# Patient Record
Sex: Female | Born: 1940 | Race: White | Hispanic: No | Marital: Married | Smoking: Former smoker
Health system: Southern US, Community
[De-identification: ages and names within clinical notes are randomized; demographics above are authoritative.]

## PROBLEM LIST (undated history)

## (undated) DIAGNOSIS — E119 Type 2 diabetes mellitus without complications: Secondary | ICD-10-CM

## (undated) DIAGNOSIS — F329 Major depressive disorder, single episode, unspecified: Secondary | ICD-10-CM

## (undated) DIAGNOSIS — R0602 Shortness of breath: Secondary | ICD-10-CM

## (undated) DIAGNOSIS — E785 Hyperlipidemia, unspecified: Secondary | ICD-10-CM

## (undated) DIAGNOSIS — R5383 Other fatigue: Secondary | ICD-10-CM

## (undated) DIAGNOSIS — I2510 Atherosclerotic heart disease of native coronary artery without angina pectoris: Secondary | ICD-10-CM

## (undated) DIAGNOSIS — J449 Chronic obstructive pulmonary disease, unspecified: Secondary | ICD-10-CM

## (undated) DIAGNOSIS — F32A Depression, unspecified: Secondary | ICD-10-CM

## (undated) DIAGNOSIS — I219 Acute myocardial infarction, unspecified: Secondary | ICD-10-CM

## (undated) DIAGNOSIS — Z720 Tobacco use: Secondary | ICD-10-CM

## (undated) DIAGNOSIS — R011 Cardiac murmur, unspecified: Secondary | ICD-10-CM

## (undated) DIAGNOSIS — I6529 Occlusion and stenosis of unspecified carotid artery: Secondary | ICD-10-CM

## (undated) DIAGNOSIS — J069 Acute upper respiratory infection, unspecified: Secondary | ICD-10-CM

## (undated) DIAGNOSIS — I10 Essential (primary) hypertension: Secondary | ICD-10-CM

## (undated) HISTORY — DX: Hyperlipidemia, unspecified: E78.5

## (undated) HISTORY — DX: Other fatigue: R53.83

## (undated) HISTORY — PX: VAGINAL HYSTERECTOMY: SUR661

## (undated) HISTORY — DX: Essential (primary) hypertension: I10

## (undated) HISTORY — DX: Tobacco use: Z72.0

## (undated) HISTORY — DX: Chronic obstructive pulmonary disease, unspecified: J44.9

## (undated) HISTORY — PX: CORONARY STENT PLACEMENT: SHX1402

## (undated) HISTORY — PX: OTHER SURGICAL HISTORY: SHX169

## (undated) HISTORY — DX: Atherosclerotic heart disease of native coronary artery without angina pectoris: I25.10

---

## 1999-11-17 ENCOUNTER — Encounter: Admission: RE | Admit: 1999-11-17 | Discharge: 1999-11-17 | Payer: Self-pay | Admitting: Internal Medicine

## 1999-11-17 ENCOUNTER — Encounter: Payer: Self-pay | Admitting: Internal Medicine

## 1999-11-29 ENCOUNTER — Ambulatory Visit (HOSPITAL_COMMUNITY): Admission: RE | Admit: 1999-11-29 | Discharge: 1999-11-29 | Payer: Self-pay | Admitting: Internal Medicine

## 2003-10-14 ENCOUNTER — Ambulatory Visit (HOSPITAL_COMMUNITY): Admission: RE | Admit: 2003-10-14 | Discharge: 2003-10-14 | Payer: Self-pay | Admitting: Internal Medicine

## 2004-07-14 ENCOUNTER — Inpatient Hospital Stay (HOSPITAL_COMMUNITY): Admission: EM | Admit: 2004-07-14 | Discharge: 2004-07-18 | Payer: Self-pay | Admitting: Emergency Medicine

## 2004-08-02 ENCOUNTER — Encounter: Admission: RE | Admit: 2004-08-02 | Discharge: 2004-08-02 | Payer: Self-pay | Admitting: Internal Medicine

## 2004-08-03 ENCOUNTER — Ambulatory Visit: Payer: Self-pay | Admitting: Cardiology

## 2004-08-16 ENCOUNTER — Ambulatory Visit: Payer: Self-pay | Admitting: Cardiology

## 2004-09-14 ENCOUNTER — Ambulatory Visit: Payer: Self-pay

## 2004-10-18 ENCOUNTER — Ambulatory Visit: Payer: Self-pay

## 2004-10-21 ENCOUNTER — Ambulatory Visit: Payer: Self-pay | Admitting: Internal Medicine

## 2004-10-22 ENCOUNTER — Ambulatory Visit: Payer: Self-pay | Admitting: Cardiology

## 2004-11-22 ENCOUNTER — Ambulatory Visit: Payer: Self-pay

## 2004-11-24 ENCOUNTER — Ambulatory Visit: Payer: Self-pay

## 2004-11-24 ENCOUNTER — Inpatient Hospital Stay (HOSPITAL_COMMUNITY): Admission: AD | Admit: 2004-11-24 | Discharge: 2004-11-26 | Payer: Self-pay | Admitting: Cardiology

## 2004-11-24 ENCOUNTER — Ambulatory Visit: Payer: Self-pay | Admitting: Cardiology

## 2004-11-25 HISTORY — PX: OTHER SURGICAL HISTORY: SHX169

## 2004-12-09 ENCOUNTER — Ambulatory Visit: Payer: Self-pay | Admitting: Internal Medicine

## 2005-01-18 ENCOUNTER — Ambulatory Visit: Payer: Self-pay | Admitting: Cardiology

## 2005-05-12 ENCOUNTER — Ambulatory Visit: Payer: Self-pay | Admitting: Internal Medicine

## 2005-06-28 ENCOUNTER — Ambulatory Visit: Payer: Self-pay | Admitting: *Deleted

## 2005-09-13 ENCOUNTER — Ambulatory Visit: Payer: Self-pay | Admitting: Internal Medicine

## 2005-09-29 ENCOUNTER — Ambulatory Visit: Payer: Self-pay | Admitting: *Deleted

## 2005-10-06 ENCOUNTER — Ambulatory Visit (HOSPITAL_COMMUNITY): Admission: RE | Admit: 2005-10-06 | Discharge: 2005-10-06 | Payer: Self-pay | Admitting: Internal Medicine

## 2006-05-02 ENCOUNTER — Encounter: Admission: RE | Admit: 2006-05-02 | Discharge: 2006-05-02 | Payer: Self-pay | Admitting: Internal Medicine

## 2006-08-11 ENCOUNTER — Ambulatory Visit: Payer: Self-pay | Admitting: Internal Medicine

## 2006-08-17 ENCOUNTER — Ambulatory Visit: Payer: Self-pay | Admitting: Internal Medicine

## 2006-09-08 ENCOUNTER — Ambulatory Visit: Payer: Self-pay | Admitting: Internal Medicine

## 2006-10-09 ENCOUNTER — Ambulatory Visit (HOSPITAL_COMMUNITY): Admission: RE | Admit: 2006-10-09 | Discharge: 2006-10-09 | Payer: Self-pay | Admitting: Internal Medicine

## 2006-10-09 ENCOUNTER — Ambulatory Visit: Payer: Self-pay | Admitting: Internal Medicine

## 2006-10-18 ENCOUNTER — Encounter: Admission: RE | Admit: 2006-10-18 | Discharge: 2006-10-18 | Payer: Self-pay | Admitting: Internal Medicine

## 2007-02-27 ENCOUNTER — Ambulatory Visit: Payer: Self-pay | Admitting: Internal Medicine

## 2007-05-22 ENCOUNTER — Encounter: Admission: RE | Admit: 2007-05-22 | Discharge: 2007-05-22 | Payer: Self-pay | Admitting: Internal Medicine

## 2007-08-07 ENCOUNTER — Ambulatory Visit: Payer: Self-pay | Admitting: Internal Medicine

## 2007-09-14 ENCOUNTER — Ambulatory Visit: Payer: Self-pay

## 2007-09-14 ENCOUNTER — Encounter: Payer: Self-pay | Admitting: Internal Medicine

## 2007-10-31 ENCOUNTER — Ambulatory Visit (HOSPITAL_COMMUNITY): Admission: RE | Admit: 2007-10-31 | Discharge: 2007-10-31 | Payer: Self-pay | Admitting: Internal Medicine

## 2008-06-06 ENCOUNTER — Ambulatory Visit: Payer: Self-pay | Admitting: Internal Medicine

## 2008-11-13 ENCOUNTER — Ambulatory Visit (HOSPITAL_COMMUNITY): Admission: RE | Admit: 2008-11-13 | Discharge: 2008-11-13 | Payer: Self-pay | Admitting: Internal Medicine

## 2009-04-07 ENCOUNTER — Encounter: Payer: Self-pay | Admitting: Internal Medicine

## 2009-04-09 DIAGNOSIS — F17201 Nicotine dependence, unspecified, in remission: Secondary | ICD-10-CM

## 2009-04-09 DIAGNOSIS — J449 Chronic obstructive pulmonary disease, unspecified: Secondary | ICD-10-CM | POA: Insufficient documentation

## 2009-04-09 DIAGNOSIS — E785 Hyperlipidemia, unspecified: Secondary | ICD-10-CM

## 2009-04-09 DIAGNOSIS — I10 Essential (primary) hypertension: Secondary | ICD-10-CM

## 2009-04-09 DIAGNOSIS — I2510 Atherosclerotic heart disease of native coronary artery without angina pectoris: Secondary | ICD-10-CM

## 2009-04-10 ENCOUNTER — Ambulatory Visit: Payer: Self-pay | Admitting: Cardiology

## 2009-04-10 ENCOUNTER — Encounter: Payer: Self-pay | Admitting: Nurse Practitioner

## 2009-04-10 DIAGNOSIS — R5383 Other fatigue: Secondary | ICD-10-CM

## 2009-04-10 DIAGNOSIS — R5381 Other malaise: Secondary | ICD-10-CM

## 2009-04-10 DIAGNOSIS — R0602 Shortness of breath: Secondary | ICD-10-CM

## 2009-04-15 ENCOUNTER — Telehealth (INDEPENDENT_AMBULATORY_CARE_PROVIDER_SITE_OTHER): Payer: Self-pay

## 2009-04-16 ENCOUNTER — Encounter: Payer: Self-pay | Admitting: Internal Medicine

## 2009-04-16 ENCOUNTER — Ambulatory Visit: Payer: Self-pay

## 2009-04-16 LAB — CONVERTED CEMR LAB
Basophils Absolute: 0 10*3/uL (ref 0.0–0.1)
CO2: 33 meq/L — ABNORMAL HIGH (ref 19–32)
Chloride: 100 meq/L (ref 96–112)
Eosinophils Absolute: 0.1 10*3/uL (ref 0.0–0.7)
GFR calc non Af Amer: 76.38 mL/min (ref >60)
Glucose, Bld: 148 mg/dL — ABNORMAL HIGH (ref 70–99)
Hemoglobin: 16 g/dL — ABNORMAL HIGH (ref 12.0–15.0)
Lymphocytes Relative: 27.7 % (ref 12.0–46.0)
Lymphs Abs: 2.1 10*3/uL (ref 0.7–4.0)
Neutro Abs: 4.6 10*3/uL (ref 1.4–7.7)
Neutrophils Relative %: 60.8 % (ref 43.0–77.0)
Platelets: 185 10*3/uL (ref 150.0–400.0)
Potassium: 4.3 meq/L (ref 3.5–5.1)
RDW: 11.5 % (ref 11.5–14.6)
TSH: 1.1 microintl units/mL (ref 0.35–5.50)
WBC: 7.5 10*3/uL (ref 4.5–10.5)

## 2009-04-27 ENCOUNTER — Encounter: Admission: RE | Admit: 2009-04-27 | Discharge: 2009-04-27 | Payer: Self-pay | Admitting: Internal Medicine

## 2009-05-05 ENCOUNTER — Telehealth: Payer: Self-pay | Admitting: Internal Medicine

## 2009-06-15 ENCOUNTER — Ambulatory Visit: Payer: Self-pay | Admitting: Internal Medicine

## 2009-07-27 ENCOUNTER — Telehealth: Payer: Self-pay | Admitting: Internal Medicine

## 2009-09-22 ENCOUNTER — Encounter: Payer: Self-pay | Admitting: Internal Medicine

## 2009-09-22 DIAGNOSIS — I6529 Occlusion and stenosis of unspecified carotid artery: Secondary | ICD-10-CM

## 2009-09-23 ENCOUNTER — Encounter: Payer: Self-pay | Admitting: Cardiovascular Disease

## 2009-09-23 ENCOUNTER — Ambulatory Visit: Payer: Self-pay | Admitting: Internal Medicine

## 2009-09-24 ENCOUNTER — Encounter: Admission: RE | Admit: 2009-09-24 | Discharge: 2009-09-24 | Payer: Self-pay | Admitting: Internal Medicine

## 2009-10-03 HISTORY — PX: CARDIAC CATHETERIZATION: SHX172

## 2009-10-08 ENCOUNTER — Encounter: Admission: RE | Admit: 2009-10-08 | Discharge: 2009-10-08 | Payer: Self-pay | Admitting: Internal Medicine

## 2009-11-27 ENCOUNTER — Telehealth (INDEPENDENT_AMBULATORY_CARE_PROVIDER_SITE_OTHER): Payer: Self-pay | Admitting: *Deleted

## 2009-12-23 ENCOUNTER — Ambulatory Visit (HOSPITAL_COMMUNITY): Admission: RE | Admit: 2009-12-23 | Discharge: 2009-12-23 | Payer: Self-pay | Admitting: Internal Medicine

## 2010-03-12 ENCOUNTER — Telehealth (INDEPENDENT_AMBULATORY_CARE_PROVIDER_SITE_OTHER): Payer: Self-pay | Admitting: *Deleted

## 2010-04-22 ENCOUNTER — Encounter: Admission: RE | Admit: 2010-04-22 | Discharge: 2010-04-22 | Payer: Self-pay | Admitting: Neurosurgery

## 2010-07-06 ENCOUNTER — Ambulatory Visit: Payer: Self-pay | Admitting: Internal Medicine

## 2010-07-06 DIAGNOSIS — R072 Precordial pain: Secondary | ICD-10-CM

## 2010-07-07 LAB — CONVERTED CEMR LAB
Basophils Absolute: 0 10*3/uL (ref 0.0–0.1)
Basophils Relative: 0.4 % (ref 0.0–3.0)
CO2: 33 meq/L — ABNORMAL HIGH (ref 19–32)
Calcium: 9.9 mg/dL (ref 8.4–10.5)
Chloride: 104 meq/L (ref 96–112)
Eosinophils Relative: 2 % (ref 0.0–5.0)
HCT: 42.3 % (ref 36.0–46.0)
Hemoglobin: 14.6 g/dL (ref 12.0–15.0)
INR: 0.9 (ref 0.8–1.0)
Lymphocytes Relative: 27.2 % (ref 12.0–46.0)
MCV: 100.7 fL — ABNORMAL HIGH (ref 78.0–100.0)
Monocytes Relative: 7.5 % (ref 3.0–12.0)
Platelets: 216 10*3/uL (ref 150.0–400.0)
Prothrombin Time: 9.5 s — ABNORMAL LOW (ref 9.7–11.8)
Sodium: 144 meq/L (ref 135–145)

## 2010-07-09 ENCOUNTER — Ambulatory Visit: Payer: Self-pay | Admitting: Internal Medicine

## 2010-07-09 ENCOUNTER — Inpatient Hospital Stay (HOSPITAL_BASED_OUTPATIENT_CLINIC_OR_DEPARTMENT_OTHER): Admission: RE | Admit: 2010-07-09 | Discharge: 2010-07-09 | Payer: Self-pay | Admitting: Internal Medicine

## 2010-07-20 ENCOUNTER — Telehealth: Payer: Self-pay | Admitting: Internal Medicine

## 2010-07-27 ENCOUNTER — Encounter: Payer: Self-pay | Admitting: Physician Assistant

## 2010-07-28 ENCOUNTER — Ambulatory Visit: Payer: Self-pay | Admitting: Internal Medicine

## 2010-08-23 ENCOUNTER — Ambulatory Visit: Payer: Self-pay | Admitting: Internal Medicine

## 2010-09-06 ENCOUNTER — Ambulatory Visit: Payer: Self-pay | Admitting: Internal Medicine

## 2010-09-06 LAB — PULMONARY FUNCTION TEST

## 2010-09-13 ENCOUNTER — Telehealth (INDEPENDENT_AMBULATORY_CARE_PROVIDER_SITE_OTHER): Payer: Self-pay | Admitting: *Deleted

## 2010-09-23 ENCOUNTER — Ambulatory Visit: Payer: Self-pay | Admitting: Internal Medicine

## 2010-10-02 ENCOUNTER — Telehealth: Payer: Self-pay | Admitting: Internal Medicine

## 2010-10-02 ENCOUNTER — Encounter: Payer: Self-pay | Admitting: Internal Medicine

## 2010-10-02 ENCOUNTER — Encounter: Payer: Self-pay | Admitting: Critical Care Medicine

## 2010-10-06 ENCOUNTER — Telehealth (INDEPENDENT_AMBULATORY_CARE_PROVIDER_SITE_OTHER): Payer: Self-pay | Admitting: *Deleted

## 2010-10-07 ENCOUNTER — Ambulatory Visit
Admission: RE | Admit: 2010-10-07 | Discharge: 2010-10-07 | Payer: Self-pay | Source: Home / Self Care | Attending: Internal Medicine | Admitting: Internal Medicine

## 2010-10-14 ENCOUNTER — Ambulatory Visit
Admission: RE | Admit: 2010-10-14 | Discharge: 2010-10-14 | Payer: Self-pay | Source: Home / Self Care | Attending: Internal Medicine | Admitting: Internal Medicine

## 2010-10-20 ENCOUNTER — Ambulatory Visit: Admission: RE | Admit: 2010-10-20 | Discharge: 2010-10-20 | Payer: Self-pay | Source: Home / Self Care

## 2010-10-20 ENCOUNTER — Encounter: Payer: Self-pay | Admitting: Internal Medicine

## 2010-10-23 ENCOUNTER — Encounter: Payer: Self-pay | Admitting: Internal Medicine

## 2010-10-26 ENCOUNTER — Encounter: Payer: Self-pay | Admitting: Internal Medicine

## 2010-11-02 NOTE — Letter (Signed)
Summary: Cardiac Catheterization Instructions- [facility]  [employer], Main Office  [address]   [place], [place] [address]   Phone: [phone]  Fax: [phone]     07/06/2010 MRN: [id number]  [facility] [address] [place], [place]  [address]  Dear Ms. Tomasso,   You are scheduled for a Cardiac Catheterization on Friday 07/09/10 with Dr. Gala Romney  Please arrive to the 1st floor of the Heart and Vascular Center at [facility] at 11:30 am / pm on the day of your procedure. Please do not arrive before 6:30 a.m. Call the Heart and Vascular Center at [phone] if you are unable to make your appointmnet. The Code to get into the parking garage under the building is 0200. Take the elevators to the 1st floor. You must have someone to drive you home. Someone must be with you for the first 24 hours after you arrive home. Please wear clothes that are easy to get on and off and wear slip-on shoes. Do not eat or drink after midnight except water with your medications that morning. Bring all your medications and current insurance cards with you.  _X__ DO NOT take these medications before your procedure:  Do not take Metformin day of cath and for 2 days after  ___ Make sure you take your aspirin.  ___ You may take ALL of your medications with water that morning. ________________________________________________________________________________________________________________________________  ___ DO NOT take ANY medications before your procedure.  ___ Pre-med instructions:  ________________________________________________________________________________________________________________________________  The usual length of stay after your procedure is 2 to 3 hours. This can vary.  If you have any questions, please call the office at the number listed above.   Meredith Staggers, RN

## 2010-11-02 NOTE — Progress Notes (Signed)
   Phone Note Other Incoming   Summary of Call: Release form sent to [employer]. Form sent from [employer]. Initial call taken by: Dixie Dials

## 2010-11-02 NOTE — Assessment & Plan Note (Signed)
Summary: eph.post cath.gd    Visit Type:  Follow-up Primary Provider:  Dr Renae Gloss   History of Present Illness: This is a 70 year old white female patient who underwent cardiac catheterization for further evaluation of some chest pain and back pain that she's been having to rule out anginal equivalent. Cardiac catheterization on July 09, 2010 showed patent stents to the LAD and RCA with nonobstructive disease throughout. It was felt the majority of her symptoms were probably due to COPD and she was counseled to stop smoking. We'll also consider getting pulmonary function tests and a CT scan of her chest to further evaluate. She was sent home on Imdur. The patient said she became extremely dizzy after she took one dose and had to stop it. She said she still continues to have some back aching and becomes completely exhausted after taking a shower and doing her hair. She continues to smoke a half a pack of cigarettes a day. She gets frustrated for not being able to quit. She denies any anterior chest pain, palpitations, dyspnea, dizziness since she's been off them nor, or presyncope. She does have a chronic cough and dyspnea on exertion related to her smoking.  Current Medications (verified): 1)  Vytorin 10-40 Mg Tabs (Ezetimibe-Simvastatin) .... Take One Tablet By Mouth Dailyat Bedtime 2)  Metoprolol Succinate 25 Mg Xr24h-Tab (Metoprolol Succinate) .... Take One-Half Tablet By Mouth Daily As Needed 3)  Plavix 75 Mg Tabs (Clopidogrel Bisulfate) .... Take One Tablet By Mouth Daily 4)  Aspirin 81 Mg Tbec (Aspirin) .... Take One Tablet By Mouth Daily 5)  Spiriva Handihaler 18 Mcg Caps (Tiotropium Bromide Monohydrate) .... Uad 6)  Klonopin 0.5 Mg Tabs (Clonazepam) .... Take One Tablet By Mouth Once Daily. 7)  Nitrostat 0.4 Mg Subl (Nitroglycerin) .Marland Kitchen.. 1 Tab As Needed For Chest Pain 8)  Metformin Hcl 500 Mg Tabs (Metformin Hcl) .... Take 1 Tablet By Mouth Once A Day 9)  Advair Diskus 250-50 Mcg/dose  Aepb (Fluticasone-Salmeterol) .... As Needed  Allergies: 1)  ! Codeine  Past History:  Past Medical History: Last updated: 04/10/2009 CAD (ICD-414.00)      a. 07/2004 - NSTEMI - DES TO RCA      b. 11/2004 - DES TO LAD HYPERTENSION (ICD-401.9) HYPERLIPIDEMIA (ICD-272.4) COPD (ICD-496) ONGOING TOBACCO ABUSE (ICD-305.1) FATIGUE  Past Surgical History: Last updated: 04/09/2009  Drug-eluting stent placement in the mid right coronary artery. Hysterectomy  Coronary angiography/CYPHER stenting, left anterior descending, November 25, 2004.  Review of Systems       see the history of present illness  Vital Signs:  Patient profile:   70 year old female Height:      61 inches Weight:      117 pounds Pulse rate:   76 / minute Pulse rhythm:   regular BP sitting:   122 / 78  (left arm)  Physical Exam  General:   Well-nournished, in no acute distress. Neck: No JVD, HJR, Bruit, or thyroid enlargement Lungs: Decreased BS throughout,No tachypnea, clear without wheezing, rales, or rhonchi Cardiovascular: RRR, PMI not displaced, heart sounds normal, no murmurs, gallops, bruit, thrill, or heave. Abdomen: BS normal. Soft without organomegaly, masses, lesions or tenderness. Extremities: right carotid hematoma or hemorrhage, lower extremities without cyanosis, clubbing or edema. Good distal pulses bilateral SKin: Warm, no lesions or rashes  Musculoskeletal: No deformities Neuro: no focal signs    Impression & Recommendations:  Problem # 1:  CHEST PAIN, PRECORDIAL (ICD-786.51) Patient continues to have some pain in her back and  exhaustion when she over exerts herself. Difficult to discern whether this is an anginal colon or not. Cardiac catheter showed patent stents and nonobstructive disease ejection fraction 45%. We recommend she see her pulmonologist Dr. Jetty Duhamel for further workup with pulmonary function tests and CT scan. The following medications were removed from the  medication list:    Isosorbide Mononitrate Cr 30 Mg Xr24h-tab (Isosorbide mononitrate) .Marland Kitchen... Take one tablet by mouth daily Her updated medication list for this problem includes:    Metoprolol Succinate 25 Mg Xr24h-tab (Metoprolol succinate) .Marland Kitchen... Take one-half tablet by mouth daily as needed    Plavix 75 Mg Tabs (Clopidogrel bisulfate) .Marland Kitchen... Take one tablet by mouth daily    Aspirin 81 Mg Tbec (Aspirin) .Marland Kitchen... Take one tablet by mouth daily    Nitrostat 0.4 Mg Subl (Nitroglycerin) .Marland Kitchen... 1 tab as needed for chest pain  Problem # 2:  CAD (ICD-414.00) stable the above dictation and history of present illness The following medications were removed from the medication list:    Isosorbide Mononitrate Cr 30 Mg Xr24h-tab (Isosorbide mononitrate) .Marland Kitchen... Take one tablet by mouth daily Her updated medication list for this problem includes:    Metoprolol Succinate 25 Mg Xr24h-tab (Metoprolol succinate) .Marland Kitchen... Take one-half tablet by mouth daily as needed    Plavix 75 Mg Tabs (Clopidogrel bisulfate) .Marland Kitchen... Take one tablet by mouth daily    Aspirin 81 Mg Tbec (Aspirin) .Marland Kitchen... Take one tablet by mouth daily    Nitrostat 0.4 Mg Subl (Nitroglycerin) .Marland Kitchen... 1 tab as needed for chest pain  Problem # 3:  HYPERTENSION (ICD-401.9) stable Her updated medication list for this problem includes:    Metoprolol Succinate 25 Mg Xr24h-tab (Metoprolol succinate) .Marland Kitchen... Take one-half tablet by mouth daily as needed    Aspirin 81 Mg Tbec (Aspirin) .Marland Kitchen... Take one tablet by mouth daily  Problem # 4:  TOBACCO ABUSE (ICD-305.1) I counseled the patient on the importance of smoking cessation. Patient is down to half pack of cigarettes a day.  Problem # 5:  COPD (ICD-496) Follow up with Dr. Maple Hudson. Her updated medication list for this problem includes:    Spiriva Handihaler 18 Mcg Caps (Tiotropium bromide monohydrate) ..... Uad    Advair Diskus 250-50 Mcg/dose Aepb (Fluticasone-salmeterol) .Marland Kitchen... As needed  Other Orders: Misc.  Referral (Misc. Ref)  Patient Instructions: 1)  Your physician recommends that you schedule a follow-up appointment in: 2 months with Dr Gala Romney. 2)  Referral to Dr Fannie Knee.

## 2010-11-02 NOTE — Progress Notes (Signed)
Summary: pt needs refill   Phone Note Refill Request Message from:  [facility] [address]  Refills Requested: Medication #1:  PLAVIX 75 MG TABS Take one tablet by mouth daily pt has 2pills  Initial call taken by: Omer Jack,  November 27, 2009 11:20 AM  Follow-up for Phone Call        Rx faxed to pharmacy 90 x1 refill Follow-up by: Oswald Hillock,  November 27, 2009 1:00 PM  Additional Follow-up for Phone Call Additional follow up Details #1::        Rx faxed to pharmacy    Prescriptions: PLAVIX 75 MG TABS (CLOPIDOGREL BISULFATE) Take one tablet by mouth daily  #90 x 1   Entered by:   Oswald Hillock   Authorized by:   Dolores Patty, MD, [facility]   Signed by:   Oswald Hillock on 11/27/2009   Method used:   [facility]  [address] [id number]       [address]       [place], [place]  [address]       Ph: [phone] or [phone]       Fax: [phone]   RxID:   [id number]

## 2010-11-02 NOTE — Miscellaneous (Signed)
Summary: Orders Update pft charges  Clinical Lists Changes  Orders: Added new Service order of Carbon Monoxide diffusing w/capacity (94720) - Signed Added new Service order of Lung Volumes (94240) - Signed Added new Service order of Spirometry (Pre & Post) (94060) - Signed 

## 2010-11-02 NOTE — Miscellaneous (Signed)
  Clinical Lists Changes  Observations: Added new observation of CARDCATHFIND:  Right coronary artery was a dominant vessel, gave off PDA and a small   posterolateral.  There was a 40% lesion proximally followed by an area   of stenting.  There was just mild luminal plaquing in the stent. Through   the stent, there was a 40% lesion in the midsection.  There was diffuse   30% plaquing in the PDA.      Left ventriculogram done in the RAO position showed an EF of 40-45% with   akinesis of the basilar to mid inferior wall.  No significant mitral   regurgitation.      ASSESSMENT:   1. Coronary artery disease with patent stents as described above.   2. Left ventricular ejection fraction approximately 45%.      PLAN/DISCUSSION:  I suspect the majority of her symptoms are due to her   COPD.  I have counseled her strongly about the need to stop smoking.  We   will consider getting PFTs and a CT scan of the chest to further   evaluate.  We will treat her coronary disease medically.               [name], MD  (07/10/2010 9:49)      Cardiac Cath  Procedure date:  07/10/2010  Findings:       Right coronary artery was a dominant vessel, gave off PDA and a small   posterolateral.  There was a 40% lesion proximally followed by an area   of stenting.  There was just mild luminal plaquing in the stent. Through   the stent, there was a 40% lesion in the midsection.  There was diffuse   30% plaquing in the PDA.      Left ventriculogram done in the RAO position showed an EF of 40-45% with   akinesis of the basilar to mid inferior wall.  No significant mitral   regurgitation.      ASSESSMENT:   1. Coronary artery disease with patent stents as described above.   2. Left ventricular ejection fraction approximately 45%.      PLAN/DISCUSSION:  I suspect the majority of her symptoms are due to her   COPD.  I have counseled her strongly about the need to stop smoking.  We   will  consider getting PFTs and a CT scan of the chest to further   evaluate.  We will treat her coronary disease medically.               [name], MD

## 2010-11-02 NOTE — Assessment & Plan Note (Signed)
Summary: f1y  Medications Added METFORMIN HCL 500 MG TABS (METFORMIN HCL) Take 1 tablet by mouth once a day ADVAIR DISKUS 250-50 MCG/DOSE AEPB (FLUTICASONE-SALMETEROL) as needed ISOSORBIDE MONONITRATE CR 30 MG XR24H-TAB (ISOSORBIDE MONONITRATE) Take one tablet by mouth daily      Allergies Added:   Visit Type:  Follow-up Primary Provider:  Dr Renae Gloss  CC:  pain between shoulder blades - relief with NTG.  History of Present Illness: [name] is 70 year old Caucasian female with prior history of HTN, HL, COPD and CAD status post stenting of right coronary artery in 2005 and LAD in 2006.  Was seen in July 2010 by Ward Givens and was having epigastric discomfort radiating to scapula. Had Myoview EF 55% with normal perfusion.  Returns for f/u.  Continues to have pain between her shoulder blades. Can happen at any time. Feels like it is more frequent. Not worse with exertion, though. Has now had to take NTG 2x and it has helped. Does notice that she is getting more short ot breath with activity.   Still smoking 3/4 ppd.  Current Medications (verified): 1)  Vytorin 10-40 Mg Tabs (Ezetimibe-Simvastatin) .... Take One Tablet By Mouth Dailyat Bedtime 2)  Metoprolol Succinate 25 Mg Xr24h-Tab (Metoprolol Succinate) .... Take One-Half Tablet By Mouth Daily As Needed 3)  Plavix 75 Mg Tabs (Clopidogrel Bisulfate) .... Take One Tablet By Mouth Daily 4)  Aspirin 81 Mg Tbec (Aspirin) .... Take One Tablet By Mouth Daily 5)  Spiriva Handihaler 18 Mcg Caps (Tiotropium Bromide Monohydrate) .... Uad 6)  Klonopin 0.5 Mg Tabs (Clonazepam) .... Take One Tablet By Mouth Once Daily. 7)  Nitrostat 0.4 Mg Subl (Nitroglycerin) .Marland Kitchen.. 1 Tab As Needed For Chest Pain 8)  Metformin Hcl 500 Mg Tabs (Metformin Hcl) .... Take 1 Tablet By Mouth Once A Day 9)  Advair Diskus 250-50 Mcg/dose Aepb (Fluticasone-Salmeterol) .... As Needed  Allergies (verified): 1)  ! Codeine  Past History:  Past Medical History: Last  updated: 04/10/2009 CAD (ICD-414.00)      a. 07/2004 - NSTEMI - DES TO RCA      b. 11/2004 - DES TO LAD HYPERTENSION (ICD-401.9) HYPERLIPIDEMIA (ICD-272.4) COPD (ICD-496) ONGOING TOBACCO ABUSE (ICD-305.1) FATIGUE  Review of Systems       As per HPI and past medical history; otherwise all systems negative.   Vital Signs:  Patient profile:   70 year old female Height:      61 inches Weight:      118 pounds BMI:     22.38 Pulse rate:   71 / minute BP sitting:   120 / 78  (left arm) Cuff size:   regular  Vitals Entered By: Hardin Negus, RMA (July 06, 2010 11:43 AM)  Physical Exam  General:  Looks like a chronic smoker. + coughno acute distress. no resp difficulty HEENT: normal Neck: supple. no JVD. Carotids 2+ bilat; no  bruits. No lymphadenopathy or thryomegaly appreciated. Cor: PMI nonpalpable. Distant Regular rate & rhythm. No rubs, gallops, murmur. Lungs: clear  with decreased air movement Abdomen: soft, nontender, nondistended. Good bowel sounds. Extremities: no cyanosis, clubbing, rash, edema Neuro: alert & orientedx3, cranial nerves grossly intact. moves all 4 extremities w/o difficulty. affect pleasant    Impression & Recommendations:  Problem # 1:  CAD (ICD-414.00) Recurrent CP concerning for angina. Now 5 years out from previous cath and still smoking so at high risk. We discussed repeart stress test vs cath. I have suggested cath and she agrees to proceed. She  will d/w her husband. Start Imdur 30. Call 911 if symptoms getting worse.  Problem # 2:  TOBACCO ABUSE (ICD-305.1) Counseled on need to stop smoking.   Other Orders: EKG w/ Interpretation (93000) Cardiac Catheterization (Cardiac Cath) TLB-BMP (Basic Metabolic Panel-BMET) (80048-METABOL) TLB-CBC Platelet - w/Differential (85025-CBCD) TLB-PT (Protime) (85610-PTP)  Patient Instructions: 1)  Labs today 2)  Imdur 30mg  daily 3)  Your physician has requested that you have a cardiac catheterization.   Cardiac catheterization is used to diagnose and/or treat various heart conditions. Doctors may recommend this procedure for a number of different reasons. The most common reason is to evaluate chest pain. Chest pain can be a symptom of coronary artery disease (CAD), and cardiac catheterization can show whether plaque is narrowing or blocking your heart's arteries. This procedure is also used to evaluate the valves, as well as measure the blood flow and oxygen levels in different parts of your heart.  For further information please visit [email or web address]  Please follow instruction sheet, as given. 4)  Your physician wants you to follow-up in:  6 months.  You will receive a reminder letter in the mail two months in advance. If you don't receive a letter, please call our office to schedule the follow-up appointment. Prescriptions: ISOSORBIDE MONONITRATE CR 30 MG XR24H-TAB (ISOSORBIDE MONONITRATE) Take one tablet by mouth daily  #30 x 6   Entered by:   Meredith Staggers, RN   Authorized by:   Dolores Patty, MD, [facility]   Signed by:   Meredith Staggers, RN on 07/06/2010   Method used:   [facility]  [address] [id number]       [address]       [place], [place]  [address]       Ph: [phone] or [phone]       Fax: [phone]   RxID:   [id number]

## 2010-11-02 NOTE — Assessment & Plan Note (Signed)
Summary: SOB//jwr   Primary [name]/Referring Slyvia Lartigue:  Dr Andi Devon  CC:  Pulmonary Consult pt c/o sob with exertion, pain in Left lower lobe, symptons x 10 months , and cardiac cath 07/09/10.  History of Present Illness: August 23, 2010- Nurse CC: Pulmonary Consult pt c/o sob with exertion, pain in Left lower lobe, symptoms x 10 months , cardiac cath 07/09/10. I had seen her at the old office around 2004 for dx of COPD and qualified her for disability then.  70yoF seen on kind referral by Dr Gala Romney for COPD. Smoker. Says she has had notable shortness of breath and cough since 1998, bu no oxygen and never hospitalised for her lungs. Chest tightness and cough vary with weather. Occasional sputum. sometimes cough wakes her. Never pneumonia. Hadd pneumovax x 2, flu vax. Dyspnea maybe at one block level, less than 1 flight of stairs. Spring and Fall seasons associated with nasal congestion. Denies blood, palpitation. Gets nonexertional pains in left lower lateral chest wall. Still smoking 1/2-1 PPD. Failed nicotine patches and Welbutrin, afraid to try Chantix. Cried when she quit smoking for 4 months once in the past- made emotions labile.  Hx coronary disease with 2 stents.      Preventive Screening-Counseling & Management  Alcohol-Tobacco     Smoking Status: current     Smoking Cessation Counseling: yes     Smoke Cessation Stage: precontemplative     Packs/Day: 1.0     Year Started: 1953     Tobacco Counseling: to quit use of tobacco products  Current Medications (verified): 1)  Vytorin 10-40 Mg Tabs (Ezetimibe-Simvastatin) .... Take One Tablet By Mouth Dailyat Bedtime 2)  Metoprolol Succinate 25 Mg Xr24h-Tab (Metoprolol Succinate) .... Take One-Half Tablet By Mouth Daily As Needed 3)  Plavix 75 Mg Tabs (Clopidogrel Bisulfate) .... Take One Tablet By Mouth Daily 4)  Aspirin 81 Mg Tbec (Aspirin) .... Take One Tablet By Mouth Daily 5)  Spiriva Handihaler 18 Mcg Caps  (Tiotropium Bromide Monohydrate) .... Uad 6)  Klonopin 0.5 Mg Tabs (Clonazepam) .... Take One Tablet By Mouth Once Daily. 7)  Nitrostat 0.4 Mg Subl (Nitroglycerin) .Marland Kitchen.. 1 Tab As Needed For Chest Pain 8)  Metformin Hcl 500 Mg Tabs (Metformin Hcl) .... Take 1 Tablet By Mouth Once A Day 9)  Advair Diskus 250-50 Mcg/dose Aepb (Fluticasone-Salmeterol) .... As Needed  Allergies (verified): 1)  ! Codeine  Past History:  Past Medical History: Last updated: 04/10/2009 CAD (ICD-414.00)      a. 07/2004 - NSTEMI - DES TO RCA      b. 11/2004 - DES TO LAD HYPERTENSION (ICD-401.9) HYPERLIPIDEMIA (ICD-272.4) COPD (ICD-496) ONGOING TOBACCO ABUSE (ICD-305.1) FATIGUE  Family History: Last updated: 08/23/2010  Mother had diabetes, and may have died of a heart attack age 31. Father - died 40 yo old age, Alzheimers  Social History: Last updated: 08/23/2010  She is married.  She started smoking at age 70.  She cont. to smoke about 10 cigarettes/day.  She occasionally drinks wine.  She denies any recreational drugs. 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 Hysterectomy Coronary angiography/CYPHER stenting, left anterior descending, November 25, 2004.  Family History:  Mother had diabetes, and may have died of a heart attack age 84. Father - died 75 yo old age, Alzheimers  Social History:  She is married.  She started smoking  at age 32.  She cont. to smoke about 10 cigarettes/day.  She occasionally drinks wine.  She denies any recreational drugs. 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  The patient denies  shortness of breath at rest, non-productive cough, coughing up blood, chest pain, irregular heartbeats, acid heartburn, indigestion, loss of appetite, weight change, abdominal pain, difficulty swallowing, sore throat, tooth/dental problems, headaches, nasal congestion/difficulty breathing through nose, sneezing, itching, ear ache, anxiety, hand/feet swelling, joint stiffness or pain, rash, change in color of mucus, and fever.    Vital Signs:  Patient profile:   70 year old female Height:      61 inches Weight:      116 pounds BMI:     22.00 O2 Sat:      90 % on Room air Pulse rate:   80 / minute BP sitting:   140 / 80  (left arm)  Vitals Entered By: Renold Genta RCP, LPN (August 23, 2010 2:44 PM)  O2 Flow:  Room air CC: Pulmonary Consult pt c/o sob with exertion, pain in Left lower lobe, symptons x 10 months , cardiac cath 07/09/10 Comments Medications reviewed with patient Renold Genta RCP, LPN  August 23, 2010 2:44 PM    Physical Exam  Additional Exam:  General: A/Ox3; pleasant and cooperative, NAD, trim SKIN: no rash, lesions NODES: no lymphadenopathy HEENT: [place]/AT, EOM- WNL, Conjuctivae- clear, PERRLA, TM-WNL, Nose- clear, Throat- clear and wnl, dentures, Mallampati  II NECK: Supple w/ fair ROM, JVD- none, normal carotid impulses w/o bruits Thyroid- normal to palpation CHEST: diminished, slow. 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  Plan Pulmonary rehab Add rescue inhaler PFT Get report latest CXR [facility]- consider need for CT  Problem # 2:  TOBACCO ABUSE (ICD-305.1)  Smoking counseling, [facility] smoking cessation program referral  Medications Added to Medication List This Visit: 1)   Proair Hfa 108 (90 Base) Mcg/act Aers (Albuterol sulfate) .... 2 puffs  four times a day as needed rescue inhaler  Other Orders: Consultation Level IV (81191) Misc. Referral (Misc. Ref) Rehabilitation Referral (Rehab)  Patient Instructions: 1)  Please schedule a follow-up appointment in 1 month. 2)  Information on Chantix and the [facility] Smoking Cessation program 3)  See [facility] to schedule PFT and for referral information to the Pulmonary Rehab program 4)  Sample and script for Proair rescue inhaler- 5)    2 puffs four times a day as needed  6)  We will track down report of your latest CXR at [facility] Prescriptions: PROAIR HFA 108 (90 BASE) MCG/ACT AERS (ALBUTEROL SULFATE) 2 puffs  four times a day as needed rescue inhaler  #1 x prn   Entered and Authorized by:   Waymon Budge MD   Signed by:   Waymon Budge MD on 08/23/2010   Method used:   Print then Give to Patient   RxID:   [id number]

## 2010-11-02 NOTE — Progress Notes (Signed)
Summary: question on meds   Phone Note Call from Patient Call back at Home Phone [phone] Call back at cell-[phone]   Caller: Patient Reason for Call: Talk to Nurse Summary of Call: pt has question on meds on ISOSORBIDE MONONITRATE CR 30 MG. pt states she getting dizzy when she take the medication. pt is going out of  town tomorrow morning. Initial call taken by: Roe Coombs,  July 20, 2010 2:52 PM  Follow-up for Phone Call        pt can't tolerate Imdur, she says she just feels weird adn dizzy this has been going on since starting the med, she will stop and see how she does, will let Dr Gala Romney know Meredith Staggers, RN  July 20, 2010 5:34 PM

## 2010-11-04 NOTE — Assessment & Plan Note (Signed)
Summary: 2 MONTH/D.MILLER      Allergies Added:   Visit Type:  Follow-up Primary Provider:  Dr Andi Devon   History of Present Illness: [name] is 70 year old female with prior history of HTN, HL, COPD and CAD status post stenting of right coronary artery in 2005 and LAD in 2006.  Underwent cardiac catheterization on July 09, 2010 due to CP, showed patent stents to the LAD and RCA with nonobstructive disease throughout. It was felt the majority of her symptoms were probably due to COPD and she was counseled to stop smoking.  She was sent home on Imdur. The patient said she became extremely dizzy after she took one dose and had to stop it. Referred to pulmonary.  Saw Dr. Maple Hudson. PFTs with seveer COPD:  FEV1 0.59/ 24%. R 0.28. FVC improved some after BD. - 09/06/10- Room Air: 89%, 86%, 92% 288 meters. Dr. Maple Hudson working with her to try to get her to quit smoking. Started on home O2. But only using at night and not with exertion.   Here for f/u. Has been sick with flu recently and starting to get better. Down to smoking about 1 cigarette per day. Continues with a chronic cough and dyspnea on exertion related to her smoking. No significant CP. No HF.   Would like to stop Plavix due to bruising.   Carotid u/s 12/10: R 40-59% L 0-39%. No focal neuro symptoms.   Current Medications (verified): 1)  Vytorin 10-40 Mg Tabs (Ezetimibe-Simvastatin) .... Take One Tablet By Mouth Dailyat Bedtime 2)  Metoprolol Succinate 25 Mg Xr24h-Tab (Metoprolol Succinate) .... Take 1 Tablet By Mouth Once A Day 3)  Plavix 75 Mg Tabs (Clopidogrel Bisulfate) .... Take One Tablet By Mouth Daily 4)  Aspirin 81 Mg Tbec (Aspirin) .... Take One Tablet By Mouth Daily 5)  Spiriva Handihaler 18 Mcg Caps (Tiotropium Bromide Monohydrate) .... Uad 6)  Klonopin 0.5 Mg Tabs (Clonazepam) .... Take One Tablet By Mouth Once Daily. 7)  Nitrostat 0.4 Mg Subl (Nitroglycerin) .Marland Kitchen.. 1 Tab As Needed For Chest Pain 8)  Metformin  Hcl 500 Mg Tabs (Metformin Hcl) .... Take 1 Tablet By Mouth Once A Day 9)  Advair Diskus 250-50 Mcg/dose Aepb (Fluticasone-Salmeterol) .... As Needed 10)  Proair Hfa 108 (90 Base) Mcg/act Aers (Albuterol Sulfate) .... 2 Puffs  Four Times A Day As Needed Rescue Inhaler 11)  Doxycycline Monohydrate 100 Mg  Caps (Doxycycline Monohydrate) .... By Mouth Twice Daily 12)  Oxygen 2 L/ Min, Continuous and Portable.  Allergies (verified): 1)  ! Codeine  Past History:  Past Medical History: Last updated: 09/23/2010 CAD (ICD-414.00)      a. 07/2004 - NSTEMI - DES TO RCA      b. 11/2004 - DES TO LAD HYPERTENSION (ICD-401.9) HYPERLIPIDEMIA (ICD-272.4) COPD (ICD-496)- PFT 09/06/10- FEV1 0.59/ 24%, FVC/FEV1 0.28, DLCO 42%, some response to dilator ONGOING TOBACCO ABUSE (ICD-305.1) FATIGUE  Review of Systems       As per HPI and past medical history; otherwise all systems negative.   Vital Signs:  Patient profile:   70 year old female Height:      61 inches Weight:      113 pounds Pulse rate:   84 / minute Pulse rhythm:   regular BP sitting:   130 / 80  (right arm)  Vitals Entered By: Jacquelin Hawking, CMA (October 07, 2010 1:53 PM)  Physical Exam  General:  Thin.  in no acute distress. Neck: No JVD, HJR, Bruit, or  thyroid enlargement Lungs: Decreased BS throughout, No tachypnea, clear without wheezing, rales, or rhonchi Cardiovascular: Distant.  RRR. no murmurs, gallops, bruit, thrill, or heave. Abdomen: BS normal. Soft without organomegaly, masses, lesions or tenderness. Extremities: right carotid hematoma or hemorrhage, lower extremities without cyanosis, clubbing or edema. Good distal pulses bilateral SKin: Warm, no lesions or rashes  Musculoskeletal: No deformities Neuro: no focal signs    Impression & Recommendations:  Problem # 1:  CAD (ICD-414.00) Stable by recent cath. She is 5+ years out from her last stent. OK to stop Plavix.  Problem # 2:  COPD (ICD-496) Very  severe/end-stage. Following closely with Dr. Maple Hudson. Congratulated her on cutting back on cigarettes and encouraged her to quit completely.   Problem # 3:  CAROTID ARTERY DISEASE (ICD-433.10) Asymptomatic. Continue statin. Due for f/u ultrasound.   Other Orders: EKG w/ Interpretation (93000) Carotid Duplex (Carotid Duplex)  Patient Instructions: 1)  Your physician recommends that you schedule a follow-up appointment in: 9 months with Dr. Gala Romney 2)  Your physician has recommended you make the following change in your medication: Stop Plavix after you finish this bottle. 3)  Your physician has requested that you have a carotid duplex. This test is an ultrasound of the carotid arteries in your neck. It looks at blood flow through these arteries that supply the brain with blood. Allow one hour for this exam. There are no restrictions or special instructions.

## 2010-11-04 NOTE — Assessment & Plan Note (Signed)
Summary: per PW / cj   Primary Provider/Referring Provider:  Dr Andi Devon  CC:  Follow up visit-cold in December;had abx but never took Prednisone.Marland Kitchen  History of Present Illness:  September 23, 2010- COPD, tobacco Nurse-CC: 1 month f/u appt to discuss PFT and 6 min walk test results.  Decreased smoking to 1 cig a day.  breathing has improved. coughing up clear to yellow sputum.  She is going by the [place] to stop smoking. We discussed her cough- using Adviar as needed.  She is in a study trial using Spiriva.  PFT- Severe emphysema. FEV1 0.59/ 24%. R 0.28. FVC improved some after BD. - 09/06/10- Room Air: 89%, 86%, 92% 288 meters  October 14, 2010- COPD, tobacco Nurse-CC: Follow up visit-cold in December;had abx but never took Prednisone. Reviewed cardilogy note. Cath showed unobstructed stents. Our office called in prednisone and doxycycline in December for a bronchits with fever,  but she didn't take the prednisone because it causes mood changes. Started home oxygen for sleep and rarely in day. It has made a great difference - sleeping much better. She no longer aches on waking in AM as she used to. She makes one cigarette last her all day.     Preventive Screening-Counseling & Management  Alcohol-Tobacco     Smoking Status: current     Smoking Cessation Counseling: yes     Smoke Cessation Stage: precontemplative     Packs/Day: approx 1 cig a day     Year Started: 1953     Tobacco Counseling: to quit use of tobacco products  Current Medications (verified): 1)  Vytorin 10-40 Mg Tabs (Ezetimibe-Simvastatin) .... Take One Tablet By Mouth Dailyat Bedtime 2)  Metoprolol Succinate 25 Mg Xr24h-Tab (Metoprolol Succinate) .... Take 1 Tablet By Mouth Once A Day 3)  Aspirin 81 Mg Tbec (Aspirin) .... Take One Tablet By Mouth Daily 4)  Spiriva Handihaler 18 Mcg Caps (Tiotropium Bromide Monohydrate) .... Uad 5)  Klonopin 0.5 Mg Tabs (Clonazepam) .... Take One Tablet By Mouth Once  Daily. 6)  Nitrostat 0.4 Mg Subl (Nitroglycerin) .Marland Kitchen.. 1 Tab As Needed For Chest Pain 7)  Metformin Hcl 500 Mg Tabs (Metformin Hcl) .... Take 1 Tablet By Mouth Once A Day 8)  Advair Diskus 250-50 Mcg/dose Aepb (Fluticasone-Salmeterol) .Marland Kitchen.. 1 Puff Two Times A Day and Rinse Mouth Well 9)  Proair Hfa 108 (90 Base) Mcg/act Aers (Albuterol Sulfate) .... 2 Puffs  Four Times A Day As Needed Rescue Inhaler 10)  Oxygen 2 L/ Min, Continuous and Portable.  Allergies (verified): 1)  ! Codeine  Past History:  Past Medical History: Last updated: 09/23/2010 CAD (ICD-414.00)      a. 07/2004 - NSTEMI - DES TO RCA      b. 11/2004 - DES TO LAD HYPERTENSION (ICD-401.9) HYPERLIPIDEMIA (ICD-272.4) COPD (ICD-496)- PFT 09/06/10- FEV1 0.59/ 24%, FVC/FEV1 0.28, DLCO 42%, some response to dilator ONGOING TOBACCO ABUSE (ICD-305.1) FATIGUE  Past Surgical History: Last updated: 08/23/2010 Drug-eluting stent placement in the mid right coronary artery. Hysterectomy Coronary angiography/CYPHER stenting, left anterior descending, November 25, 2004.  Family History: Last updated: 08/23/2010  Mother had diabetes, and may have died of a heart attack age 71. Father - died 9 yo old age, Alzheimers  Social History: Last updated: 09/23/2010  She is married.  She started smoking at age 73.  She cont. to smoke about 10 cigarettes/day.  currently smoking 1 cig a day.  She occasionally drinks wine.  She denies any recreational drugs. Husband  smokes cigars Married, 3 children Retired Public librarian  Risk Factors: Smoking Status: current (10/14/2010) Packs/Day: approx 1 cig a day (10/14/2010)  Social History: Packs/Day:  approx 1 cig a day  Review of Systems      See HPI       The patient complains of shortness of breath with activity and non-productive cough.  The patient denies shortness of breath at rest, productive cough, coughing up blood, chest pain, irregular heartbeats, acid heartburn, indigestion,  loss of appetite, weight change, abdominal pain, difficulty swallowing, sore throat, tooth/dental problems, headaches, nasal congestion/difficulty breathing through nose, and sneezing.    Vital Signs:  Patient profile:   70 year old female Height:      61 inches Weight:      117.13 pounds BMI:     22.21 O2 Sat:      91 % on Room air Pulse rate:   68 / minute BP sitting:   110 / 58  (right arm) Cuff size:   regular  Vitals Entered By: Reynaldo Minium CMA (October 14, 2010 2:42 PM)  O2 Flow:  Room air CC: Follow up visit-cold in December;had abx but never took Prednisone.   Physical Exam  Additional Exam:  General: A/Ox3; pleasant and cooperative, NAD, trim, talkative SKIN: no rash, lesions NODES: no lymphadenopathy HEENT: [place]/AT, EOM- WNL, Conjuctivae- clear, PERRLA, TM-WNL, Nose- clear, Throat- clear and wnl, dentures, Mallampati  II NECK: Supple w/ fair ROM, JVD- none, normal carotid impulses w/o bruits Thyroid- normal to palpation CHEST: diminished, slow expiratory phase. Congested cough noted only once HEART: RRR, no m/g/r heard ABDOMEN:trim- not obese ZOX:WRUE, nl pulses, no edema  NEURO: Grossly intact to observation      Impression & Recommendations:  Problem # 1:  COPD (ICD-496) She is feeling well by her standards, with cough revealing a persistent bronchitis pattern. She recognizes oxygen dependence at night.  Problem # 2:  TOBACCO ABUSE (ICD-305.1)  It to hard to get too shrill about one cigarette daily, but I still try to make the point with her and her family is encouraging her to stop.   Medications Added to Medication List This Visit: 1)  Advair Diskus 250-50 Mcg/dose Aepb (Fluticasone-salmeterol) .Marland Kitchen.. 1 puff two times a day and rinse mouth well 2)  Oxygen 2 L/ Min, Continuous and Portable.  .... Advanced  Other Orders: Est. Patient Level III (45409)  Patient Instructions: 1)  Keep scheduled February appointment for oxygen documentation. 2)  Try plain  Mucinex otc for a week or so at a time to thin mucus.

## 2010-11-04 NOTE — Progress Notes (Signed)
Summary: Sick call - also request results of ONO  Phone Note Call from Patient   Reason for Call: Acute Illness Summary of Call: pt ill and coughing yellow I called in pred and doxy to her pharmacy she will need OV soon  Initial call taken by: Storm Frisk MD,  October 02, 2010 3:56 PM  Follow-up for Phone Call        called, spoke with pt.  States she is feeling "much better" but did not take prednsione.  Advised PW recs she come in for f/u OV -- pt ok with this.  Ov scheduled with CY for 10/14/10 at 2:45pm.    Pt requesting results of ONO.  States [facility] picked it up yesterday. Results placed on CY's cart along with copy of phone note for him to addres.     Follow-up by: Gweneth Dimitri RN,  October 05, 2010 9:01 AM  Additional Follow-up for Phone Call Additional follow up Details #1::        Her oxygen levels stayed low all night.  I recommend we start her on home O2 and will send the order. Please let her know.  I will review with her at her January appointment.  Additional Follow-up by: Waymon Budge MD,  October 05, 2010 1:20 PM    Additional Follow-up for Phone Call Additional follow up Details #2::    Called and spoke with pt and she is aware of her ono results. I advised her per Dr. Maple Hudson that her o2 levels stayed low all night and that he suggested that she start on home o2 at 2 lpm continuous. Also advised pt that she needed to keep her 10/14/10 appt as per his note. Pt understands that [facility] will contact her today to bring o2 out to her.  We have requested that [facility] evaluate her for a small portable o2 device such as the helios (pt requested liquid o2). Pt is aware of o2 referral and that she needs to keep her January appt with Dr. Maple Hudson. Order faxed to [facility]. Rhonda Cobb  October 05, 2010 1:44 PM   New/Updated Medications: * OXYGEN 2 L/ MIN, CONTINUOUS AND PORTABLE.

## 2010-11-04 NOTE — Progress Notes (Signed)
  Phone Note Other Incoming   Request: Send information Summary of Call: Request for records received from [facility]. Request forwarded to [facility].

## 2010-11-04 NOTE — Progress Notes (Signed)
Summary: oder for O2  Phone Note Call from Patient Call back at Home Phone [phone]   Caller: Patient Summary of Call: Stated they received infomation from [facility] and stated that her O2 was low during the night and that we were going to order O2 for the patient. Patient wants to know if this has been ordered and if so who was it ordered from. Patient can be reached at [phone] Initial call taken by: Vedia Coffer,  October 06, 2010 11:18 AM  Follow-up for Phone Call        according to order, order placed on 10-02-10 and faxed to [facility] on 10-05-10.  I will forward to PCC's so they may check on order. Pt says she called [facility] and they said they had not received anything regarding oxygen. Michel Bickers [facility]  October 06, 2010 2:37 PM  Additional Follow-up for Phone Call Additional follow up Details #1::        spoke to [facility] pt will be called in just a few mins and pt is aware of this  Additional Follow-up by: Oneita Jolly,  October 06, 2010 3:07 PM

## 2010-11-04 NOTE — Miscellaneous (Signed)
Summary: abx/pred rx  Medications Added DOXYCYCLINE MONOHYDRATE 100 MG  CAPS (DOXYCYCLINE MONOHYDRATE) By mouth twice daily PREDNISONE 10 MG  TABS (PREDNISONE) Take as directed Take 4 daily for two days, then 3 daily for two days, then two daily for two days then one daily for two days then stop       Clinical Lists Changes  Medications: Added new medication of DOXYCYCLINE MONOHYDRATE 100 MG  CAPS (DOXYCYCLINE MONOHYDRATE) By mouth twice daily - Signed Added new medication of PREDNISONE 10 MG  TABS (PREDNISONE) Take as directed Take 4 daily for two days, then 3 daily for two days, then two daily for two days then one daily for two days then stop - Signed Rx of DOXYCYCLINE MONOHYDRATE 100 MG  CAPS (DOXYCYCLINE MONOHYDRATE) By mouth twice daily;  #14 x 0;  Signed;  Entered by: Storm Frisk MD;  Authorized by: Storm Frisk MD;  Method used: Electronically to [facility]  [address] [id number] [address], [place], [place]  [address], Ph: [phone] or [phone], Fax: [phone] Rx of PREDNISONE 10 MG  TABS (PREDNISONE) Take as directed Take 4 daily for two days, then 3 daily for two days, then two daily for two days then one daily for two days then stop;  #20 x 0;  Signed;  Entered by: Storm Frisk MD;  Authorized by: Storm Frisk MD;  Method used: Electronically to [facility]  [address] [id number] [address] [place], [place]  [address], Ph: [phone] or [phone], Fax: [phone]    Prescriptions: PREDNISONE 10 MG  TABS (PREDNISONE) Take as directed Take 4 daily for two days, then 3 daily for two days, then two daily for two days then one daily for two days then stop  #20 x 0   Entered and Authorized by:   Storm Frisk MD   Signed by:   Storm Frisk MD on 10/02/2010   Method used:   [facility]ically to        [facility]  [address] [id number] (retail)       [address]       [place], [place]  [address]       Ph: [phone] or  [phone]       Fax: [phone]   RxID:   [id number] DOXYCYCLINE MONOHYDRATE 100 MG  CAPS (DOXYCYCLINE MONOHYDRATE) By mouth twice daily  #14 x 0   Entered and Authorized by:   Storm Frisk MD   Signed by:   Storm Frisk MD on 10/02/2010   Method used:   [facility]ically [facility]        [facility]  [address] [id number] ([facility]       [address]       [place], [place]  [address]       Ph: [phone] or [phone]       Fax: [phone]   RxID:   [id number]

## 2010-11-04 NOTE — Assessment & Plan Note (Signed)
Summary: SIX MIN WALK-PULM STRESS TEST   Nurse Visit   Vital Signs:  Patient profile:   70 year old female Pulse rate:   71 / minute BP sitting:   130 / 74  Medications Prior to Update: 1)  Vytorin 10-40 Mg Tabs (Ezetimibe-Simvastatin) .... Take One Tablet By Mouth Dailyat Bedtime 2)  Metoprolol Succinate 25 Mg Xr24h-Tab (Metoprolol Succinate) .... Take One-Half Tablet By Mouth Daily As Needed 3)  Plavix 75 Mg Tabs (Clopidogrel Bisulfate) .... Take One Tablet By Mouth Daily 4)  Aspirin 81 Mg Tbec (Aspirin) .... Take One Tablet By Mouth Daily 5)  Spiriva Handihaler 18 Mcg Caps (Tiotropium Bromide Monohydrate) .... Uad 6)  Klonopin 0.5 Mg Tabs (Clonazepam) .... Take One Tablet By Mouth Once Daily. 7)  Nitrostat 0.4 Mg Subl (Nitroglycerin) .Marland Kitchen.. 1 Tab As Needed For Chest Pain 8)  Metformin Hcl 500 Mg Tabs (Metformin Hcl) .... Take 1 Tablet By Mouth Once A Day 9)  Advair Diskus 250-50 Mcg/dose Aepb (Fluticasone-Salmeterol) .... As Needed 10)  Proair Hfa 108 (90 Base) Mcg/act Aers (Albuterol Sulfate) .... 2 Puffs  Four Times A Day As Needed Rescue Inhaler  Allergies: 1)  ! Codeine  Orders Added: 1)  Pulmonary Stress (6 min walk) [94620]   Six Minute Walk Test Medications taken before test(dose and time):  2)  Metoprolol Succinate 25 Mg Xr24h-Tab (Metoprolol Succinate) .... Take One-Half Tablet By Mouth Daily As Needed 3)  Plavix 75 Mg Tabs (Clopidogrel Bisulfate) .... Take One Tablet By Mouth Daily 4)  Aspirin 81 Mg Tbec (Aspirin) .... Take One Tablet By Mouth Dail 6)  Klonopin 0.5 Mg Tabs (Clonazepam) .... Take One Tablet By Mouth Once Daily. 8)  Metformin Hcl 500 Mg Tabs (Metformin Hcl) .... Take 1 Tablet By Mouth Once A Day Supplemental oxygen during the test: No  Lap counter(place a tick mark inside a square for each lap completed) lap 1 complete  lap 2 complete   lap 3 complete   lap 4 complete  lap 5 complete  lap 6 complete   Baseline  BP sitting: 130/ 74 Heart  rate: 71 Dyspnea ( Borg scale) 0 Fatigue (Borg scale) 0 SPO2 89  End Of Test  BP sitting: 140/ 80 Heart rate: 90 Dyspnea ( Borg scale) 4 Fatigue (Borg scale) 4 SPO2 86  2 Minutes post  BP sitting: 138/ 78 Heart rate: 86 SPO2 92  Stopped or paused before six minutes? Yes Reason: paused for 2 mins- SOB/ weak legs  Interpretation: Number of laps  6 X 48 meters =   288 meters =    288 meters   Total distance walked in six minutes: 288 meters  Tech ID: Tivis Ringer, CNA (September 06, 2010 3:16 PM) Tech Comments Pt paused for 2 mins (SOB-weak legs). completed test w/ same complaints.   Appended Document: SIX MIN WALK-PULM STRESS TEST 6 MWT- significant oxygen drop with exercise. We usually recommend oxygen in this range. We can discuss results at upcoming OV.  Appended Document: SIX MIN WALK-PULM STRESS TEST Pt aware of results and up coming appt time and date.

## 2010-11-04 NOTE — Assessment & Plan Note (Signed)
Summary: ROV 1 MONTH ///KP   Primary Dima Mini/Referring Galan Ghee:  Dr Andi Devon  CC:  1 month f/u appt to discuss PFT and 6 min walk test results.  decreased smoking to 1 cig a day.  breathing has improved. coughing up clear to yellow sputum. [name]  History of Present Illness: History of Present Illness: August 23, 2010- Nurse CC: Pulmonary Consult pt c/o sob with exertion, pain in Left lower lobe, symptoms x 10 months , cardiac cath 07/09/10. I had seen her at the old office around 2004 for dx of COPD and qualified her for disability then.  70yoF seen on kind referral by Dr Gala Romney for COPD. Smoker. Says she has had notable shortness of breath and cough since 1998, bu no oxygen and never hospitalised for her lungs. Chest tightness and cough vary with weather. Occasional sputum. sometimes cough wakes her. Never pneumonia. Hadd pneumovax x 2, flu vax. Dyspnea maybe at one block level, less than 1 flight of stairs. Spring and Fall seasons associated with nasal congestion. Denies blood, palpitation. Gets nonexertional pains in left lower lateral chest wall. Still smoking 1/2-1 PPD. Failed nicotine patches and Welbutrin, afraid to try Chantix. Cried when she quit smoking for 4 months once in the past- made emotions labile.  Hx coronary disease with 2 stents.   September 23, 2010- COPD, tobacco Nurse-CC: 1 month f/u appt to discuss PFT and 6 min walk test results.  Decreased smoking to 1 cig a day.  breathing has improved. coughing up clear to yellow sputum.  She is going by the [facility] to stop smoking. We discussed her cough- using Adviar as needed.  She is in a study trial using Spiriva.  PFT- Severe emphysema. FEV1 0.59/ 24%. R 0.28. FVC improvedf some after BD. - 09/06/10- Room Air: 89%, 86%, 92% 288 meters     Preventive Screening-Counseling & Management  Alcohol-Tobacco     Smoking Status: current     Smoking Cessation Counseling: yes     Smoke Cessation Stage:  precontemplative     Packs/Day: 1.0     Year Started: 1953     Tobacco Counseling: to quit use of tobacco products  Current Medications (verified): 1)  Vytorin 10-40 Mg Tabs (Ezetimibe-Simvastatin) .... Take One Tablet By Mouth Dailyat Bedtime 2)  Metoprolol Succinate 25 Mg Xr24h-Tab (Metoprolol Succinate) .... Take 1 Tablet By Mouth Once A Day 3)  Plavix 75 Mg Tabs (Clopidogrel Bisulfate) .... Take One Tablet By Mouth Daily 4)  Aspirin 81 Mg Tbec (Aspirin) .... Take One Tablet By Mouth Daily 5)  Spiriva Handihaler 18 Mcg Caps (Tiotropium Bromide Monohydrate) .... Uad 6)  Klonopin 0.5 Mg Tabs (Clonazepam) .... Take One Tablet By Mouth Once Daily. 7)  Nitrostat 0.4 Mg Subl (Nitroglycerin) .[name].. 1 Tab As Needed For Chest Pain 8)  Metformin Hcl 500 Mg Tabs (Metformin Hcl) .... Take 1 Tablet By Mouth Once A Day 9)  Advair Diskus 250-50 Mcg/dose Aepb (Fluticasone-Salmeterol) .... As Needed 10)  Proair Hfa 108 (90 Base) Mcg/act Aers (Albuterol Sulfate) .... 2 Puffs  Four Times A Day As Needed Rescue Inhaler  Allergies (verified): 1)  ! Codeine  Past History:  Past Medical History: CAD (ICD-414.00)      a. 07/2004 - NSTEMI - DES TO RCA      b. 11/2004 - DES TO LAD HYPERTENSION (ICD-401.9) HYPERLIPIDEMIA (ICD-272.4) COPD (ICD-496)- PFT 09/06/10- FEV1 0.59/ 24%, FVC/FEV1 0.28, DLCO 42%, some response to dilator ONGOING TOBACCO ABUSE (ICD-305.1) FATIGUE  Social History:  She is married.  She started smoking at age 30.  She cont. to smoke about 10 cigarettes/day.  currently smoking 1 cig a day.  She occasionally drinks wine.  She denies any recreational drugs. 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  The patient denies shortness of breath at rest, productive cough, coughing up blood, chest pain, irregular heartbeats, acid heartburn,  indigestion, loss of appetite, weight change, abdominal pain, difficulty swallowing, sore throat, tooth/dental problems, headaches, nasal congestion/difficulty breathing through nose, sneezing, itching, ear ache, rash, change in color of mucus, and fever.    Vital Signs:  Patient profile:   70 year old female Height:      61 inches Weight:      117.38 pounds BMI:     22.26 O2 Sat:      90 % on Room air Pulse rate:   67 / minute BP sitting:   122 / 70  (right arm) Cuff size:   regular  Vitals Entered By: Arman Filter LPN (September 23, 2010 3:23 PM)  O2 Flow:  Room air CC: 1 month f/u appt to discuss PFT and 6 min walk test results.  decreased smoking to 1 cig a day.  breathing has improved. coughing up clear to yellow sputum.  Comments Medications reviewed with patient Arman Filter LPN  September 23, 2010 3:33 PM    Physical Exam  Additional Exam:  General: A/Ox3; pleasant and cooperative, NAD, trim SKIN: no rash, lesions NODES: no lymphadenopathy HEENT: [place]/AT, EOM- WNL, Conjuctivae- clear, PERRLA, TM-WNL, Nose- clear, Throat- clear and wnl, dentures, Mallampati  II NECK: Supple w/ fair ROM, JVD- none, normal carotid impulses w/o bruits Thyroid- normal to palpation CHEST: diminished, slow.congested cough HEART: RRR, no m/g/r heard ABDOMEN:trim- not obese ZOX:WRUE, nl pulses, no edema  NEURO: Grossly intact to observation      Impression & Recommendations:  Problem # 1:  COPD (ICD-496) I would like her to try using Advair daily and will give her a sample while she does so. This is an emphysema pattern and she may not feel much response to bronchodilator.  We will assess overnight oximetry I would like to know more than she does about the Spiriva study she is in.   Problem # 2:  TOBACCO ABUSE (ICD-305.1) She has absolutely no more room for smoking and i tried to express this to her. She has cut down to just a few cigarettes on most days, and addiction is not likely as  important now as habit. We again discussed smoking cessation resources.   Medications Added to Medication List This Visit: 1)  Metoprolol Succinate 25 Mg Xr24h-tab (Metoprolol succinate) .... Take 1 tablet by mouth once a day  Other Orders: Est. Patient Level IV (45409) DME Referral (DME)  Patient Instructions: 1)  Please schedule a follow-up appointment in 2 months. 2)  See [facility] to arrange overnight oximetry to assess your oxygen level during sleep 3)  I think it would help you to use the Advair 250/50  4)     1 puff and rinse mouth, twice daily , every day     sample Advair   Immunization History:  Influenza Immunization History:    Influenza:  historical (07/19/2010)  Pneumovax Immunization History:    Pneumovax:  historical (10/04/2007)

## 2010-11-10 NOTE — Letter (Signed)
Summary: CMN for Oximetry Test/[facility]  CMN for Oximetry Test/[facility]   Imported By: Sherian Rein 11/01/2010 11:09:41  _____________________________________________________________________  External Attachment:    Type:   Image     Comment:   External Document

## 2010-11-10 NOTE — Letter (Signed)
Summary: Oximetry/[facility]  Oximetry/[facility]   Imported By: Lester [place] 11/04/2010 09:38:21  _____________________________________________________________________  External Attachment:    Type:   Image     Comment:   External Document

## 2010-11-18 ENCOUNTER — Encounter: Payer: Self-pay | Admitting: Internal Medicine

## 2010-11-18 ENCOUNTER — Ambulatory Visit (INDEPENDENT_AMBULATORY_CARE_PROVIDER_SITE_OTHER): Payer: MEDICARE | Admitting: Internal Medicine

## 2010-11-18 DIAGNOSIS — J449 Chronic obstructive pulmonary disease, unspecified: Secondary | ICD-10-CM

## 2010-11-19 ENCOUNTER — Telehealth (INDEPENDENT_AMBULATORY_CARE_PROVIDER_SITE_OTHER): Payer: Self-pay | Admitting: *Deleted

## 2010-11-30 NOTE — Assessment & Plan Note (Signed)
Summary: 2 month//sh   Primary Provider/Referring Provider:  Dr Andi Devon  CC:  2 month follow up visit-COPD.Marland Kitchen  History of Present Illness:  September 23, 2010- COPD, tobacco Nurse-CC: 1 month f/u appt to discuss PFT and 6 min walk test results.  Decreased smoking to 1 cig a day.  breathing has improved. coughing up clear to yellow sputum.  She is going by the [facility] to stop smoking. We discussed her cough- using Adviar as needed.  She is in a study trial using Spiriva.  PFT- Severe emphysema. FEV1 0.59/ 24%. R 0.28. FVC improved some after BD. - 09/06/10- Room Air: 89%, 86%, 92% 288 meters  October 14, 2010- COPD, tobacco Nurse-CC: Follow up visit-cold in December;had abx but never took Prednisone. Reviewed cardilogy note. Cath showed unobstructed stents. Our office called in prednisone and doxycycline in December for a bronchits with fever,  but she didn't take the prednisone because it causes mood changes. Started home oxygen for sleep and rarely in day. It has made a great difference - sleeping much better. She no longer aches on waking in AM as she used to. She makes one cigarette last her all day.  November 18, 2010- COPD, tobacco Nurse-CC: 2 month follow up visit-COPD. She feels great and says oxygen has continued to prevent her aches and pains when she sleeps with it. Uses only occasionally in the day time. She still has some loose cough and scant green at times. Not coughing at night. Some night sweat. Gives hx of CXR at [facility]- we will seek the report.    Preventive Screening-Counseling & Management  Alcohol-Tobacco     Smoking Status: current     Smoking Cessation Counseling: yes     Smoke Cessation Stage: precontemplative     Packs/Day: approx 1 cig a day     Year Started: 1953     Tobacco Counseling: to quit use of tobacco products  Current Medications (verified): 1)  Vytorin 10-40 Mg Tabs (Ezetimibe-Simvastatin) .... Take One Tablet By Mouth Dailyat  Bedtime 2)  Metoprolol Succinate 25 Mg Xr24h-Tab (Metoprolol Succinate) .... Take 1 Tablet By Mouth Once A Day 3)  Aspirin 81 Mg Tbec (Aspirin) .... Take One Tablet By Mouth Daily 4)  Spiriva Handihaler 18 Mcg Caps (Tiotropium Bromide Monohydrate) .... Uad 5)  Klonopin 0.5 Mg Tabs (Clonazepam) .... Take One Tablet By Mouth Once Daily. 6)  Nitrostat 0.4 Mg Subl (Nitroglycerin) .Marland Kitchen.. 1 Tab As Needed For Chest Pain 7)  Metformin Hcl 500 Mg Tabs (Metformin Hcl) .... Take 1 Tablet By Mouth Once A Day 8)  Advair Diskus 250-50 Mcg/dose Aepb (Fluticasone-Salmeterol) .Marland Kitchen.. 1 Puff Two Times A Day and Rinse Mouth Well 9)  Proair Hfa 108 (90 Base) Mcg/act Aers (Albuterol Sulfate) .... 2 Puffs  Four Times A Day As Needed Rescue Inhaler 10)  Oxygen 2 L/ Min, Continuous and Portable. .... Advanced  Allergies (verified): 1)  ! Codeine  Past History:  Past Medical History: Last updated: 09/23/2010 CAD (ICD-414.00)      a. 07/2004 - NSTEMI - DES TO RCA      b. 11/2004 - DES TO LAD HYPERTENSION (ICD-401.9) HYPERLIPIDEMIA (ICD-272.4) COPD (ICD-496)- PFT 09/06/10- FEV1 0.59/ 24%, FVC/FEV1 0.28, DLCO 42%, some response to dilator ONGOING TOBACCO ABUSE (ICD-305.1) FATIGUE  Past Surgical History: Last updated: 08/23/2010 Drug-eluting stent placement in the mid right coronary artery. Hysterectomy Coronary angiography/CYPHER stenting, left anterior descending, November 25, 2004.  Family History: Last updated: 08/23/2010  Mother had diabetes, and  may have died of a heart attack age 35. Father - died 49 yo old age, Alzheimers  Social History: Last updated: 09/23/2010  She is married.  She started smoking at age 42.  She cont. to smoke about 10 cigarettes/day.  currently smoking 1 cig a day.  She occasionally drinks wine.  She denies any recreational drugs. 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  The patient denies shortness of breath at rest, non-productive cough, coughing up blood, chest pain, irregular heartbeats, acid heartburn, indigestion, loss of appetite, weight change, abdominal pain, difficulty swallowing, sore throat, tooth/dental problems, headaches, nasal congestion/difficulty breathing through nose, sneezing, itching, ear ache, anxiety, hand/feet swelling, rash, change in color of mucus, and fever.    Vital Signs:  Patient profile:   70 year old female Height:      61 inches Weight:      122.13 pounds BMI:     23.16 O2 Sat:      92 % on Room air Pulse rate:   68 / minute BP sitting:   118 / 70  (left arm) Cuff size:   regular  Vitals Entered By: Reynaldo Minium CMA (November 18, 2010 3:49 PM)  O2 Flow:  Room air CC: 2 month follow up visit-COPD.   Physical Exam  Additional Exam:  General: A/Ox3; pleasant and cooperative, NAD, trim, talkative SKIN: no rash, lesions NODES: no lymphadenopathy HEENT: [place]/AT, EOM- WNL, Conjuctivae- clear, PERRLA, TM-WNL, Nose- clear, Throat- clear and wnl, dentures, Mallampati  II NECK: Supple w/ fair ROM, JVD- none, normal carotid impulses w/o bruits Thyroid- normal to palpation CHEST: diminished, slow expiratory phase. Congested cough noted  HEART: RRR, no m/g/r heard ABDOMEN:trim- not obese ZOX:WRUE, nl pulses, no edema  NEURO: Grossly intact to observation      Impression & Recommendations:  Problem # 1:  COPD (ICD-496) COPD with hypoxic repsiratory failure, needing oxygen at night. Chronic bronchitis with probable low grade bronchilitis. We will give trial of augmentin.  Problem # 2:  CAD (ICD-414.00) Hx of significant Coronary disease. We recognize this may contribute to exertional dyspnea.  Her updated medication list for this problem includes:    Metoprolol Succinate 25 Mg  Xr24h-tab (Metoprolol succinate) .Marland Kitchen... Take 1 tablet by mouth once a day    Aspirin 81 Mg Tbec (Aspirin) .Marland Kitchen... Take one tablet by mouth daily    Nitrostat 0.4 Mg Subl (Nitroglycerin) .Marland Kitchen... 1 tab as needed for chest pain  Medications Added to Medication List This Visit: 1)  Augmentin 500-125 Mg Tabs (Amoxicillin-pot clavulanate) .... Take 1 by mouth two times a day  Other Orders: Est. Patient Level III (45409)  Patient Instructions: 1)  Please schedule a follow-up appointment in 4 months. 2)  Script for antibiotic 3)  We will get report from the CXR for our file.  Prescriptions: AUGMENTIN 500-125 MG TABS (AMOXICILLIN-POT CLAVULANATE) take 1 by mouth two times a day  #14 x 0   Entered by:   Reynaldo Minium CMA   Authorized by:   Waymon Budge MD   Signed by:   Reynaldo Minium CMA on 11/18/2010   Method used:   Electronically to        [facility]  [address] [phone]* (retail)  [address]       [place], [place]  [address]       Ph: [phone] or [phone]       Fax: [phone]   RxID:   [id number]

## 2010-11-30 NOTE — Progress Notes (Signed)
Summary: night sweats - LMTCB x 1  Phone Note Call from Patient Call back at Home Phone [phone]   Caller: Patient Call For: YOUNG Summary of Call: patient phoned stated that she saw Dr Maple Hudson yesterday and he asked if she was having any night sweats, she told him that she was but than they moved onto another subject and they didnt discuss it any longer and she was wondering what was causing them. She states that she is on oxygen at nights and wants to know if that can be the cause. Patient can be reached at [phone] Initial call taken by: Vedia Coffer,  November 19, 2010 11:44 AM  Follow-up for Phone Call        called and spoke with pt and she stated that at her appt yesterday they didnt get to discuss everything about the night sweats----she is concerned that this is coming from her using the oxygen at night.  please advise.  thanks Randell Loop CMA  November 19, 2010 12:25 PM   Additional Follow-up for Phone Call Additional follow up Details #1::        Per CDY-O2 will not cause night sweats; He dont know what is causing the sweats but dont think its an infection.Reynaldo Minium CMA  November 19, 2010 4:17 PM   [facility]  Gweneth Dimitri RN  November 19, 2010 4:29 PM     Additional Follow-up for Phone Call Additional follow up Details #2::    pt aware of dr Roxy Cedar response and is fine with this, she will continue the antibiotic and call back if symptoms do not improve or if they get worse Follow-up by: Philipp Deputy CMA,  November 22, 2010 9:57 AM

## 2010-12-02 ENCOUNTER — Telehealth: Payer: Self-pay | Admitting: Internal Medicine

## 2010-12-09 NOTE — Progress Notes (Signed)
Summary: refill request   Phone Note Refill Request Message from:  Patient on December 02, 2010 3:05 PM  Refills Requested: Medication #1:  NITROSTAT 0.4 MG SUBL 1 tab as needed for chest pain [facility] [address]-[facility] out wants refill today if possible   Method Requested: Telephone to Pharmacy Initial call taken by: Glynda Jaeger,  December 02, 2010 3:06 PM    Prescriptions: NITROSTAT 0.4 MG SUBL (NITROGLYCERIN) 1 tab as needed for chest pain  #25 x 12   Entered by:   Hardin Negus, RMA   Authorized by:   Dolores Patty, MD, [facility]   Signed by:   Hardin Negus, RMA on 12/02/2010   Method used:   [facility]  [address] [id number]       [address]       [place], [place]  [address]       Ph: [phone] or [phone]       Fax: [phone]   RxID:   [id number]

## 2010-12-15 LAB — POCT I-STAT GLUCOSE
Glucose, Bld: 143 mg/dL — ABNORMAL HIGH (ref 70–99)
Operator id: 122531

## 2010-12-26 ENCOUNTER — Emergency Department (HOSPITAL_COMMUNITY)
Admission: EM | Admit: 2010-12-26 | Discharge: 2010-12-26 | Disposition: A | Discharge status: Home / Self Care | Payer: Medicare Other | Attending: Emergency Medicine | Admitting: Emergency Medicine

## 2010-12-26 DIAGNOSIS — N390 Urinary tract infection, site not specified: Secondary | ICD-10-CM | POA: Insufficient documentation

## 2010-12-26 DIAGNOSIS — I2510 Atherosclerotic heart disease of native coronary artery without angina pectoris: Secondary | ICD-10-CM | POA: Insufficient documentation

## 2010-12-26 DIAGNOSIS — E119 Type 2 diabetes mellitus without complications: Secondary | ICD-10-CM | POA: Insufficient documentation

## 2010-12-26 DIAGNOSIS — R10819 Abdominal tenderness, unspecified site: Secondary | ICD-10-CM | POA: Insufficient documentation

## 2010-12-26 LAB — URINALYSIS, ROUTINE W REFLEX MICROSCOPIC
Protein, ur: 100 mg/dL — AB
Specific Gravity, Urine: 1.013 (ref 1.005–1.030)
Urobilinogen, UA: 0.2 mg/dL (ref 0.0–1.0)

## 2010-12-26 LAB — URINE MICROSCOPIC-ADD ON

## 2010-12-26 LAB — POCT I-STAT, CHEM 8
Calcium, Ion: 1.18 mmol/L (ref 1.12–1.32)
Chloride: 103 mEq/L (ref 96–112)
HCT: 40 % (ref 36.0–46.0)
Hemoglobin: 13.6 g/dL (ref 12.0–15.0)
Potassium: 4.1 mEq/L (ref 3.5–5.1)
Sodium: 141 mEq/L (ref 135–145)

## 2011-01-11 ENCOUNTER — Other Ambulatory Visit (HOSPITAL_COMMUNITY): Payer: Self-pay | Admitting: Internal Medicine

## 2011-01-11 DIAGNOSIS — Z1231 Encounter for screening mammogram for malignant neoplasm of breast: Secondary | ICD-10-CM

## 2011-01-25 ENCOUNTER — Ambulatory Visit (HOSPITAL_COMMUNITY)
Admission: RE | Admit: 2011-01-25 | Discharge: 2011-01-25 | Disposition: A | Discharge status: Home / Self Care | Payer: Medicare Other | Source: Ambulatory Visit | Attending: Internal Medicine | Admitting: Internal Medicine

## 2011-01-25 ENCOUNTER — Other Ambulatory Visit (HOSPITAL_COMMUNITY): Payer: Self-pay | Admitting: Internal Medicine

## 2011-01-25 DIAGNOSIS — Z1231 Encounter for screening mammogram for malignant neoplasm of breast: Secondary | ICD-10-CM | POA: Insufficient documentation

## 2011-01-25 DIAGNOSIS — J449 Chronic obstructive pulmonary disease, unspecified: Secondary | ICD-10-CM | POA: Insufficient documentation

## 2011-01-25 DIAGNOSIS — J4489 Other specified chronic obstructive pulmonary disease: Secondary | ICD-10-CM | POA: Insufficient documentation

## 2011-01-25 DIAGNOSIS — R05 Cough: Secondary | ICD-10-CM | POA: Insufficient documentation

## 2011-01-25 DIAGNOSIS — F17200 Nicotine dependence, unspecified, uncomplicated: Secondary | ICD-10-CM | POA: Insufficient documentation

## 2011-01-25 DIAGNOSIS — R059 Cough, unspecified: Secondary | ICD-10-CM | POA: Insufficient documentation

## 2011-02-15 NOTE — Assessment & Plan Note (Signed)
[facility]                            CARDIOLOGY OFFICE NOTE   NAME:FORRESTRowen, [name]                      MRN:          [id number]  DATE:02/27/2007                            DOB:          [birth year]    PRIMARY CARE PHYSICIAN:  Dr. Andi Duffy.   INTERVAL HISTORY:  [name] is a delightful 70 year old woman with a  history of coronary artery disease, status post non-ST elevation  myocardial infarction in 2005 with PCA and stenting of the right  coronary.  She also has had PCA and stenting of the LAD with a drug-  eluting stent in February of 2006, and she was enrolled in the Triton  study.   MEDICAL HISTORY:  Notable for COPD with ongoing tobacco use,  hyperlipidemia, mildly elevated liver function tests, and glucose  intolerance.   She returns today for routine followup.  Overall, she is doing fairly  well.  She does get short of breath with moderate activity, but she  relates this to her COPD.  She continues to smoke a little under a pack  a day.  She denies any chest pain, no orthopnea, no PND, no lower  extremity edema.  She has been compliant with all her medications except  for her lisinopril which she stopped because she thought it was  worsening her cough.  She is asking whether or not there is a cheaper  alternative to Vytorin.   CURRENT MEDICATIONS:  1. Aspirin 81 mg a day.  2. Plavix 75 a day.  3. Toprol XL 25 a day.  4. Clonazepam.  5. Combivent.  6. Vytorin 10/40.   PHYSICAL EXAMINATION:  She is in no acute distress.  She ambulates  around the clinic without any respiratory difficulty.  Blood pressure is 114/74, heart rate of 81, weight is 126.  HEENT:  Normal except for mild telangiectasias.  NECK:  Supple.  JVP is about 6-7 cm of water.  Carotids are 2+  bilaterally without any bruits.  There is no lymphadenopathy or  thyromegaly.  CARDIAC:  She has distant heart sounds, she is regular.  PMI is  nondisplaced.   Soft S4.  No murmur.  LUNGS:  Clear with decreased breath sounds throughout.  ABDOMEN:  Soft, nontender, nondistended.  No hepatosplenomegaly, no  bruits, no masses.  Good bowel sounds.  EXTREMITIES:  Warm with no cyanosis, clubbing, or edema.  DP pulses are  2+ bilaterally, there is no rash.  NEURO:  She is alert and oriented x3.  Cranial nerves II-XII are intact.  Moves all 4 extremities without difficulty.  Affect is pleasant.   EKG shows normal sinus rhythm at a rate of 81 with no significant ST-T  wave abnormalities.   Her most recent cholesterol panel in January of 2008 showed a total  cholesterol of 161, triglycerides 213, HDL of 53, and LDL of 81.  Glucose was 147.  LFTs were normal with an AST of 24 and an ALT of 21.   ASSESSMENT AND PLAN:  1. Coronary artery disease.  This is stable without any evidence of  angina.  Continue current regimen.  2. Her ejection fraction is normal, I do not feel strongly about her      continuing her lisinopril.  3. Hypertension, well controlled.  4. Hyperlipidemia.  LDL is still not at goal.  We will go ahead and      change her to Lipitor 80.  We have gone ahead and given her      samples.  5. Tobacco use.  I have once again counseled her on the need to quit      smoking, but she is very reluctant to do so.  6. Glucose intolerance.  Her most recent glucose was 140.  This is in      the diabetic range.  She will need to follow up with Dr. Renae Duffy.     [name], MD  Electronically Signed    DRB/MedQ  DD: 02/27/2007  DT: 02/27/2007  Job #: [id number]   cc:   [name]. [name], M.D.

## 2011-02-15 NOTE — Assessment & Plan Note (Signed)
[facility]                            CARDIOLOGY OFFICE NOTE   NAME:[name], [name]                      MRN:          [id number]  DATE:06/06/2008                            DOB:          [birth year]    PRIMARY CARE PHYSICIAN:  Merlene Laughter. Renae Gloss, MD   INTERVAL HISTORY:  [name] is a very pleasant 70 year old woman with a  history of COPD with ongoing tobacco use and coronary artery disease  status post previous non-ST-elevation myocardial infarction in October  2005 with drug-eluting stent to the mid right coronary artery.  She also  had Cypher drug-eluting stent to the LAD in February 2006.  EF is about  45-50%.  She was previously in the Triton study.  She also has a history  of hyperlipidemia.  She presents today for routine followup.  Overall,  she is doing fairly well.  She denies any chest pain.  She does have  chronic shortness of breath, which sort of comes and goes.  She says it  gets better with the Spiriva.  She also notes that when she lays down at  night, she often has a cough and sometimes gets short of breath.  She  gets up and eats something and it gets better.   REVIEW OF SYSTEMS:  She denies any fevers or chills.  No claudication.  No ulceration.  She is smoking less than a pack a day, she is  considering quitting and just got the patches.  Remainder of review of  systems is negative except for HPI and problem list.   CURRENT MEDICATIONS:  1. Vytorin 10/40.  2. Combivent.  3. Metoprolol ER 12.5 mg a day.  4. Plavix 75 a day.  5. Aspirin 325 a day.  6. Spiriva.  7. Klonopin.   PHYSICAL EXAMINATION:  GENERAL:  She ambulates around the clinic without  any respiratory difficulty.  VITAL SIGNS:  Blood pressure is 120/65, heart rate is 83, weight is 122.  HEENT:  Normal.  She has a smoker's facies.  NECK:  Supple.  No JVD.  Carotids are 2+ bilaterally without any bruits.  There is no lymphadenopathy or thyromegaly.  CARDIAC:   She has distant heart sounds with regular rate and rhythm.  No  murmurs, rubs, or gallops.  LUNGS:  Clear with markedly decreased breath  sounds throughout.  No wheezing or prolonged expiratory phase.  ABDOMEN:  Soft, nontender, and nondistended.  No hepatosplenomegaly, no  bruits, no masses.  EXTREMITIES:  Warm with no cyanosis, clubbing, or  edema.  Distal pulses are 2+ bilaterally.  No rash.  NEURO:  Alert and oriented x3.  Cranial nerves II through XII are  intact.  Moves all four extremities without difficulty.  Affect is  pleasant.   ABIs are normal at 1.2 on the right and 1.1 on the left.  Abdominal  ultrasound shows no evidence of aneurysm.  Echocardiogram shows EF of 45-  50% with inferior and inferoseptal hypokinesis.   ASSESSMENT/PLAN:  1. Coronary artery disease status post previous myocardial infarction.      She is asymptomatic.  She  would like to stop her Toprol as she      cannot tolerate it very well.  I told her this is probably okay      since she is on such a low dose and not getting a benefit and it      may actually help her given her fairly significant chronic      obstructive pulmonary disease.  2. Hyperlipidemia, most recent lipids from Dr. Renae Gloss look good.  Her      LDL is 71.  Continue current therapy.  3. Chronic obstructive pulmonary disease with ongoing tobacco use.  I      once again reinforced the need for her to stop smoking.  It seems      like she may be having some reflux at night and was started on      Prilosec 20 mg a day and see if this gets better.   DISPOSITION:  We will see her back in 9 months for routine followup.     [name] Buckles. Bensimhon, MD  Electronically Signed    DRB/MedQ  DD: 06/06/2008  DT: 06/07/2008  Job #: [id number]   cc:   Merlene Laughter. Renae Gloss, M.D.

## 2011-02-15 NOTE — Assessment & Plan Note (Signed)
[facility]                            CARDIOLOGY OFFICE NOTE   NAME:[name], [name]                      MRN:          [id number]  DATE:08/07/2007                            DOB:          [birth year]    PRIMARY CARE PHYSICIAN:  Dr. Kellie Shropshire.   INTERVAL HISTORY:  [name] is a delightful 70 year old woman with a  history of coronary artery disease status post non-ST elevation  myocardial infarction in 2005 with stenting of the right coronary  artery.  She also had a PTA and stenting of the LAD with a drug-eluting  stent February 2006 as part of the Triton study.   PAST MEDICAL HISTORY:  1. COPD with ongoing tobacco use.  2. Hyperlipidemia.  3. Diabetes.   She returns today for routine followup.  She has just gotten over a  significant upper respiratory tract infection and feels she may have had  the flu.  She is starting to get her energy back.  She denies any chest  pain.  She does have some chronic dyspnea but this is unchanged.  She  does note that occasionally she gets cramps in her feet and calves, but  this is not always reproducible with walking.  She has not had any rest  pain.  No ulceration.  Denies any palpitations, no heart failure.   CURRENT MEDICATIONS:  1. Vytorin 10/40.  2. Combivent p.r.n.  3. Lorazepam 1 mg nightly  4. Toprol 25.  5. Plavix 75.  6. Aspirin 81.  7. Previously on lisinopril but stopped it due to coughing.   ALLERGIES/INTOLERANCES:  SHE WAS INTOLERANT OF LIPITOR.   PHYSICAL EXAMINATION:  She is in no acute distress, ambulates around the  clinic without any respiratory difficulty.  Blood pressure is 106/78,  heart rate is 103, weight is 119.  HEENT:  Normal except for mild telangiectasias.  NECK:  Supple, there is no JVD, carotids are 2+ bilaterally, question of  soft bruit on the right, there is no lymphadenopathy or thyromegaly.  CARDIAC:  PMI is nondisplaced, she has distant heart sounds, she is  mildly tachycardic and regular, soft S4, no murmurs.  LUNGS:  Clear with significantly decreased breath sounds throughout, no  wheezes.  ABDOMEN:  Soft, nontender, nondistended.  There is no  hepatosplenomegaly, no bruits, no masses, good bowel sounds.  EXTREMITIES:  Warm with no cyanosis, clubbing or edema.  Distal pulses  are 2+ bilaterally.  There is no rash.  NEURO:  She is alert and oriented x3, cranial nerves II-XII are intact,  moves all 4 extremities without difficulty, affect is pleasant.   EKG shows sinus tachycardia, rate of 103, no ST-T wave abnormalities.   Recent labs from Dr. Renae Gloss show normal LFTs, total cholesterol is 157,  triglycerides 169, HDL is 50, LDL is 73, fasting glucose is 142,  creatinine is 0.86, TSH is normal.   ASSESSMENT/PLAN:  1. Coronary artery disease, this is stable without any evidence of      ongoing ischemia.  Continue current therapy.  2. Dyspnea, I suspect this is mostly related to her chronic  obstructive pulmonary disease, however we have not assessed her      left ventricular function in a while.  We will get a 2D      echocardiogram.  3. Lower extremity cramping and pain.  Given her tobacco use she is at      high risk for claudication.  We will check abdominal ultrasound and      ankle-brachial indices.  4. Tobacco use, I once again reminded her of the absolute need to quit      smoking.  5. Hyperlipidemia, goal LDL of less than 70.  She is just about there,      however would consider increasing her Vytorin to 10/80.   DISPOSITION:  I will see her back in 6 months.     [name], MD  Electronically Signed    DRB/MedQ  DD: 08/07/2007  DT: 08/08/2007  Job #: [id number]   cc:   Merlene Laughter. Renae Gloss, M.D.

## 2011-02-18 NOTE — Cardiovascular Report (Signed)
NAMELAQUANNA, [name] NO.:  [id number]   MEDICAL RECORD NO.:  [id number]          PATIENT TYPE:  INP   LOCATION:  6522                         FACILITY:  [facility]   PHYSICIAN:  Salvadore Farber, M.D. LHCDATE OF BIRTH:  09/30/1941   DATE OF PROCEDURE:  11/25/2004  DATE OF DISCHARGE:                              CARDIAC CATHETERIZATION   PROCEDURES:  Coronary angiography, left heart catheterization, left  ventriculography, drug-eluting stent placement in the proximal left anterior  descending.   CARDIOLOGIST:  Salvadore Farber, M.D.   INDICATIONS:  [name] is a 70 year old lady who underwent drug-eluting  stent placement in the mid RCA after presenting with non-ST elevation MI in  October 2005.  At that time, she had minimal disease in the left system.  Since then, she has stopped smoking and has been compliant with her  medications.  Unfortunately, she presented yesterday with unstable angina.  She was referred for diagnostic angiography with a percutaneous  revascularization.   DIAGNOSTIC TECHNIQUE:  Informed consent was obtained.  Under 1% lidocaine  local anesthesia, a 5 French sheath was placed in the right common femoral  artery using modified Seldinger technique.  Diagnostic angiography and  ventriculography were performed using the JL4, JR4 and pigtail catheters.   These images demonstrated the culprit lesion to be a 99% stenosis to the  proximal LAD.  Decision was made to proceed with percutaneous  revascularization.  Anticoagulation was initiated with double bolus  eptifibatide.  Additional heparin was given to achieve and maintain an ACT  of greater than 200 seconds.  The patient was continued on her TRITON study  drug which had been initiated back in October.   Sheath was upsized over a wire to 6 [place].  A 6 French CLS 3.5 guide was  advanced over the wire and engaged in the ostium of the left main.  A  Prowater wire was advanced into the  distal LAD without difficulty.  The  lesion was predilated using a 2.0 x 9 mm Maverick inflated to 6 atmospheres.  The lesion was then stented using a 2.5 x 13 mm Cypher deployed at 18  atmospheres.  The stent was then post dilated using a 2.5 x 12 mm Quantum  positioned so as to avoid post dilation divider and of the stent.  This was  inflated to 16 atmospheres.  Final angiography demonstrated no residual  stenosis, no dissection and TIMI-3 flow to the distal vasculature.  The  stent was positioned so as to end just before the takeoff of the sizable  diagonal branch.   The patient tolerated the procedure well and was transferred to the holding  room in stable condition.   COMPLICATIONS:  None.   FINDINGS:  1.  Left ventricle:  133/12/14.  EF 48% with mild global hypokinesis.  2.  No aortic stenosis or mitral regurgitation.  3.  Left main:  Angiographically normal.  4.  Left anterior descending:  A large vessel giving rise to a single large      diagonal.  There are luminal irregularities throughout the course of the  vessel.  There was also a 99% stenosis of the proximal vessel just prior      to the take off of the diagonal.  This was stented using a drug eluting      stent with excellent angiographic result.  5.  Circumflex:  Relatively large codominant vessel giving rise to a single      obtuse marginal and a PDA.  There are minor luminal irregularities      throughout the vessel.  6.  Right coronary artery:  Relatively small but codominant vessel.  It has      diffuse mild disease.  There is a widely patent stent in the mid vessel      with no in-stent restenosis.   IMPRESSION/PLAN:  Successful percutaneous intervention of the left anterior  descending using a drug eluting stent.  The patient will be continued on  aspirin and TRITON study drug.  Given her mild LV systolic dysfunction will  initiate ACE inhibitor.      WED/MEDQ  D:  11/25/2004  T:  11/26/2004  Job:   [id number]   cc:   [name]. [name], M.D.  [address]  [address]  [place]  [place] [address]  Fax: [phone]   [name], M.D.

## 2011-02-18 NOTE — H&P (Signed)
NAME:  [name], KLUTH NO.:  [id number]   MEDICAL RECORD NO.:  [id number]          PATIENT TYPE:  EMS   LOCATION:  MAJO                         FACILITY:  [facility]   PHYSICIAN:  Michaelyn Barter, M.D. DATE OF BIRTH:  1941-09-23   DATE OF ADMISSION:  07/14/2004  DATE OF DISCHARGE:                                HISTORY & PHYSICAL   CHIEF COMPLAINT:  The patient is a 70 year old female with a past medical  history of COPD, hypercholesterolemia, and questionable hypertension, who  presented with a chief complaint of chest pain, arm pain, nausea and emesis.  She states that her chest pain started last night at 10:30 p.m.  She cannot  recall how long the pain lasted.  She states she took her home medications  including Tums and her chest pain appeared to resolve shortly thereafter.  However, at approximately 3 a.m., she was awakened by a repeat episode of  chest pain.  At this time, she took 3 ibuprofen and drank some Coke.  She  went back to sleep but during the day at approximately 4 p.m., her chest  pain returned.  She became diaphoretic, had an episode of emesis along with  the pain.  Her chest pain is described as mid sternal and heavy.  Both her  arms started hurting following the onset of pain.  The pain travels to both  her shoulders down to her wrists.  This is described as approximately 8 out  of 10 in intensity, it does not travel to her back.  She does complain of  some neck pain primarily on the left side.  She denies ever experiencing any  similar pain previously.  There are no aggravating factors.  She states that  ibuprofen and Tums did make her symptoms feel a little better.  She denies  shortness of breath.  No orthopnea, no PND, no cough.  She does have some  generalized numbness, however.  Primary care physician is Dr. Andi Devon.   PAST MEDICAL HISTORY:  1.  COPD.  2.  Hypercholesterolemia.  3.  Questionable hypertension.   PAST SURGICAL  HISTORY:  In 1974,  hysterectomy.   ALLERGIES:  No known drug allergies.   MEDICATIONS:  1.  Wellbutrin.  2.  Lopid.  3.  Claritin.  4.  Either Premarin or Prempro, the patient is not sure.   SOCIAL HISTORY:  The patient smokes approximately 1/2 to 1 pack per day of  cigarettes, she has been doing this since the age of approximately 85.  Alcohol:  Occasional wine.   FAMILY HISTORY:  Father had Alzheimer's, hypertension, multiple CVAs.  Mother had diabetes mellitus and hypertension.   REVIEW OF SYMPTOMS:  As per HPI, otherwise, all other systems are negative.   PHYSICAL EXAMINATION:  GENERAL:  The patient is cooperative.  VITAL SIGNS:  Temperature 96.7, blood pressure 140/78, heart rate 69,  respirations 18.  HEENT:  Anicteric, extraocular movements intact.  NECK:  Supple, no lymphadenopathy, thyroid not palpable, good bilateral  carotid upstrokes, no carotid bruits auscultated, no supraclavicular bruits  auscultated.  HEART:  Sounds  slightly distant.  S1 and S2 present.  Regular rate and  rhythm.  No murmurs, no gallops, no rubs.  RESPIRATORY:  Bilateral decreased breath sounds, no crackles, no wheezes.  ABDOMEN:  Soft, nontender, nondistended, positive bowel sounds.  No  appreciable organomegaly.  EXTREMITIES:  No leg edema.  MUSCULOSKELETAL:  4.5/5 bilateral upper extremity strength, 5/5 leg  strength.  NEUROLOGICAL:  Alert and oriented x 3.   LABORATORY DATA:  In the ER, CK MB 5.4, troponin I 0.14, myoglobin 39.9.  EKG revealed normal sinus rhythm with no T wave abnormalities and no ST wave  abnormalities.   ASSESSMENT/PLAN:  1.  Chest pain, etiology unknown.  Will rule out cardiac event as the      precipitating event, however.  Will order three sets of cardiac enzymes,      i.e., troponin I and CK MB.  Will admit to telemetry.  Provide oxygen      via nasal cannula.  Aspirin.  Continue nitro drip which was initially      started in the ER.  Will also provide  morphine p.r.n.  CT scan has been      ordered in the ER, will review results once completed.  Will provide      anticoagulation pending results of CT scan.  Will also consider      consulting cardiology for further recommendations.  Will order portable      chest x-ray and pursue a 2D echocardiogram.  2.  COPD.  Will continue oxygen therapy as stated in 1 above.  Will provide      Combivent MDI inhalers p.r.n.  3.  Hypercholesterolemia.  Will check fasting lipid profile in the a.m.  4.  Questionable history of hypertension, will monitor for now, will      consider adding antihypertensive medication later if needed to control      blood pressure.      Orla   OR/MEDQ  D:  07/14/2004  T:  07/14/2004  Job:  [id number]   cc:   Merlene Laughter. Renae Gloss, M.D.  [address]  [address]  [place]  [place] [address]  Fax: [phone]

## 2011-02-18 NOTE — Consult Note (Signed)
NAMEDALIYA, PARCHMENT NO.:  [id number]   MEDICAL RECORD NO.:  [id number]          PATIENT TYPE:  INP   LOCATION:  1823                         FACILITY:  [facility]   PHYSICIAN:  Vida Roller, M.D.   DATE OF BIRTH:  1941-08-25   DATE OF CONSULTATION:  DATE OF DISCHARGE:                                   CONSULTATION   PRIMARY CARE PHYSICIAN:  Merlene Laughter. Renae Gloss, M.D.   REFERRING PHYSICIAN:  Dr. Roxan Hockey, [facility].   CARDIOLOGIST:  None.   HISTORY OF PRESENT ILLNESS:  Mrs. Ibbotson is a 70 year old woman who has  relatively severe COPD and ongoing tobacco abuse who, about 24 hours ago,  had the onset of acute discomfort in the center of her chest about 10:30  last night.  She states that the discomfort was central in her chest.  The  fullness occurred at rest.  It was associated with some radiation down both  of her arms and into her jaw.  She took an aspirin and some of it resolved.  She went to sleep, woke up the next morning, had more discomfort in her  chest and this evening had an episode where the pain was very severe,  radiated down both of her arms, was associated with diaphoresis and severe  shortness of breath.  She activated the EMS systems, called 911 and  presented to the ER where she was evaluated by the [facility].  They admitted her to the hospital and subsequently, while in the ER, her  point of care enzymes became positive and we were asked to evaluate her.   CURRENT MEDICATIONS:  1.  Premarin 0.625 mg once a day.  2.  Wellbutrin 150 mg once a day.  3.  Lopid 600 mg b.i.d.  4.  She takes Claritin on a p.r.n. basis.   ALLERGIES:  No known drug allergies.   PAST MEDICAL HISTORY:  1.  COPD, the details of which are not available to me.  2.  She has history of hypercholesterolemia which is relatively severe with      cholesterols in the 400s.  She is on Lopid for that and has most recent      cholesterol of about 200.  3.   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  4.  No diabetes mellitus.  5.  She still continues to smoke.  6.  She has a family history of coronary artery disease.   PAST SURGICAL HISTORY:  She had a hysterectomy back in the 70s which was  uncomplicated.   REVIEW OF SYMPTOMS:  Her review of systems is generally negative except for  that reviewed in the history of present illness.   SOCIAL HISTORY:  She smokes about a pack a day, has since the age of 54,  which gives her a little over 75-pack-year smoking history.  She  occasionally drinks wine.  She uses no illicit drugs.   FAMILY HISTORY:  Mother has diabetes and they think died of a myocardial  infarction.   PHYSICAL EXAMINATION:  VITAL SIGNS:  Her heart rate  is 68, blood pressure  147/87, respiratory rate 12, she is afebrile.  HEENT:  Unremarkable.  NECK:  Supple.  She has no carotid bruits or jugular venous distension.  Thyroids normal size and midline.  CHEST:  Decreased breath sounds throughout with some mild wheezing at the  bases.  There are no rales noted.  CARDIOVASCULAR:  Regular rhythm with no obvious murmurs.  She has an S4 with  nondisplaced point of maximal impulse.  ABDOMEN:  Soft and nontender.  EXTREMITIES:  Her lower extremities are without significant clubbing,  cyanosis, or edema.  Her pulses are all 1+ without any bruits.  NEUROLOGIC:  Generally nonfocal.  MUSCULOSKELETAL:  Nonfocal.   Electrocardiogram shows sinus rhythm at a rate of 92 with small  insignificant Q-waves in the inferior leads.  Her intervals are normal.  Her  axes are normal.  She has mild ST depression in leads V5 and V6, most  prominent in V6 which is more pronounced on the most recent EKG which is  done at 2215 at the time she was having some mild discomfort in her chest  which is now resolved.   LABORATORY DATA:  Her hemoglobin is 15, hematocrit 44.  Sodium 135,  potassium 4.4, chloride 104, blood sugar 163.  Her point  of care enzymes  show CK 88, MB fraction 9.4 and troponin 0.33.   She had a CT angio of her chest which showed no evidence of pulmonary  embolus.  She has not had a chest x-ray.   ASSESSMENT:  This is a woman with acute coronary syndrome.  His chest pain  started more than 24 hours ago.  She is currently pain-free.  She has a  waxing and waning course.  Her point of care enzymes are mildly abnormal and  she has some ST segment depression when she has chest discomfort.  She has  COPD with ongoing tobacco abuse.  She has hyperlipidemia on Lopid and she  has labile hypertension on no medications.   PLAN:  Add aspirin, heparin and Integrilin to her medication profile.  We  are going to start her on a nitroglycerin drip to treat her hypertension as  well as her discomfort in her chest.  I am not going to use a beta-blocker  because of  her significant COPD and ongoing tobacco abuse but I think she needs a left  heart catheterization which I will set up for her tomorrow morning.  I will  hydrate her prior to that as she has had a CT angio of her chest and a  reasonable dye load with that.      Trey Paula   JH/MEDQ  D:  07/15/2004  T:  07/15/2004  Job:  [id number]   cc:   Merlene Laughter. Renae Gloss, M.D.  [address]  [address]  [place]  [place] [address]  Fax: [phone]   [facility] Dr. Roxan Hockey

## 2011-02-18 NOTE — Discharge Summary (Signed)
NAMEHOLLIN, [name] NO.:  [id number]   MEDICAL RECORD NO.:  [id number]          PATIENT TYPE:  INP   LOCATION:  2035                         FACILITY:  [facility]   PHYSICIAN:  Elliot Cousin, M.D.    DATE OF BIRTH:  [birth year]   DATE OF ADMISSION:  07/14/2004  DATE OF DISCHARGE:  07/18/2004                                 DISCHARGE SUMMARY   DISCHARGE DIAGNOSES:  1.  Acute non-ST elevation myocardial infarction.      1.  Cardiac catheterization by Dr. Rollene Rotunda on July 15, 2004.          The results revealed the left main had luminal irregularities.  The          LAD had a proximal 25% stenosis.  The first diagonal was large with          a proximal 25% stenosis.  The circumflex had ostial 25% stenosis.          The first obtuse marginal was very large and a mid 25% stenosis.          There were scant left-to-right collaterals.  The right coronary          artery was dominant with a 99% mid stenosis with TIMI-1 flow into a          small PDA and posterolaterals.  Ejection fraction 60% with mild          hypokinesis.  Conclusion:  Severe single-vessel coronary artery          disease with mild, diffuse plaque elsewhere.      2.  Status post drug-eluting stent placement in the mid right coronary          artery per Dr. Randa Evens on July 15, 2004.  2.  Enrollment in the Triton/TIMI-38 Study by [facility].  3.  Hyperlipidemia.  4.  Chronic obstructive pulmonary disease with chronic tobacco abuse.  5.  Dyspepsia secondary to constipation.   SECONDARY DISCHARGE DIAGNOSES:  1.  Post-menopausal.  2.  Seasonal allergies.  3.  Depression.   DISCHARGE MEDICATIONS:  1.  No Plavix.  Patient enrolled in Triton Study.  2.  Stop Premarin.  3.  Stop Lopid.  4.  Enteric-coated aspirin 325 mg daily.  5.  Wellbutrin 150 mg daily.  6.  Vytorin 10/40 one tablet daily.  7.  Claritin 10 mg p.r.n.  8.  Combivent MDI two puffs b.i.d.  9.  Advair 250 mg/50  mg one inhalation b.i.d.   DISCHARGE DISPOSITION:  The patient was discharged to home in improved and  stable condition.  She has a followup appointment with her primary care  physician, Dr. Renae Gloss, on October 27th at 12 noon.  She has an appointment  with cardiologist, Dr. Antoine Poche, on November 1st at 4 o'clock p.m.   PROCEDURES PERFORMED:  1.  CT scan of the chest with contrast on July 14, 2004.  No evidence of      pulmonary embolism.  COPD with minimal dependent atelectasis, right      lower lobe.  2.  Cardiac catheterization.  Results as above.  3.  Status post angioplasty of the right coronary artery with drug-eluting      stent on July 15, 2004.  Results as above.   CONSULTATIONS:  [facility].   HISTORY OF PRESENT ILLNESS:  The patient is a 70 year old lady with a past  medical history significant for COPD, hyperlipidemia, and questionable  hypertension, who presented to the emergency department on July 14, 2004,  with a chief complaint of chest pain, left arm pain, nausea, and vomiting.  The patient had risk factors for coronary artery disease and was therefore  admitted for further evaluation and management.   HOSPITAL COURSE:  1.  ACUTE NON-ST ELEVATION MYOCARDIAL INFARCTION:  The patient's EKG on      admission revealed no abnormalities.  The EKG revealed normal sinus      rhythm.  The patient was hemodynamically stable on admission.  Her blood      pressure systolically was only minimally elevated ranging between 140 to      150 systolically.  A CT scan of the chest was ordered to rule out PE      given that the patient's EKG was within normal limits.  The CT scan was      negative for pulmonary emboli; however, it did show changes consistent      with COPD.  The patient is a chronic smoker.  Initial cardiac markers      were ordered from the ED.  The CK-MB and myoglobin were within normal      limits; however, the troponin-I was mildly elevated 0.14.   Subsequently,      cardiac enzymes were ordered q.8h x3.  Prior to the results of the      cardiac enzymes, the patient was started on a nitroglycerin drip and an      aspirin 325 mg daily.  She was also treated with morphine as needed and      oxygen therapy.  Motrin was added as needed for pain and Protonix at 40      mg daily was added prophylactically.  The repeated troponin-I increased      to 0.33.  The initial CK was 88 with a CK-MB of 9.4.  Given the increase      in troponin-I, [facility] was consulted for further evaluation      and management.  Dr. Dorethea Clan provided the initial consultation.  He      recommended starting intravenous heparin and Integrilin.  These 2      medications were started.  The patient did experience intermittent chest      pain during the first 12 to 18 hours of hospitalization.  The repeated      EKG with chest pain revealed some ST depression in the posterolateral      leads.  The repeated cardiac markers were significantly elevated 8 hours      later.  The troponin-I had increased to 58.99, the CK had increased to      1208, and the CK-MB had increased to 181.  [facility]      reevaluated the patient and took the patient to the cath lab.  Dr. Rollene Rotunda catheterized the patient and found that the patient had a 99%      occlusion of the right coronary artery.  Her ejection fraction was      estimated at 60%.  There was evidence  of mild hypokinesis.  Following      the cardiac catheterization, Dr. Samule Ohm performed an angioplasty with a      successful drug-eluting stent placement in the mid right coronary artery      resulting in no residual stenosis and TIMI-3 flow to the distal      vasculature.  The patient tolerated this procedure well.  The patient      was advised to stop Premarin in the setting of an acute myocardial      infarction.  This recommendation was made following a discussion with     cardiologist, Dr. Daleen Squibb, and  the patient.  Therefore, the patient was      advised to discontinue the Premarin.  Further discussion and management      may need to continue with the patient's primary care physician, Dr.      Renae Gloss, if the patient has recurrent post-menopausal symptoms.   In the post-procedure course, the patient had very little discomfort.  She  was started on Lipitor empirically 80 mg daily.  However, the patient has a  history of mild myalgias with Lipitor.  The dose was therefore decreased to  40 mg daily.  The patient was also started on Zetia.  The patient tolerated  Lipitor very well during the hospital course.  [facility] enrolled  the patient in the Triton/TIMI-38 trial.  The patient was advised not to  take Plavix.  She was, however, prescribed the study medications.  The  patient will also be sent home on aspirin 325 mg daily.  The patient is a  long-time smoker.  She was counseled on smoking cessation prior to hospital  discharge.  The CK prior to hospital discharge was 262, CK-MB was 11.1, and  the troponin-I was 16.31.   1.  HYPERLIPIDEMIA:  The patient had been treated with Lopid in the      outpatient setting for hypertriglyceridemia.  The Lopid was discontinued      during the hospital course, and the patient was treated empirically with      Vytorin (Lipitor and Zetia).  The results of the fasting lipid profile      during the hospital course revealed a total cholesterol of 189,      triglycerides 441, and HDL cholesterol of 35.  The Lopid will be held      for now, and the patient will be continued on Vytorin.  This was      discussed with cardiologist, Dr. Daleen Squibb.   1.  COPD WITH CHRONIC TOBACCO ABUSE:  The patient was maintained on      Combivent and Advair inhalers during the hospital course.  She was      strongly admonished to stop smoking.   1.  DYSPEPSIA SECONDARY TO CONSTIPATION:  Prior to hospital discharge, the      patient had mild, diffuse abdominal pain.  A  KUB was ordered and      revealed constipation.  The amylase and lipase were within normal      limits.  The patient was treated with laxatives and stool softeners      prior to hospital discharge.  They were successful.  The patient's      abdominal pain resolved following the bowel movement.   DISCHARGE LABORATORIES:  Urinalysis:  Negative leukocytes, negative  nitrites.  Lipase 33, amylase 45.  CK 262, CK-MB 11.1, troponin-I 16.31.  WBC 10.5, hemoglobin 11.9, hematocrit 33.4, MCV 99.2, platelets 221.  Sodium  137, potassium 3.8, chloride 107, CO2 24, glucose 211, BUN 5, creatinine  0.8, calcium 8.1.       DF/MEDQ  D:  07/20/2004  T:  07/20/2004  Job:  [id number]   cc:   Vida Roller, M.D.  Fax: [phone]   Merlene Laughter. Renae Gloss, M.D.  [address]  [address]  [place]  [place] [address]  Fax: [phone]   Rollene Rotunda, M.D.

## 2011-02-18 NOTE — Cardiovascular Report (Signed)
[name], ROSENOW NO.:  [id number]   MEDICAL RECORD NO.:  [id number]          PATIENT TYPE:  INP   LOCATION:  1823                         FACILITY:  [facility]   PHYSICIAN:  Rollene Rotunda, M.D.   DATE OF BIRTH:  04/14/1941   DATE OF PROCEDURE:  07/15/2004  DATE OF DISCHARGE:                              CARDIAC CATHETERIZATION   PRIMARY CARE PHYSICIAN:  Merlene Laughter. Renae Gloss, M.D.   PROCEDURES:  Left heart catheterization/coronary arteriography.   INDICATIONS:  Evaluated patient with non-Q wave myocardial infarction.   PROCEDURE NOTE:  Left heart catheterization performed via the right femoral  artery.  The artery was cannulated using anterior wall puncture.  A 6-French  arterial sheath was inserted via the modified Seldinger technique.  Good  preformed Judkins and pigtail catheter were utilized.  The patient tolerated  the procedure well and left the lab in stable condition.   RESULTS:  HEMODYNAMICS:  LV 102/18, AO 101/79.  CORONARIES:  The left main had luminal irregularities.  The LAD had a  proximal 25% stenosis.  First diagonal was large with a proximal 25%  stenosis.  The circumflex had ostial 25% stenosis.  The AV groove had  diffuse luminal irregularities.  A first obtuse marginal was very large with  a long mid 25% stenosis.  A posterolateral was large and normal.  There were  scant left to right collaterals.  The right coronary artery was a dominant  vessel with 99% mid stenosis with TIMI I flow into a small PDA and  posterolaterales.  LEFT VENTRICULOGRAM:  The left ventriculogram was __________ in the RAO  projection.  EF 60% with mild hypokinesis.   CONCLUSION:  Severe single vessel coronary artery disease, mild diffuse  plaque elsewhere.  Mild left ventricular dysfunction.   PLAN:  The patient had percutaneous revascularization of the right coronary  artery.  She will have aggressive secondary risk reduction.      JH/MEDQ  D:  07/15/2004  T:   07/15/2004  Job:  [id number]   cc:   Merlene Laughter. Renae Gloss, M.D.  [address]  [address]  [place]  [place] [address]  Fax: [phone]

## 2011-02-18 NOTE — Assessment & Plan Note (Signed)
[facility]                              CARDIOLOGY OFFICE NOTE   NAME:[name], [name]                      MRN:          [id number]  DATE:08/17/2006                            DOB:          24-Dec-1940    PRIMARY CARE PHYSICIAN:  Merlene Laughter. Renae Gloss, M.D.   PATIENT IDENTIFICATION:  [name] is a 70 year old woman who returns for  follow-up.   PROBLEM LIST:  1. Coronary artery disease.      a.     Non-ST segment elevation myocardial infarction 2005 with       percutaneous transluminal coronary angioplasty and stenting, drug       eluting stent to the right coronary artery.      b.     Status post percutaneous transluminal coronary angioplasty and       stenting of the proximal left anterior descending in February 2006,       with a Cypher drug eluting stent and enrolled in the TRITON study.  2. Chronic obstructive pulmonary disease with ongoing tobacco use.  3. Hyperlipidemia.      a.     Most recent lipid panel from November 2007, shows total       cholesterol 159, triglycerides 144, HDL 42, and LDL of 88.  4. Mildly elevated liver panel November 2007.  AST is 64, ALT is 91.  5. Glucose intolerance with a fasting glucose of 120.   CURRENT MEDICATIONS:  1. Aspirin 325.  2. Wellbutrin 300.  3. Vytorin 10/40.  4. Combivent MDI.  5. Clonazepam 1 mg nightly.  6. Toprol XL 25 a day.  7. Lisinopril 10 a day.  8. Plavix 75 a day.   ALLERGIES:  CODEINE.   INTERVAL HISTORY:  [name] returns today for routine follow-up.  Overall, she is doing quite well.  She denies any chest pain or shortness of  breath.  She is able to do all her activities of daily living without  difficulty.  She has had a significant amount of stress in her life due to  several deaths in the family.  Unfortunately, she has gone back to smoking.  She denies claudication.  She has not had any heart failure symptoms.   PHYSICAL EXAMINATION:  GENERAL APPEARANCE:  She is  in no acute distress.  VITAL SIGNS:  Respirations are essentially unlabored but she does seem to  have some shortness of breath on longer sentences.  Blood pressure 134/80,  heart rate 76, weight 126.  HEENT:  Sclerae are anicteric.  EOMI.  There is no xanthelasma.  Moist  mucous membranes.  Oropharynx is clear.  NECK:  Supple, no JVD.  Carotids are 2+ bilaterally without any bruits.  There is no lymphadenopathy or thyromegaly.  LUNGS:  Markedly diminished breath sounds throughout with a long expiratory  phase.  CARDIOVASCULAR:  Very distant heart sounds.  Regular rate and rhythm.  No  obvious murmurs, rubs, or gallops.  ABDOMEN:  Soft, nontender, nondistended with no hepatosplenomegaly.  No  bruits, no masses.  Good bowel sounds.  EXTREMITIES:  Warm with no clubbing,  cyanosis, or edema.  Distal pulses are  1+ bilaterally.  NEUROLOGIC:  She is alert and oriented x3.  Cranial nerves II-XII intact.  She moves all four extremities without difficulty.  Affect is normal.   EKG shows normal sinus rhythm with small Q-waves inferiorly consistent with  previous inferior infarct.  No acute STT wave changes.   ASSESSMENT/PLAN:  1. Coronary artery disease.  This is quite stable.  She is on a good      medical regimen and we will continue this.  2. Hyperlipidemia.  LDL is near goal but not quite there.  We had planned      to increase her Vytorin to 10/80, however, given her elevated liver      function tests, we will keep it where it is at.  We will repeat her      liver panel and lipids in 8 weeks.  I have told her to try to be as      careful as she can with her diet.  She did not seem too eager about      this.  She denies any significant alcohol intake to account for her      elevated ALT.  3. Chronic obstructive pulmonary disease with ongoing tobacco use.  I had      a long talk with her about the need to quit smoking.  She is not      interested in that at this point.  We will get  pulmonary function tests      and check a chest x-ray.  4. Hypertension.  Blood pressure is mildly elevated today.  I suggested      that she go up on her lisinopril but she would like to wait on this and      follow up with Dr. Renae Gloss.   DISPOSITION:  We will see her back in clinic in 6 months for routine follow-  up.     [name], MD  Electronically Signed    DRB/MedQ  DD: 08/17/2006  DT: 08/17/2006  Job #: [id number]   cc:   Merlene Laughter. Renae Gloss, M.D.

## 2011-02-18 NOTE — Discharge Summary (Signed)
[name], [name]               ACCOUNT NO.:  [id number]   MEDICAL RECORD NO.:  [id number]          PATIENT TYPE:  INP   LOCATION:  6522                         FACILITY:  [facility]   PHYSICIAN:  Jesse Sans. Wall, M.D.   DATE OF BIRTH:  10-16-1940   DATE OF ADMISSION:  11/24/2004  DATE OF DISCHARGE:  11/26/2004                           DISCHARGE SUMMARY - REFERRING   PROCEDURES:  Coronary angiography/CYPHER stenting, left anterior descending,  November 25, 2004.   REASON FOR ADMISSION:  [name] is a 70 year old female, with known  coronary artery disease, status post non-ST MI/CYPHER stenting of the right  coronary artery in October 2005, who presented to the office for a scheduled  stress test for evaluation of chest and arm pain.  This revealed EKG  abnormalities and perfusion imaging suggestive of anteroapical/inferior  ischemia.  The patient was admitted directly for further management of  unstable angina pectoris.  Please refer to dictated admission note for full  details.   LABORATORY DATA:  Cardiac enzymes normal (1 set).  Potassium 4.5, BUN 12,  creatinine 0.8, glucose 121 at discharge.  Hematocrit 33, platelets 203,000  at discharge.   HOSPITAL COURSE:  Following direct admission from the office, the patient  was continued on her home medication regimen which included aspirin, TRITON  Study drug, and Vytorin, with the addition of intravenous heparin.   The following day, the patient underwent coronary angiography, by Dr.  Salvadore Farber, revealing high-grade, 99% proximal LAD stenosis with no  in-stent restenosis of the right coronary artery.  LV function was mildly  depressed (EF 48%) with mild global hypokinesis.   Dr. Samule Ohm proceeded with successful CYPHER stenting of the proximal LAD  lesion to 0% residual stenosis with no noted complications.   The patient was kept for overnight observation, and cleared for discharge  the following morning.  No  complications of the right  groin were noted.   MEDICATION ADJUSTMENTS:  1.  Addition of lisinopril 10 mg daily.  2.  The patient is to otherwise continue on TRITON Study drug and aspirin.   MEDICATIONS AT DISCHARGE:  1.  TRITON Study drug as directed.  2.  Coated aspirin 325 mg daily.  3.  Lisinopril 10 mg daily.  4.  Toprol-XL 25 mg daily.  5.  Vytorin 10/40 mg daily.  6.  Claritin 10 mg daily.  7.  Combivent two puffs b.i.d.  8.  Klonopin 1 mg daily.  9.  Premarin 0.625 mg daily.  10. Nitrostat 0.4 mg daily as directed.   INSTRUCTIONS:  No heavy lifting/driving x2 days; low-fat/-cholesterol diet;  call the office if there is any swelling/bleeding in the groin.   FOLLOWUP:  Follow up with Dr. Arvilla Meres on Thursday, December 09, 2004,  at 11:45 a.m.   DISCHARGE DIAGNOSES:  1.  Unstable angina pectoris.      1.  Early positive exercise Myoview.      2.  Status post CYPHER stenting, high-grade proximal left anterior          descending, November 25, 2004.      3.  No in-stent restenosis of right coronary artery.      4.  Mild left ventricular dysfunction (ejection fraction 48%).      5.  Status post non-ST-elevation myocardial infarction/CYPHER stent,          right coronary artery, October 2005 (TRITON Study).  2.  Dyslipidemia.  3.  History of tobacco.  4.  Hypertension.  5.  Chronic obstructive pulmonary disease.      GS/MEDQ  D:  11/26/2004  T:  11/26/2004  Job:  [id number]   cc:   Merlene Laughter. Renae Gloss, M.D.  [address]  [address]  [place]  [place] [address]  Fax: [phone]

## 2011-02-18 NOTE — H&P (Signed)
NAMEBAELEIGH, DEVINCENT               ACCOUNT NO.:  [id number]   MEDICAL RECORD NO.:  [id number]          PATIENT TYPE:  INP   LOCATION:                               FACILITY:  [facility]   PHYSICIAN:  Jesse Sans. Wall, M.D.   DATE OF BIRTH:  1940-10-18   DATE OF ADMISSION:  11/24/2004  DATE OF DISCHARGE:                                HISTORY & PHYSICAL   CHIEF COMPLAINT:  Chest discomfort with aching in both arms on Saturday  evening and last evening.   HISTORY OF PRESENT ILLNESS:  [name] is a 70 year old white female  who came to the office today after being seen several days ago for recurrent  chest discomfort.  She had a stress Cardiolite today, which was positive in  stage I with ST segment changes, extreme shortness of breath, and bilateral  arm pain.   She only exercised for 3 minutes and 30 seconds.  MET level achieved was  5.10.  Peak heart rate was 94% of predicted maximum off her Toprol.   Her images show an EF of 62% with inferior apical ischemia.   She is status post a right coronary artery Cypher stent, July 15, 2004.  A drug-eluding stent was placed at that time.  She had nonobstructive  disease in her LAD and circumflex.  She had a non-Q wave infarction at that  time prior to intervention.  EF was 60% with mild hypokinesia.   She began having chest discomfort in the office today when she was told she  needed to be admitted for a cardiac catheterization.   PAST MEDICAL HISTORY:   RISK FACTORS:  1.  Significant for age.  2.  History of hyperlipidemia.  Cholesterol was in the 400s in the past.  3.  Labile hypertension.  4.  Tobacco use.  5.  She has a family history of coronary artery disease.   SOCIAL HISTORY:  She is married.  She started smoking at age 10.  She  occasionally drinks wine.  She denies any recreational drugs.   FAMILY HISTORY:  Mother has diabetes, and may have died of a heart attack.   REVIEW OF SYSTEMS:  Noncontributory  otherwise.   CURRENT MEDICATIONS:  1.  Vytorin 10/40 daily.  2.  Claritin 10 daily.  3.  Combivent 2 puffs b.i.d.  4.  __________ study drug.  5.  Aspirin 325 daily.  6.  Klonopin 1 mg daily.  7.  Premarin 0.625 mg daily.  8.  Triple __________ 25 mg daily.   PHYSICAL EXAMINATION:  GENERAL:  She is emotion and tearful.  She is having  chest discomfort.  We gave her a nitroglycerin, and she is now receiving  Toprol and intravenous placement with subsequent heparin.  VITAL SIGNS:  Her blood pressure is 142/84, pulse 85 and regular, weight  138, respiratory rate 20 and slightly shallow.  She is afebrile.  HEENT:  Pupils equal, round and reactive to light and accommodation.  Extraocular movements are intact.  NECK:  Carotid upstrokes are equal bilaterally without bruits.  No JVD.  Thyroid is not  enlarged.  LUNGS:  Clear.  HEART:  Regular rate and rhythm.  ABDOMEN:  Soft with good bowel sounds.  There is no hepatomegaly.  EXTREMITIES:  No clubbing, cyanosis, or edema.  Pulses are brisk.  NEUROLOGIC:  Intact.   ASSESSMENT:  1.  Unstable angina with an early positive exercise stress test showing      anterior apical inferior ischemia.  Rule out RCA Cypher stent subtle      occlusion or restenosis versus progressive disease.  2.  Multiple cardiac risk factors as above.   PLAN:  The patient is going to be transported by EMS to [facility]  emergency room.  If she continues to have chest pain, she will need to go  for urgent catheterization.   I have discussed this with the patient and her family.  They understand and  agree to proceed.      TCW/MEDQ  D:  11/24/2004  T:  11/24/2004  Job:  [id number]   cc:   Michaelyn Barter, M.D.   Merlene Laughter. Renae Gloss, M.D.  [address]  [address]  [place]  [place] [address]  Fax: [phone]   Salvadore Farber, M.D. [facility]  [address]  [address]  [place]  [place] [address]

## 2011-02-18 NOTE — Assessment & Plan Note (Signed)
[facility]                                 ON-CALL NOTE   NAME:[name], [name]                      MRN:          [id number]  DATE:05/04/2007                            DOB:          22-Dec-1940    I received a page to the answering service from [name] on May 04, 2007 at 8:03 p.m.  States her primary cardiologist is Dr. Gala Romney.  She has a burst blood vessel in her eye.  [name] states she is on  Plavix and Coumadin.  Has had a stent placed approximately 2 years ago.  She had been working in the garden yesterday.  Also has a chronic cough  secondary to COPD/ongoing tobacco use.  She states, yesterday, that her  daughter mentioned her eye was red.  The patient noted the outer right  eye, a blood vessel had ruptured.  She denies any visual disturbances,  any pain in the eye, or any active bleeding.  There is no redness in the  left eye and there is no discharge from either eye.  I spoke with Dr.  Andee Lineman.  We reviewed the patient's previous cardiac catheterization from  2005 and 2006.  He felt that the patient could safely stop her Plavix at  this time.  She will need to follow up with her optometrist on Monday,  and then follow up with Dr. Gala Romney.  I relayed this information to  [name].  She agreed.   I told her that if:  1. The area became worse in appearance;  2. If she had any visual disturbances; or  3. The redness spread to the other eye, she needed to go ahead and      come to the emergency room this weekend to get evaluated.   She will call her eye doctor on Monday for an appointment, and I will  leave a message with Dr. Prescott Gum nurse to let him know what the  patient was told in regard to her Plavix, and to expect the patient to  follow up with him.      Dorian Pod, ACNP  Electronically Signed      Learta Codding, MD,FACC  Electronically Signed   MB/MedQ  DD: 05/04/2007  DT: 05/05/2007  Job #:  [id number]

## 2011-02-18 NOTE — Op Note (Signed)
NAMEOPLE, GIRGIS NO.:  [id number]   MEDICAL RECORD NO.:  [id number]          PATIENT TYPE:  INP   LOCATION:  1823                         FACILITY:  [facility]   PHYSICIAN:  Salvadore Farber, M.D. LHCDATE OF BIRTH:  [birth year]   DATE OF PROCEDURE:  07/15/2004  DATE OF DISCHARGE:                                 OPERATIVE REPORT   PROCEDURE:  Drug-eluting stent placement in the mid right coronary artery.   INDICATIONS FOR PROCEDURE:  Ms. Milliner is a 70 year old lady who presents  with non-ST segment elevation myocardial infarction.  Dr. Antoine Poche performed  diagnostic catheterization this morning demonstrating a 99% stenosis in the  mid right coronary artery with TIMI-1 flow to the distal vasculature.  There  were no significant collaterals.  He asked me to perform percutaneous  revascularization.   DESCRIPTION OF PROCEDURE:  Informed consent was obtained.  Additional  heparin was given to achieve and maintain an ACT of greater than 200  seconds.  The patient had received a single bolus of up eptifibatide in the  emergency room approximately eight hours previous.  A second bolus was  administered as was TRITON study drug prior to initiation of the procedure.  Via the pre-existing #6 [place] sheath, a #6 [place] JR4 guide was advanced  over a wire and engaged in the ostium of the RCA.  __________ wire was  advanced without difficulty into the distal RCA.  The lesion was predilated  using a 2.0 x 9 mm Maverick at 6 atmospheres.  Balloon dilated fully.  Intracoronary nitroglycerin was then administered to dilate the vessel.  The  vessel appeared only to be 2.5 mm in diameter.  The lesion was then stented  using a 2.5 x 18 mm CYPHER at 16 atmospheres.  The mid portion of the stent  was then post dilated using a 2.5 mm power  cell at 18 atmospheres.  Final  angiography demonstrated no residual stenosis and TIMI-3 flow to the distal  vasculature.  There was a 70% ostial  stenosis of the jailed acute marginal  but persistent TIMI-3 flow in this vessel.   COMPLICATIONS:  None.   IMPRESSION/RECOMMENDATIONS:  Successful drug eluting stent placement in the  mid right coronary artery resulting in no residual stenosis and TIMI-3 flow  to the distal vasculature.  Given her presentation with acute coronary  syndrome, would recommend treatment with Plavix for a minimum of one year  per the CURE data.  Aspirin should be continued indefinitely.       WED/MEDQ  D:  07/15/2004  T:  07/15/2004  Job:  [id number]   cc:   Merlene Laughter. Renae Gloss, M.D.  [address]  [address]  [place]  [place] [address]  Fax: [phone]   Rollene Rotunda, M.D.

## 2011-03-03 ENCOUNTER — Telehealth: Payer: Self-pay | Admitting: Internal Medicine

## 2011-03-03 NOTE — Telephone Encounter (Signed)
Pt returning call.[name] ° °

## 2011-03-03 NOTE — Telephone Encounter (Signed)
LMTCB

## 2011-03-03 NOTE — Telephone Encounter (Signed)
Spoke with pt.  She states that she had cxr done at [facility] order per Dr Renae Gloss just as a followup cxr. She states that she wanted to know if CDY has received copy of the report or if he would be willing to look at this film and let her know how it looked. Please advise thanks

## 2011-03-07 NOTE — Telephone Encounter (Signed)
Per Clenton Pare is aware of message.  Will continue to hold.

## 2011-03-09 NOTE — Telephone Encounter (Signed)
Called spoke with patient, advised of cxr results as stated by CDY.  Pt verbalized her understanding and denied any questions or need to be seen at this time.

## 2011-03-09 NOTE — Telephone Encounter (Signed)
CXR was clear with no active disease. The lungs are a little overinflated, as seen with COPD. We can discuss here, but no immediate change to recommend.

## 2011-03-21 ENCOUNTER — Encounter: Payer: Self-pay | Admitting: Internal Medicine

## 2011-03-21 ENCOUNTER — Ambulatory Visit (INDEPENDENT_AMBULATORY_CARE_PROVIDER_SITE_OTHER): Payer: Medicare Other | Admitting: Internal Medicine

## 2011-03-21 VITALS — BP 124/66 | HR 79 | Ht 61.0 in | Wt 122.8 lb

## 2011-03-21 DIAGNOSIS — J449 Chronic obstructive pulmonary disease, unspecified: Secondary | ICD-10-CM

## 2011-03-21 DIAGNOSIS — F17200 Nicotine dependence, unspecified, uncomplicated: Secondary | ICD-10-CM

## 2011-03-21 NOTE — Assessment & Plan Note (Signed)
We have reviewed meds. She can use Advair once daily during quiet seasons.  Continue using Oxygen for sleep.

## 2011-03-21 NOTE — Assessment & Plan Note (Signed)
Continued expressed concern and attempts to educate and motivate, but she has not been willing to commit to quit.

## 2011-03-21 NOTE — Patient Instructions (Signed)
Please call as needed 

## 2011-03-21 NOTE — Progress Notes (Signed)
  Subjective:    Patient ID: [name], female    DOB: Sep 13, 1941, 70 y.o.   MRN: [id number]  HPI 03/21/11- 70 yoF followed for COPD, ongoing tobacco use, complicated by CAD Last here Nov 18, 2010- note reviewed.  Continues in a Spiriva drug trial CXR 01/25/11 - stable COPD, NAD She continues to find that sleeping with oxygen makes her feel much better. Continues Advair and Spiriva regularly. Rare need for rescue inhaler.  Review of Systems Constitutional:   No weight loss, night sweats,  Fevers, chills, fatigue, lassitude. HEENT:   No headaches,  Difficulty swallowing,  Tooth/dental problems,  Sore throat,                No sneezing, itching, ear ache, nasal congestion, post nasal drip,   CV:  No chest pain,  Orthopnea, PND, swelling in lower extremities, anasarca, dizziness, palpitations  GI  No heartburn, indigestion, abdominal pain, nausea, vomiting, diarrhea, change in bowel habits, loss of appetite  Resp:   No excess mucus,,  No coughing up of blood.  No change in color of mucus.   Skin: no rash or lesions.  GU: no dysuria, change in color of urine, no urgency or frequency.  No flank pain.  MS:  No joint pain or swelling.  No decreased range of motion.  No back pain.  Psych:  No change in mood or affect. No depression or anxiety.  No memory loss.     Objective:   Physical Exam General- Alert, Oriented, Affect-appropriate, Distress- none acute  Skin- rash-none, lesions- none, excoriation- none  Lymphadenopathy- none  Head- atraumatic  Eyes- Gross vision intact, PERRLA, conjunctivae clear secretions  Ears- Hearing, canals, Tm - normal  Nose- Clear, No- Septal dev, mucus, polyps, erosion, perforation   Throat- Mallampati II , mucosa clear , drainage- none, tonsils- atrophic  Neck- flexible , trachea midline, no stridor , thyroid nl, carotid no bruit  Chest - symmetrical excursion , unlabored     Heart/CV- RRR , no murmur , no gallop  , no rub, nl s1 s2                  - JVD- none , edema- none, stasis changes- none, varices- none     Lung- diminished with deep, non-productive cough,          dullness-none, rub- none     Chest wall-   Abd- tender-no, distended-no, bowel sounds-present, HSM- no  Br/ Gen/ Rectal- Not done, not indicated  Extrem- cyanosis- none, clubbing, none, atrophy- none, strength- nl  Neuro- grossly intact to observation         Assessment & Plan:

## 2011-07-04 ENCOUNTER — Ambulatory Visit
Admission: RE | Admit: 2011-07-04 | Discharge: 2011-07-04 | Disposition: A | Discharge status: Home / Self Care | Payer: Medicare Other | Source: Ambulatory Visit | Attending: Internal Medicine | Admitting: Internal Medicine

## 2011-07-04 ENCOUNTER — Other Ambulatory Visit: Payer: Self-pay | Admitting: Internal Medicine

## 2011-07-04 DIAGNOSIS — J449 Chronic obstructive pulmonary disease, unspecified: Secondary | ICD-10-CM

## 2011-07-04 DIAGNOSIS — F17200 Nicotine dependence, unspecified, uncomplicated: Secondary | ICD-10-CM

## 2011-07-04 DIAGNOSIS — R05 Cough: Secondary | ICD-10-CM

## 2011-07-04 DIAGNOSIS — R059 Cough, unspecified: Secondary | ICD-10-CM

## 2011-07-04 DIAGNOSIS — R0602 Shortness of breath: Secondary | ICD-10-CM

## 2011-07-08 ENCOUNTER — Ambulatory Visit (INDEPENDENT_AMBULATORY_CARE_PROVIDER_SITE_OTHER): Payer: Medicare Other | Admitting: Internal Medicine

## 2011-07-08 ENCOUNTER — Encounter: Payer: Self-pay | Admitting: Internal Medicine

## 2011-07-08 ENCOUNTER — Other Ambulatory Visit (INDEPENDENT_AMBULATORY_CARE_PROVIDER_SITE_OTHER): Payer: Medicare Other

## 2011-07-08 VITALS — BP 130/78 | HR 99 | Temp 98.2°F | Ht 61.0 in | Wt 101.4 lb

## 2011-07-08 DIAGNOSIS — R002 Palpitations: Secondary | ICD-10-CM

## 2011-07-08 DIAGNOSIS — J449 Chronic obstructive pulmonary disease, unspecified: Secondary | ICD-10-CM

## 2011-07-08 LAB — CBC WITH DIFFERENTIAL/PLATELET
Basophils Absolute: 0 10*3/uL (ref 0.0–0.1)
Eosinophils Relative: 1.4 % (ref 0.0–5.0)
Monocytes Absolute: 0.6 10*3/uL (ref 0.1–1.0)
Monocytes Relative: 6.8 % (ref 3.0–12.0)
Neutrophils Relative %: 67.2 % (ref 43.0–77.0)
Platelets: 201 10*3/uL (ref 150.0–400.0)
RDW: 12.9 % (ref 11.5–14.6)
WBC: 8.2 10*3/uL (ref 4.5–10.5)

## 2011-07-08 LAB — COMPREHENSIVE METABOLIC PANEL
ALT: 17 U/L (ref 0–35)
Albumin: 3.9 g/dL (ref 3.5–5.2)
Alkaline Phosphatase: 63 U/L (ref 39–117)
CO2: 37 mEq/L — ABNORMAL HIGH (ref 19–32)
Glucose, Bld: 147 mg/dL — ABNORMAL HIGH (ref 70–99)
Potassium: 4.4 mEq/L (ref 3.5–5.1)
Sodium: 138 mEq/L (ref 135–145)
Total Protein: 6.9 g/dL (ref 6.0–8.3)

## 2011-07-08 LAB — D-DIMER, QUANTITATIVE: D-Dimer, Quant: 0.33 ug/mL-FEU (ref 0.00–0.48)

## 2011-07-08 MED ORDER — METHYLPREDNISOLONE ACETATE 80 MG/ML IJ SUSP
80.0000 mg | Freq: Once | INTRAMUSCULAR | Status: AC
  Administered 2011-07-08: 80 mg via INTRAMUSCULAR

## 2011-07-08 NOTE — Progress Notes (Signed)
Patient ID: [name], female    DOB: [birth year], 70 y.o.   MRN: [id number]  HPI 03/21/11- 21 yoF followed for COPD, ongoing tobacco use, complicated by CAD Last here Nov 18, 2010- note reviewed.  Continues in a Spiriva drug trial CXR 01/25/11 - stable COPD, NAD She continues to find that sleeping with oxygen makes her feel much better. Continues Advair and Spiriva regularly. Rare need for rescue inhaler.  07/08/11 69 yoF followed for COPD, ongoing tobacco use, complicated by CAD, DM   Husband and daughter here Acute visit- Dyspnea   She complains of feeling somehow bad with more short of breath particularly in the last 2-3 weeks. Her husband says she has been steadily declining, weaker, for 4 or 5 months. She complains of pain in her left bicep if she tries to reach around to her back but this is not exertional pain otherwise and she denies angina-type pain or pleuritic pain. Mainly she is complaining of shortness of breath. There is some increased cough with scant clear mucus. She gets a burning sensation in the upper sternum just in the last few days. Some nausea last week. She was seen at her primary care office 4 days ago , given a Rocephin injection and started on Levaquin. Chest x-ray was done and she was told it was clear. She denies swollen glands, rash, nausea or vomiting, change in bowel or bladder , swelling or aching in legs. EKG- RSR, RAE, NAD  Review of Systems Constitutional:   No-   weight loss, night sweats, fevers, chills,+ fatigue, lassitude. HEENT:   No-  headaches, difficulty swallowing, tooth/dental problems, sore throat,       No-  sneezing, itching, ear ache, nasal congestion, post nasal drip,  CV:  No-   chest pain, orthopnea, PND, swelling in lower extremities, dizziness, palpitations Resp: +  shortness of breath with exertion or at rest.              +  productive cough,  No non-productive cough,  No-  coughing up of blood.              No-   change in color of  mucus.  No- wheezing.   Skin: No-   rash or lesions. GI:  No-   heartburn, indigestion, abdominal pain, nausea, vomiting, diarrhea,                 change in bowel habits, loss of appetite GU: No-   dysuria, change in color of urine, no urgency or frequency.  No- flank pain. MS:  No-   joint pain or swelling.  No- decreased range of motion.  No- back pain. Neuro- grossly normal to observation, Or:  Psych:  No- change in mood or affect. No depression or anxiety.  No memory loss.   OBJ General- Alert, Oriented, she is found lying on my exam table, wearing her oxygen; able to sit up without assistance and to climb down from the table Skin- rash-none, lesions- none, excoriation- none Lymphadenopathy- none Head- atraumatic            Eyes- Gross vision intact, PERRLA, conjunctivae clear secretions            Ears- Hearing, canals- normal            Nose- Clear, no-Septal dev, mucus, polyps, erosion, perforation             Throat- Mallampati II , mucosa clear , drainage- none, tonsils-  atrophic Neck- flexible , trachea midline, no stridor , thyroid nl, carotid no bruit Chest - symmetrical excursion , unlabored           Heart/CV- RRR , no murmur , no gallop  , no rub, nl s1 s2                           - JVD- none , edema- none, stasis changes- none, varices- none           Lung- clear to P&A, wheeze- none, cough- none , dullness-none, rub- none           Chest wall-  Abd- tender-no, distended-no, bowel sounds-present, HSM- no Br/ Gen/ Rectal- Not done, not indicated Extrem- cyanosis- none, clubbing, none, atrophy- none, strength- nl Neuro- grossly intact to observation

## 2011-07-08 NOTE — Progress Notes (Signed)
  Subjective:    Patient ID: [name], female    DOB: 1941/03/17, 70 y.o.   MRN: [id number]  HPI    Review of Systems     Objective:   Physical Exam   SATURATION QUALIFICATIONS:  Patient Saturations on Room Air while Ambulating = 78%  Patient Saturations on 3 Liters of oxygen while Ambulating = 93%Joseluis Alessio,CMA   Patients spouse took patient off of her O2 prior to OV today; pt entered exam room with a sat of 78%, I placed patient on her Oxygen at 2L/M-sat of 83%; I increased and maintained patient on 3L/M O2 until seen by physician with sat of 93%. Vivianne Spence        Assessment & Plan:

## 2011-07-08 NOTE — Patient Instructions (Addendum)
Order- lab- CBC w/ diff,  CMET, D-dimer,   Encourage fluids  Ok to stay on oxygen at 2 L/m until you feel better  Depo 80

## 2011-07-10 NOTE — Assessment & Plan Note (Signed)
Today's presentation is nonspecific. She may be describing gradual decline over the last several months related to her COPD. She is now on antibiotics without clear evidence of infection. I can't see that there has been a cardiopulmonary change except that she is weaker or more short of breath.  we will check labs for a metabolic problem and reassess. We discussed a steroid injection as a nonspecific therapeutic attempt and reviewed the impact on blood sugar.

## 2011-07-13 NOTE — Progress Notes (Signed)
Quick Note:  Pt aware of results. ______ 

## 2011-07-15 ENCOUNTER — Encounter: Payer: Self-pay | Admitting: Internal Medicine

## 2011-07-15 ENCOUNTER — Ambulatory Visit (INDEPENDENT_AMBULATORY_CARE_PROVIDER_SITE_OTHER): Payer: Medicare Other | Admitting: Internal Medicine

## 2011-07-15 VITALS — BP 106/64 | HR 74 | Ht 61.0 in | Wt 117.4 lb

## 2011-07-15 DIAGNOSIS — F17200 Nicotine dependence, unspecified, uncomplicated: Secondary | ICD-10-CM

## 2011-07-15 DIAGNOSIS — J449 Chronic obstructive pulmonary disease, unspecified: Secondary | ICD-10-CM

## 2011-07-15 DIAGNOSIS — Z23 Encounter for immunization: Secondary | ICD-10-CM

## 2011-07-15 NOTE — Assessment & Plan Note (Signed)
Counseling done again. She has opted not to do pulmonary rehab now, but may be willing to learn about the [facility] smoking cessation program- info given.

## 2011-07-15 NOTE — Progress Notes (Signed)
Patient ID: [name], female    DOB: 04/18/1941, 70 y.o.   MRN: [id number]  HPI 03/21/11- 85 yoF followed for COPD, ongoing tobacco use, complicated by CAD Last here Nov 18, 2010- note reviewed.  Continues in a Spiriva drug trial CXR 01/25/11 - stable COPD, NAD She continues to find that sleeping with oxygen makes her feel much better. Continues Advair and Spiriva regularly. Rare need for rescue inhaler.  07/08/11 69 yoF followed for COPD, ongoing tobacco use, complicated by CAD, DM   Husband and daughter here Acute visit- Dyspnea   She complains of feeling somehow bad with more short of breath particularly in the last 2-3 weeks. Her husband says she has been steadily declining, weaker, for 4 or 5 months. She complains of pain in her left bicep if she tries to reach around to her back but this is not exertional pain otherwise and she denies angina-type pain or pleuritic pain. Mainly she is complaining of shortness of breath. There is some increased cough with scant clear mucus. She gets a burning sensation in the upper sternum just in the last few days. Some nausea last week. She was seen at her primary care office 4 days ago , given a Rocephin injection and started on Levaquin. Chest x-ray was done and she was told it was clear. She denies swollen glands, rash, nausea or vomiting, change in bowel or bladder , swelling or aching in legs. EKG- RSR, RAE, NAD  07/15/11-69 yoF followed for COPD, ongoing tobacco use, complicated by CAD, DM..........Marland Kitchenhusband here Much stronger and feeling much better than last time. Has reduced but not quit smoking-discussed. Notes desat walking through home on room air to as low as 83%. Discussed use of O2 for goal sat >/= 88% and discussed CO2 retention. Coughs up a little thick clear mucus.   Review of Systems Constitutional:   No-   weight loss, night sweats, fevers, chills,+ fatigue, lassitude. HEENT:   No-  headaches, difficulty swallowing, tooth/dental  problems, sore throat,       No-  sneezing, itching, ear ache, nasal congestion, post nasal drip,  CV:  No-   chest pain, orthopnea, PND, swelling in lower extremities, dizziness, palpitations Resp: +  shortness of breath with exertion or at rest.              +  productive cough,  No non-productive cough,  No-  coughing up of blood.              No-   change in color of mucus.  No- wheezing.   Skin: No-   rash or lesions. GI:  No-   heartburn, indigestion, abdominal pain, nausea, vomiting, diarrhea,                 change in bowel habits, loss of appetite GU: No-   dysuria, change in color of urine, no urgency or frequency.  No- flank pain. MS:  No-   joint pain or swelling.  No- decreased range of motion.  No- back pain. Neuro- grossly normal to observation, Or:  Psych:  No- change in mood or affect. No depression or anxiety.  No memory loss.   OBJ General- Alert, Oriented, Much stronger. Room air sat 91% Skin- rash-none, lesions- none, excoriation- none Lymphadenopathy- none Head- atraumatic            Eyes- Gross vision intact, PERRLA, conjunctivae clear secretions  Ears- Hearing, canals- normal            Nose- Clear, no-Septal dev, mucus, polyps, erosion, perforation             Throat- Mallampati II , mucosa clear , drainage- none, tonsils- atrophic Neck- flexible , trachea midline, no stridor , thyroid nl, carotid no bruit Chest - symmetrical excursion , unlabored           Heart/CV- RRR , no murmur , no gallop  , no rub, nl s1 s2                           - JVD- none , edema- none, stasis changes- none, varices- none           Lung- clear to P&A, wheeze- none, cough- none , dullness-none, rub- none           Chest wall-  Abd- tender-no, distended-no, bowel sounds-present, HSM- no Br/ Gen/ Rectal- Not done, not indicated Extrem- cyanosis- none, clubbing, none, atrophy- none, strength- nl Neuro- grossly intact to observation

## 2011-07-15 NOTE — Patient Instructions (Signed)
Flu vax  Info sheet on [facility] smoking cessation program  Goal for oxygen is to try keep saturation over 88%

## 2011-07-15 NOTE — Assessment & Plan Note (Signed)
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 Elevated serum bicarb last week implies some CO2 retention. Probably acute exacerbation was viral. We reviewed her labs from last visit. Discussed steroid therapy.

## 2011-07-15 NOTE — Progress Notes (Signed)
Addended by: Marcellus Scott on: 07/15/2011 10:40 AM   Modules accepted: Orders

## 2011-09-12 ENCOUNTER — Telehealth: Payer: Self-pay | Admitting: Internal Medicine

## 2011-09-12 MED ORDER — OSELTAMIVIR PHOSPHATE 75 MG PO CAPS: 75.0000 mg | ORAL_CAPSULE | Freq: Two times a day (BID) | ORAL | Status: AC

## 2011-09-12 NOTE — Telephone Encounter (Signed)
Spoke with pt and she states that she has had low grade fever, prod cough with large amount of green/yellow sputum and aches x 4 days- taking mucinex and aleve for these symptoms. She wonders if she has the flu. I advised will forward msg to CDY for recs. Please advise, thanks! Allergies  Allergen Reactions  . Codeine

## 2011-09-12 NOTE — Telephone Encounter (Signed)
Per [facility] tamiflu 75 mg #10 1 bid

## 2011-09-12 NOTE — Telephone Encounter (Signed)
I spoke with pt and is aware of cdy recs. Rx has been sent.

## 2011-09-16 ENCOUNTER — Ambulatory Visit: Payer: Medicare Other | Admitting: Internal Medicine

## 2011-10-19 ENCOUNTER — Other Ambulatory Visit: Payer: Self-pay | Admitting: Cardiology

## 2011-10-19 DIAGNOSIS — I6529 Occlusion and stenosis of unspecified carotid artery: Secondary | ICD-10-CM

## 2011-10-20 ENCOUNTER — Encounter (INDEPENDENT_AMBULATORY_CARE_PROVIDER_SITE_OTHER): Payer: Medicare Other | Admitting: Cardiology

## 2011-10-20 DIAGNOSIS — I6529 Occlusion and stenosis of unspecified carotid artery: Secondary | ICD-10-CM

## 2011-10-28 ENCOUNTER — Ambulatory Visit: Payer: Medicare Other | Admitting: Internal Medicine

## 2011-10-31 ENCOUNTER — Encounter: Payer: Self-pay | Admitting: Internal Medicine

## 2011-10-31 ENCOUNTER — Ambulatory Visit (INDEPENDENT_AMBULATORY_CARE_PROVIDER_SITE_OTHER): Payer: Medicare Other | Admitting: Internal Medicine

## 2011-10-31 VITALS — BP 98/60 | HR 72 | Ht 61.0 in | Wt 113.0 lb

## 2011-10-31 DIAGNOSIS — F17200 Nicotine dependence, unspecified, uncomplicated: Secondary | ICD-10-CM

## 2011-10-31 DIAGNOSIS — J449 Chronic obstructive pulmonary disease, unspecified: Secondary | ICD-10-CM

## 2011-10-31 MED ORDER — ALBUTEROL SULFATE (2.5 MG/3ML) 0.083% IN NEBU: 2.5000 mg | INHALATION_SOLUTION | Freq: Four times a day (QID) | RESPIRATORY_TRACT | Status: DC | PRN

## 2011-10-31 NOTE — Assessment & Plan Note (Signed)
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 Gold stage IV. Unfortunately we cannot convince her to stop her last few cigarettes per day. I discussed this again with her husband present. I'm going to try again to steer her to [facility]'s  pulmonary rehabilitation program

## 2011-10-31 NOTE — Progress Notes (Signed)
Patient ID: [name], female    DOB: May 16, 1941, 71 y.o.   MRN: [id number]  HPI 03/21/11- 18 yoF followed for COPD, ongoing tobacco use, complicated by CAD Last here Nov 18, 2010- note reviewed.  Continues in a Spiriva drug trial CXR 01/25/11 - stable COPD, NAD She continues to find that sleeping with oxygen makes her feel much better. Continues Advair and Spiriva regularly. Rare need for rescue inhaler.  07/08/11 69 yoF followed for COPD, ongoing tobacco use, complicated by CAD, DM   Husband and daughter here Acute visit- Dyspnea   She complains of feeling somehow bad with more short of breath particularly in the last 2-3 weeks. Her husband says she has been steadily declining, weaker, for 4 or 5 months. She complains of pain in her left bicep if she tries to reach around to her back but this is not exertional pain otherwise and she denies angina-type pain or pleuritic pain. Mainly she is complaining of shortness of breath. There is some increased cough with scant clear mucus. She gets a burning sensation in the upper sternum just in the last few days. Some nausea last week. She was seen at her primary care office 4 days ago , given a Rocephin injection and started on Levaquin. Chest x-ray was done and she was told it was clear. She denies swollen glands, rash, nausea or vomiting, change in bowel or bladder , swelling or aching in legs. EKG- RSR, RAE, NAD  07/15/11-69 yoF followed for COPD, ongoing tobacco use, complicated by CAD, DM..........Marland Kitchenhusband here Much stronger and feeling much better than last time. Has reduced but not quit smoking-discussed. Notes desat walking through home on room air to as low as 83%. Discussed use of O2 for goal sat >/= 88% and discussed CO2 retention. Coughs up a little thick clear mucus.   10/31/11- 69 yoF followed for COPD, ongoing tobacco use, complicated by CAD, DM..........Marland Kitchenhusband here In past week just gotten over the breathing concerns since seen last  time and in December (when had flu like symptoms); Has been using O2 more-having low O2 levels when not on O2.(low as 75%). Unfortunately she has continued to smoke a few cigarettes daily against advice. She has been dealing with a sustained exacerbation of COPD really since October. Using oxygen continuously between 2 and 3 L per minute/[facility]. We talked about pulmonary rehabilitation. She never took the invitation to try [facility] program. She said then that [facility] but she didn't do that either. She paces herself at home wearing oxygen, to do limited housework. She can move a vacuum cleaner but not move furniture. Bothersome pain in the left shoulder with decreased range of motion. Little cough, phlegm, exertional chest pain, edema. No blood. PFT-09/06/2010-reviewed again with her and her husband. Very severe obstructive airways disease, emphysema pattern, FEV1/FVC 0.28, DLCO 42%. She has a home nebulizer machine with albuterol and that works better for her than metered-dose inhalers.   Review of Systems Constitutional:   No-   weight loss, night sweats, fevers, chills,+ fatigue, lassitude. HEENT:   No-  headaches, difficulty swallowing, tooth/dental problems, sore throat,       No-  sneezing, itching, ear ache, nasal congestion, post nasal drip,  CV:  No-   chest pain, orthopnea, PND, swelling in lower extremities, dizziness, palpitations Resp: +  shortness of breath with exertion or at rest.              +  productive cough,  No non-productive cough,  No-  coughing up of blood.              No-   change in color of mucus.  No- wheezing.   Skin: No-   rash or lesions. GI:  No-   heartburn, indigestion, abdominal pain, nausea, vomiting, diarrhea,                 change in bowel habits, loss of appetite GU: No-   dysuria, change in color of urine, no urgency or frequency.  No- flank pain. MS:  No-   joint pain or swelling.  No- decreased range of motion.  No-  back pain. Neuro- grossly normal to observation, Or:  Psych:  No- change in mood or affect. No depression or anxiety.  No memory loss.   OBJ General- Alert, Oriented, Much stronger.  O2 2L sat 91% Skin- rash-none, lesions- none, excoriation- none Lymphadenopathy- none Head- atraumatic            Eyes- Gross vision intact, PERRLA, conjunctivae clear secretions            Ears- Hearing, canals- normal            Nose- Clear, no-Septal dev, mucus, polyps, erosion, perforation             Throat- Mallampati II , mucosa clear , drainage- none, tonsils- atrophic Neck- flexible , trachea midline, no stridor , thyroid nl, carotid no bruit Chest - symmetrical excursion , unlabored           Heart/CV- RRR , no murmur , no gallop  , no rub, nl s1 s2                           - JVD- none , edema- none, stasis changes- none, varices- none           Lung- very distant,  + loose upper airway rattle, wheeze- none, cough- none , dullness-none, rub- none           Chest wall-  Abd-  Br/ Gen/ Rectal- Not done, not indicated Extrem- cyanosis- none, clubbing, none, atrophy- none, strength- nl Neuro- grossly intact to observation

## 2011-10-31 NOTE — Assessment & Plan Note (Signed)
I have reinforced previous discussions about smoking cessation and support.

## 2011-10-31 NOTE — Patient Instructions (Addendum)
Order- [facility] refer to [facility]  Gold Stage IV COPD  Ask Dr Renae Gloss about referral to an Orthopedist about your chronic left shoulder pain.

## 2011-11-04 ENCOUNTER — Encounter: Payer: Self-pay | Admitting: Internal Medicine

## 2011-11-07 ENCOUNTER — Telehealth: Payer: Self-pay | Admitting: Internal Medicine

## 2011-11-07 NOTE — Telephone Encounter (Signed)
New problem Pt wants to know should she schedule with Dr Gala Romney at the hospital. I old the next available was June and she wanted to talk to you

## 2011-11-07 NOTE — Telephone Encounter (Signed)
I told her I would check with Dr Gala Romney and call her back.

## 2011-11-09 NOTE — Telephone Encounter (Signed)
Pt was notified that she needs to establish with a new cardiologist.

## 2011-11-09 NOTE — Telephone Encounter (Signed)
Please schedule an appt for her to establish with a new cardiologist per Dr Gala Romney.  Thanks.

## 2011-11-24 ENCOUNTER — Encounter: Payer: Self-pay | Admitting: Cardiovascular Disease

## 2011-11-24 ENCOUNTER — Ambulatory Visit (INDEPENDENT_AMBULATORY_CARE_PROVIDER_SITE_OTHER): Payer: Medicare Other | Admitting: Cardiovascular Disease

## 2011-11-24 ENCOUNTER — Telehealth: Payer: Self-pay | Admitting: Internal Medicine

## 2011-11-24 VITALS — BP 140/80 | HR 100 | Ht 61.0 in | Wt 113.0 lb

## 2011-11-24 DIAGNOSIS — I2510 Atherosclerotic heart disease of native coronary artery without angina pectoris: Secondary | ICD-10-CM

## 2011-11-24 DIAGNOSIS — I6529 Occlusion and stenosis of unspecified carotid artery: Secondary | ICD-10-CM

## 2011-11-24 DIAGNOSIS — J449 Chronic obstructive pulmonary disease, unspecified: Secondary | ICD-10-CM

## 2011-11-24 MED ORDER — SIMVASTATIN 20 MG PO TABS: 20.0000 mg | ORAL_TABLET | Freq: Every evening | ORAL | Status: DC

## 2011-11-24 NOTE — Telephone Encounter (Signed)
LMOM for pt TCB 

## 2011-11-24 NOTE — Telephone Encounter (Signed)
Called spoke with patient with patient who is requesting a smaller portable o2 tank (currently using the eclipse) as this is too large for her when "out-and-about."  Ok to [facility] if calling after 3pm.  Dr Maple Hudson please advise, thanks!

## 2011-11-24 NOTE — Progress Notes (Signed)
History of Present Illness: 71 yo WF with prior history of HTN, HLD, COPD and CAD status post stenting of right coronary artery in 2005 and LAD in 2006 here for cardiac follow up. She has been followed in the past by Dr. Gala Romney. She underwent cardiac catheterization on July 09, 2010 due to CP, showed patent stents to the LAD and RCA with nonobstructive disease throughout. It was felt the majority of her symptoms were probably due to COPD and she was counseled to stop smoking. She was sent home on Imdur. The patient said she became extremely dizzy after she took one dose and had to stop it. Referred to pulmonary. Saw Dr. Maple Hudson. PFTs with severe COPD.  Carotid u/s January 2013 with RICA 60-79%, LICA 60-79% ). No neuro symptoms.   She is here today for follow up. Down to smoking about 1 cigarette per day. Continues with a chronic cough and dyspnea on exertion related to her smoking. No significant CP. No symptoms of heart failure.    Primary Care Physician: Andi Devon  Pulmonary:  Dr. Maple Hudson  Last Lipid Profile:  Past Medical History  Diagnosis Date  . CAD (coronary artery disease)     Stent RCA 2005, stent LAD 2006  . Hypertension   . Hyperlipemia   . COPD (chronic obstructive pulmonary disease)   . Tobacco abuse   . Fatigue     Past Surgical History  Procedure Date  . Coronary stent placement     drug-eluting; right coronary artery  . Vesicovaginal fistula closure w/ tah   . Coronary angiography Nov 25, 2004    CYPHER stenting, left anterior descending    Current Outpatient Prescriptions  Medication Sig Dispense Refill  . ACCU-CHEK AVIVA PLUS test strip       . albuterol (PROAIR HFA) 108 (90 BASE) MCG/ACT inhaler Inhale 2 puffs into the lungs 4 (four) times daily as needed. Rescue inhaler       . albuterol (PROVENTIL) (2.5 MG/3ML) 0.083% nebulizer solution Take 3 mLs (2.5 mg total) by nebulization every 6 (six) hours as needed for wheezing or shortness of breath.  75  mL  12  . aspirin 81 MG tablet Take 81 mg by mouth daily.        . Blood Glucose Calibration (ACCU-CHEK AVIVA) SOLN       . clonazePAM (KLONOPIN) 0.5 MG tablet Take 0.5 mg by mouth daily.        . Fluticasone-Salmeterol (ADVAIR DISKUS) 250-50 MCG/DOSE AEPB Inhale 1 puff into the lungs every 12 (twelve) hours. RINSE MOUTH WELL       . metFORMIN (GLUCOPHAGE) 500 MG tablet Take 500 mg by mouth daily.        . metoprolol succinate (TOPROL-XL) 25 MG 24 hr tablet Take 25 mg by mouth daily.        . nitroGLYCERIN (NITROSTAT) 0.4 MG SL tablet Place 0.4 mg under the tongue as needed. For chest pain       . PREMARIN vaginal cream       . Red Yeast Rice 600 MG CAPS Take 1 capsule by mouth 2 (two) times daily.      Marland Kitchen tiotropium (SPIRIVA) 18 MCG inhalation capsule as directed.          Allergies  Allergen Reactions  . Codeine     History   Social History  . Marital Status: Married    Spouse Name: N/A    Number of Children: 3  . Years of Education:  N/A   Occupational History  . Retired Journalist, newspaper   Social History Main Topics  . Smoking status: Current Everyday Smoker    Types: Cigarettes  . Smokeless tobacco: Not on file   Comment: started smoking at age 56  . Alcohol Use: Yes     occasional wine  . Drug Use: No  . Sexually Active: Not on file   Other Topics Concern  . Not on file   Social History Narrative   Husband smokes cigars    Family History  Problem Relation Age of Onset  . Diabetes Mother     deceased  . Alzheimer's disease Father     deceased    Review of Systems:  As stated in the HPI and otherwise negative.   BP 140/80  Pulse 100  Ht 5\' 1"  (1.549 m)  Wt 113 lb (51.256 kg)  BMI 21.35 kg/m2  Physical Examination: General: Well developed, well nourished, NAD HEENT: OP clear, mucus membranes moist SKIN: warm, dry. No rashes. Neuro: No focal deficits Musculoskeletal: Muscle strength 5/5 all ext Psychiatric: Mood and affect normal Neck:  No JVD, no carotid bruits, no thyromegaly, no lymphadenopathy. Lungs:Clear bilaterally, no wheezes, rhonci, crackles Cardiovascular: Regular rate and rhythm. No murmurs, gallops or rubs. Abdomen:Soft. Bowel sounds present. Non-tender.  Extremities: No lower extremity edema. Pulses are 2 + in the bilateral DP/PT.

## 2011-11-24 NOTE — Assessment & Plan Note (Signed)
She has moderate bilateral carotid artery disease. Will repeat carotids in 6 months. I will see her then.

## 2011-11-24 NOTE — Telephone Encounter (Signed)
Ordder- DME   Evaluate for light portable O2 system   2 L/M continuous and portable    Dex OSA

## 2011-11-24 NOTE — Patient Instructions (Signed)
Your physician wants you to follow-up in: 6 months. You will receive a reminder letter in the mail two months in advance. If you don't receive a letter, please call our office to schedule the follow-up appointment.  Your physician has recommended you make the following change in your medication: Start Simvastatin 20 mg by mouth daily at bedtime.  Your physician recommends that you return for fasting lab work in: 3 months--3rd week May  Your physician has requested that you have a carotid duplex. This test is an ultrasound of the carotid arteries in your neck. It looks at blood flow through these arteries that supply the brain with blood. Allow one hour for this exam. There are no restrictions or special instructions. To be done in 6 months on day of appt with Dr. Clifton James

## 2011-11-24 NOTE — Assessment & Plan Note (Signed)
Stable. I do not think her left arm pain is cardiac related. This is most likely musculoskeletal in etiology. Continue current meds. She cannot afford Vytorin and has stopped it. Will add back Simvastatin 20 mg po QHS.

## 2011-11-25 NOTE — Telephone Encounter (Signed)
Order placed - pt aware and aware someone will contact her to schedule this.  She verbalized understanding and voiced no further questions/concerns at this time.   Note: OSA is not on pt's problem list.  She was last seen by Dr. Maple Hudson for COPD.

## 2011-12-16 ENCOUNTER — Telehealth: Payer: Self-pay | Admitting: Cardiovascular Disease

## 2011-12-16 NOTE — Telephone Encounter (Signed)
Reviewed with Dr. Eden Emms and since pt is not having any symptoms and states she feels well she does not need office visit today but to schedule appt for next week.  I spoke with pt and she is agreeable with this plan. Appt made for her to see Jacolyn Reedy, PA on December 21, 2011 at 12:30. Pt aware if she has problems prior to appt she should go to ED to be evaluated.

## 2011-12-16 NOTE — Telephone Encounter (Signed)
Please return call to patient at [phone]  Patient experiencing sweating, SOB, Rapid Heartbeat, No dizziness at this point. She would like to be seen yet today. BP left arm 90/60 Right arm 112/60  She can be reached at hm# [phone]

## 2011-12-16 NOTE — Telephone Encounter (Signed)
Spoke with pt who reports she is feeling fine but was seen by Home Health Nurse from [facility] and told she needed to call cardiology for appt to be seen today. Pt states she told home health nurse that she has rapid heart beat and hot flashes at times but that she is having none of these symptoms today. Blood pressure as below.  Will review with Dr. Eden Emms.

## 2011-12-21 ENCOUNTER — Encounter: Payer: Self-pay | Admitting: Physician Assistant

## 2011-12-21 ENCOUNTER — Ambulatory Visit (INDEPENDENT_AMBULATORY_CARE_PROVIDER_SITE_OTHER): Payer: Medicare Other | Admitting: Physician Assistant

## 2011-12-21 VITALS — BP 100/60 | HR 74 | Wt 114.4 lb

## 2011-12-21 DIAGNOSIS — I10 Essential (primary) hypertension: Secondary | ICD-10-CM

## 2011-12-21 DIAGNOSIS — F17200 Nicotine dependence, unspecified, uncomplicated: Secondary | ICD-10-CM

## 2011-12-21 DIAGNOSIS — I2510 Atherosclerotic heart disease of native coronary artery without angina pectoris: Secondary | ICD-10-CM

## 2011-12-21 DIAGNOSIS — R002 Palpitations: Secondary | ICD-10-CM

## 2011-12-21 MED ORDER — METOPROLOL SUCCINATE ER 25 MG PO TB24: ORAL_TABLET | ORAL | Status: DC

## 2011-12-21 NOTE — Assessment & Plan Note (Signed)
stable °

## 2011-12-21 NOTE — Assessment & Plan Note (Signed)
Patient is smoking more cigarettes and she was at her last office visit she is up to 8-10 cigarettes daily. Had a long discussion about smoking cessation with the patient. Patient seems to understand.

## 2011-12-21 NOTE — Patient Instructions (Addendum)
Your physician has recommended you make the following change in your medication: Increase Metoprolol to 25 mg in the morning and 12.5 mg in the evening   Your physician recommends that you schedule a follow-up appointment in: June with Dr. Clifton James

## 2011-12-21 NOTE — Progress Notes (Signed)
HPI:  This is a 71 year old white female patient who comes in today at the request of her home health nurse. She felt like her blood pressure was different in each arm and the patient was having a rapid heartbeat. The patient does not feel like she needs to be here. She says sometimes her heart races in the evening when she lays down but it's also after she takes her albuterol. It eases spontaneously and she goes to sleep. She also complains of hot flashes and feeling flushed after she takes her metformin. She continues to smoke 8-10 cigarettes daily.  Patient has a history of coronary artery disease status post stenting of the RCA in 2005 and LAD in 2006. Cardiac catheter in 2011 due to chest pain showed patent stents to the LAD and RCA with nonobstructive disease throughout. Felt most of her symptoms were due to COPD. The patient also has significant carotid disease with her last ultrasound being in January of this year. She just saw Dr. Clifton James on 11/24/11 and was doing well. She denies chest pain, dizziness, or presyncope.  Allergies  Allergen Reactions  . Codeine     Current Outpatient Prescriptions on File Prior to Visit  Medication Sig Dispense Refill  . ACCU-CHEK AVIVA PLUS test strip       . albuterol (PROAIR HFA) 108 (90 BASE) MCG/ACT inhaler Inhale 2 puffs into the lungs 4 (four) times daily as needed. Rescue inhaler       . albuterol (PROVENTIL) (2.5 MG/3ML) 0.083% nebulizer solution Take 3 mLs (2.5 mg total) by nebulization every 6 (six) hours as needed for wheezing or shortness of breath.  75 mL  12  . aspirin 81 MG tablet Take 81 mg by mouth daily.        . Blood Glucose Calibration (ACCU-CHEK AVIVA) SOLN       . clonazePAM (KLONOPIN) 0.5 MG tablet Take 0.5 mg by mouth daily.        . Fluticasone-Salmeterol (ADVAIR DISKUS) 250-50 MCG/DOSE AEPB Inhale 1 puff into the lungs every 12 (twelve) hours. RINSE MOUTH WELL       . metFORMIN (GLUCOPHAGE) 500 MG tablet Take 500 mg by mouth  daily.        . nitroGLYCERIN (NITROSTAT) 0.4 MG SL tablet Place 0.4 mg under the tongue as needed. For chest pain       . PREMARIN vaginal cream       . Red Yeast Rice 600 MG CAPS Take 1 capsule by mouth 2 (two) times daily.      . simvastatin (ZOCOR) 20 MG tablet Take 1 tablet (20 mg total) by mouth every evening.  30 tablet  11  . tiotropium (SPIRIVA) 18 MCG inhalation capsule as directed.          Past Medical History  Diagnosis Date  . CAD (coronary artery disease)     Stent RCA 2005, stent LAD 2006  . Hypertension   . Hyperlipemia   . COPD (chronic obstructive pulmonary disease)   . Tobacco abuse   . Fatigue     Past Surgical History  Procedure Date  . Coronary stent placement     drug-eluting; right coronary artery  . Vesicovaginal fistula closure w/ tah   . Coronary angiography Nov 25, 2004    CYPHER stenting, left anterior descending    Family History  Problem Relation Age of Onset  . Diabetes Mother     deceased  . Alzheimer's disease Father     deceased  History   Social History  . Marital Status: Married    Spouse Name: N/A    Number of Children: 3  . Years of Education: N/A   Occupational History  . Retired Journalist, newspaper   Social History Main Topics  . Smoking status: Current Everyday Smoker    Types: Cigarettes  . Smokeless tobacco: Not on file   Comment: started smoking at age 89  . Alcohol Use: Yes     occasional wine  . Drug Use: No  . Sexually Active: Not on file   Other Topics Concern  . Not on file   Social History Narrative   Husband smokes cigars    ROS: See history of present illness otherwise normal  PHYSICAL EXAM: Well-nournished, in no acute distress, on oxygen. Neck: No JVD, HJR, Bruit, or thyroid enlargement Lungs: decreased breath sounds throughout,No tachypnea, clear without wheezing, rales, or rhonchi Cardiovascular: RRR, PMI not displaced, heart sounds normal, no murmurs, gallops, bruit, thrill, or  heave. Abdomen: BS normal. Soft without organomegaly, masses, lesions or tenderness. Extremities: without cyanosis, clubbing or edema. Good distal pulses bilateral SKin: Warm, no lesions or rashes  Musculoskeletal: No deformities Neuro: no focal signs  BP 114/60  Pulse 74  Wt 114 lb 6.4 oz (51.891 kg)   WUJ:WJXBJY sinus rhythm at 74 beats per minute

## 2011-12-21 NOTE — Assessment & Plan Note (Signed)
Blood pressure is stable today. I took her blood pressure both arms and it was 100/60 bilaterally.

## 2011-12-21 NOTE — Assessment & Plan Note (Signed)
Patient complains of palpitations mostly at night after she takes her albuterol inhaler. She also smokes 8-10 cigarettes daily. She is on home oxygen. I've asked her to we will increase her metoprolol to 25 mg in the morning 12-1/2 mg in the evening. She is to let us know if she has any problems with increased wheezing at this dose.

## 2012-01-23 ENCOUNTER — Telehealth: Payer: Self-pay | Admitting: Cardiovascular Disease

## 2012-01-23 NOTE — Telephone Encounter (Signed)
C/o of generalized weakness and c/o joint/muscle ache from zocor, loud deep cough note during phone conversation, Her pcp Dr Renae Gloss is going to order her spariva for copd and will change her zocor back to vytorin 10/40. Please advise if pt needs to be seen sooner for generalized weakness/ fears it may be from blockage.  She wants to know when she can be scheduled for ov and carotid on the same day, informed her I will forward to Dr Mcalhany/rn out of office today and will be called back tomorrow and advised, pt agreed to plan. Call back 4/23 please

## 2012-01-23 NOTE — Telephone Encounter (Signed)
Pt having weakness and wants to know if she should get her carotid and appt with mcalhany earlier?

## 2012-01-24 NOTE — Telephone Encounter (Signed)
[name], see phone note from yesterday. Can you call and check on her. I will be glad to see her soon if she is feeling poorly. Maybe we can arrange her f/u visit and carotids on same day. Thanks, chris

## 2012-01-24 NOTE — Telephone Encounter (Signed)
Spoke with pt and she states she is feeling better today. She would like to schedule appt with Dr. Clifton James and carotid doppler appt earlier than previously planned in June and August. First available day to have doppler and see Dr. Clifton James same day is May 14 at 10:00. I offered pt appt times on separate days but she would like doppler and MD appt same day.  Appts made for May 14.

## 2012-01-30 ENCOUNTER — Ambulatory Visit (INDEPENDENT_AMBULATORY_CARE_PROVIDER_SITE_OTHER): Payer: Medicare Other | Admitting: Internal Medicine

## 2012-01-30 ENCOUNTER — Encounter: Payer: Self-pay | Admitting: Internal Medicine

## 2012-01-30 ENCOUNTER — Ambulatory Visit (INDEPENDENT_AMBULATORY_CARE_PROVIDER_SITE_OTHER)
Admission: RE | Admit: 2012-01-30 | Discharge: 2012-01-30 | Disposition: A | Discharge status: Home / Self Care | Payer: Medicare Other | Source: Ambulatory Visit | Attending: Internal Medicine | Admitting: Internal Medicine

## 2012-01-30 VITALS — BP 102/62 | HR 78 | Ht 61.0 in | Wt 117.0 lb

## 2012-01-30 DIAGNOSIS — F17200 Nicotine dependence, unspecified, uncomplicated: Secondary | ICD-10-CM

## 2012-01-30 DIAGNOSIS — J441 Chronic obstructive pulmonary disease with (acute) exacerbation: Secondary | ICD-10-CM

## 2012-01-30 DIAGNOSIS — J449 Chronic obstructive pulmonary disease, unspecified: Secondary | ICD-10-CM

## 2012-01-30 DIAGNOSIS — J4489 Other specified chronic obstructive pulmonary disease: Secondary | ICD-10-CM

## 2012-01-30 MED ORDER — AMOXICILLIN 500 MG PO CAPS: 500.0000 mg | ORAL_CAPSULE | Freq: Three times a day (TID) | ORAL | Status: AC

## 2012-01-30 MED ORDER — PREDNISONE 5 MG PO TABS: ORAL_TABLET | ORAL | Status: DC

## 2012-01-30 NOTE — Progress Notes (Signed)
Patient ID: [name], female    DOB: [birth year], 71 y.o.   MRN: [id number]  HPI 03/21/11- 80 yoF followed for COPD, ongoing tobacco use, complicated by CAD Last here Nov 18, 2010- note reviewed.  Continues in a Spiriva drug trial CXR 01/25/11 - stable COPD, NAD She continues to find that sleeping with oxygen makes her feel much better. Continues Advair and Spiriva regularly. Rare need for rescue inhaler.  07/08/11 62 yoF followed for COPD, ongoing tobacco use, complicated by CAD, DM   Husband and daughter here Acute visit- Dyspnea   She complains of feeling somehow bad with more short of breath particularly in the last 2-3 weeks. Her husband says she has been steadily declining, weaker, for 4 or 5 months. She complains of pain in her left bicep if she tries to reach around to her back but this is not exertional pain otherwise and she denies angina-type pain or pleuritic pain. Mainly she is complaining of shortness of breath. There is some increased cough with scant clear mucus. She gets a burning sensation in the upper sternum just in the last few days. Some nausea last week. She was seen at her primary care office 4 days ago , given a Rocephin injection and started on Levaquin. Chest x-ray was done and she was told it was clear. She denies swollen glands, rash, nausea or vomiting, change in bowel or bladder , swelling or aching in legs. EKG- RSR, RAE, NAD  07/15/11-69 yoF followed for COPD, ongoing tobacco use, complicated by CAD, DM..........Marland Kitchenhusband here Much stronger and feeling much better than last time. Has reduced but not quit smoking-discussed. Notes desat walking through home on room air to as low as 83%. Discussed use of O2 for goal sat >/= 88% and discussed CO2 retention. Coughs up a little thick clear mucus.   10/31/11- 82 yoF followed for COPD, ongoing tobacco use, complicated by CAD, DM..........Marland Kitchenhusband here In past week just gotten over the breathing concerns since seen last  time and in December (when had flu like symptoms); Has been using O2 more-having low O2 levels when not on O2.(low as 75%). Unfortunately she has continued to smoke a few cigarettes daily against advice. She has been dealing with a sustained exacerbation of COPD really since October. Using oxygen continuously between 2 and 3 L per minute/Advanced. We talked about pulmonary rehabilitation. She never took the invitation to try the [facility] program. She said then that [facility] but she didn't do that either. She paces herself at home wearing oxygen, to do limited housework. She can move a vacuum cleaner but not move furniture. Bothersome pain in the left shoulder with decreased range of motion. Little cough, phlegm, exertional chest pain, edema. No blood. PFT-09/06/2010-reviewed again with her and her husband. Very severe obstructive airways disease, emphysema pattern, FEV1/FVC 0.28, DLCO 42%. She has a home nebulizer machine with albuterol and that works better for her than metered-dose inhalers.  01/30/12- 78 yoF followed for COPD, ongoing tobacco use, complicated by CAD, DM..........Marland Kitchenhusband here  Pt states breathing is worsening, increased sob. Pt is wearing O2 24/7 at 2-3LPM, pt states she is trying to quit smoking and is currently using the electronic cigarrette rather than the tobacco. She is worrking toward quitting. Pt states mucus is increased, green-yellow phlegm Now with 2 days of green sputum and watery rhinorrhea without sore throat or fever. She restarted Spiriva   CXR 07/04/11- IMPRESSION:  COPD. No  active lung disease.  Original Report Authenticated By: Juline Patch, M.D.   Review of Systems Constitutional:   No-   weight loss, night sweats, fevers, chills,+ fatigue, lassitude. HEENT:   No-  headaches, difficulty swallowing, tooth/dental problems, sore throat,       No-  sneezing, itching, ear ache, nasal congestion, post nasal drip,  CV:  No-   chest pain,  orthopnea, PND, swelling in lower extremities, dizziness, palpitations Resp: +  shortness of breath with exertion or at rest.              +  productive cough,  No non-productive cough,  No-  coughing up of blood.              No-   change in color of mucus.  No- wheezing.   Skin: No-   rash or lesions. GI:  No-   heartburn, indigestion, abdominal pain, nausea, vomiting,  GU:  MS:  No-   joint pain or swelling.   Neuro- nothing unusual Psych:  No- change in mood or affect. No depression or anxiety.  No memory loss.   OBJ General- Alert, Oriented, Much stronger.  O2 2L sat 90% Skin- rash-none, lesions- none, excoriation- none Lymphadenopathy- none Head- atraumatic            Eyes- Gross vision intact, PERRLA, conjunctivae clear secretions            Ears- Hearing, canals- normal            Nose- Clear, no-Septal dev, mucus, polyps, erosion, perforation             Throat- Mallampati II , mucosa clear , drainage- none, tonsils- atrophic Neck- flexible , trachea midline, no stridor , thyroid nl, carotid no bruit Chest - symmetrical excursion , unlabored           Heart/CV- RRR , no murmur , no gallop  , no rub, nl s1 s2                           - JVD- none , edema- none, stasis changes- none, varices- none           Lung- very distant,  + loose upper airway rattle, wheeze-trace, cough- none , dullness-none, rub- none           Chest wall-  Abd-  Br/ Gen/ Rectal- Not done, not indicated Extrem- cyanosis- none, clubbing, none, atrophy- none, strength- nl Neuro- grossly intact to observation

## 2012-01-30 NOTE — Patient Instructions (Signed)
Sample Tudorza inhaler- 1 puff twice daily   Try this instead of Spiriva to see if you like it better.   Script sent for amoxacillin  Script sent for prednisone  Order- CXR  Dx COPD exacerbation

## 2012-01-31 NOTE — Progress Notes (Signed)
Quick Note:  Pt aware of results. ______ 

## 2012-02-02 ENCOUNTER — Encounter: Payer: Self-pay | Admitting: Internal Medicine

## 2012-02-02 NOTE — Assessment & Plan Note (Signed)
Smoking cessation information and support has been offered repeatedly. She is now motivated to by relaxation that she has run out of lung reserve and is in trouble. I would rather have her use an electronic cigarette smoke tobacco but rather have her stop all of it completely and will work towards that goal.

## 2012-02-02 NOTE — Assessment & Plan Note (Signed)
Acute exacerbation of COPD. Plan-chest x-ray, amoxicillin, prednisone. Try [employer] instead of Spiriva for comparison.

## 2012-02-14 ENCOUNTER — Encounter (INDEPENDENT_AMBULATORY_CARE_PROVIDER_SITE_OTHER): Payer: Medicare Other

## 2012-02-14 ENCOUNTER — Ambulatory Visit (INDEPENDENT_AMBULATORY_CARE_PROVIDER_SITE_OTHER): Payer: Medicare Other | Admitting: Cardiovascular Disease

## 2012-02-14 ENCOUNTER — Encounter: Payer: Self-pay | Admitting: Cardiovascular Disease

## 2012-02-14 VITALS — BP 136/72 | HR 92 | Ht 61.0 in | Wt 114.0 lb

## 2012-02-14 DIAGNOSIS — F17200 Nicotine dependence, unspecified, uncomplicated: Secondary | ICD-10-CM

## 2012-02-14 DIAGNOSIS — I2510 Atherosclerotic heart disease of native coronary artery without angina pectoris: Secondary | ICD-10-CM

## 2012-02-14 DIAGNOSIS — I6529 Occlusion and stenosis of unspecified carotid artery: Secondary | ICD-10-CM

## 2012-02-14 DIAGNOSIS — R002 Palpitations: Secondary | ICD-10-CM

## 2012-02-14 MED ORDER — METOPROLOL SUCCINATE ER 25 MG PO TB24
25.0000 mg | ORAL_TABLET | Freq: Every day | ORAL | Status: DC
Start: 2012-02-14 — End: 2012-02-16

## 2012-02-14 NOTE — Progress Notes (Signed)
History of Present Illness: 71 yo WF with prior history of HTN, HLD, COPD and CAD status post stenting of right coronary artery in 2005 and LAD in 2006 here for cardiac follow up. She has been followed in the past by Dr. Gala Romney. She underwent cardiac catheterization on July 09, 2010 due to CP, showed patent stents to the LAD and RCA with nonobstructive disease throughout. It was felt the majority of her symptoms were probably due to COPD and she was counseled to stop smoking. She was sent home on Imdur. The patient said she became extremely dizzy after she took one dose and had to stop it. Referred to pulmonary. Saw Dr. Maple Hudson. PFTs with severe COPD. Carotid u/s January 2013 with RICA 60-79%, LICA 60-79% ). No neuro symptoms. I met her for the first time in February 2013. Since then she has   She is here today for follow up. Down to smoking about 1 cigarette per day. Continues with a chronic cough and dyspnea on exertion related to her smoking. No significant CP. No symptoms of heart failure. No change in weight. No palpitations.    Primary Care Physician: Andi Devon  Last Lipid Profile:  Lipid Panel    Past Medical History  Diagnosis Date  . CAD (coronary artery disease)     Stent RCA 2005, stent LAD 2006  . Hypertension   . Hyperlipemia   . COPD (chronic obstructive pulmonary disease)   . Tobacco abuse   . Fatigue     Past Surgical History  Procedure Date  . Coronary stent placement     drug-eluting; right coronary artery  . Vesicovaginal fistula closure w/ tah   . Coronary angiography Nov 25, 2004    CYPHER stenting, left anterior descending    Current Outpatient Prescriptions  Medication Sig Dispense Refill  . ACCU-CHEK AVIVA PLUS test strip       . albuterol (PROAIR HFA) 108 (90 BASE) MCG/ACT inhaler Inhale 2 puffs into the lungs 4 (four) times daily as needed. Rescue inhaler       . albuterol (PROVENTIL) (2.5 MG/3ML) 0.083% nebulizer solution Take 3 mLs (2.5 mg  total) by nebulization every 6 (six) hours as needed for wheezing or shortness of breath.  75 mL  12  . Ascorbic Acid (VITAMIN C PO) Take by mouth daily.      Marland Kitchen aspirin 81 MG tablet Take 81 mg by mouth daily.        . Blood Glucose Calibration (ACCU-CHEK AVIVA) SOLN       . CINNAMON PO Take by mouth. 2000mg  daily      . clonazePAM (KLONOPIN) 0.5 MG tablet Take 0.5 mg by mouth daily.        Marland Kitchen ezetimibe-simvastatin (VYTORIN) 10-40 MG per tablet Take 1 tablet by mouth at bedtime.      . Fluticasone-Salmeterol (ADVAIR DISKUS) 250-50 MCG/DOSE AEPB Inhale 1 puff into the lungs every 12 (twelve) hours. RINSE MOUTH WELL       . metFORMIN (GLUCOPHAGE) 500 MG tablet Take 500 mg by mouth daily.        . metoprolol succinate (TOPROL-XL) 25 MG 24 hr tablet Take 1 tablet in the morning and 1/2 tablet in the evening  45 tablet  3  . Multiple Vitamin (MULTIVITAMIN) tablet Take 1 tablet by mouth daily.      . nitroGLYCERIN (NITROSTAT) 0.4 MG SL tablet Place 0.4 mg under the tongue as needed. For chest pain       .  Omega-3 Fatty Acids (FISH OIL) 1200 MG CAPS Take 1 capsule by mouth daily.      . predniSONE (DELTASONE) 5 MG tablet 1 daily  10 tablet  0  . PREMARIN vaginal cream as needed.       . Red Yeast Rice 600 MG CAPS Take 1 capsule by mouth 2 (two) times daily.      Marland Kitchen tiotropium (SPIRIVA) 18 MCG inhalation capsule Place 18 mcg into inhaler and inhale daily. If needed        Allergies  Allergen Reactions  . Codeine     History   Social History  . Marital Status: Married    Spouse Name: N/A    Number of Children: 3  . Years of Education: N/A   Occupational History  . Retired Journalist, newspaper   Social History Main Topics  . Smoking status: Current Everyday Smoker    Types: Cigarettes  . Smokeless tobacco: Not on file   Comment: started smoking at age 59/// Pt is currently trying to quit smoking, using the electronic cigs, not longer smoking tobacco  . Alcohol Use: Yes      occasional wine  . Drug Use: No  . Sexually Active: Not on file   Other Topics Concern  . Not on file   Social History Narrative   Husband smokes cigars    Family History  Problem Relation Age of Onset  . Diabetes Mother     deceased  . Alzheimer's disease Father     deceased    Review of Systems:  As stated in the HPI and otherwise negative.   BP 136/72  Pulse 92  Ht 5\' 1"  (1.549 m)  Wt 114 lb (51.71 kg)  BMI 21.54 kg/m2  Physical Examination: General: Well developed, well nourished, NAD HEENT: OP clear, mucus membranes moist SKIN: warm, dry. No rashes. Neuro: No focal deficits Musculoskeletal: Muscle strength 5/5 all ext Psychiatric: Mood and affect normal Neck: No JVD, no carotid bruits, no thyromegaly, no lymphadenopathy. Lungs:Decreased breath sounds bilaterally, no wheezes, rhonci, crackles Cardiovascular: Regular rate and rhythm. No murmurs, gallops or rubs. Abdomen:Soft. Bowel sounds present. Non-tender.  Extremities: No lower extremity edema. Pulses are 2 + in the bilateral DP/PT.

## 2012-02-14 NOTE — Assessment & Plan Note (Signed)
Smoking cessation advised.

## 2012-02-14 NOTE — Assessment & Plan Note (Signed)
Stable by dopplers today.

## 2012-02-14 NOTE — Progress Notes (Signed)
Addended by: Dossie Arbour on: 02/14/2012 11:08 AM   Modules accepted: Orders

## 2012-02-14 NOTE — Assessment & Plan Note (Signed)
No recurrence. Continue beta blocker.

## 2012-02-14 NOTE — Assessment & Plan Note (Signed)
Stable. Continue current meds.   

## 2012-02-14 NOTE — Patient Instructions (Signed)
Your physician wants you to follow-up in: 6 months.  You will receive a reminder letter in the mail two months in advance. If you don't receive a letter, please call our office to schedule the follow-up appointment.  Your physician has recommended you make the following change in your medication: Decrease Toprol to 25 mg by mouth daily

## 2012-02-16 ENCOUNTER — Inpatient Hospital Stay (HOSPITAL_COMMUNITY)
Admission: EM | Admit: 2012-02-16 | Discharge: 2012-02-18 | DRG: 189 | Disposition: A | Discharge status: Home / Self Care | Payer: Medicare Other | Attending: Internal Medicine | Admitting: Internal Medicine

## 2012-02-16 ENCOUNTER — Emergency Department (HOSPITAL_COMMUNITY): Payer: Medicare Other

## 2012-02-16 ENCOUNTER — Encounter (HOSPITAL_COMMUNITY): Payer: Self-pay | Admitting: *Deleted

## 2012-02-16 DIAGNOSIS — E872 Acidosis, unspecified: Secondary | ICD-10-CM | POA: Diagnosis present

## 2012-02-16 DIAGNOSIS — F17201 Nicotine dependence, unspecified, in remission: Secondary | ICD-10-CM | POA: Diagnosis present

## 2012-02-16 DIAGNOSIS — J441 Chronic obstructive pulmonary disease with (acute) exacerbation: Secondary | ICD-10-CM | POA: Diagnosis present

## 2012-02-16 DIAGNOSIS — Z9861 Coronary angioplasty status: Secondary | ICD-10-CM

## 2012-02-16 DIAGNOSIS — F411 Generalized anxiety disorder: Secondary | ICD-10-CM | POA: Diagnosis present

## 2012-02-16 DIAGNOSIS — J9622 Acute and chronic respiratory failure with hypercapnia: Secondary | ICD-10-CM | POA: Diagnosis present

## 2012-02-16 DIAGNOSIS — Z79899 Other long term (current) drug therapy: Secondary | ICD-10-CM

## 2012-02-16 DIAGNOSIS — E785 Hyperlipidemia, unspecified: Secondary | ICD-10-CM | POA: Diagnosis present

## 2012-02-16 DIAGNOSIS — I252 Old myocardial infarction: Secondary | ICD-10-CM

## 2012-02-16 DIAGNOSIS — I472 Ventricular tachycardia, unspecified: Secondary | ICD-10-CM | POA: Diagnosis not present

## 2012-02-16 DIAGNOSIS — Z7982 Long term (current) use of aspirin: Secondary | ICD-10-CM

## 2012-02-16 DIAGNOSIS — I10 Essential (primary) hypertension: Secondary | ICD-10-CM | POA: Diagnosis present

## 2012-02-16 DIAGNOSIS — I2510 Atherosclerotic heart disease of native coronary artery without angina pectoris: Secondary | ICD-10-CM | POA: Diagnosis present

## 2012-02-16 DIAGNOSIS — J438 Other emphysema: Secondary | ICD-10-CM

## 2012-02-16 DIAGNOSIS — J449 Chronic obstructive pulmonary disease, unspecified: Secondary | ICD-10-CM

## 2012-02-16 DIAGNOSIS — J9620 Acute and chronic respiratory failure, unspecified whether with hypoxia or hypercapnia: Principal | ICD-10-CM

## 2012-02-16 DIAGNOSIS — I4729 Other ventricular tachycardia: Secondary | ICD-10-CM | POA: Diagnosis not present

## 2012-02-16 DIAGNOSIS — F17200 Nicotine dependence, unspecified, uncomplicated: Secondary | ICD-10-CM | POA: Diagnosis present

## 2012-02-16 DIAGNOSIS — J9600 Acute respiratory failure, unspecified whether with hypoxia or hypercapnia: Secondary | ICD-10-CM

## 2012-02-16 HISTORY — DX: Major depressive disorder, single episode, unspecified: F32.9

## 2012-02-16 HISTORY — DX: Acute myocardial infarction, unspecified: I21.9

## 2012-02-16 HISTORY — DX: Depression, unspecified: F32.A

## 2012-02-16 HISTORY — DX: Cardiac murmur, unspecified: R01.1

## 2012-02-16 HISTORY — DX: Acute upper respiratory infection, unspecified: J06.9

## 2012-02-16 HISTORY — DX: Shortness of breath: R06.02

## 2012-02-16 LAB — BASIC METABOLIC PANEL
Calcium: 9.1 mg/dL (ref 8.4–10.5)
GFR calc non Af Amer: >90 mL/min (ref >90)
Glucose, Bld: 155 mg/dL — ABNORMAL HIGH (ref 70–99)
Sodium: 144 mEq/L (ref 135–145)

## 2012-02-16 LAB — DIFFERENTIAL
Basophils Absolute: 0 10*3/uL (ref 0.0–0.1)
Basophils Relative: 0 % (ref 0–1)
Eosinophils Absolute: 0.1 10*3/uL (ref 0.0–0.7)
Eosinophils Relative: 1 % (ref 0–5)
Lymphs Abs: 1.6 10*3/uL (ref 0.7–4.0)

## 2012-02-16 LAB — CBC
HCT: 37.5 % (ref 36.0–46.0)
MCH: 33.8 pg (ref 26.0–34.0)
MCHC: 31.6 g/dL (ref 30.0–36.0)
MCV: 107 fL — ABNORMAL HIGH (ref 78.0–100.0)
Platelets: 150 10*3/uL (ref 150–400)
Platelets: 153 10*3/uL (ref 150–400)
RBC: 3.58 MIL/uL — ABNORMAL LOW (ref 3.87–5.11)
RDW: 12.6 % (ref 11.5–15.5)
RDW: 12.5 % (ref 11.5–15.5)
WBC: 9.5 10*3/uL (ref 4.0–10.5)

## 2012-02-16 LAB — CARDIAC PANEL(CRET KIN+CKTOT+MB+TROPI)
Relative Index: INVALID (ref 0.0–2.5)
Troponin I: <0.3 ng/mL (ref <0.30)

## 2012-02-16 LAB — POCT I-STAT 3, ART BLOOD GAS (G3+)
Acid-Base Excess: 13 mmol/L — ABNORMAL HIGH (ref 0.0–2.0)
Bicarbonate: 43.1 mEq/L — ABNORMAL HIGH (ref 20.0–24.0)
O2 Saturation: 91 %
pO2, Arterial: 71 mmHg — ABNORMAL LOW (ref 80.0–100.0)

## 2012-02-16 LAB — CREATININE, SERUM
GFR calc Af Amer: >90 mL/min (ref >90)
GFR calc non Af Amer: >90 mL/min (ref >90)

## 2012-02-16 LAB — POCT I-STAT TROPONIN I

## 2012-02-16 MED ORDER — ONDANSETRON HCL 4 MG/2ML IJ SOLN: 4.0000 mg | Freq: Four times a day (QID) | INTRAMUSCULAR | Status: DC | PRN

## 2012-02-16 MED ORDER — ALBUTEROL SULFATE (5 MG/ML) 0.5% IN NEBU
2.5000 mg | INHALATION_SOLUTION | Freq: Once | RESPIRATORY_TRACT | Status: AC
  Administered 2012-02-16: 2.5 mg via RESPIRATORY_TRACT

## 2012-02-16 MED ORDER — ALBUTEROL SULFATE (5 MG/ML) 0.5% IN NEBU
5.0000 mg | INHALATION_SOLUTION | RESPIRATORY_TRACT | Status: AC
  Administered 2012-02-16: 5 mg via RESPIRATORY_TRACT

## 2012-02-16 MED ORDER — ENOXAPARIN SODIUM 40 MG/0.4ML ~~LOC~~ SOLN
40.0000 mg | SUBCUTANEOUS | Status: DC
  Administered 2012-02-16 – 2012-02-17 (×2): 40 mg via SUBCUTANEOUS
  Filled 2012-02-16 (×3): qty 0.4

## 2012-02-16 MED ORDER — EZETIMIBE-SIMVASTATIN 10-40 MG PO TABS
1.0000 | ORAL_TABLET | Freq: Every day | ORAL | Status: DC
  Administered 2012-02-16 – 2012-02-17 (×2): 1 via ORAL
  Filled 2012-02-16 (×4): qty 1

## 2012-02-16 MED ORDER — ASPIRIN EC 81 MG PO TBEC
81.0000 mg | DELAYED_RELEASE_TABLET | Freq: Every day | ORAL | Status: DC
  Administered 2012-02-17 – 2012-02-18 (×2): 81 mg via ORAL
  Filled 2012-02-16 (×3): qty 1

## 2012-02-16 MED ORDER — ALBUTEROL SULFATE (5 MG/ML) 0.5% IN NEBU
2.5000 mg | INHALATION_SOLUTION | Freq: Once | RESPIRATORY_TRACT | Status: AC
  Administered 2012-02-16: 2.5 mg via RESPIRATORY_TRACT
  Filled 2012-02-16: qty 0.5

## 2012-02-16 MED ORDER — FLUTICASONE-SALMETEROL 250-50 MCG/DOSE IN AEPB
1.0000 | INHALATION_SPRAY | Freq: Two times a day (BID) | RESPIRATORY_TRACT | Status: DC
Start: 2012-02-16 — End: 2012-02-18
  Administered 2012-02-17 – 2012-02-18 (×3): 1 via RESPIRATORY_TRACT
  Filled 2012-02-16: qty 14

## 2012-02-16 MED ORDER — METHYLPREDNISOLONE SODIUM SUCC 125 MG IJ SOLR
60.0000 mg | Freq: Three times a day (TID) | INTRAMUSCULAR | Status: DC
  Administered 2012-02-16 – 2012-02-17 (×3): 60 mg via INTRAVENOUS
  Filled 2012-02-16: qty 2
  Filled 2012-02-16: qty 0.96
  Filled 2012-02-16: qty 2
  Filled 2012-02-16: qty 0.96
  Filled 2012-02-16: qty 2
  Filled 2012-02-16: qty 0.96

## 2012-02-16 MED ORDER — ALBUTEROL SULFATE (5 MG/ML) 0.5% IN NEBU
2.5000 mg | INHALATION_SOLUTION | RESPIRATORY_TRACT | Status: DC | PRN
  Filled 2012-02-16: qty 0.5

## 2012-02-16 MED ORDER — DEXTROSE 5 % IV SOLN
500.0000 mg | Freq: Once | INTRAVENOUS | Status: AC
  Administered 2012-02-16: 500 mg via INTRAVENOUS
  Filled 2012-02-16: qty 500

## 2012-02-16 MED ORDER — HYDROCODONE-ACETAMINOPHEN 5-325 MG PO TABS: 1.0000 | ORAL_TABLET | ORAL | Status: DC | PRN

## 2012-02-16 MED ORDER — ACETAMINOPHEN 325 MG PO TABS: 650.0000 mg | ORAL_TABLET | Freq: Four times a day (QID) | ORAL | Status: DC | PRN

## 2012-02-16 MED ORDER — SODIUM CHLORIDE 0.9 % IV SOLN: 250.0000 mL | INTRAVENOUS | Status: DC | PRN

## 2012-02-16 MED ORDER — IPRATROPIUM BROMIDE 0.02 % IN SOLN
0.5000 mg | Freq: Four times a day (QID) | RESPIRATORY_TRACT | Status: DC
  Administered 2012-02-17 – 2012-02-18 (×5): 0.5 mg via RESPIRATORY_TRACT
  Filled 2012-02-16 (×6): qty 2.5

## 2012-02-16 MED ORDER — SODIUM CHLORIDE 0.9 % IJ SOLN: 3.0000 mL | INTRAMUSCULAR | Status: DC | PRN

## 2012-02-16 MED ORDER — NITROGLYCERIN 0.4 MG SL SUBL: 0.4000 mg | SUBLINGUAL_TABLET | SUBLINGUAL | Status: DC | PRN

## 2012-02-16 MED ORDER — MOXIFLOXACIN HCL 400 MG PO TABS
400.0000 mg | ORAL_TABLET | Freq: Every day | ORAL | Status: DC
  Administered 2012-02-16 – 2012-02-17 (×2): 400 mg via ORAL
  Filled 2012-02-16 (×3): qty 1

## 2012-02-16 MED ORDER — METHYLPREDNISOLONE SODIUM SUCC 125 MG IJ SOLR
125.0000 mg | Freq: Once | INTRAMUSCULAR | Status: AC
  Administered 2012-02-16: 125 mg via INTRAVENOUS
  Filled 2012-02-16: qty 2

## 2012-02-16 MED ORDER — CLONAZEPAM 0.5 MG PO TABS
1.0000 mg | ORAL_TABLET | Freq: Two times a day (BID) | ORAL | Status: DC | PRN
Start: 2012-02-16 — End: 2012-02-18
  Administered 2012-02-17: 1 mg via ORAL
  Filled 2012-02-16: qty 2

## 2012-02-16 MED ORDER — ONDANSETRON HCL 4 MG PO TABS: 4.0000 mg | ORAL_TABLET | Freq: Four times a day (QID) | ORAL | Status: DC | PRN

## 2012-02-16 MED ORDER — ALBUTEROL SULFATE (5 MG/ML) 0.5% IN NEBU
2.5000 mg | INHALATION_SOLUTION | Freq: Four times a day (QID) | RESPIRATORY_TRACT | Status: DC
  Administered 2012-02-16 (×2): 2.5 mg via RESPIRATORY_TRACT
  Filled 2012-02-16: qty 0.5

## 2012-02-16 MED ORDER — GUAIFENESIN ER 600 MG PO TB12
1200.0000 mg | ORAL_TABLET | Freq: Two times a day (BID) | ORAL | Status: DC
  Administered 2012-02-16 – 2012-02-18 (×4): 1200 mg via ORAL
  Filled 2012-02-16 (×5): qty 2

## 2012-02-16 MED ORDER — ASPIRIN 325 MG PO TABS
325.0000 mg | ORAL_TABLET | ORAL | Status: AC
  Administered 2012-02-16: 325 mg via ORAL
  Filled 2012-02-16: qty 1

## 2012-02-16 MED ORDER — ACETAMINOPHEN 650 MG RE SUPP: 650.0000 mg | Freq: Four times a day (QID) | RECTAL | Status: DC | PRN

## 2012-02-16 MED ORDER — ALBUTEROL SULFATE (5 MG/ML) 0.5% IN NEBU
2.5000 mg | INHALATION_SOLUTION | Freq: Four times a day (QID) | RESPIRATORY_TRACT | Status: DC
  Administered 2012-02-17 – 2012-02-18 (×5): 2.5 mg via RESPIRATORY_TRACT
  Filled 2012-02-16 (×6): qty 0.5

## 2012-02-16 MED ORDER — GUAIFENESIN ER 600 MG PO TB12
600.0000 mg | ORAL_TABLET | Freq: Two times a day (BID) | ORAL | Status: DC
  Filled 2012-02-16: qty 1

## 2012-02-16 MED ORDER — IPRATROPIUM BROMIDE 0.02 % IN SOLN
0.5000 mg | Freq: Four times a day (QID) | RESPIRATORY_TRACT | Status: DC
  Administered 2012-02-16 (×2): 0.5 mg via RESPIRATORY_TRACT
  Filled 2012-02-16 (×2): qty 2.5

## 2012-02-16 MED ORDER — SODIUM CHLORIDE 0.9 % IJ SOLN
3.0000 mL | Freq: Two times a day (BID) | INTRAMUSCULAR | Status: DC
  Administered 2012-02-16 – 2012-02-18 (×4): 3 mL via INTRAVENOUS

## 2012-02-16 MED ORDER — MORPHINE SULFATE 2 MG/ML IJ SOLN: 1.0000 mg | INTRAMUSCULAR | Status: DC | PRN

## 2012-02-16 NOTE — ED Notes (Signed)
RT called for breathing treatment.

## 2012-02-16 NOTE — ED Notes (Signed)
Called to give report.  RN unable to accept report at this time.

## 2012-02-16 NOTE — H&P (Signed)
[facility] History and Physical  [name] ZOX:[id number] DOB: [birth year] DOA: 02/16/2012   PCP: Alva Garnet., MD, MD   Chief Complaint: SOB, weakness of legs.   HPI:  71 year old with past medical history significant for hypertension, coronary artery disease status post stenting of the right coronary artery in 2005 and LAD in 2006, severe COPD FEV1/FVC 0.28, DLCO 42% (2011), current smoker who presents to the emergency department complaining of worsening shortness of breath for the last 2 days prior to admission. This morning she was feeling very tired and weak. She notice  weakness on her bilateral lower extremities. She's been having worsening shortness of breath. She has been using her nebulizer treatment but is not helping. She is going to  try to quit smoking. She does relate increased cough. Denies fever, chest pain, lower extremity edema. She has been noticed to be more sleepy recently per family.     Review of Systems:  Constitutional:  No weight loss, night sweats, Fevers, chills.  HEENT:  No headaches, Difficulty swallowing,Tooth/dental problems,Sore throat,  No sneezing, itching, ear ache, nasal congestion, post nasal drip,  Cardio-vascular:  No chest pain, Orthopnea, PND, swelling in lower extremities, anasarca, dizziness, palpitations  GI:  No heartburn, indigestion, abdominal pain, nausea, vomiting, diarrhea, change in bowel habits, loss of appetite    Skin:  no rash or lesions.  GU:  no dysuria, change in color of urine, no urgency or frequency. No flank pain.  Musculoskeletal:  No joint pain or swelling. No decreased range of motion. No back pain.  Psych:  No change in mood or affect. No depression or anxiety.    Past Medical History  Diagnosis Date  . CAD (coronary artery disease)     Stent RCA 2005, stent LAD 2006  . Hypertension   . Hyperlipemia   . COPD (chronic obstructive pulmonary disease)   . Tobacco abuse   . Fatigue      Past Surgical History  Procedure Date  . Coronary stent placement     drug-eluting; right coronary artery  . Vesicovaginal fistula closure w/ tah   . Coronary angiography Nov 25, 2004    CYPHER stenting, left anterior descending   Social History:  reports that she has been smoking Cigarettes.  She does not have any smokeless tobacco history on file. She reports that she drinks alcohol. She reports that she does not use illicit drugs.  Allergies  Allergen Reactions  . Codeine     Family History  Problem Relation Age of Onset  . Diabetes Mother     deceased  . Alzheimer's disease Father     deceased    Prior to Admission medications   Medication Sig Start Date End Date Taking? Authorizing Provider  albuterol (PROAIR HFA) 108 (90 BASE) MCG/ACT inhaler Inhale 2 puffs into the lungs 4 (four) times daily as needed. Rescue inhaler    Yes Historical Provider, MD  albuterol (PROVENTIL) (2.5 MG/3ML) 0.083% nebulizer solution Take 2.5 mg by nebulization every 6 (six) hours as needed. For wheezing.   Yes Historical Provider, MD  Ascorbic Acid (VITAMIN C) 1000 MG tablet Take 1,000 mg by mouth daily.   Yes Historical Provider, MD  aspirin EC 81 MG tablet Take 81 mg by mouth daily.   Yes Historical Provider, MD  CINNAMON PO Take 2,000 tablets by mouth daily.   Yes Historical Provider, MD  clonazePAM (KLONOPIN) 1 MG tablet Take 1 mg by mouth 2 (two) times daily as  needed. For anxiety.   Yes Historical Provider, MD  conjugated estrogens (PREMARIN) vaginal cream Place vaginally as directed.   Yes Historical Provider, MD  ezetimibe-simvastatin (VYTORIN) 10-40 MG per tablet Take 1 tablet by mouth at bedtime.   Yes Historical Provider, MD  Fluticasone-Salmeterol (ADVAIR) 250-50 MCG/DOSE AEPB Inhale 1 puff into the lungs every 12 (twelve) hours.   Yes Historical Provider, MD  metFORMIN (GLUCOPHAGE-XR) 500 MG 24 hr tablet Take 500 mg by mouth daily with breakfast.   Yes Historical Provider, MD   metoprolol succinate (TOPROL-XL) 25 MG 24 hr tablet Take 25 mg by mouth daily.   Yes Historical Provider, MD  Multiple Vitamin (MULTIVITAMIN) tablet Take 1 tablet by mouth daily.   Yes Historical Provider, MD  Omega-3 Fatty Acids (FISH OIL) 1200 MG CAPS Take 1 capsule by mouth daily.   Yes Historical Provider, MD  Red Yeast Rice 600 MG CAPS Take 1 capsule by mouth 2 (two) times daily.   Yes Historical Provider, MD  tiotropium (SPIRIVA) 18 MCG inhalation capsule Place 18 mcg into inhaler and inhale daily. If needed   Yes Historical Provider, MD  nitroGLYCERIN (NITROSTAT) 0.4 MG SL tablet Place 0.4 mg under the tongue as needed. For chest pain     Historical Provider, MD   Physical Exam: Filed Vitals:   02/16/12 1043 02/16/12 1317 02/16/12 1322 02/16/12 1513  BP:   124/54 128/68  Pulse:  94 94 92  Temp: 98.4 F (36.9 C)     TempSrc: Oral     Resp:  14 16 18   SpO2:  96% 95% 96%   BP 128/68  Pulse 92  Temp(Src) 98.4 F (36.9 C) (Oral)  Resp 18  SpO2 96%  General Appearance:    Alert, cooperative, Ussing accessory muscle to breath, able to speak in full sentences, appears older staged for  age  Head:    Normocephalic, without obvious abnormality, atraumatic  Eyes:    PERRL, conjunctiva/corneas clear, EOM's intact,     Ears:    Normal TM's and external ear canals, both ears  Nose:   Nares normal, septum midline, mucosa normal, no drainage    or sinus tenderness  Throat:   Lips, mucosa, and tongue normal; teeth and gums normal  Neck:   Supple, symmetrical, trachea midline, no adenopathy;    thyroid:  no enlargement/tenderness/nodules; no carotid   bruit or JVD  Back:     Symmetric, no curvature, ROM normal, no CVA tenderness  Lungs:     Bilateral ronchus, no wheezes, fine crackles.   Chest Wall:    No tenderness or deformity   Heart:    Regular rate and rhythm, S1 and S2 normal, no murmur, rub   or gallop     Abdomen:     Soft, non-tender, bowel sounds active all four quadrants,     no masses, no organomegaly        Extremities:   Extremities normal, atraumatic, no cyanosis or edema  Pulses:   2+ and symmetric all extremities  Skin:   Skin color, texture, turgor normal, no rashes or lesions  Lymph nodes:   Cervical, supraclavicular, and axillary nodes normal  Neurologic:   CNII-XII intact, normal strength, sensation and reflexes    throughout    Labs on Admission:  Basic Metabolic Panel:  Lab 02/16/12 1610  NA 144  K 4.1  CL 98  CO2 39*  GLUCOSE 155*  BUN 8  CREATININE 0.49*  CALCIUM 9.1  MG --  PHOS --   CBC:  Lab 02/16/12 1042  WBC 9.6  NEUTROABS 7.4  HGB 12.6  HCT 39.9  MCV 107.0*  PLT 150  CBG:  Lab 02/16/12 1059  GLUCAP 155*    Radiological Exams on Admission: Dg Chest Port 1 View  02/16/2012  *RADIOLOGY REPORT*  Clinical Data: Shortness of breath, weakness, chest pain  PORTABLE CHEST - 1 VIEW  Comparison: 01/30/2012  Findings: Increased interstitial markings/emphysematous changes. No pleural effusion or pneumothorax.  Cardiomediastinal silhouette is within normal limits.  IMPRESSION: No evidence of acute cardiopulmonary disease.  Increased interstitial markings/emphysematous changes.  Original Report Authenticated By: Charline Bills, M.D.    EKG: Sinus Tachycardia.  Assessment/Plan: 1-Acute-on-chronic respiratory failure: Secondary to COPD,emphysema. Hypercapnic.  Patient presents with worsening SOB, worsening cough, ABG with hypercapnia, respiratory failure. I will admit patient to step down unit, will continue with nebulizer treatments albuterol, ipratropium, Avelox, solumedrol. Smoking cessation counseling. BIPAP as needed. Chest x ray no evidence of PNA. Pulmonary consulted. Smoking cessation counseling. Hold metoprolol.   2-TOBACCO ABUSE: Smoking cessation counseling.   3-HYPERTENSION: hold metoprolol due to COPD exacerbation.  4-CAD: Continue with aspirin, hold metoprolol. Cycle cardiac enzyme due to chest pain. Continue with  nitroglycerin PRN.  5-COPD: See number 1.       Hartley Barefoot, MD  [facility] Pager [phone]  If 7PM-7AM, please contact night-coverage [email or web address] Password [facility] 02/16/2012, 3:36 PM

## 2012-02-16 NOTE — ED Notes (Signed)
Called respiratory to get ABG.

## 2012-02-16 NOTE — ED Provider Notes (Signed)
History     CSN: [id number]  Arrival date & time 02/16/12  1022   None     Chief Complaint  Patient presents with  . Shortness of Breath    (Consider location/radiation/quality/duration/timing/severity/associated sxs/prior treatment) Patient is a 71 y.o. female presenting with shortness of breath. The history is provided by the patient.  Shortness of Breath  The current episode started yesterday. The problem occurs continuously. The problem has been gradually worsening. The problem is moderate. The symptoms are relieved by nothing. The symptoms are aggravated by nothing. Associated symptoms include shortness of breath. Pertinent negatives include no chest pain, no fever and no cough. There was no intake of a foreign body. The Heimlich maneuver was not attempted. She has not inhaled smoke recently. She has had prior hospitalizations. Past medical history comments: copd. Urine output has been normal.    Past Medical History  Diagnosis Date  . CAD (coronary artery disease)     Stent RCA 2005, stent LAD 2006  . Hypertension   . Hyperlipemia   . COPD (chronic obstructive pulmonary disease)   . Tobacco abuse   . Fatigue   . Shortness of breath   . Myocardial infarction   . Heart murmur   . Recurrent upper respiratory infection (URI)   . Depression     Past Surgical History  Procedure Date  . Coronary stent placement     drug-eluting; right coronary artery  . Vesicovaginal fistula closure w/ tah   . Coronary angiography Nov 25, 2004    CYPHER stenting, left anterior descending  . Cardiac catheterization 2011    Family History  Problem Relation Age of Onset  . Diabetes Mother     deceased  . Alzheimer's disease Father     deceased    History  Substance Use Topics  . Smoking status: Current Everyday Smoker -- 0.2 packs/day for 50 years    Types: Cigarettes  . Smokeless tobacco: Never Used   Comment: started smoking at age 67/// Pt is currently trying to quit smoking,  using the electronic cigs, not longer smoking tobacco  . Alcohol Use: Yes     occasional wine    OB History    Grav Para Term Preterm Abortions TAB SAB Ect Mult Living                  Review of Systems  Constitutional: Negative for fever and fatigue.  HENT: Negative for congestion, drooling and neck pain.   Eyes: Negative for pain.  Respiratory: Positive for shortness of breath. Negative for cough.   Cardiovascular: Negative for chest pain.  Gastrointestinal: Negative for nausea, vomiting, abdominal pain and diarrhea.  Genitourinary: Negative for dysuria and hematuria.  Musculoskeletal: Negative for back pain and gait problem.  Skin: Negative for color change.  Neurological: Negative for dizziness and headaches.  Hematological: Negative for adenopathy.  Psychiatric/Behavioral: Negative for behavioral problems.  All other systems reviewed and are negative.    Allergies  Codeine  Home Medications   Current Outpatient Rx  Name Route Sig Dispense Refill  . ALBUTEROL SULFATE HFA 108 (90 BASE) MCG/ACT IN AERS Inhalation Inhale 2 puffs into the lungs 4 (four) times daily as needed. Rescue inhaler     . ALBUTEROL SULFATE (2.5 MG/3ML) 0.083% IN NEBU Nebulization Take 2.5 mg by nebulization every 6 (six) hours as needed. For wheezing.    Marland Kitchen VITAMIN C 1000 MG PO TABS Oral Take 1,000 mg by mouth daily.    . ASPIRIN  EC 81 MG PO TBEC Oral Take 81 mg by mouth daily.    Marland Kitchen CINNAMON PO Oral Take 2,000 tablets by mouth daily.    Marland Kitchen CLONAZEPAM 1 MG PO TABS Oral Take 1 mg by mouth 2 (two) times daily as needed. For anxiety.    . ESTROGENS, CONJUGATED 0.625 MG/GM VA CREA Vaginal Place vaginally as directed.    Marland Kitchen EZETIMIBE-SIMVASTATIN 10-40 MG PO TABS Oral Take 1 tablet by mouth at bedtime.    Marland Kitchen FLUTICASONE-SALMETEROL 250-50 MCG/DOSE IN AEPB Inhalation Inhale 1 puff into the lungs every 12 (twelve) hours.    Marland Kitchen METFORMIN HCL ER 500 MG PO TB24 Oral Take 500 mg by mouth daily with breakfast.    .  METOPROLOL SUCCINATE ER 25 MG PO TB24 Oral Take 25 mg by mouth daily.    Marland Kitchen ONE-DAILY MULTI VITAMINS PO TABS Oral Take 1 tablet by mouth daily.    Marland Kitchen FISH OIL 1200 MG PO CAPS Oral Take 1 capsule by mouth daily.    . RED YEAST RICE 600 MG PO CAPS Oral Take 1 capsule by mouth 2 (two) times daily.    Marland Kitchen TIOTROPIUM BROMIDE MONOHYDRATE 18 MCG IN CAPS Inhalation Place 18 mcg into inhaler and inhale daily. If needed    . NITROGLYCERIN 0.4 MG SL SUBL Sublingual Place 0.4 mg under the tongue as needed. For chest pain       BP 115/66  Pulse 101  Temp(Src) 98.4 F (36.9 C) (Oral)  Resp 14  SpO2 95%  Physical Exam  Constitutional: She is oriented to person, place, and time. She appears well-developed and well-nourished.  HENT:  Head: Normocephalic.  Mouth/Throat: No oropharyngeal exudate.  Eyes: Conjunctivae and EOM are normal. Pupils are equal, round, and reactive to light.  Neck: Normal range of motion. Neck supple.  Cardiovascular: Normal rate, regular rhythm, normal heart sounds and intact distal pulses.  Exam reveals no gallop and no friction rub.   No murmur heard. Pulmonary/Chest: She is in respiratory distress (mild tachypnea, diminished bs bilaterally). She has no wheezes.  Abdominal: Soft. Bowel sounds are normal. There is no tenderness.  Musculoskeletal: Normal range of motion. She exhibits no edema and no tenderness.  Neurological: She is alert and oriented to person, place, and time.  Skin: Skin is warm and dry.  Psychiatric: She has a normal mood and affect. Her behavior is normal.    ED Course  Procedures (including critical care time)  Labs Reviewed  CBC - Abnormal; Notable for the following:    RBC 3.73 (*)    MCV 107.0 (*)    All other components within normal limits  BASIC METABOLIC PANEL - Abnormal; Notable for the following:    CO2 39 (*)    Glucose, Bld 155 (*)    Creatinine, Ser 0.49 (*)    All other components within normal limits  GLUCOSE, CAPILLARY - Abnormal;  Notable for the following:    Glucose-Capillary 155 (*)    All other components within normal limits  POCT I-STAT 3, BLOOD GAS (G3+) - Abnormal; Notable for the following:    pH, Arterial 7.312 (*)    pCO2 arterial 85.1 (*)    pO2, Arterial 71.0 (*)    Bicarbonate 43.1 (*)    Acid-Base Excess 13.0 (*)    All other components within normal limits  DIFFERENTIAL  POCT I-STAT TROPONIN I  BLOOD GAS, ARTERIAL  CARDIAC PANEL(CRET KIN+CKTOT+MB+TROPI)  CARDIAC PANEL(CRET KIN+CKTOT+MB+TROPI)  CULTURE, EXPECTORATED SPUTUM-ASSESSMENT  PRO B NATRIURETIC PEPTIDE  Dg Chest Port 1 View  02/16/2012  *RADIOLOGY REPORT*  Clinical Data: Shortness of breath, weakness, chest pain  PORTABLE CHEST - 1 VIEW  Comparison: 01/30/2012  Findings: Increased interstitial markings/emphysematous changes. No pleural effusion or pneumothorax.  Cardiomediastinal silhouette is within normal limits.  IMPRESSION: No evidence of acute cardiopulmonary disease.  Increased interstitial markings/emphysematous changes.  Original Report Authenticated By: Charline Bills, M.D.     1. COPD exacerbation   2. Acute-on-chronic respiratory failure   3. Chronic airway obstruction, not elsewhere classified   4. Respiratory acidosis   5. Tobacco use disorder       Date: 02/16/2012  Rate: 112  Rhythm: sinus tachycardia  QRS Axis: normal  Intervals: normal  ST/T Wave abnormalities: normal  Conduction Disutrbances:none  Narrative Interpretation: Borderline right axis deviation, non-spec t wave abnormalities  Old EKG Reviewed: changes noted   MDM  4:35 PM 71 y.o. female w hx of copd, MI s/p stent x2 on asa pw gradual onset sob since yesterday. Mild relief w/ nebs at home. Pt pw w/ mild tachypnea, currently getting alb neb tx. Will get labs, CXR, solumedrol. Will get troponin now in plan for possible admission, but low suspicion for ACS, pt denies cp.   4:35 PM Pt feeling better, denies sob. Will get abg.   Family would feel  more comfortable if pt was admitted. Consulted hospitalist and pulmonology. Pt will be admitted to hospitalist.   Clinical Impression 1. COPD exacerbation   2. Acute-on-chronic respiratory failure   3. Chronic airway obstruction, not elsewhere classified   4. Respiratory acidosis   5. Tobacco use disorder        Purvis Sheffield, MD 02/16/12 [phone]

## 2012-02-16 NOTE — ED Notes (Signed)
Meal tray ordered for pt.  Snack given until tray arrives.

## 2012-02-16 NOTE — ED Notes (Signed)
Patient with history of COPD and now she is experiencing shortness of breath.  Patient also is alert and oriented x 3.  EMS gave patient breathing treatment prior to arrival.  Albuterol 5mg  currently going at this time.  Patient able to speak in full sentence.  MD at bedside

## 2012-02-16 NOTE — ED Notes (Signed)
RT to bedside for breathing treatment.

## 2012-02-16 NOTE — ED Notes (Signed)
Admitting MD and RN at bedside speaking to pt.

## 2012-02-16 NOTE — Consult Note (Signed)
Patient: [name] DOB: June 23, 1941 Date of Admission: 02/16/2012            Pulmonary consult  Date of Consult: 02/16/2012 MD requesting consult: Regolado Reason for consult: COPD  HPI - 71yo female with hx COPD followed by Dr. Maple Hudson, CAD, HTN who presented 5/16 with 3 week hx increased SOB and fatigue.  She was treated initially for bronchitis with amoxicillin and pred taper and did feel that her sputum cleared but her SOB cont to progress as did fatigue.  In ER she improved slightly with BD and was admitted by hospitalist who asked for pulmonary consult r/t abnormal ABG.   Allergies:  Allergies  Allergen Reactions  . Codeine      PMH: Past Medical History  Diagnosis Date  . CAD (coronary artery disease)     Stent RCA 2005, stent LAD 2006  . Hypertension   . Hyperlipemia   . COPD (chronic obstructive pulmonary disease)   . Tobacco abuse   . Fatigue   . Shortness of breath   . Myocardial infarction   . Heart murmur   . Recurrent upper respiratory infection (URI)   . Depression     Home meds: Albuterol Vit c Asa Klonopin Premarin vytorin advair 250/50  Metformin MVI Fish oil Red yeast rice  spiriva    Social Hx: History   Social History  . Marital Status: Married    Spouse Name: N/A    Number of Children: 3  . Years of Education: N/A   Occupational History  . Retired Journalist, newspaper   Social History Main Topics  . Smoking status: Current Everyday Smoker -- 0.2 packs/day for 50 years    Types: Cigarettes  . Smokeless tobacco: Never Used   Comment: started smoking at age 76/// Pt is currently trying to quit smoking, using the electronic cigs, not longer smoking tobacco  . Alcohol Use: Yes     occasional wine  . Drug Use: No  . Sexually Active: Not Currently    Birth Control/ Protection: Post-menopausal   Other Topics Concern  . Not on file   Social History Narrative   Husband smokes cigars     Family Hx: Family History    Problem Relation Age of Onset  . Diabetes Mother     deceased  . Alzheimer's disease Father     deceased     ROS: Per HPI.  Denies CP, purulent sputum.  All other systems reviewed and were neg.   Filed Vitals:   02/16/12 1317 02/16/12 1322 02/16/12 1513 02/16/12 1556  BP:  124/54 128/68 115/66  Pulse: 94 94 92 101  Temp:      TempSrc:      Resp: 14 16 18 14   SpO2: 96% 95% 96% 95%    chest X-ray Dg Chest Port 1 View  02/16/2012  *RADIOLOGY REPORT*  Clinical Data: Shortness of breath, weakness, chest pain  PORTABLE CHEST - 1 VIEW  Comparison: 01/30/2012  Findings: Increased interstitial markings/emphysematous changes. No pleural effusion or pneumothorax.  Cardiomediastinal silhouette is within normal limits.  IMPRESSION: No evidence of acute cardiopulmonary disease.  Increased interstitial markings/emphysematous changes.  Original Report Authenticated By: Charline Bills, M.D.     CBC    Component Value Date/Time   WBC 9.6 02/16/2012 1042   RBC 3.73* 02/16/2012 1042   HGB 12.6 02/16/2012 1042   HCT 39.9 02/16/2012 1042   PLT 150 02/16/2012 1042   MCV 107.0* 02/16/2012 1042  MCH 33.8 02/16/2012 1042   MCHC 31.6 02/16/2012 1042   RDW 12.6 02/16/2012 1042   LYMPHSABS 1.6 02/16/2012 1042   MONOABS 0.6 02/16/2012 1042   EOSABS 0.1 02/16/2012 1042   BASOSABS 0.0 02/16/2012 1042     BMET    Component Value Date/Time   NA 144 02/16/2012 1042   K 4.1 02/16/2012 1042   CL 98 02/16/2012 1042   CO2 39* 02/16/2012 1042   GLUCOSE 155* 02/16/2012 1042   BUN 8 02/16/2012 1042   CREATININE 0.49* 02/16/2012 1042   CALCIUM 9.1 02/16/2012 1042   GFRNONAA >90 02/16/2012 1042   GFRAA >90 02/16/2012 1042     ABG    Component Value Date/Time   PHART 7.312* 02/16/2012 1350   PCO2ART 85.1* 02/16/2012 1350   PO2ART 71.0* 02/16/2012 1350   HCO3 43.1* 02/16/2012 1350   TCO2 46 02/16/2012 1350   O2SAT 91.0 02/16/2012 1350      EXAM: General: thin female, NAD in bed Neuro: awake, alert, oriented,  MAE  CV:  s1s2 rrr PULM: resps even non labored on [place], diminished throughout, exp wheeze throughout GI: abd soft, +bs Extremities:  No edema    IMPRESSION/ PLAN:  AECOPD - ABG likely near baseline for her given elevated HCO3 proves chronic hypercarbia with renal compensation.  PLAN -  No bipap Do not hyper oxygenate F/u CXR  Sputum culture  Abx - would use Avelox  Steroids  BD Mucolytic  Smoking cessation  Aggressive pulm hygiene  May want to consider further cardiac w/u given significant fatigue as well    WHITEHEART,KATHRYN, NP 02/16/2012  4:01 PM Pager: [phone]   Admit to inpatient via [facility], PCCM will consult from a pulmonary standpoint.  Will give patient flutter valve, Humibid as a mucolytic, avelox for abx and steroids with smoking cessation.  No need for BiPAP as this is chronic respiratory acidosis with renal compensation, would strongly recommend against any O2 sat >92% as the patient will begin to accumulate more CO2 and develop acute on chronic respiratory failure.  Patient seen and examined, agree with above note.  I dictated the care and orders written for this patient under my direction.  Koren Bound, M.D. [phone]

## 2012-02-17 DIAGNOSIS — J9600 Acute respiratory failure, unspecified whether with hypoxia or hypercapnia: Secondary | ICD-10-CM

## 2012-02-17 DIAGNOSIS — J438 Other emphysema: Secondary | ICD-10-CM

## 2012-02-17 DIAGNOSIS — R0602 Shortness of breath: Secondary | ICD-10-CM

## 2012-02-17 LAB — CARDIAC PANEL(CRET KIN+CKTOT+MB+TROPI)
CK, MB: 3.6 ng/mL (ref 0.3–4.0)
Relative Index: INVALID (ref 0.0–2.5)
Total CK: 57 U/L (ref 7–177)
Total CK: 57 U/L (ref 7–177)
Troponin I: <0.3 ng/mL (ref <0.30)

## 2012-02-17 LAB — CBC
Platelets: 159 10*3/uL (ref 150–400)
RDW: 12.6 % (ref 11.5–15.5)
WBC: 15 10*3/uL — ABNORMAL HIGH (ref 4.0–10.5)

## 2012-02-17 LAB — BASIC METABOLIC PANEL
Calcium: 9.6 mg/dL (ref 8.4–10.5)
Creatinine, Ser: 0.49 mg/dL — ABNORMAL LOW (ref 0.50–1.10)
GFR calc Af Amer: >90 mL/min (ref >90)
Sodium: 140 mEq/L (ref 135–145)

## 2012-02-17 LAB — MAGNESIUM: Magnesium: 1.8 mg/dL (ref 1.5–2.5)

## 2012-02-17 MED ORDER — INSULIN ASPART 100 UNIT/ML ~~LOC~~ SOLN: 0.0000 [IU] | Freq: Every day | SUBCUTANEOUS | Status: DC

## 2012-02-17 MED ORDER — METHYLPREDNISOLONE SODIUM SUCC 40 MG IJ SOLR
40.0000 mg | Freq: Three times a day (TID) | INTRAMUSCULAR | Status: DC
  Filled 2012-02-17 (×3): qty 1

## 2012-02-17 MED ORDER — METHYLPREDNISOLONE SODIUM SUCC 40 MG IJ SOLR
40.0000 mg | INTRAMUSCULAR | Status: DC
  Administered 2012-02-17 – 2012-02-18 (×2): 40 mg via INTRAVENOUS
  Filled 2012-02-17 (×2): qty 1

## 2012-02-17 MED ORDER — INSULIN ASPART 100 UNIT/ML ~~LOC~~ SOLN
0.0000 [IU] | Freq: Three times a day (TID) | SUBCUTANEOUS | Status: DC
  Administered 2012-02-17: 11 [IU] via SUBCUTANEOUS
  Administered 2012-02-18: 3 [IU] via SUBCUTANEOUS
  Administered 2012-02-18: 2 [IU] via SUBCUTANEOUS

## 2012-02-17 NOTE — Progress Notes (Signed)
Patient: [name] DOB: 04-13-1941 Date of Admission: 02/16/2012            Pulmonary  FU  Date of Consult: 02/17/2012 MD requesting consult: Regolado Reason for consult: COPD  HPI - 71yo female with hx COPD followed by Dr. Maple Hudson, CAD, HTN who presented 5/16 with 3 week hx increased SOB and fatigue.  She was treated initially for bronchitis with amoxicillin and pred taper and did feel that her sputum cleared but her SOB cont to progress as did fatigue.  In ER she improved slightly with BD and was admitted by hospitalist who asked for pulmonary consult r/t abnormal ABG.   Allergies:  Allergies  Allergen Reactions  . Codeine    SUBJ - jittery, did not sleep, 'it's the prednisone'  Filed Vitals:   02/16/12 2100 02/17/12 0020 02/17/12 0435 02/17/12 0804  BP: 120/69 123/49 102/55 128/61  Pulse: 101 108 95 89  Temp:  98.2 F (36.8 C) 98.1 F (36.7 C) 98.5 F (36.9 C)  TempSrc:  Oral Oral Oral  Resp: 25 20 13 21   Height:      Weight:  51.6 kg (113 lb 12.1 oz)    SpO2: 96% 91% 92% 96%    chest X-ray Dg Chest Port 1 View  02/16/2012  *RADIOLOGY REPORT*  Clinical Data: Shortness of breath, weakness, chest pain  PORTABLE CHEST - 1 VIEW  Comparison: 01/30/2012  Findings: Increased interstitial markings/emphysematous changes. No pleural effusion or pneumothorax.  Cardiomediastinal silhouette is within normal limits.  IMPRESSION: No evidence of acute cardiopulmonary disease.  Increased interstitial markings/emphysematous changes.  Original Report Authenticated By: Charline Bills, M.D.     CBC    Component Value Date/Time   WBC 15.0* 02/17/2012 0836   RBC 3.51* 02/17/2012 0836   HGB 11.8* 02/17/2012 0836   HCT 36.0 02/17/2012 0836   PLT 159 02/17/2012 0836   MCV 102.6* 02/17/2012 0836   MCH 33.6 02/17/2012 0836   MCHC 32.8 02/17/2012 0836   RDW 12.6 02/17/2012 0836   LYMPHSABS 1.6 02/16/2012 1042   MONOABS 0.6 02/16/2012 1042   EOSABS 0.1 02/16/2012 1042   BASOSABS 0.0 02/16/2012  1042     BMET    Component Value Date/Time   NA 140 02/17/2012 0836   K 4.1 02/17/2012 0836   CL 96 02/17/2012 0836   CO2 37* 02/17/2012 0836   GLUCOSE 253* 02/17/2012 0836   BUN 12 02/17/2012 0836   CREATININE 0.49* 02/17/2012 0836   CALCIUM 9.6 02/17/2012 0836   GFRNONAA >90 02/17/2012 0836   GFRAA >90 02/17/2012 0836     ABG    Component Value Date/Time   PHART 7.312* 02/16/2012 1350   PCO2ART 85.1* 02/16/2012 1350   PO2ART 71.0* 02/16/2012 1350   HCO3 43.1* 02/16/2012 1350   TCO2 46 02/16/2012 1350   O2SAT 91.0 02/16/2012 1350      EXAM: General: thin female, NAD in bed Neuro: awake, alert, oriented, MAE  CV:  s1s2 rrr PULM: resps even non labored on [place], diminished throughout, no wheeze  GI: abd soft, +bs Extremities:  No edema    IMPRESSION/ PLAN:  AECOPD - ABG likely near baseline for her given elevated HCO3 proves chronic hypercarbia with renal compensation.  PLAN -  No bipap Do not hyper oxygenate - satn 88% acceptable, I turned down O2 to 3 L [place] F/u Sputum culture   Avelox  Decrease solumedrol to 40 q 24 - PO in 24 h, then taper over 2 weeks BD  Mucolytic  Smoking cessation  Aggressive pulm hygiene  May want to consider further cardiac w/u given significant fatigue as well  Avoid CNS depresants-  dc'd morphine, has been on klonopin long term     Oretha Milch., M.D. [phone]

## 2012-02-17 NOTE — Progress Notes (Signed)
Utilization Review Completed.  [name] T  02/17/2012  

## 2012-02-17 NOTE — Progress Notes (Signed)
Pt shows some beats of VTach, Pt assympomatic.  Revonda Standard, NP notified and pt's transfer changed to telemetry.   02/17/2012 11:58 AM Alanna Storti, Murtis Sink

## 2012-02-17 NOTE — Progress Notes (Signed)
[facility] [place] TEAM 1 - Stepdown/ICU TEAM  Subjective: Endorses feels much better than she did yesterday. Became tearful when describing prior attempts to stop smoking during smoking cessation counseling by the attending physician. Emotional support was given as well as encouragement. Currently denies chest pain.  Objective: Blood pressure 139/70, pulse 99, temperature 97 F (36.1 C), temperature source Oral, resp. rate 18, height 5\' 1"  (1.549 m), weight 52.118 kg (114 lb 14.4 oz), SpO2 96.00%.  Intake/Output from previous day: 05/16 0701 - 05/17 0700 In: -  Out: 525 [Urine:525] Intake/Output this shift: Total I/O In: 560 [P.O.:560] Out: 400 [Urine:400]  General appearance: alert, cooperative, appears older than stated age and no distress Resp: clear to auscultation bilaterally but BS are very distant - no active wheeze Cardio: regular rate and rhythm, S1, S2 normal, no murmur, click, rub or gallop GI: soft, non-tender; bowel sounds normal; no masses,  no organomegaly Extremities: extremities normal, atraumatic, no cyanosis or edema Neurologic: Grossly normal  Lab Results:  Basename 02/17/12 0836 02/16/12 2019  WBC 15.0* 9.5  HGB 11.8* 12.1  HCT 36.0 37.5  PLT 159 153   BMET  Basename 02/17/12 0836 02/16/12 2019 02/16/12 1042  NA 140 -- 144  K 4.1 -- 4.1  CL 96 -- 98  CO2 37* -- 39*  GLUCOSE 253* -- 155*  BUN 12 -- 8  CREATININE 0.49* 0.60 --  CALCIUM 9.6 -- 9.1    Studies/Results: Dg Chest Port 1 View  02/16/2012  *RADIOLOGY REPORT*  Clinical Data: Shortness of breath, weakness, chest pain  PORTABLE CHEST - 1 VIEW  Comparison: 01/30/2012  Findings: Increased interstitial markings/emphysematous changes. No pleural effusion or pneumothorax.  Cardiomediastinal silhouette is within normal limits.  IMPRESSION: No evidence of acute cardiopulmonary disease.  Increased interstitial markings/emphysematous changes.  Original Report Authenticated By: Charline Bills, M.D.   Medications: I have reviewed the patient's current medications.  Assessment/Plan:  Acute and chronic respiratory failure with hypercapnia and hypoxia *Markedly improved with treatment of acute COPD exacerbation *Based on pulmonary M.D. Notes avoid over oxygenation  Decompensated COPD (chronic obstructive pulmonary disease) *Continue empiric Avelox to cover possible bronchitis *Taper Solu-Medrol as recommended by pulmonary medicine *Likely can begin oral steroids in the morning with a taper *Continue supportive care with nebulizers and oxygen therapy  CAD *Patient is experiencing progressive fatigue and this could be an indicator of cardiac ischemia  *2-D echocardiogram pending  *Cardiac panel negative x3 *Continue aspirin and statin therapy  NSVT *Has had several bursts of 5-7 beats of wide complex tachycardia *Potassium is normal and greater than 4 *Check a magnesium level  Respiratory acidosis *Chronic and compensated   TOBACCO ABUSE *Cessation counseling at length per MD   HYPERTENSION *Blood pressure currently controlled  *On Toprol at home if blood pressure remains stable likely can resume tomorrow   diabetes mellitus *Home metformin on hold *Check CBGs and offer sliding scale insulin *Change to carb modified diet  anxiety *Chronic issue and likely related to COPD symptoms  Disposition *Transfer to telemetry since having wide complex tachycardia episodes   LOS: 1 day   Junious Silk, ANP pager [phone]  [facility] [email or web address] Password: [email or web address]  02/17/2012, 12:43 PM  I have personally examined this patient and reviewed the entire database. I have reviewed the above note, made any necessary editorial changes, and agree with its content.  Lonia Blood, MD [facility]

## 2012-02-17 NOTE — Progress Notes (Signed)
Pt to be transferred to unit 4700, report given to receiving nurse-Benequal.  Pt and spouse eduacted on transfer, both verbalized their understanding of teachings.  Pt stable to transport via wheel chair, on monitor. 02/17/2012 12:21 PM Clarissa Laird, [place] Sink

## 2012-02-17 NOTE — ED Provider Notes (Signed)
  I performed a history and physical examination of [name] and discussed her management with Dr. Romeo Apple.  I agree with the history, physical, assessment, and plan of care, with the following exceptions: None  In brief, this elderly F p/w worsening dyspnea and was admitted for COPD exacerbation after minimal improvement following ED interventions.  Pulmonology was consulted on the patient's care as well.  Elyse Jarvis, MD 02/17/12 1515

## 2012-02-17 NOTE — Progress Notes (Signed)
Patient arrived to unit, patient report received from nurse Bonita Quin on 2600.  Patient is stable___________D. Manson Passey RN

## 2012-02-18 LAB — CBC
HCT: 35.2 % — ABNORMAL LOW (ref 36.0–46.0)
MCH: 33.3 pg (ref 26.0–34.0)
MCV: 103.8 fL — ABNORMAL HIGH (ref 78.0–100.0)
RBC: 3.39 MIL/uL — ABNORMAL LOW (ref 3.87–5.11)
WBC: 19.3 10*3/uL — ABNORMAL HIGH (ref 4.0–10.5)

## 2012-02-18 LAB — BASIC METABOLIC PANEL
BUN: 21 mg/dL (ref 6–23)
CO2: 38 mEq/L — ABNORMAL HIGH (ref 19–32)
Calcium: 9.2 mg/dL (ref 8.4–10.5)
Chloride: 98 mEq/L (ref 96–112)
Creatinine, Ser: 0.55 mg/dL (ref 0.50–1.10)

## 2012-02-18 LAB — GLUCOSE, CAPILLARY: Glucose-Capillary: 141 mg/dL — ABNORMAL HIGH (ref 70–99)

## 2012-02-18 MED ORDER — MOXIFLOXACIN HCL 400 MG PO TABS: 400.0000 mg | ORAL_TABLET | Freq: Every day | ORAL | Status: DC

## 2012-02-18 MED ORDER — GUAIFENESIN ER 600 MG PO TB12: 1200.0000 mg | ORAL_TABLET | Freq: Two times a day (BID) | ORAL | Status: DC

## 2012-02-18 MED ORDER — ALBUTEROL SULFATE (2.5 MG/3ML) 0.083% IN NEBU: 2.5000 mg | INHALATION_SOLUTION | Freq: Four times a day (QID) | RESPIRATORY_TRACT | Status: DC

## 2012-02-18 MED ORDER — POLYETHYLENE GLYCOL 3350 17 G PO PACK
17.0000 g | PACK | Freq: Every day | ORAL | Status: DC
  Filled 2012-02-18: qty 1

## 2012-02-18 MED ORDER — PREDNISONE 10 MG PO TABS: ORAL_TABLET | ORAL | Status: DC

## 2012-02-18 NOTE — Discharge Summary (Signed)
DISCHARGE SUMMARY  [name]  MR#: [id number]  DOB:October 06, 1940  Date of Admission: 02/16/2012 Date of Discharge: 02/18/2012  Attending Physician:Bruin Bolger T  Patient's ZHY:[id number] R., MD, MD  Consults: Pulmonary Medicine  Disposition: d/c home with husband  Follow-up Appts: Discharge Orders    Future Appointments: Provider: Department: Dept Phone: Center:   02/29/2012 11:15 AM Waymon Budge, MD [facility] [phone] None     Future Orders Please Complete By Expires   Increase activity slowly        Tests Needing Follow-up: No specific tests  Discharge Diagnoses: Present on Admission:  .TOBACCO ABUSE .HYPERTENSION .CAD .Decompensated COPD (chronic obstructive pulmonary disease) .Acute and chronic respiratory failure with hypercapnia and hypoxia .Respiratory acidosis  Initial presentation: 71 year old with past medical history significant for hypertension, coronary artery disease status post stenting of the right coronary artery in 2005 and LAD in 2006, severe COPD FEV1/FVC 0.28, DLCO 42% (2011), current smoker who presented to the emergency department complaining of worsening shortness of breath for the last 2 days prior to admission.   Hospital Course:  Acute and chronic respiratory failure with hypercapnia and hypoxia  *Markedly improved with treatment of acute COPD exacerbation  *ambulates in hallway with expected degree of dyspnea, but without cp   Decompensated COPD (chronic obstructive pulmonary disease)  *to complete a 7 day coures of empiric Avelox to cover possible bronchitis  *Taper steroids very slowly (over 2 weeks) as recommended by pulmonary medicine  *Continue supportive care with nebulizers and oxygen therapy   CAD  *Patient is experiencing progressive fatigue and this could be an indicator of cardiac ischemia  *2-D echocardiogram does not comment on EF, but suggests normal LV fxn with only grade I diastolic  dysfnxn *Cardiac panel negative x3  *Continue aspirin and statin therapy  *consider outpt Cards f/u if fatigue persists once acute COPD exac fully resolved  NSVT  *Has had several bursts of 5-7 beats of wide complex tachycardia during hospital stay *Potassium is normal and greater than 4  *magnesium level is normal *likey due to frequent nebs + acute resp distress - cont BB as previously dosed  Respiratory acidosis  *Chronic and compensated   TOBACCO ABUSE  *Cessation counseling at length per MD - has been educated add nauseum on need to stop smoking immediately and entirely  HYPERTENSION  *Blood pressure currently controlled  *On Toprol at home if blood pressure remains stable likely can resume tomorrow   diabetes mellitus  *Home metformin to resume at d/c *carb modified diet   anxiety  *Chronic issue and likely related to COPD symptoms   Medication List  As of 02/18/2012  2:05 PM   TAKE these medications         aspirin EC 81 MG tablet   Take 81 mg by mouth daily.      CINNAMON PO   Take 2,000 tablets by mouth daily.      clonazePAM 1 MG tablet   Commonly known as: KLONOPIN   Take 1 mg by mouth 2 (two) times daily as needed. For anxiety.      conjugated estrogens vaginal cream   Commonly known as: PREMARIN   Place vaginally as directed.      Fish Oil 1200 MG Caps   Take 1 capsule by mouth daily.      Fluticasone-Salmeterol 250-50 MCG/DOSE Aepb   Commonly known as: ADVAIR   Inhale 1 puff into the lungs every 12 (twelve) hours.  guaiFENesin 600 MG 12 hr tablet   Commonly known as: MUCINEX   Take 2 tablets (1,200 mg total) by mouth 2 (two) times daily.      metFORMIN 500 MG 24 hr tablet   Commonly known as: GLUCOPHAGE-XR   Take 500 mg by mouth daily with breakfast.      metoprolol succinate 25 MG 24 hr tablet   Commonly known as: TOPROL-XL   Take 25 mg by mouth daily.      moxifloxacin 400 MG tablet   Commonly known as: AVELOX   Take 1 tablet (400  mg total) by mouth daily at 6 PM.      multivitamin tablet   Take 1 tablet by mouth daily.      nitroGLYCERIN 0.4 MG SL tablet   Commonly known as: NITROSTAT   Place 0.4 mg under the tongue as needed. For chest pain      predniSONE 10 MG tablet   Commonly known as: DELTASONE   Take 4 tablets by mouth 2x a day for 3 days, then 3 tablets 2x a day for 3 days, then 2 tablets 2x a day for 3 days, then 1 tablet 2x a day for 3 days then 1/2 tablet 2x a day for 3 days then stop      PROAIR HFA 108 (90 BASE) MCG/ACT inhaler   Generic drug: albuterol   Inhale 2 puffs into the lungs 4 (four) times daily as needed. Rescue inhaler      albuterol (2.5 MG/3ML) 0.083% nebulizer solution   Commonly known as: PROVENTIL   Take 3 mLs (2.5 mg total) by nebulization every 6 (six) hours. Use on schedule for the next 7 days - then change to use every 6 hours AS NEEDED for wheezing      Red Yeast Rice 600 MG Caps   Take 1 capsule by mouth 2 (two) times daily.      tiotropium 18 MCG inhalation capsule   Commonly known as: SPIRIVA   Place 18 mcg into inhaler and inhale daily. If needed      vitamin C 1000 MG tablet   Take 1,000 mg by mouth daily.      VYTORIN 10-40 MG per tablet   Generic drug: ezetimibe-simvastatin   Take 1 tablet by mouth at bedtime.           Day of Discharge BP 132/77  Pulse 105  Temp(Src) 98.3 F (36.8 C) (Oral)  Resp 20  Ht 5\' 1"  (1.549 m)  Wt 51.5 kg (113 lb 8.6 oz)  BMI 21.45 kg/m2  SpO2 90%  Physical Exam: General: No acute respiratory distress at rest - able to complete full sentences and carry on a converastion Lungs: very distant BS tho - no active wheeze - no focal crackles Cardiovascular: distant HS - no M, G, R - RRR  Abdomen: Nontender, nondistended, soft, bowel sounds positive, no rebound, no ascites, no appreciable mass Extremities: No significant cyanosis, clubbing, or edema bilateral lower extremities  CBC     Status: Abnormal   Collection Time    02/18/12  5:10 AM      Component Value Range   WBC 19.3 (*) 4.0 - 10.5 (K/uL)   RBC 3.39 (*) 3.87 - 5.11 (MIL/uL)   Hemoglobin 11.3 (*) 12.0 - 15.0 (g/dL)   HCT 16.1 (*) 09.6 - 46.0 (%)   MCV 103.8 (*) 78.0 - 100.0 (fL)   MCH 33.3  26.0 - 34.0 (pg)   MCHC 32.1  30.0 -  36.0 (g/dL)   RDW 40.9  81.1 - 91.4 (%)   Platelets 152  150 - 400 (K/uL)  BASIC METABOLIC PANEL     Status: Abnormal   Collection Time   02/18/12  5:10 AM      Component Value Range   Sodium 141  135 - 145 (mEq/L)   Potassium 4.5  3.5 - 5.1 (mEq/L)   Chloride 98  96 - 112 (mEq/L)   CO2 38 (*) 19 - 32 (mEq/L)   Glucose, Bld 172 (*) 70 - 99 (mg/dL)   BUN 21  6 - 23 (mg/dL)   Creatinine, Ser 7.82  0.50 - 1.10 (mg/dL)   Calcium 9.2  8.4 - 95.6 (mg/dL)   GFR calc non Af Amer >90  >90 (mL/min)   GFR calc Af Amer >90  >90 (mL/min)  GLUCOSE, CAPILLARY     Status: Abnormal   Collection Time   02/18/12  6:56 AM      Component Value Range   Glucose-Capillary 141 (*) 70 - 99 (mg/dL)   Comment 1 Notify RN     Follow-up Information    Follow up with Waymon Budge, MD on 02/29/2012. (keep your schedule appointment at 11:15AM)    Contact information:   [address] [employer] [place] [address] [phone]        Time spent in discharge (includes decision making & examination of pt): >30 minutes  02/18/2012, 2:05 PM   Lonia Blood, MD [facility] Office  [phone] Pager [phone]  On-Call/Text Page:      [email or web address]      password [facility]

## 2012-02-18 NOTE — Progress Notes (Signed)
Pts sat at rest was 90%. Pt ambulated in hallway- no severe SOB but sat dropped to 82%. Continued down hallway to nurses station and back to room. Sat quickly recovered back up to 92% within a few minutes. Hr up to 115- at rest  Hr is now 96

## 2012-02-18 NOTE — Discharge Instructions (Signed)

## 2012-02-18 NOTE — Progress Notes (Signed)
Subjective: Stable overnight.  Only minimal increased wob on oxygen and sitting in chair.  Minimal cough or mucus.  Objective: Vital signs in last 24 hours: Blood pressure 109/49, pulse 92, temperature 98.3 F (36.8 C), temperature source Oral, resp. rate 20, height 5\' 1"  (1.549 m), weight 51.5 kg (113 lb 8.6 oz), SpO2 94.00%.  Intake/Output from previous day: 05/17 0701 - 05/18 0700 In: 800 [P.O.:800] Out: 700 [Urine:700]   Physical Exam:   thin female in nad Nose without purulence or discharge Chest with decreased bs, no wheezing  Cor with rrr abd benign LE without edema, no cyanosis   Lab Results:  Basename 02/18/12 0510 02/17/12 0836 02/16/12 2019  WBC 19.3* 15.0* 9.5  HGB 11.3* 11.8* 12.1  HCT 35.2* 36.0 37.5  PLT 152 159 153   BMET  Basename 02/18/12 0510 02/17/12 0836 02/16/12 2019 02/16/12 1042  NA 141 140 -- 144  K 4.5 4.1 -- 4.1  CL 98 96 -- 98  CO2 38* 37* -- 39*  GLUCOSE 172* 253* -- 155*  BUN 21 12 -- 8  CREATININE 0.55 0.49* 0.60 --  CALCIUM 9.2 9.6 -- 9.1    Studies/Results: Dg Chest Port 1 View  02/16/2012  *RADIOLOGY REPORT*  Clinical Data: Shortness of breath, weakness, chest pain  PORTABLE CHEST - 1 VIEW  Comparison: 01/30/2012  Findings: Increased interstitial markings/emphysematous changes. No pleural effusion or pneumothorax.  Cardiomediastinal silhouette is within normal limits.  IMPRESSION: No evidence of acute cardiopulmonary disease.  Increased interstitial markings/emphysematous changes.  Original Report Authenticated By: Charline Bills, M.D.    Assessment/Plan: Patient Active Hospital Problem List:  Acute copd exacerbation: The pt appears to be doing better on current meds.  I do not know her usual baseline, but she does not have a lot of respiratory distress or wheezing.  I would recommend highly having her walk in halls to see how well she will do at home before sending home.  Acute on chronic respiratory failure secondary to  #1  Will check again on Monday if she is still in hospital.    Barbaraann Share, M.D. 02/18/2012, 10:08 AM

## 2012-02-20 LAB — GLUCOSE, CAPILLARY: Glucose-Capillary: 154 mg/dL — ABNORMAL HIGH (ref 70–99)

## 2012-02-21 ENCOUNTER — Other Ambulatory Visit: Payer: Medicare Other

## 2012-02-29 ENCOUNTER — Ambulatory Visit (INDEPENDENT_AMBULATORY_CARE_PROVIDER_SITE_OTHER): Payer: Medicare Other | Admitting: Internal Medicine

## 2012-02-29 ENCOUNTER — Encounter: Payer: Self-pay | Admitting: Internal Medicine

## 2012-02-29 VITALS — BP 122/80 | HR 64 | Ht 61.0 in | Wt 115.6 lb

## 2012-02-29 DIAGNOSIS — J4489 Other specified chronic obstructive pulmonary disease: Secondary | ICD-10-CM

## 2012-02-29 DIAGNOSIS — J449 Chronic obstructive pulmonary disease, unspecified: Secondary | ICD-10-CM

## 2012-02-29 MED ORDER — CLARITHROMYCIN 500 MG PO TABS: ORAL_TABLET | ORAL | Status: AC

## 2012-02-29 MED ORDER — ALBUTEROL SULFATE (2.5 MG/3ML) 0.083% IN NEBU: INHALATION_SOLUTION | RESPIRATORY_TRACT | Status: DC

## 2012-02-29 MED ORDER — ACLIDINIUM BROMIDE 400 MCG/ACT IN AEPB: 1.0000 | INHALATION_SPRAY | Freq: Two times a day (BID) | RESPIRATORY_TRACT | Status: DC

## 2012-02-29 NOTE — Progress Notes (Signed)
Patient ID: [name], female    DOB: [birth year], 71 y.o.   MRN: [id number]  HPI 03/21/11- 80 yoF followed for COPD, ongoing tobacco use, complicated by CAD Last here Nov 18, 2010- note reviewed.  Continues in a Spiriva drug trial CXR 01/25/11 - stable COPD, NAD She continues to find that sleeping with oxygen makes her feel much better. Continues Advair and Spiriva regularly. Rare need for rescue inhaler.  07/08/11 62 yoF followed for COPD, ongoing tobacco use, complicated by CAD, DM   Husband and daughter here Acute visit- Dyspnea   She complains of feeling somehow bad with more short of breath particularly in the last 2-3 weeks. Her husband says she has been steadily declining, weaker, for 4 or 5 months. She complains of pain in her left bicep if she tries to reach around to her back but this is not exertional pain otherwise and she denies angina-type pain or pleuritic pain. Mainly she is complaining of shortness of breath. There is some increased cough with scant clear mucus. She gets a burning sensation in the upper sternum just in the last few days. Some nausea last week. She was seen at her primary care office 4 days ago , given a Rocephin injection and started on Levaquin. Chest x-ray was done and she was told it was clear. She denies swollen glands, rash, nausea or vomiting, change in bowel or bladder , swelling or aching in legs. EKG- RSR, RAE, NAD  07/15/11-69 yoF followed for COPD, ongoing tobacco use, complicated by CAD, DM..........Marland Kitchenhusband here Much stronger and feeling much better than last time. Has reduced but not quit smoking-discussed. Notes desat walking through home on room air to as low as 83%. Discussed use of O2 for goal sat >/= 88% and discussed CO2 retention. Coughs up a little thick clear mucus.   10/31/11- 82 yoF followed for COPD, ongoing tobacco use, complicated by CAD, DM..........Marland Kitchenhusband here In past week just gotten over the breathing concerns since seen last  time and in December (when had flu like symptoms); Has been using O2 more-having low O2 levels when not on O2.(low as 75%). Unfortunately she has continued to smoke a few cigarettes daily against advice. She has been dealing with a sustained exacerbation of COPD really since October. Using oxygen continuously between 2 and 3 L per minute/[facility]. We talked about pulmonary rehabilitation. She never took the invitation to try the [facility] program. She said then that she would [facility] but she didn't do that either. She paces herself at home wearing oxygen, to do limited housework. She can move a vacuum cleaner but not move furniture. Bothersome pain in the left shoulder with decreased range of motion. Little cough, phlegm, exertional chest pain, edema. No blood. PFT-09/06/2010-reviewed again with her and her husband. Very severe obstructive airways disease, emphysema pattern, FEV1/FVC 0.28, DLCO 42%. She has a home nebulizer machine with albuterol and that works better for her than metered-dose inhalers.  01/30/12- 78 yoF followed for COPD, ongoing tobacco use, complicated by CAD, DM..........Marland Kitchenhusband here  Pt states breathing is worsening, increased sob. Pt is wearing O2 24/7 at 2-3LPM, pt states she is trying to quit smoking and is currently using the electronic cigarrette rather than the tobacco. She is worrking toward quitting. Pt states mucus is increased, green-yellow phlegm Now with 2 days of green sputum and watery rhinorrhea without sore throat or fever. She restarted Spiriva   CXR 07/04/11- IMPRESSION:  COPD. No  active lung disease.  Original Report Authenticated By: Juline Patch, M.D.    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 Post hospital May 16 through May 18 for exacerbation of COPD. Daughter here  today. Has finally quit smoking. Shows me some green tinged mucus, not frankly purulent. Registers a CAT score of 11.  Review of Systems- see HPI Constitutional:   No-   weight loss, night sweats, fevers, chills,+ fatigue, lassitude. HEENT:   No-  headaches, difficulty swallowing, tooth/dental problems, sore throat,       No-  sneezing, itching, ear ache, nasal congestion, post nasal drip,  CV:  No-   chest pain, orthopnea, PND, swelling in lower extremities, dizziness, palpitations Resp: +  shortness of breath with exertion or at rest.              +  productive cough,  No non-productive cough,  No-  coughing up of blood.              No-   change in color of mucus.  No- wheezing.   Skin: No-   rash or lesions. GI:  No-   heartburn, indigestion, abdominal pain, nausea, vomiting,  GU:  MS:  No-   joint pain or swelling.   Neuro- nothing unusual Psych:  No- change in mood or affect. No depression or anxiety.  No memory loss.  OBJ General- Alert, Oriented, Much stronger.  O2 2L sat 90% Skin- rash-none, lesions- none, excoriation- none Lymphadenopathy- none Head- atraumatic            Eyes- Gross vision intact, PERRLA, conjunctivae clear secretions            Ears- Hearing, canals- normal            Nose- Clear, no-Septal dev, mucus, polyps, erosion, perforation             Throat- Mallampati II , mucosa clear , drainage- none, tonsils- atrophic Neck- flexible , trachea midline, no stridor , thyroid nl, carotid no bruit Chest - symmetrical excursion , unlabored           Heart/CV- RRR , no murmur , no gallop  , no rub, nl s1 s2                           - JVD- none , edema- none, stasis changes- none, varices- none. Good capillary filling.           Lung- very distant, wheeze-trace, cough- none , dullness-none, rub- none           Chest wall-  Abd-  Br/ Gen/ Rectal- Not done, not indicated Extrem- cyanosis- none, clubbing, none, atrophy- none, strength- nl Neuro- grossly intact to  observation

## 2012-02-29 NOTE — Patient Instructions (Signed)
Order- DME/ [facility]- nebulizer and neb med to switch to DME source (Medicare)            Scripts are printed  Script and sample Tudorza 1 puff, twice daily  Finish prednisone taper

## 2012-03-04 NOTE — Assessment & Plan Note (Addendum)
Finishing post hospital prednisone taper- bronchitic component is much improved after recent hospitalization for exacerbation, and subsequent smoking cessation. Plan-finish post hospital prednisone taper. Use DME company to supply a nebulizer medication. Continue Tudorza.

## 2012-04-17 ENCOUNTER — Telehealth: Payer: Self-pay | Admitting: Internal Medicine

## 2012-04-17 NOTE — Telephone Encounter (Signed)
Called, spoke with pt who states she is coughing some but "not that much" x 1 wk.  Cough is prod at times with clear to white mucus.  Using albuterol neb 2-3 times daily which helps.  Denies increased SOB, wheezing, chest tightness, or fever.  I offered OV with TP but pt declined this stating she's not coughing much and didn't need to come in until her scheduled OV on Aug 22 with Dr. Maple Hudson.  I advised pt to call back if she changes her mind or symptoms do not improve or worsen.  She verbalized understanding of this.  Nothing further needed at this time.

## 2012-05-02 ENCOUNTER — Other Ambulatory Visit: Payer: Self-pay | Admitting: Cardiology

## 2012-05-07 ENCOUNTER — Telehealth: Payer: Self-pay | Admitting: Cardiovascular Disease

## 2012-05-07 NOTE — Telephone Encounter (Signed)
Pt notified that vytorin samples at front desk--pt agrees--nt

## 2012-05-07 NOTE — Telephone Encounter (Signed)
New problem:  Patient calling need sample of vytroin 10/40

## 2012-05-24 ENCOUNTER — Ambulatory Visit (INDEPENDENT_AMBULATORY_CARE_PROVIDER_SITE_OTHER): Payer: Medicare Other | Admitting: Internal Medicine

## 2012-05-24 ENCOUNTER — Encounter: Payer: Self-pay | Admitting: Internal Medicine

## 2012-05-24 ENCOUNTER — Ambulatory Visit (INDEPENDENT_AMBULATORY_CARE_PROVIDER_SITE_OTHER)
Admission: RE | Admit: 2012-05-24 | Discharge: 2012-05-24 | Disposition: A | Discharge status: Home / Self Care | Payer: Medicare Other | Source: Ambulatory Visit | Attending: Internal Medicine | Admitting: Internal Medicine

## 2012-05-24 VITALS — BP 110/62 | HR 76 | Ht 61.0 in | Wt 121.6 lb

## 2012-05-24 DIAGNOSIS — J4489 Other specified chronic obstructive pulmonary disease: Secondary | ICD-10-CM

## 2012-05-24 DIAGNOSIS — F17200 Nicotine dependence, unspecified, uncomplicated: Secondary | ICD-10-CM

## 2012-05-24 DIAGNOSIS — J449 Chronic obstructive pulmonary disease, unspecified: Secondary | ICD-10-CM

## 2012-05-24 DIAGNOSIS — J441 Chronic obstructive pulmonary disease with (acute) exacerbation: Secondary | ICD-10-CM

## 2012-05-24 NOTE — Patient Instructions (Addendum)
Order- CXR dx COPD exacerbation  Use your Flutter device to try to help clear mucus from your chest     Blow through 4 times per set, three sets per day when needed  Order- refer to Pulmonary Rehab   IV COPD

## 2012-05-24 NOTE — Progress Notes (Signed)
Patient ID: [name], female    DOB: [birth year], 71 y.o.   MRN: [id number]  HPI 03/21/11- 80 yoF followed for COPD, ongoing tobacco use, complicated by CAD Last here Nov 18, 2010- note reviewed.  Continues in a Spiriva drug trial CXR 01/25/11 - stable COPD, NAD She continues to find that sleeping with oxygen makes her feel much better. Continues Advair and Spiriva regularly. Rare need for rescue inhaler.  07/08/11 62 yoF followed for COPD, ongoing tobacco use, complicated by CAD, DM   Husband and daughter here Acute visit- Dyspnea   She complains of feeling somehow bad with more short of breath particularly in the last 2-3 weeks. Her husband says she has been steadily declining, weaker, for 4 or 5 months. She complains of pain in her left bicep if she tries to reach around to her back but this is not exertional pain otherwise and she denies angina-type pain or pleuritic pain. Mainly she is complaining of shortness of breath. There is some increased cough with scant clear mucus. She gets a burning sensation in the upper sternum just in the last few days. Some nausea last week. She was seen at her primary care office 4 days ago , given a Rocephin injection and started on Levaquin. Chest x-ray was done and she was told it was clear. She denies swollen glands, rash, nausea or vomiting, change in bowel or bladder , swelling or aching in legs. EKG- RSR, RAE, NAD  07/15/11-69 yoF followed for COPD, ongoing tobacco use, complicated by CAD, DM..........Marland Kitchenhusband here Much stronger and feeling much better than last time. Has reduced but not quit smoking-discussed. Notes desat walking through home on room air to as low as 83%. Discussed use of O2 for goal sat >/= 88% and discussed CO2 retention. Coughs up a little thick clear mucus.   10/31/11- 82 yoF followed for COPD, ongoing tobacco use, complicated by CAD, DM..........Marland Kitchenhusband here In past week just gotten over the breathing concerns since seen last  time and in December (when had flu like symptoms); Has been using O2 more-having low O2 levels when not on O2.(low as 75%). Unfortunately she has continued to smoke a few cigarettes daily against advice. She has been dealing with a sustained exacerbation of COPD really since October. Using oxygen continuously between 2 and 3 L per minute/[facility]. We talked about pulmonary rehabilitation. She never took the invitation to try the [facility] program. She said then that she would [facility] but she didn't do that either. She paces herself at home wearing oxygen, to do limited housework. She can move a vacuum cleaner but not move furniture. Bothersome pain in the left shoulder with decreased range of motion. Little cough, phlegm, exertional chest pain, edema. No blood. PFT-09/06/2010-reviewed again with her and her husband. Very severe obstructive airways disease, emphysema pattern, FEV1/FVC 0.28, DLCO 42%. She has a home nebulizer machine with albuterol and that works better for her than metered-dose inhalers.  01/30/12- 78 yoF followed for COPD, ongoing tobacco use, complicated by CAD, DM..........Marland Kitchenhusband here  Pt states breathing is worsening, increased sob. Pt is wearing O2 24/7 at 2-3LPM, pt states she is trying to quit smoking and is currently using the electronic cigarrette rather than the tobacco. She is worrking toward quitting. Pt states mucus is increased, green-yellow phlegm Now with 2 days of green sputum and watery rhinorrhea without sore throat or fever. She restarted Spiriva   CXR 07/04/11- IMPRESSION:  COPD. No  active lung disease.  Original Report Authenticated By: Juline Patch, M.D.    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 Post hospital May 16 through May 18 for exacerbation of COPD. Daughter here  today. Has finally quit smoking. Shows me some green tinged mucus, not frankly purulent. Registers a CAT score of 11.  05/24/12- 70 yoF followed for COPD, ongoing tobacco use, complicated by CAD, DM..........Marland Kitchenhusband here Has noticed somedays coughing and able to cough something up-green in color but not often; having more "hot flashes" but unware of cause. SOB with activity. Describes occasional "white bumps" on her feet.  Review of Systems- see HPI Constitutional:   No-   weight loss, +night sweats, fevers, chills,+ fatigue, lassitude. HEENT:   No-  headaches, difficulty swallowing, tooth/dental problems, sore throat,       No-  sneezing, itching, ear ache, nasal congestion, post nasal drip,  CV:  No-   chest pain, orthopnea, PND, +swelling in lower extremities, dizziness, palpitations Resp: +  shortness of breath with exertion or at rest.              +  productive cough,  No non-productive cough,  No-  coughing up of blood.              Occ +  change in color of mucus.  No- wheezing.   Skin: No-   rash or lesions. GI:  No-   heartburn, indigestion, abdominal pain, nausea, vomiting,  GU:  MS:  No-   joint pain or swelling.   Neuro- nothing unusual Psych:  No- change in mood or affect. No depression or anxiety.  No memory loss.  OBJ General- Alert, Oriented, Much stronger.  O2 2L sat 90% Skin- rash-none, lesions- none, excoriation- none Lymphadenopathy- none Head- atraumatic            Eyes- Gross vision intact, PERRLA, conjunctivae clear secretions            Ears- Hearing, canals- normal            Nose- Clear, no-Septal dev, mucus, polyps, erosion, perforation             Throat- Mallampati II , mucosa clear , drainage- none, tonsils- atrophic Neck- flexible , trachea midline, no stridor , thyroid nl, carotid no bruit Chest - symmetrical excursion , unlabored           Heart/CV- RRR , no murmur , no gallop  , no rub, nl s1 s2                           - JVD- none , edema- none,  stasis changes- none, varices- none. Good capillary filling.           Lung- +very distant, wheeze-trace, cough- none , dullness-none, rub- none           Chest wall-  Abd-  Br/ Gen/ Rectal- Not done, not indicated Extrem- cyanosis- none, clubbing, none, atrophy- none, strength- nl Neuro- grossly intact to observation

## 2012-05-29 ENCOUNTER — Telehealth: Payer: Self-pay | Admitting: Internal Medicine

## 2012-05-29 ENCOUNTER — Other Ambulatory Visit: Payer: Self-pay | Admitting: Cardiovascular Disease

## 2012-05-29 MED ORDER — METOPROLOL SUCCINATE ER 25 MG PO TB24: 25.0000 mg | ORAL_TABLET | Freq: Every day | ORAL | Status: DC

## 2012-05-29 NOTE — Telephone Encounter (Signed)
Notes Recorded by Waymon Budge, MD on 05/24/2012 at 8:28 PM CXR- COPD changes, nothing new or active seen  I spoke with patient about results and she verbalized understanding and had no questions

## 2012-05-31 NOTE — Assessment & Plan Note (Signed)
Severe COPD. We may improve airway clearance with use of a flutter device as discussed.

## 2012-05-31 NOTE — Assessment & Plan Note (Signed)
She has successfully quit smoking 

## 2012-08-01 ENCOUNTER — Encounter (HOSPITAL_COMMUNITY)
Admission: RE | Admit: 2012-08-01 | Discharge: 2012-08-01 | Disposition: A | Discharge status: Home / Self Care | Payer: Medicare Other | Source: Ambulatory Visit | Attending: Internal Medicine | Admitting: Internal Medicine

## 2012-08-01 ENCOUNTER — Encounter (HOSPITAL_COMMUNITY): Payer: Self-pay

## 2012-08-01 DIAGNOSIS — J449 Chronic obstructive pulmonary disease, unspecified: Secondary | ICD-10-CM | POA: Insufficient documentation

## 2012-08-01 DIAGNOSIS — Z5189 Encounter for other specified aftercare: Secondary | ICD-10-CM | POA: Insufficient documentation

## 2012-08-01 DIAGNOSIS — J4489 Other specified chronic obstructive pulmonary disease: Secondary | ICD-10-CM | POA: Insufficient documentation

## 2012-08-01 HISTORY — DX: Type 2 diabetes mellitus without complications: E11.9

## 2012-08-01 HISTORY — DX: Occlusion and stenosis of unspecified carotid artery: I65.29

## 2012-08-01 NOTE — Progress Notes (Signed)
[name] came today accompanied by her daughter for orientation to Pulmonary Rehab.  Health history and medications reviewed with patient and her daughter.  Goals set.  Demonstration and practice of PLB using pulse oximeter.  Patient able to return demonstration satisfactorily. Safety and hand hygiene in the exercise area reviewed with patient.  Patient voices understanding.  Walk test delayed until first day of exercise due to time limitation.  She has stopped smoking cigarettes however she is using e-cigarettes.  We discussed nicotine and harmful effects and offered support and encouragement which we will continue as she starts the exercise program.  She was given a "fake" cigarette today and we will continue to try other methods to help.  We look forward to working with this nice lady to help her achieve a better quality of life.

## 2012-08-02 ENCOUNTER — Encounter (HOSPITAL_COMMUNITY)
Admission: RE | Admit: 2012-08-02 | Discharge: 2012-08-02 | Disposition: A | Discharge status: Home / Self Care | Payer: Medicare Other | Source: Ambulatory Visit | Attending: Internal Medicine | Admitting: Internal Medicine

## 2012-08-02 NOTE — Progress Notes (Signed)
First day of exercise.  She was oriented to equipment use, safety, RPE and Dyspnea scale and rest breaks.  6 min walk test done on 2nd station.  Tolerated exercise well.

## 2012-08-03 ENCOUNTER — Other Ambulatory Visit: Payer: Self-pay | Admitting: Cardiovascular Disease

## 2012-08-03 NOTE — Telephone Encounter (Signed)
PT called regarding rx for Nitro. Pharmacy says we will not refill, but they sent refill 4:17. Pt called [place] @ 4:20 after calling. Fax is being sent.   Brigham Cobbins, CMA

## 2012-08-07 ENCOUNTER — Encounter (HOSPITAL_COMMUNITY)
Admission: RE | Admit: 2012-08-07 | Discharge: 2012-08-07 | Disposition: A | Discharge status: Home / Self Care | Payer: Medicare Other | Source: Ambulatory Visit | Attending: Internal Medicine | Admitting: Internal Medicine

## 2012-08-07 DIAGNOSIS — J449 Chronic obstructive pulmonary disease, unspecified: Secondary | ICD-10-CM | POA: Insufficient documentation

## 2012-08-07 DIAGNOSIS — Z5189 Encounter for other specified aftercare: Secondary | ICD-10-CM | POA: Insufficient documentation

## 2012-08-07 DIAGNOSIS — J4489 Other specified chronic obstructive pulmonary disease: Secondary | ICD-10-CM | POA: Insufficient documentation

## 2012-08-08 ENCOUNTER — Telehealth: Payer: Self-pay | Admitting: Internal Medicine

## 2012-08-08 NOTE — Telephone Encounter (Signed)
Member #[id number] Tudorza APPROVED through 10/02/13. Pharmacy notified.

## 2012-08-09 ENCOUNTER — Other Ambulatory Visit: Payer: Self-pay | Admitting: *Deleted

## 2012-08-09 ENCOUNTER — Encounter (HOSPITAL_COMMUNITY)
Admission: RE | Admit: 2012-08-09 | Discharge: 2012-08-09 | Disposition: A | Discharge status: Home / Self Care | Payer: Medicare Other | Source: Ambulatory Visit | Attending: Internal Medicine | Admitting: Internal Medicine

## 2012-08-09 DIAGNOSIS — I6529 Occlusion and stenosis of unspecified carotid artery: Secondary | ICD-10-CM

## 2012-08-14 ENCOUNTER — Encounter: Payer: Self-pay | Admitting: Cardiovascular Disease

## 2012-08-14 ENCOUNTER — Encounter (HOSPITAL_COMMUNITY): Payer: Medicare Other

## 2012-08-14 ENCOUNTER — Ambulatory Visit (INDEPENDENT_AMBULATORY_CARE_PROVIDER_SITE_OTHER): Payer: Medicare Other | Admitting: Cardiovascular Disease

## 2012-08-14 ENCOUNTER — Encounter (INDEPENDENT_AMBULATORY_CARE_PROVIDER_SITE_OTHER): Payer: Medicare Other

## 2012-08-14 VITALS — BP 122/61 | HR 74 | Ht 61.0 in | Wt 122.0 lb

## 2012-08-14 DIAGNOSIS — I6529 Occlusion and stenosis of unspecified carotid artery: Secondary | ICD-10-CM

## 2012-08-14 DIAGNOSIS — I2510 Atherosclerotic heart disease of native coronary artery without angina pectoris: Secondary | ICD-10-CM

## 2012-08-14 MED ORDER — NITROGLYCERIN 0.4 MG SL SUBL: 0.4000 mg | SUBLINGUAL_TABLET | SUBLINGUAL | Status: DC | PRN

## 2012-08-14 NOTE — Patient Instructions (Addendum)
Your physician wants you to follow-up in:  6 months. You will receive a reminder letter in the mail two months in advance. If you don't receive a letter, please call our office to schedule the follow-up appointment.  Your physician recommends that you return for fasting lab work  Later this week--Lipid,liver,CBC, BMP, TSH  Your physician has recommended you make the following change in your medication:  Stop Toprol to see if this helps with fatigue

## 2012-08-14 NOTE — Progress Notes (Signed)
History of Present Illness: 71 yo WF with prior history of HTN, HLD, COPD and CAD status post stenting of right coronary artery in 2005 and LAD in 2006 here for cardiac follow up. She has been followed in the past by Dr. Gala Romney. She underwent cardiac catheterization on July 09, 2010 due to CP, showed patent stents to the LAD and RCA with nonobstructive disease throughout. It was felt the majority of her symptoms were probably due to COPD and she was counseled to stop smoking. She was sent home on Imdur. The patient said she became extremely dizzy after she took one dose and had to stop it. Referred to pulmonary. Saw Dr. Maple Hudson. PFTs with severe COPD. Carotid u/s today with RICA 60-79%, LICA 60-79% No neuro symptoms.   She is here today for follow up. She has no chest pains. Her breathing is at baseline. She wears supplemental oxygen No significant CP. No symptoms of heart failure. No change in weight. No palpitations. She thinks her beta blocker is causing hair loss and fatigue.   Primary Care Physician: Andi Devon   Last Lipid Profile:  Needs updating.     Past Medical History  Diagnosis Date  . CAD (coronary artery disease)     Stent RCA 2005, stent LAD 2006  . Hypertension   . Hyperlipemia   . COPD (chronic obstructive pulmonary disease)   . Tobacco abuse   . Fatigue   . Shortness of breath   . Myocardial infarction   . Heart murmur   . Recurrent upper respiratory infection (URI)   . Depression   . Carotid artery occlusion   . Diabetes mellitus without complication     Past Surgical History  Procedure Date  . Coronary stent placement     drug-eluting; right coronary artery  . Vesicovaginal fistula closure w/ tah   . Coronary angiography Nov 25, 2004    CYPHER stenting, left anterior descending  . Cardiac catheterization 2011    Current Outpatient Prescriptions  Medication Sig Dispense Refill  . Aclidinium Bromide (TUDORZA PRESSAIR) 400 MCG/ACT AEPB Inhale 1  Device into the lungs 2 (two) times daily.  1 each  prn  . albuterol (PROAIR HFA) 108 (90 BASE) MCG/ACT inhaler Inhale 2 puffs into the lungs 4 (four) times daily as needed. Rescue inhaler      . albuterol (PROVENTIL) (2.5 MG/3ML) 0.083% nebulizer solution 1 neb up to 4 times daily if needed  300 mL  prn  . Ascorbic Acid (VITAMIN C) 1000 MG tablet Take 1,000 mg by mouth daily.      Marland Kitchen aspirin EC 81 MG tablet Take 81 mg by mouth daily.      . calcium citrate-vitamin D 200-200 MG-UNIT TABS Take 1 tablet by mouth daily.      Marland Kitchen CINNAMON PO Take 2,000 tablets by mouth daily.      . clonazePAM (KLONOPIN) 1 MG tablet Take 1 mg by mouth 2 (two) times daily as needed. For anxiety.      . conjugated estrogens (PREMARIN) vaginal cream Place vaginally as directed.      . ezetimibe-simvastatin (VYTORIN) 10-40 MG per tablet Take 1 tablet by mouth at bedtime.      . Fluticasone-Salmeterol (ADVAIR) 250-50 MCG/DOSE AEPB Inhale 1 puff into the lungs every 12 (twelve) hours.      Marland Kitchen guaiFENesin (MUCINEX) 600 MG 12 hr tablet Take 2 tablets (1,200 mg total) by mouth 2 (two) times daily.  40 tablet  0  . metFORMIN (  GLUCOPHAGE-XR) 500 MG 24 hr tablet Take 500 mg by mouth daily with breakfast.      . metoprolol succinate (TOPROL-XL) 25 MG 24 hr tablet Take 1 tablet (25 mg total) by mouth daily.  30 tablet  5  . Multiple Vitamin (MULTIVITAMIN) tablet Take 1 tablet by mouth daily.      Marland Kitchen NITROSTAT 0.4 MG SL tablet TAKE 1 TABLET BY MOUTH AS NEEDED CHEST PAIN  25 tablet  4  . Omega-3 Fatty Acids (FISH OIL) 1200 MG CAPS Take 1 capsule by mouth daily.        Allergies  Allergen Reactions  . Codeine     History   Social History  . Marital Status: Married    Spouse Name: N/A    Number of Children: 3  . Years of Education: N/A   Occupational History  . Retired Journalist, newspaper   Social History Main Topics  . Smoking status: Former Smoker -- 0.2 packs/day for 50 years    Types: Cigarettes    Quit date:  02/15/2012  . Smokeless tobacco: Never Used     Comment: started smoking at age 60/// Pt is currently trying to quit smoking, using the electronic cigs, not longer smoking tobacco  . Alcohol Use: Yes     Comment: occasional wine  . Drug Use: No  . Sexually Active: Not Currently    Birth Control/ Protection: Post-menopausal   Other Topics Concern  . Not on file   Social History Narrative   Husband smokes cigars    Family History  Problem Relation Age of Onset  . Diabetes Mother     deceased  . Heart disease Mother   . Alzheimer's disease Father     deceased  . Diabetes Sister   . Heart attack Sister     Review of Systems:  As stated in the HPI and otherwise negative.   BP 122/61  Pulse 74  Ht 5\' 1"  (1.549 m)  Wt 122 lb (55.339 kg)  BMI 23.05 kg/m2  Physical Examination: General: Well developed, well nourished, NAD HEENT: OP clear, mucus membranes moist SKIN: warm, dry. No rashes. Neuro: No focal deficits Musculoskeletal: Muscle strength 5/5 all ext Psychiatric: Mood and affect normal Neck: No JVD, bilateral  carotid bruits, no thyromegaly, no lymphadenopathy. Lungs:Poor air movement bilaterally. No wheezes, rhonci, crackles Cardiovascular: Regular rate and rhythm. No murmurs, gallops or rubs. Abdomen:Soft. Bowel sounds present. Non-tender.  Extremities: No lower extremity edema. Pulses are trace + in the bilateral DP/PT.   Assessment and Plan:   1. TOBACCO ABUSE: She has stopped smoking.   2. Palpitations: No recurrence. Holding beta blocker secondary to fatigue and hair loss which she attributes to metoprolol.   3. CAD: Stable. Continue current meds.   4. CAROTID ARTERY DISEASE : Stable by dopplers today.

## 2012-08-16 ENCOUNTER — Encounter (HOSPITAL_COMMUNITY)
Admission: RE | Admit: 2012-08-16 | Discharge: 2012-08-16 | Disposition: A | Discharge status: Home / Self Care | Payer: Medicare Other | Source: Ambulatory Visit | Attending: Internal Medicine | Admitting: Internal Medicine

## 2012-08-17 ENCOUNTER — Telehealth: Payer: Self-pay | Admitting: Internal Medicine

## 2012-08-17 ENCOUNTER — Other Ambulatory Visit (INDEPENDENT_AMBULATORY_CARE_PROVIDER_SITE_OTHER): Payer: Medicare Other

## 2012-08-17 DIAGNOSIS — I2510 Atherosclerotic heart disease of native coronary artery without angina pectoris: Secondary | ICD-10-CM

## 2012-08-17 DIAGNOSIS — I10 Essential (primary) hypertension: Secondary | ICD-10-CM

## 2012-08-17 LAB — HEPATIC FUNCTION PANEL
ALT: 18 U/L (ref 0–35)
AST: 24 U/L (ref 0–37)
Bilirubin, Direct: 0.1 mg/dL (ref 0.0–0.3)
Total Bilirubin: 0.5 mg/dL (ref 0.3–1.2)

## 2012-08-17 LAB — LIPID PANEL: HDL: 51.7 mg/dL (ref >39.00)

## 2012-08-17 LAB — CBC WITH DIFFERENTIAL/PLATELET
Basophils Relative: 0.5 % (ref 0.0–3.0)
Eosinophils Relative: 5.5 % — ABNORMAL HIGH (ref 0.0–5.0)
HCT: 38.6 % (ref 36.0–46.0)
Hemoglobin: 12.6 g/dL (ref 12.0–15.0)
Lymphs Abs: 2 10*3/uL (ref 0.7–4.0)
Monocytes Relative: 10.1 % (ref 3.0–12.0)
Neutro Abs: 4.3 10*3/uL (ref 1.4–7.7)
RBC: 3.86 Mil/uL — ABNORMAL LOW (ref 3.87–5.11)
WBC: 7.5 10*3/uL (ref 4.5–10.5)

## 2012-08-17 LAB — BASIC METABOLIC PANEL
GFR: 94.41 mL/min (ref >60.00)
Glucose, Bld: 145 mg/dL — ABNORMAL HIGH (ref 70–99)
Potassium: 4.5 mEq/L (ref 3.5–5.1)
Sodium: 141 mEq/L (ref 135–145)

## 2012-08-17 MED ORDER — SULFAMETHOXAZOLE-TMP DS 800-160 MG PO TABS: 1.0000 | ORAL_TABLET | Freq: Two times a day (BID) | ORAL | Status: DC

## 2012-08-17 NOTE — Telephone Encounter (Signed)
CY---pt stated that the tudorza is too expensive and is requesting a sample of this med until after the first of the year or can we change her to something else?  Please advise. Thanks  Last ov--05/24/2012 Next ov---no pending appt  Allergies  Allergen Reactions  . Codeine

## 2012-08-17 NOTE — Telephone Encounter (Signed)
Per CY-okay to give refill on Bactrim as previously given.

## 2012-08-17 NOTE — Telephone Encounter (Signed)
Called and spoke with pt and she is aware that abx has been called to her pharmacy per CY.  Pt is aware and she stated that she will be by on Monday to pick up sample.  Nothing further is needed.

## 2012-08-17 NOTE — Telephone Encounter (Signed)
Called and spoke with pt and she is aware of the sample of the tudorza left up front and for the pt to use 1 spray once daily.  Pt stated that this medication helps her a lot and she is having to pay 95$ a month for this and this is too expensive.  Pt stated that she is getting up some green sputum  Once this week and once last week and is requesting a [facility] the [facility]actrim [facility].  CY please advise. Thanks  Allergies  Allergen Reactions  . Codeine

## 2012-08-17 NOTE — Telephone Encounter (Signed)
Per CY-can give 1 sample and have her use 1 puff once daily-will last longer but need to know if she notices that its not helping her.

## 2012-08-21 ENCOUNTER — Encounter (HOSPITAL_COMMUNITY): Payer: Medicare Other

## 2012-08-23 ENCOUNTER — Telehealth: Payer: Self-pay | Admitting: Internal Medicine

## 2012-08-23 ENCOUNTER — Encounter (HOSPITAL_COMMUNITY)
Admission: RE | Admit: 2012-08-23 | Discharge: 2012-08-23 | Disposition: A | Discharge status: Home / Self Care | Payer: Medicare Other | Source: Ambulatory Visit | Attending: Internal Medicine | Admitting: Internal Medicine

## 2012-08-23 NOTE — Telephone Encounter (Signed)
I spoke with pt and she stated the bactrim caused her to be "nauseated". Pt started it on 08/17/12 and stopped this on 08/19/12. She stated she is feeling better now. She still has mucus but is now clear and no longer green/ she asked I add this to her allergy list. I have done so. Pt stated she does not need another ABX right now but will call if she worsens or coughs up green phlem again. Will forward to Dr. Maple Hudson as an Lorain Childes

## 2012-08-24 NOTE — Telephone Encounter (Signed)
Noted  

## 2012-08-28 ENCOUNTER — Encounter (HOSPITAL_COMMUNITY)
Admission: RE | Admit: 2012-08-28 | Discharge: 2012-08-28 | Disposition: A | Discharge status: Home / Self Care | Payer: Medicare Other | Source: Ambulatory Visit | Attending: Internal Medicine | Admitting: Internal Medicine

## 2012-08-30 ENCOUNTER — Encounter (HOSPITAL_COMMUNITY): Payer: Medicare Other

## 2012-09-04 ENCOUNTER — Encounter (HOSPITAL_COMMUNITY)
Admission: RE | Admit: 2012-09-04 | Discharge: 2012-09-04 | Disposition: A | Discharge status: Home / Self Care | Payer: Medicare Other | Source: Ambulatory Visit | Attending: Internal Medicine | Admitting: Internal Medicine

## 2012-09-04 DIAGNOSIS — J449 Chronic obstructive pulmonary disease, unspecified: Secondary | ICD-10-CM | POA: Insufficient documentation

## 2012-09-04 DIAGNOSIS — J4489 Other specified chronic obstructive pulmonary disease: Secondary | ICD-10-CM | POA: Insufficient documentation

## 2012-09-04 DIAGNOSIS — Z5189 Encounter for other specified aftercare: Secondary | ICD-10-CM | POA: Insufficient documentation

## 2012-09-06 ENCOUNTER — Encounter (HOSPITAL_COMMUNITY)
Admission: RE | Admit: 2012-09-06 | Discharge: 2012-09-06 | Disposition: A | Discharge status: Home / Self Care | Payer: Medicare Other | Source: Ambulatory Visit | Attending: Internal Medicine | Admitting: Internal Medicine

## 2012-09-11 ENCOUNTER — Encounter (HOSPITAL_COMMUNITY)
Admission: RE | Admit: 2012-09-11 | Discharge: 2012-09-11 | Disposition: A | Discharge status: Home / Self Care | Payer: Medicare Other | Source: Ambulatory Visit | Attending: Internal Medicine | Admitting: Internal Medicine

## 2012-09-13 ENCOUNTER — Telehealth: Payer: Self-pay | Admitting: Internal Medicine

## 2012-09-13 ENCOUNTER — Encounter (HOSPITAL_COMMUNITY)
Admission: RE | Admit: 2012-09-13 | Discharge: 2012-09-13 | Disposition: A | Discharge status: Home / Self Care | Payer: Medicare Other | Source: Ambulatory Visit | Attending: Internal Medicine | Admitting: Internal Medicine

## 2012-09-13 MED ORDER — AMOXICILLIN 500 MG PO CAPS: 500.0000 mg | ORAL_CAPSULE | Freq: Three times a day (TID) | ORAL | Status: DC

## 2012-09-13 NOTE — Telephone Encounter (Signed)
Called and spoke with pt and she stated that she has been feeling bad since 11/15.  She stated that she will feel bad then will feel a little better then start feeling bad again.  She stated that she has congestion with no color, coughing at times, hoarseness with raspy voice, no energy, bs today was 140 and BP was good today.  She stated that CY gave her bactrim on 11/15 but she only took 3 doses due to this causing nausea.  CY please advise.  Pt is requesting something to be called in  Last ov--05/24/2012 No pending future appts  Allergies  Allergen Reactions  . Bactrim (Sulfamethoxazole W-Trimethoprim)     nausea  . Codeine

## 2012-09-13 NOTE — Telephone Encounter (Signed)
Per CY-okay to take both [place] and Advair. Pt aware of this.

## 2012-09-13 NOTE — Telephone Encounter (Signed)
Per CY, send in to patients pharmacy...  Amox 500mg  #21  Take 1 tid x 7 days.  x0 refills [facility] [address]

## 2012-09-13 NOTE — Telephone Encounter (Signed)
Called and spoke with pt and she is aware of the amoxicillin that has been sent to the pharmacy.  Pt wanted to make sure that she is to be taking the tudorza and the advair.  She stated that she was never told to stop the advair.  CY please advise. thanks

## 2012-09-18 ENCOUNTER — Encounter (HOSPITAL_COMMUNITY)
Admission: RE | Admit: 2012-09-18 | Discharge: 2012-09-18 | Disposition: A | Discharge status: Home / Self Care | Payer: Medicare Other | Source: Ambulatory Visit | Attending: Internal Medicine | Admitting: Internal Medicine

## 2012-09-20 ENCOUNTER — Encounter (HOSPITAL_COMMUNITY)
Admission: RE | Admit: 2012-09-20 | Discharge: 2012-09-20 | Disposition: A | Discharge status: Home / Self Care | Payer: Medicare Other | Source: Ambulatory Visit | Attending: Internal Medicine | Admitting: Internal Medicine

## 2012-09-25 ENCOUNTER — Encounter (HOSPITAL_COMMUNITY): Payer: Medicare Other

## 2012-09-25 ENCOUNTER — Encounter (HOSPITAL_COMMUNITY)
Admission: RE | Admit: 2012-09-25 | Discharge: 2012-09-25 | Disposition: A | Discharge status: Home / Self Care | Payer: Medicare Other | Source: Ambulatory Visit | Attending: Internal Medicine | Admitting: Internal Medicine

## 2012-09-27 ENCOUNTER — Encounter (HOSPITAL_COMMUNITY): Payer: Medicare Other

## 2012-09-27 ENCOUNTER — Encounter (HOSPITAL_COMMUNITY)
Admission: RE | Admit: 2012-09-27 | Discharge: 2012-09-27 | Disposition: A | Discharge status: Home / Self Care | Payer: Medicare Other | Source: Ambulatory Visit | Attending: Internal Medicine | Admitting: Internal Medicine

## 2012-10-02 ENCOUNTER — Encounter (HOSPITAL_COMMUNITY)
Admission: RE | Admit: 2012-10-02 | Discharge: 2012-10-02 | Disposition: A | Discharge status: Home / Self Care | Payer: Medicare Other | Source: Ambulatory Visit | Attending: Internal Medicine | Admitting: Internal Medicine

## 2012-10-04 ENCOUNTER — Encounter (HOSPITAL_COMMUNITY)
Admission: RE | Admit: 2012-10-04 | Discharge: 2012-10-04 | Disposition: A | Discharge status: Home / Self Care | Payer: Medicare Other | Source: Ambulatory Visit | Attending: Internal Medicine | Admitting: Internal Medicine

## 2012-10-04 DIAGNOSIS — J449 Chronic obstructive pulmonary disease, unspecified: Secondary | ICD-10-CM | POA: Insufficient documentation

## 2012-10-04 DIAGNOSIS — J4489 Other specified chronic obstructive pulmonary disease: Secondary | ICD-10-CM | POA: Insufficient documentation

## 2012-10-04 DIAGNOSIS — Z5189 Encounter for other specified aftercare: Secondary | ICD-10-CM | POA: Insufficient documentation

## 2012-10-09 ENCOUNTER — Encounter (HOSPITAL_COMMUNITY): Payer: Medicare Other

## 2012-10-11 ENCOUNTER — Encounter (HOSPITAL_COMMUNITY)
Admission: RE | Admit: 2012-10-11 | Discharge: 2012-10-11 | Disposition: A | Discharge status: Home / Self Care | Payer: Medicare Other | Source: Ambulatory Visit | Attending: Internal Medicine | Admitting: Internal Medicine

## 2012-10-16 ENCOUNTER — Encounter (HOSPITAL_COMMUNITY)
Admission: RE | Admit: 2012-10-16 | Discharge: 2012-10-16 | Disposition: A | Discharge status: Home / Self Care | Payer: Medicare Other | Source: Ambulatory Visit | Attending: Internal Medicine | Admitting: Internal Medicine

## 2012-10-18 ENCOUNTER — Encounter (HOSPITAL_COMMUNITY)
Admission: RE | Admit: 2012-10-18 | Discharge: 2012-10-18 | Disposition: A | Discharge status: Home / Self Care | Payer: Medicare Other | Source: Ambulatory Visit | Attending: Internal Medicine | Admitting: Internal Medicine

## 2012-10-23 ENCOUNTER — Encounter (HOSPITAL_COMMUNITY)
Admission: RE | Admit: 2012-10-23 | Discharge: 2012-10-23 | Disposition: A | Discharge status: Home / Self Care | Payer: Medicare Other | Source: Ambulatory Visit | Attending: Internal Medicine | Admitting: Internal Medicine

## 2012-10-25 ENCOUNTER — Encounter (HOSPITAL_COMMUNITY)
Admission: RE | Admit: 2012-10-25 | Discharge: 2012-10-25 | Disposition: A | Discharge status: Home / Self Care | Payer: Medicare Other | Source: Ambulatory Visit | Attending: Internal Medicine | Admitting: Internal Medicine

## 2012-10-30 ENCOUNTER — Encounter (HOSPITAL_COMMUNITY): Payer: Medicare Other

## 2012-11-01 ENCOUNTER — Encounter (HOSPITAL_COMMUNITY)
Admission: RE | Admit: 2012-11-01 | Discharge: 2012-11-01 | Disposition: A | Discharge status: Home / Self Care | Payer: Medicare Other | Source: Ambulatory Visit | Attending: Internal Medicine | Admitting: Internal Medicine

## 2012-11-06 ENCOUNTER — Encounter (HOSPITAL_COMMUNITY)
Admission: RE | Admit: 2012-11-06 | Discharge: 2012-11-06 | Disposition: A | Discharge status: Home / Self Care | Payer: Medicare Other | Source: Ambulatory Visit | Attending: Internal Medicine | Admitting: Internal Medicine

## 2012-11-06 DIAGNOSIS — J4489 Other specified chronic obstructive pulmonary disease: Secondary | ICD-10-CM | POA: Insufficient documentation

## 2012-11-06 DIAGNOSIS — J449 Chronic obstructive pulmonary disease, unspecified: Secondary | ICD-10-CM | POA: Insufficient documentation

## 2012-11-06 DIAGNOSIS — Z5189 Encounter for other specified aftercare: Secondary | ICD-10-CM | POA: Insufficient documentation

## 2012-11-08 ENCOUNTER — Encounter (HOSPITAL_COMMUNITY)
Admission: RE | Admit: 2012-11-08 | Discharge: 2012-11-08 | Disposition: A | Discharge status: Home / Self Care | Payer: Medicare Other | Source: Ambulatory Visit | Attending: Internal Medicine | Admitting: Internal Medicine

## 2012-11-13 ENCOUNTER — Encounter (HOSPITAL_COMMUNITY): Payer: Medicare Other

## 2012-11-15 ENCOUNTER — Encounter (HOSPITAL_COMMUNITY): Payer: Medicare Other

## 2012-12-14 NOTE — Progress Notes (Addendum)
Pt wt up 1.1 kg during rehab.  Wt loss not desired. BMI 23.3, % body fat 32%. Pt with 1%  increase in % body fat. Section Completed by: Mickle Plumb, M.Ed, RD, LDN, CDE  12/14/2012 10:51 AM                            Pulmonary Rehabilitation Program Outcomes Report   Orientation:  08/01/2012 Graduate Date:  11/08/2012  # of sessions completed: 24  Pulmonologist: Young Class Time: 1:30  A.  Exercise Program:  Tolerates exercise @ 2.0 METS for 45 minutes, Walk Test Results:  Pre: 800 ft and Post: 680 ft, Decreased functional capacity  15.00 %, No Change  muscular strength  0 %, Improved dyspnea score 1.25 %, Improved education score 66.67 %, Exercise limited by dyspnea, Needs encouragement on exercise program, Discharged to home exercise program.  Anticipated compliance:  fair and Discharged  B.  Mental Health:  Health related anxiety and Quality of Life (QOL)  improvements:  Overall  13.28 %, Health/Functioning 17.57 %, Socioeconomics 20.48 %, Psych/Spiritual 0.56 %, Family 13.64 %    C.  Education/Instruction/Skills  Uses Perceived Exertion Scale and/or Dyspnea Scale and Attended 6 education classes  Demonstrates accurate diaphragmatic breathing and pursed lip breathing. Home exercise given on 08-09-2012   D.  Blood Lipids    Lab Results  Component Value Date   CHOL 149 08/17/2012   HDL 51.70 08/17/2012   LDLDIRECT 67.3 08/17/2012   TRIG 227.0* 08/17/2012   CHOLHDL 3 08/17/2012    E.  Lifestyle Changes:  Making positive lifestyle changes and Continues to smoke  F.  Symptoms noted with exercise:  Angina at rest, Questionable angina, Shortness of breath, Dizziness, Nausea, Fatigue, Hypoglycemic, Resting hypertension and Exertional hypertension  Report Completed By:  Frederik Schmidt. Tamecia Mcdougald, MS, NASM, CES

## 2013-02-01 ENCOUNTER — Encounter: Payer: Self-pay | Admitting: Internal Medicine

## 2013-02-01 ENCOUNTER — Ambulatory Visit (INDEPENDENT_AMBULATORY_CARE_PROVIDER_SITE_OTHER): Payer: Medicare Other | Admitting: Internal Medicine

## 2013-02-01 VITALS — BP 110/70 | HR 82 | Ht 61.0 in | Wt 126.2 lb

## 2013-02-01 DIAGNOSIS — J9610 Chronic respiratory failure, unspecified whether with hypoxia or hypercapnia: Secondary | ICD-10-CM

## 2013-02-01 DIAGNOSIS — J449 Chronic obstructive pulmonary disease, unspecified: Secondary | ICD-10-CM

## 2013-02-01 NOTE — Patient Instructions (Addendum)
Order - [facility]ME [facility]- add humidifer to home O2 concentrator   Dx COPD, chronic hypoxic respiratory failure                                         May use portable o2 concentrator 3-4 L/ pulse for travel  Goal oximeter range is 90-94%  Please call as needed

## 2013-02-01 NOTE — Progress Notes (Signed)
Patient ID: [name], female    DOB: [birth year], 72 y.o.   MRN: [id number]  HPI 03/21/11- 80 yoF followed for COPD, ongoing tobacco use, complicated by CAD Last here Nov 18, 2010- note reviewed.  Continues in a Spiriva drug trial CXR 01/25/11 - stable COPD, NAD She continues to find that sleeping with oxygen makes her feel much better. Continues Advair and Spiriva regularly. Rare need for rescue inhaler.  07/08/11 62 yoF followed for COPD, ongoing tobacco use, complicated by CAD, DM   Husband and daughter here Acute visit- Dyspnea   She complains of feeling somehow bad with more short of breath particularly in the last 2-3 weeks. Her husband says she has been steadily declining, weaker, for 4 or 5 months. She complains of pain in her left bicep if she tries to reach around to her back but this is not exertional pain otherwise and she denies angina-type pain or pleuritic pain. Mainly she is complaining of shortness of breath. There is some increased cough with scant clear mucus. She gets a burning sensation in the upper sternum just in the last few days. Some nausea last week. She was seen at her primary care office 4 days ago , given a Rocephin injection and started on Levaquin. Chest x-ray was done and she was told it was clear. She denies swollen glands, rash, nausea or vomiting, change in bowel or bladder , swelling or aching in legs. EKG- RSR, RAE, NAD  07/15/11-69 yoF followed for COPD, ongoing tobacco use, complicated by CAD, DM..........Marland Kitchenhusband here Much stronger and feeling much better than last time. Has reduced but not quit smoking-discussed. Notes desat walking through home on room air to as low as 83%. Discussed use of O2 for goal sat >/= 88% and discussed CO2 retention. Coughs up a little thick clear mucus.   10/31/11- 82 yoF followed for COPD, ongoing tobacco use, complicated by CAD, DM..........Marland Kitchenhusband here In past week just gotten over the breathing concerns since seen last  time and in December (when had flu like symptoms); Has been using O2 more-having low O2 levels when not on O2.(low as 75%). Unfortunately she has continued to smoke a few cigarettes daily against advice. She has been dealing with a sustained exacerbation of COPD really since October. Using oxygen continuously between 2 and 3 L per minute/[facility]. We talked about pulmonary rehabilitation. She never took the invitation to try the [facility] program. She said then that she would [facility] but she didn't do that either. She paces herself at home wearing oxygen, to do limited housework. She can move a vacuum cleaner but not move furniture. Bothersome pain in the left shoulder with decreased range of motion. Little cough, phlegm, exertional chest pain, edema. No blood. PFT-09/06/2010-reviewed again with her and her husband. Very severe obstructive airways disease, emphysema pattern, FEV1/FVC 0.28, DLCO 42%. She has a home nebulizer machine with albuterol and that works better for her than metered-dose inhalers.  01/30/12- 78 yoF followed for COPD, ongoing tobacco use, complicated by CAD, DM..........Marland Kitchenhusband here  Pt states breathing is worsening, increased sob. Pt is wearing O2 24/7 at 2-3LPM, pt states she is trying to quit smoking and is currently using the electronic cigarrette rather than the tobacco. She is worrking toward quitting. Pt states mucus is increased, green-yellow phlegm Now with 2 days of green sputum and watery rhinorrhea without sore throat or fever. She restarted Spiriva   CXR 07/04/11- IMPRESSION:  COPD. No  active lung disease.  Original Report Authenticated By: Juline Patch, M.D.    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 Post hospital May 16 through May 18 for exacerbation of COPD. Daughter here  today. Has finally quit smoking. Shows me some green tinged mucus, not frankly purulent. Registers a CAT score of 11.  05/24/12- 70 yoF followed for COPD, ongoing tobacco use, complicated by CAD, DM..........Marland Kitchenhusband here Has noticed somedays coughing and able to cough something up-green in color but not often; having more "hot flashes" but unware of cause. SOB with activity. Describes occasional "white bumps" on her feet.  02/01/13- 70 yoF followed for COPD, ongoing tobacco use, complicated by CAD, DM..........Marland Kitchenhusband here FOLLOWS FOR: patient states she has good days and bad days w her breahting, has had some green mucus throughout the day,decreased energy level x1week . has increased o2 to 3L at all times x3weeks--off tudorza x1 wk (ran out) Home O2 3 L with activity, 2.5 L at rest/[facility]. Quit smoking one year ago CXR 8/27/ 13 IMPRESSION:  COPD with hyperinflation. No acute cardiopulmonary disease.  Original Report Authenticated By: Camelia Phenes, M.D.  Review of Systems- see HPI Constitutional:   No-   weight loss, +night sweats, fevers, chills,+ fatigue, lassitude. HEENT:   No-  headaches, difficulty swallowing, tooth/dental problems, sore throat,       No-  sneezing, itching, ear ache, nasal congestion, post nasal drip,  CV:  No-   chest pain, orthopnea, PND, +swelling in lower extremities, dizziness, palpitations Resp: +  shortness of breath with exertion or at rest.              No- productive cough,  + non-productive cough,  No-  coughing up of blood.              Occ +  change in color of mucus.  No- wheezing.   Skin: No-   rash or lesions. GI:  No-   heartburn, indigestion, abdominal pain, nausea, vomiting,  GU:  MS:  No-   joint pain or swelling.   Neuro- nothing unusual Psych:  No- change in mood or affect. No depression or anxiety.  No memory loss.  OBJ General- Alert, Oriented, Much stronger.  O2 2L sat 92% Skin- rash-none, lesions- none, excoriation-  none Lymphadenopathy- none Head- atraumatic            Eyes- Gross vision intact, PERRLA, conjunctivae clear secretions            Ears- Hearing, canals- normal            Nose- Clear, no-Septal dev, mucus, polyps, erosion, perforation             Throat- Mallampati II , mucosa clear , drainage- none, tonsils- atrophic Neck- flexible , trachea midline, no stridor , thyroid nl, carotid no bruit Chest - symmetrical excursion , unlabored           Heart/CV- RRR , no murmur , no gallop  , no rub, nl s1 s2                           - JVD- none , edema- none, stasis changes- none, varices- none. Good capillary filling.           Lung- +very distant, wheeze-none, cough+ dry ,  dullness-none, rub- none           Chest wall-  Abd-  Br/ Gen/ Rectal- Not done, not indicated Extrem- cyanosis- none, clubbing, none, atrophy- none, strength- nl Neuro- grossly intact to observation

## 2013-02-12 NOTE — Assessment & Plan Note (Signed)
Slow decline with advancing age. No acute event. Plan-discussed portable concentrators for travel, humidifier for home concentrator

## 2013-03-13 ENCOUNTER — Encounter: Payer: Self-pay | Admitting: Cardiovascular Disease

## 2013-03-13 ENCOUNTER — Encounter (INDEPENDENT_AMBULATORY_CARE_PROVIDER_SITE_OTHER): Payer: Medicare Other

## 2013-03-13 ENCOUNTER — Ambulatory Visit (INDEPENDENT_AMBULATORY_CARE_PROVIDER_SITE_OTHER): Payer: Medicare Other | Admitting: Cardiovascular Disease

## 2013-03-13 VITALS — BP 128/70 | HR 86 | Ht 61.0 in | Wt 127.6 lb

## 2013-03-13 DIAGNOSIS — I779 Disorder of arteries and arterioles, unspecified: Secondary | ICD-10-CM

## 2013-03-13 DIAGNOSIS — I6529 Occlusion and stenosis of unspecified carotid artery: Secondary | ICD-10-CM

## 2013-03-13 DIAGNOSIS — I2510 Atherosclerotic heart disease of native coronary artery without angina pectoris: Secondary | ICD-10-CM

## 2013-03-13 NOTE — Progress Notes (Signed)
History of Present Illness: 72 yo WF with prior history of HTN, HLD, COPD and CAD status post stenting of right coronary artery in 2005 and LAD in 2006 here for cardiac follow up. She has been followed in the past by Dr. Gala Romney. She underwent cardiac catheterization on July 09, 2010 due to CP, showed patent stents to the LAD and RCA with nonobstructive disease throughout. It was felt the majority of her symptoms were probably due to COPD and she was counseled to stop smoking. She was sent home on Imdur. The patient said she became extremely dizzy after she took one dose and had to stop it. Referred to pulmonary. Saw Dr. Maple Hudson. PFTs with severe COPD.   She is here today for follow up. She has no chest pains. Her breathing is at baseline. She wears supplemental oxygen No significant CP. No symptoms of heart failure. No change in weight. No palpitations. Energy level is better off of metoprolol. She does describe aching in arms and legs.   Primary Care Physician: Andi Devon   Last Lipid Profile:Lipid Panel     Component Value Date/Time   CHOL 149 08/17/2012 1059   TRIG 227.0* 08/17/2012 1059   HDL 51.70 08/17/2012 1059   CHOLHDL 3 08/17/2012 1059   VLDL 45.4* 08/17/2012 1059     Past Medical History  Diagnosis Date  . CAD (coronary artery disease)     Stent RCA 2005, stent LAD 2006  . Hypertension   . Hyperlipemia   . COPD (chronic obstructive pulmonary disease)   . Tobacco abuse   . Fatigue   . Shortness of breath   . Myocardial infarction   . Heart murmur   . Recurrent upper respiratory infection (URI)   . Depression   . Carotid artery occlusion   . Diabetes mellitus without complication     Past Surgical History  Procedure Laterality Date  . Coronary stent placement      drug-eluting; right coronary artery  . Vesicovaginal fistula closure w/ tah    . Coronary angiography  Nov 25, 2004    CYPHER stenting, left anterior descending  . Cardiac catheterization   2011    Current Outpatient Prescriptions  Medication Sig Dispense Refill  . Aclidinium Bromide 400 MCG/ACT AEPB Inhale into the lungs.      Marland Kitchen albuterol (PROAIR HFA) 108 (90 BASE) MCG/ACT inhaler Inhale 2 puffs into the lungs 4 (four) times daily as needed. Rescue inhaler      . albuterol (PROVENTIL) (2.5 MG/3ML) 0.083% nebulizer solution Take 2.5 mg by nebulization every 6 (six) hours as needed for wheezing.      . Ascorbic Acid (VITAMIN C) 1000 MG tablet Take 1,000 mg by mouth daily.      Marland Kitchen aspirin EC 81 MG tablet Take 81 mg by mouth daily.      . calcium citrate-vitamin D 200-200 MG-UNIT TABS Take 1 tablet by mouth daily.      Marland Kitchen CINNAMON PO Take 2,000 tablets by mouth daily.      . clonazePAM (KLONOPIN) 1 MG tablet Take 1 mg by mouth 2 (two) times daily as needed. For anxiety.      . conjugated estrogens (PREMARIN) vaginal cream Place vaginally as directed.      . ezetimibe-simvastatin (VYTORIN) 10-40 MG per tablet Take 1 tablet by mouth at bedtime.      . Fluticasone-Salmeterol (ADVAIR) 250-50 MCG/DOSE AEPB Inhale 1 puff into the lungs every 12 (twelve) hours.      Marland Kitchen  metFORMIN (GLUCOPHAGE-XR) 500 MG 24 hr tablet Take 500 mg by mouth daily with breakfast.      . Multiple Vitamin (MULTIVITAMIN) tablet Take 1 tablet by mouth daily.      . nitroGLYCERIN (NITROSTAT) 0.4 MG SL tablet Place 1 tablet (0.4 mg total) under the tongue every 5 (five) minutes as needed for chest pain.  25 tablet  6  . SPIRIVA HANDIHALER 18 MCG inhalation capsule As directed      . Aclidinium Bromide 400 MCG/ACT AEPB Inhale 1 Device into the lungs 2 (two) times daily. PT CONTINUES TO TAKE THIS MED       No current facility-administered medications for this visit.    Allergies  Allergen Reactions  . Bactrim (Sulfamethoxazole W-Trimethoprim)     nausea  . Codeine     History   Social History  . Marital Status: Married    Spouse Name: N/A    Number of Children: 3  . Years of Education: N/A   Occupational  History  . Retired Journalist, newspaper   Social History Main Topics  . Smoking status: Former Smoker -- 0.25 packs/day for 50 years    Types: Cigarettes    Quit date: 02/15/2012  . Smokeless tobacco: Never Used     Comment: started smoking at age 51/// Pt is currently trying to quit smoking, using the electronic cigs, not longer smoking tobacco  . Alcohol Use: Yes     Comment: occasional wine  . Drug Use: No  . Sexually Active: Not Currently    Birth Control/ Protection: Post-menopausal   Other Topics Concern  . Not on file   Social History Narrative   Husband smokes cigars    Family History  Problem Relation Age of Onset  . Diabetes Mother     deceased  . Heart disease Mother   . Alzheimer's disease Father     deceased  . Diabetes Sister   . Heart attack Sister     Review of Systems:  As stated in the HPI and otherwise negative.   BP 128/70  Pulse 86  Ht 5\' 1"  (1.549 m)  Wt 127 lb 9.6 oz (57.879 kg)  BMI 24.12 kg/m2  Physical Examination: General: Well developed, well nourished, NAD HEENT: OP clear, mucus membranes moist SKIN: warm, dry. No rashes. Neuro: No focal deficits Musculoskeletal: Muscle strength 5/5 all ext Psychiatric: Mood and affect normal Neck: No JVD, no carotid bruits, no thyromegaly, no lymphadenopathy. Lungs:Clear bilaterally, no wheezes, rhonci, crackles Cardiovascular: Regular rate and rhythm. No murmurs, gallops or rubs. Abdomen:Soft. Bowel sounds present. Non-tender.  Extremities: No lower extremity edema. Pulses are 2 + in the bilateral DP/PT.  EKG: NSR, rate 82 bpm.   Assessment and Plan:   1. TOBACCO ABUSE: She has stopped smoking.   2. Palpitations: No recurrence. Holding beta blocker secondary to fatigue.   3. CAD: Stable. Continue current meds.   4. CAROTID ARTERY DISEASE : Stable by dopplers today.  5. Muscle aches: ? Secondary to statin. Will hold Vytorin for one month then call if there is resolution of  symptoms.

## 2013-03-13 NOTE — Patient Instructions (Addendum)
Your physician wants you to follow-up in:  6 months.  You will receive a reminder letter in the mail two months in advance. If you don't receive a letter, please call our office to schedule the follow-up appointment.  Your physician has recommended you make the following change in your medication: Stop taking Vytorin for one month to see if this helps with aches.  Call and let us know how you are doing in a month.

## 2013-04-25 ENCOUNTER — Telehealth: Payer: Self-pay | Admitting: Cardiovascular Disease

## 2013-04-25 NOTE — Telephone Encounter (Signed)
New Prob    Pt is now off VYTORIN per Dr. Clifton James and states she isnt hurting and has more energy. Would like to know how to move forward. Please call.

## 2013-04-25 NOTE — Telephone Encounter (Signed)
Pt told dr/nurse out till next week and will receive a call then. Pt accepting of plan.

## 2013-04-29 NOTE — Telephone Encounter (Signed)
She can just stop taking any statins for now. When I see her in follow up we can discuss other options. Thanks, chris

## 2013-04-29 NOTE — Telephone Encounter (Signed)
I called to inform her of Dr. Gibson Ramp advice.  Patient verbalized understanding and was advised to look for note from our office in October for a December appointment.

## 2013-05-28 ENCOUNTER — Other Ambulatory Visit (HOSPITAL_COMMUNITY): Payer: Self-pay | Admitting: Internal Medicine

## 2013-05-28 DIAGNOSIS — Z1231 Encounter for screening mammogram for malignant neoplasm of breast: Secondary | ICD-10-CM

## 2013-06-05 ENCOUNTER — Ambulatory Visit (HOSPITAL_COMMUNITY)
Admission: RE | Admit: 2013-06-05 | Discharge: 2013-06-05 | Disposition: A | Discharge status: Home / Self Care | Payer: Medicare Other | Source: Ambulatory Visit | Attending: Internal Medicine | Admitting: Internal Medicine

## 2013-06-05 DIAGNOSIS — Z1231 Encounter for screening mammogram for malignant neoplasm of breast: Secondary | ICD-10-CM

## 2013-07-24 ENCOUNTER — Telehealth: Payer: Self-pay | Admitting: Internal Medicine

## 2013-07-24 NOTE — Telephone Encounter (Signed)
I spoke with pt. She reports she is needing the flu shot. [facility] and this is FYI for [place].

## 2013-07-24 NOTE — Telephone Encounter (Signed)
Noted- we can document for the record

## 2013-08-16 ENCOUNTER — Telehealth: Payer: Self-pay | Admitting: Internal Medicine

## 2013-08-16 DIAGNOSIS — J449 Chronic obstructive pulmonary disease, unspecified: Secondary | ICD-10-CM

## 2013-08-16 NOTE — Telephone Encounter (Signed)
I spoke with pt. She reports this is correct and is asking for RX to be sent. Please advise Dr. Maple Hudson

## 2013-08-16 NOTE — Telephone Encounter (Signed)
Called [employer], spoke with Kingsley Plan.  Santina Evans is on another call.  Kingsley Plan will ask her to call back.

## 2013-08-16 NOTE — Telephone Encounter (Signed)
Pt would like to know when this will be taken of & asked to be reached at [phone] or [phone].  [name]

## 2013-08-16 NOTE — Telephone Encounter (Signed)
Spoke with Santina Evans with [facility].   Reports pt is wanting an extra travel concentrator.   Pt is purchasing this out of pocket. Per Santina Evans, they will need an order faxed to them just stating that pt is prescribed o2. This order will need to be faxed to [phone]. I have lmomtcb for pt to verify this with her.

## 2013-08-18 NOTE — Telephone Encounter (Signed)
Ok to send confirmation that patient is prescribed oxygen for COPD

## 2013-08-19 ENCOUNTER — Telehealth: Payer: Self-pay | Admitting: Internal Medicine

## 2013-08-19 NOTE — Telephone Encounter (Signed)
Order has been sent

## 2013-08-19 NOTE — Telephone Encounter (Signed)
I spoke with pt. She reprots we were to be faxing [facility] she is on O2. I advised her we faxed this over to them this AM. Nothing further needed

## 2013-08-19 NOTE — Telephone Encounter (Signed)
[name] from [facility] & asks this to be faxed to local# instead of [phone] l;eft previously.   Fax #s [phone] & [phone].  [name] can be reached at [phone] for any questions.  [name]

## 2013-08-19 NOTE — Telephone Encounter (Signed)
Please advise PCC;s thanks 

## 2013-08-19 NOTE — Telephone Encounter (Signed)
Confirmation received that [facility] received the referral faxed to # [phone]. Rhonda J Cobb

## 2013-08-19 NOTE — Telephone Encounter (Signed)
Referral has been faxed to [facility] at [phone] and [phone]. [name]

## 2013-08-19 NOTE — Telephone Encounter (Signed)
I spoke with pt and is aware. Nothing further needed 

## 2013-08-20 ENCOUNTER — Encounter: Payer: Self-pay | Admitting: Internal Medicine

## 2013-08-20 ENCOUNTER — Ambulatory Visit (INDEPENDENT_AMBULATORY_CARE_PROVIDER_SITE_OTHER)
Admission: RE | Admit: 2013-08-20 | Discharge: 2013-08-20 | Disposition: A | Discharge status: Home / Self Care | Payer: Medicare Other | Source: Ambulatory Visit | Attending: Internal Medicine | Admitting: Internal Medicine

## 2013-08-20 ENCOUNTER — Other Ambulatory Visit: Payer: Medicare Other

## 2013-08-20 ENCOUNTER — Ambulatory Visit (INDEPENDENT_AMBULATORY_CARE_PROVIDER_SITE_OTHER): Payer: Medicare Other | Admitting: Internal Medicine

## 2013-08-20 VITALS — BP 118/60 | HR 88 | Ht 61.0 in | Wt 132.8 lb

## 2013-08-20 DIAGNOSIS — J439 Emphysema, unspecified: Secondary | ICD-10-CM

## 2013-08-20 DIAGNOSIS — J438 Other emphysema: Secondary | ICD-10-CM

## 2013-08-20 DIAGNOSIS — Z23 Encounter for immunization: Secondary | ICD-10-CM

## 2013-08-20 NOTE — Patient Instructions (Addendum)
Order - lab alpha 1 antitrypsin level    Dx COPD with emphysema  Order- CXR-    Dx COPD with emphysema  TDAP  Pneumonia vaccine conjugate 13   Stop Spiriva since you are using Tudorza and can get is cheaper

## 2013-08-20 NOTE — Progress Notes (Signed)
Patient ID: [name], female    DOB: [birth year], 72 y.o.   MRN: [id number]  HPI 03/21/11- 72 yoF followed for COPD, ongoing tobacco use, complicated by CAD Last here Nov 18, 2010- note reviewed.  Continues in a Spiriva drug trial CXR 01/25/11 - stable COPD, NAD She continues to find that sleeping with oxygen makes her feel much better. Continues Advair and Spiriva regularly. Rare need for rescue inhaler.  07/08/11 72 yoF followed for COPD, ongoing tobacco use, complicated by CAD, DM   Husband and daughter here Acute visit- Dyspnea   She complains of feeling somehow bad with more short of breath particularly in the last 2-3 weeks. Her husband says she has been steadily declining, weaker, for 4 or 5 months. She complains of pain in her left bicep if she tries to reach around to her back but this is not exertional pain otherwise and she denies angina-type pain or pleuritic pain. Mainly she is complaining of shortness of breath. There is some increased cough with scant clear mucus. She gets a burning sensation in the upper sternum just in the last few days. Some nausea last week. She was seen at her primary care office 4 days ago , given a Rocephin injection and started on Levaquin. Chest x-ray was done and she was told it was clear. She denies swollen glands, rash, nausea or vomiting, change in bowel or bladder , swelling or aching in legs. EKG- RSR, RAE, NAD  07/15/11-72 yoF followed for COPD, ongoing tobacco use, complicated by CAD, DM..........Marland Kitchenhusband here Much stronger and feeling much better than last time. Has reduced but not quit smoking-discussed. Notes desat walking through home on room air to as low as 83%. Discussed use of O2 for goal sat >/= 88% and discussed CO2 retention. Coughs up a little thick clear mucus.   10/31/11- 72 yoF followed for COPD, ongoing tobacco use, complicated by CAD, DM..........Marland Kitchenhusband here In past week just gotten over the breathing concerns since seen last  time and in December (when had flu like symptoms); Has been using O2 more-having low O2 levels when not on O2.(low as 75%). Unfortunately she has continued to smoke a few cigarettes daily against advice. She has been dealing with a sustained exacerbation of COPD really since October. Using oxygen continuously between 2 and 3 L per minute/Advanced. We talked about pulmonary rehabilitation. She never took the invitation to try the [facility] program. She said [facility] that [facility] but she [facility]'t [facility]. She paces herself at home wearing oxygen, to do limited housework. She can move a vacuum cleaner but not move furniture. Bothersome pain in the left shoulder with decreased range of motion. Little cough, phlegm, exertional chest pain, edema. No blood. PFT-09/06/2010-reviewed again with her and her husband. Very severe obstructive airways disease, emphysema pattern, FEV1/FVC 0.28, DLCO 42%. She has a home nebulizer machine with albuterol and that works better for her than metered-dose inhalers.  01/30/12- 72 yoF followed for COPD, ongoing tobacco use, complicated by CAD, DM..........Marland Kitchenhusband here  Pt states breathing is worsening, increased sob. Pt is wearing O2 24/7 at 2-3LPM, pt states she is trying to quit smoking and is currently using the electronic cigarrette rather than the tobacco. She is worrking toward quitting. Pt states mucus is increased, green-yellow phlegm Now with 2 days of green sputum and watery rhinorrhea without sore throat or fever. She restarted Spiriva   CXR 07/04/11- IMPRESSION:  COPD. No  active lung disease.  Original Report Authenticated By: Juline Patch, M.D.    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 Post hospital May 16 through May 18 for exacerbation of COPD. Daughter here  today. Has finally quit smoking. Shows me some green tinged mucus, not frankly purulent. Registers a CAT score of 11.  05/24/12- 70 yoF followed for COPD, ongoing tobacco use, complicated by CAD, DM..........Marland Kitchenhusband here Has noticed somedays coughing and able to cough something up-green in color but not often; having more "hot flashes" but unware of cause. SOB with activity. Describes occasional "white bumps" on her feet.  02/01/13- 70 yoF followed for COPD, ongoing tobacco use, complicated by CAD, DM..........Marland Kitchenhusband here FOLLOWS FOR: patient states she has good days and bad days w her breahting, has had some green mucus throughout the day,decreased energy level x1week . has increased o2 to 3L at all times x3weeks--off tudorza x1 wk (ran out) Home O2 3 L with activity, 2.5 L at rest/Advanced. Quit smoking one year ago CXR 8/27/ 13 IMPRESSION:  COPD with hyperinflation. No acute cardiopulmonary disease.  Original Report Authenticated By: Camelia Phenes, M.D.  08/20/13- 72 yoF former smoker followed for COPD,  complicated by CAD, DM..........Marland Kitchenhusband here Home O2 3 L  at rest/Advanced. FOLLOWS FOR: Pt states she has been doing well since last visit; has not been sick either. She feels better-noticing that she was able to walk in from parking lot through cold air by herself. Waiting for portable concentrator. Brother has died of COPD  Review of Systems- see HPI Constitutional:   No-   weight loss, +night sweats, fevers, chills,+ fatigue, lassitude. HEENT:   No-  headaches, difficulty swallowing, tooth/dental problems, sore throat,       No-  sneezing, itching, ear ache, nasal congestion, post nasal drip,  CV:  No-   chest pain, orthopnea, PND, +swelling in lower extremities, dizziness, palpitations Resp: +  shortness of breath with exertion or at rest.              No- productive cough,  + non-productive cough,  No-  coughing up of blood.              Occ +  change in color of mucus.  No-  wheezing.   Skin: No-   rash or lesions. GI:  No-   heartburn, indigestion, abdominal pain, nausea, vomiting,  GU:  MS:  No-   joint pain or swelling.   Neuro- nothing unusual Psych:  No- change in mood or affect. No depression or anxiety.  No memory loss.  OBJ General- Alert, Oriented, Much stronger.  O2 3L sat 91% Skin- rash-none, lesions- none, excoriation- none Lymphadenopathy- none Head- atraumatic            Eyes- Gross vision intact, PERRLA, conjunctivae clear secretions            Ears- Hearing, canals- normal            Nose- Clear, no-Septal dev, mucus, polyps, erosion, perforation             Throat- Mallampati II , mucosa clear , drainage- none, tonsils- atrophic Neck- flexible , trachea midline, no stridor , thyroid nl, carotid no bruit Chest - symmetrical excursion , unlabored           Heart/CV- RRR , no murmur ,  no gallop  , no rub, nl s1 s2                           - JVD- none , edema- none, stasis changes- none, varices- none. Good capillary filling.           Lung- +very distant, wheeze-none, cough+ dry , dullness-none, rub- none           Chest wall-  Abd-  Br/ Gen/ Rectal- Not done, not indicated Extrem- cyanosis- none, clubbing, none, atrophy- none, strength- nl Neuro- grossly intact to observation

## 2013-08-21 NOTE — Progress Notes (Signed)
Quick Note:  Pt aware of results. ______ 

## 2013-08-24 LAB — ALPHA-1 ANTITRYPSIN PHENOTYPE: A-1 Antitrypsin: 113 mg/dL (ref 83–199)

## 2013-09-18 ENCOUNTER — Encounter: Payer: Self-pay | Admitting: Cardiology

## 2013-09-18 ENCOUNTER — Ambulatory Visit (HOSPITAL_COMMUNITY): Payer: Medicare Other | Attending: Cardiology

## 2013-09-18 ENCOUNTER — Encounter: Payer: Self-pay | Admitting: Cardiovascular Disease

## 2013-09-18 ENCOUNTER — Ambulatory Visit (INDEPENDENT_AMBULATORY_CARE_PROVIDER_SITE_OTHER): Payer: Medicare Other | Admitting: Cardiovascular Disease

## 2013-09-18 VITALS — BP 136/70 | HR 100 | Ht 61.0 in | Wt 128.0 lb

## 2013-09-18 DIAGNOSIS — I6529 Occlusion and stenosis of unspecified carotid artery: Secondary | ICD-10-CM | POA: Insufficient documentation

## 2013-09-18 DIAGNOSIS — I2510 Atherosclerotic heart disease of native coronary artery without angina pectoris: Secondary | ICD-10-CM | POA: Insufficient documentation

## 2013-09-18 DIAGNOSIS — I658 Occlusion and stenosis of other precerebral arteries: Secondary | ICD-10-CM | POA: Insufficient documentation

## 2013-09-18 DIAGNOSIS — I10 Essential (primary) hypertension: Secondary | ICD-10-CM | POA: Insufficient documentation

## 2013-09-18 DIAGNOSIS — Z87891 Personal history of nicotine dependence: Secondary | ICD-10-CM | POA: Insufficient documentation

## 2013-09-18 DIAGNOSIS — J449 Chronic obstructive pulmonary disease, unspecified: Secondary | ICD-10-CM | POA: Insufficient documentation

## 2013-09-18 DIAGNOSIS — J4489 Other specified chronic obstructive pulmonary disease: Secondary | ICD-10-CM | POA: Insufficient documentation

## 2013-09-18 DIAGNOSIS — E119 Type 2 diabetes mellitus without complications: Secondary | ICD-10-CM | POA: Insufficient documentation

## 2013-09-18 DIAGNOSIS — R002 Palpitations: Secondary | ICD-10-CM

## 2013-09-18 DIAGNOSIS — E785 Hyperlipidemia, unspecified: Secondary | ICD-10-CM | POA: Insufficient documentation

## 2013-09-18 DIAGNOSIS — F17201 Nicotine dependence, unspecified, in remission: Secondary | ICD-10-CM

## 2013-09-18 NOTE — Progress Notes (Signed)
History of Present Illness: 72 yo WF with prior history of HTN, HLD, COPD and CAD status post stenting of right coronary artery in 2005 and LAD in 2006 here for cardiac follow up. She has been followed in the past by Dr. Gala Romney. She underwent cardiac catheterization on July 09, 2010 due to CP, showed patent stents to the LAD and RCA with nonobstructive disease throughout. It was felt the majority of her symptoms were probably due to COPD and she was counseled to stop smoking. She was sent home on Imdur. The patient said she became extremely dizzy after she took one dose and had to stop it. Referred to pulmonary. Saw Dr. Maple Hudson. PFTs with severe COPD.   She is here today for follow up. She has no chest pains. Her breathing is at baseline. She wears supplemental oxygen. No significant CP. No symptoms of heart failure. No change in weight. No palpitations. Energy level is better off of metoprolol. Arm and leg aches better off of Vytorin.   Primary Care Physician: Andi Devon   Last Lipid Profile:Lipid Panel     Component Value Date/Time   CHOL 149 08/17/2012 1059   TRIG 227.0* 08/17/2012 1059   HDL 51.70 08/17/2012 1059   CHOLHDL 3 08/17/2012 1059   VLDL 45.4* 08/17/2012 1059     Past Medical History  Diagnosis Date  . CAD (coronary artery disease)     Stent RCA 2005, stent LAD 2006  . Hypertension   . Hyperlipemia   . COPD (chronic obstructive pulmonary disease)   . Tobacco abuse   . Fatigue   . Shortness of breath   . Myocardial infarction   . Heart murmur   . Recurrent upper respiratory infection (URI)   . Depression   . Carotid artery occlusion   . Diabetes mellitus without complication     Past Surgical History  Procedure Laterality Date  . Coronary stent placement      drug-eluting; right coronary artery  . Vesicovaginal fistula closure w/ tah    . Coronary angiography  Nov 25, 2004    CYPHER stenting, left anterior descending  . Cardiac catheterization   2011    Current Outpatient Prescriptions  Medication Sig Dispense Refill  . Aclidinium Bromide 400 MCG/ACT AEPB Inhale into the lungs.      Marland Kitchen albuterol (PROAIR HFA) 108 (90 BASE) MCG/ACT inhaler Inhale 2 puffs into the lungs 4 (four) times daily as needed. Rescue inhaler      . albuterol (PROVENTIL) (2.5 MG/3ML) 0.083% nebulizer solution Take 2.5 mg by nebulization every 6 (six) hours as needed for wheezing.      . Ascorbic Acid (VITAMIN C) 1000 MG tablet Take 1,000 mg by mouth daily.      Marland Kitchen aspirin EC 81 MG tablet Take 81 mg by mouth daily.      . calcium citrate-vitamin D 200-200 MG-UNIT TABS Take 1 tablet by mouth daily.      Marland Kitchen CINNAMON PO Take 2,000 tablets by mouth daily.      . clonazePAM (KLONOPIN) 1 MG tablet Take 1 mg by mouth 2 (two) times daily as needed. For anxiety.      . Fluticasone-Salmeterol (ADVAIR) 250-50 MCG/DOSE AEPB Inhale 1 puff into the lungs every 12 (twelve) hours.      . metFORMIN (GLUCOPHAGE-XR) 500 MG 24 hr tablet Take 500 mg by mouth daily with breakfast.      . Multiple Vitamin (MULTIVITAMIN) tablet Take 1 tablet by mouth daily.      Marland Kitchen  nitroGLYCERIN (NITROSTAT) 0.4 MG SL tablet Place 1 tablet (0.4 mg total) under the tongue every 5 (five) minutes as needed for chest pain.  25 tablet  6  . SPIRIVA HANDIHALER 18 MCG inhalation capsule As directed       No current facility-administered medications for this visit.    Allergies  Allergen Reactions  . Bactrim [Sulfamethoxazole-Trimethoprim]     nausea  . Codeine   . Vytorin [Ezetimibe-Simvastatin] Other (See Comments)    History   Social History  . Marital Status: Married    Spouse Name: N/A    Number of Children: 3  . Years of Education: N/A   Occupational History  . Retired Journalist, newspaper   Social History Main Topics  . Smoking status: Former Smoker -- 0.25 packs/day for 50 years    Types: Cigarettes    Quit date: 02/15/2012  . Smokeless tobacco: Never Used     Comment: started  smoking at age 63/// Pt is currently trying to quit smoking, using the electronic cigs, not longer smoking tobacco  . Alcohol Use: Yes     Comment: occasional wine  . Drug Use: No  . Sexual Activity: Not Currently    Birth Control/ Protection: Post-menopausal   Other Topics Concern  . Not on file   Social History Narrative   Husband smokes cigars    Family History  Problem Relation Age of Onset  . Diabetes Mother     deceased  . Heart disease Mother   . Alzheimer's disease Father     deceased  . Diabetes Sister   . Heart attack Sister     Review of Systems:  As stated in the HPI and otherwise negative.   BP 136/70  Pulse 100  Ht 5\' 1"  (1.549 m)  Wt 128 lb (58.06 kg)  BMI 24.20 kg/m2  Physical Examination: General: Well developed, well nourished, NAD HEENT: OP clear, mucus membranes moist SKIN: warm, dry. No rashes. Neuro: No focal deficits Musculoskeletal: Muscle strength 5/5 all ext Psychiatric: Mood and affect normal Neck: No JVD, no carotid bruits, no thyromegaly, no lymphadenopathy. Lungs:Clear bilaterally, no wheezes, rhonci, crackles Cardiovascular: Regular rate and rhythm. No murmurs, gallops or rubs. Abdomen:Soft. Bowel sounds present. Non-tender.  Extremities: No lower extremity edema. Pulses are 2 + in the bilateral DP/PT.  Carotid artery dopplers: 09/18/13: 60-79% bilateral stenosis. Patent vertebral   Assessment and Plan:   1. TOBACCO ABUSE, in remission: She has stopped smoking.   2. Palpitations: No recurrence. Holding beta blocker secondary to fatigue.   3. CAD: Stable. Continue current meds.   4. CAROTID ARTERY DISEASE : Stable by dopplers today. Repeat in 6 months.   5. Muscle aches: Likely secondary to statin. She has stopped Vytorin.   6. HTN: BP controlled. No changes

## 2013-09-18 NOTE — Patient Instructions (Signed)
Your physician wants you to follow-up in:  6 months.  You will receive a reminder letter in the mail two months in advance. If you don't receive a letter, please call our office to schedule the follow-up appointment.  Your physician has requested that you have a carotid duplex. This test is an ultrasound of the carotid arteries in your neck. It looks at blood flow through these arteries that supply the brain with blood. Allow one hour for this exam. There are no restrictions or special instructions. To be done in 6 months.

## 2014-01-13 ENCOUNTER — Other Ambulatory Visit: Payer: Self-pay | Admitting: Family

## 2014-01-13 ENCOUNTER — Ambulatory Visit
Admission: RE | Admit: 2014-01-13 | Discharge: 2014-01-13 | Disposition: A | Discharge status: Home / Self Care | Payer: Medicare Other | Source: Ambulatory Visit | Attending: Family | Admitting: Family

## 2014-01-13 DIAGNOSIS — W19XXXA Unspecified fall, initial encounter: Secondary | ICD-10-CM

## 2014-02-18 ENCOUNTER — Ambulatory Visit (INDEPENDENT_AMBULATORY_CARE_PROVIDER_SITE_OTHER): Payer: Medicare Other | Admitting: Internal Medicine

## 2014-02-18 ENCOUNTER — Encounter: Payer: Self-pay | Admitting: Internal Medicine

## 2014-02-18 ENCOUNTER — Encounter (INDEPENDENT_AMBULATORY_CARE_PROVIDER_SITE_OTHER): Payer: Self-pay

## 2014-02-18 VITALS — BP 118/62 | HR 83 | Ht 61.0 in | Wt 134.6 lb

## 2014-02-18 DIAGNOSIS — J449 Chronic obstructive pulmonary disease, unspecified: Secondary | ICD-10-CM

## 2014-02-18 DIAGNOSIS — F17200 Nicotine dependence, unspecified, uncomplicated: Secondary | ICD-10-CM

## 2014-02-18 DIAGNOSIS — J4489 Other specified chronic obstructive pulmonary disease: Secondary | ICD-10-CM

## 2014-02-18 NOTE — Patient Instructions (Addendum)
Sample Symbicort inhaler to use today if needed-2puffs, then rinse mouth, twice daily if needed. This is similar to Advair.

## 2014-02-18 NOTE — Progress Notes (Signed)
Patient ID: [name], female    DOB: [birth year], 73 y.o.   MRN: [id number]  HPI 03/21/11- 80 yoF followed for COPD, ongoing tobacco use, complicated by CAD Last here Nov 18, 2010- note reviewed.  Continues in a Spiriva drug trial CXR 01/25/11 - stable COPD, NAD She continues to find that sleeping with oxygen makes her feel much better. Continues Advair and Spiriva regularly. Rare need for rescue inhaler.  07/08/11 62 yoF followed for COPD, ongoing tobacco use, complicated by CAD, DM   Husband and daughter here Acute visit- Dyspnea   She complains of feeling somehow bad with more short of breath particularly in the last 2-3 weeks. Her husband says she has been steadily declining, weaker, for 4 or 5 months. She complains of pain in her left bicep if she tries to reach around to her back but this is not exertional pain otherwise and she denies angina-type pain or pleuritic pain. Mainly she is complaining of shortness of breath. There is some increased cough with scant clear mucus. She gets a burning sensation in the upper sternum just in the last few days. Some nausea last week. She was seen at her primary care office 4 days ago , given a Rocephin injection and started on Levaquin. Chest x-ray was done and she was told it was clear. She denies swollen glands, rash, nausea or vomiting, change in bowel or bladder , swelling or aching in legs. EKG- RSR, RAE, NAD  07/15/11-69 yoF followed for COPD, ongoing tobacco use, complicated by CAD, DM..........Marland Kitchenhusband here Much stronger and feeling much better than last time. Has reduced but not quit smoking-discussed. Notes desat walking through home on room air to as low as 83%. Discussed use of O2 for goal sat >/= 88% and discussed CO2 retention. Coughs up a little thick clear mucus.   10/31/11- 82 yoF followed for COPD, ongoing tobacco use, complicated by CAD, DM..........Marland Kitchenhusband here In past week just gotten over the breathing concerns since seen last  time and in December (when had flu like symptoms); Has been using O2 more-having low O2 levels when not on O2.(low as 75%). Unfortunately she has continued to smoke a few cigarettes daily against advice. She has been dealing with a sustained exacerbation of COPD really since October. Using oxygen continuously between 2 and 3 L per minute/Advanced. We talked about pulmonary rehabilitation. She never took the invitation to try the [facility] program. She said then that [facility] but she didn't do that either. She paces herself at home wearing oxygen, to do limited housework. She can move a vacuum cleaner but not move furniture. Bothersome pain in the left shoulder with decreased range of motion. Little cough, phlegm, exertional chest pain, edema. No blood. PFT-09/06/2010-reviewed again with her and her husband. Very severe obstructive airways disease, emphysema pattern, FEV1/FVC 0.28, DLCO 42%. She has a home nebulizer machine with albuterol and that works better for her than metered-dose inhalers.  01/30/12- 78 yoF followed for COPD, ongoing tobacco use, complicated by CAD, DM..........Marland Kitchenhusband here  Pt states breathing is worsening, increased sob. Pt is wearing O2 24/7 at 2-3LPM, pt states she is trying to quit smoking and is currently using the electronic cigarrette rather than the tobacco. She is worrking toward quitting. Pt states mucus is increased, green-yellow phlegm Now with 2 days of green sputum and watery rhinorrhea without sore throat or fever. She restarted Spiriva   CXR 07/04/11- IMPRESSION:  COPD. No  active lung disease.  Original Report Authenticated By: Joretta Bachelor, M.D.    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 Post hospital May 16 through May 18 for exacerbation of COPD. Daughter here  today. Has finally quit smoking. Shows me some green tinged mucus, not frankly purulent. Registers a CAT score of 11.  05/24/12- 46 yoF followed for COPD, ongoing tobacco use, complicated by CAD, DM..........Marland Kitchenhusband here Has noticed somedays coughing and able to cough something up-green in color but not often; having more "hot flashes" but unware of cause. SOB with activity. Describes occasional "white bumps" on her feet.  02/01/13- 83 yoF followed for COPD, ongoing tobacco use, complicated by CAD, DM..........Marland Kitchenhusband here FOLLOWS FOR: patient states she has good days and bad days w her breahting, has had some green mucus throughout the day,decreased energy level x1week . has increased o2 to 3L at all times x3weeks--off tudorza x1 wk (ran out) Home O2 3 L with activity, 2.5 L at rest/Advanced. Quit smoking one year ago CXR 8/27/ 13 IMPRESSION:  COPD with hyperinflation. No acute cardiopulmonary disease.  Original Report Authenticated By: Truett Perna, M.D.  08/20/13- 45 yoF former smoker followed for COPD,  complicated by CAD, DM..........Marland Kitchenhusband here Home O2 3 L  at rest/Advanced. FOLLOWS FOR: Pt states she has been doing well since last visit; has not been sick either. She feels better-noticing that she was able to walk in from parking lot through cold air by herself. Waiting for portable concentrator. Brother has died of COPD  02-27-2014-  37 yoF former smoker followed for COPD,  complicated by CAD, DM...........daughter here Home O2 3 L /Advanced. FOLLOWS FOR: Pt states some days she has productive cough-green in color and then other days she cant get anything up. Has happened in the last week-tired as well but no fevers. On arrival on 4L pulse, O2 sat 84%, up to 92% on continuous. Had tripped and fractured right ankle 2 months ago-no surgery. Clonazepam not much help for insomnia CXR 08/20/13 Stable cardiac and mediastinal contours. Lungs remain hyperinflated.  No large  consolidative pulmonary opacities. No pleural effusion or  pneumothorax. Regional skeleton is unremarkable.  IMPRESSION:  Stable chest radiograph.  Electronically Signed  By: Lovey Newcomer M.D.  On: 08/20/2013 13:51   Review of Systems- see HPI Constitutional:   No-   weight loss, +night sweats, fevers, chills,+ fatigue, lassitude. HEENT:   No-  headaches, difficulty swallowing, tooth/dental problems, sore throat,       No-  sneezing, itching, ear ache, nasal congestion, post nasal drip,  CV:  No-   chest pain, orthopnea, PND, +swelling in lower extremities, dizziness, palpitations Resp: +  shortness of breath with exertion or at rest.              No- productive cough,  + non-productive cough,  No-  coughing up of blood.              Occ +  change in color of mucus.  No- wheezing.   Skin: No-   rash or lesions. GI:  No-   heartburn, indigestion, abdominal pain, nausea, vomiting,  GU:  MS:  No-   joint pain or swelling.   Neuro- nothing unusual Psych:  No- change in mood or affect. No depression or anxiety.  No memory loss.  OBJ General- Alert, Oriented, Much  stronger.  O2 3L sat 91% Skin- rash-none, lesions- none, excoriation- none Lymphadenopathy- none Head- atraumatic            Eyes- Gross vision intact, PERRLA, conjunctivae clear secretions            Ears- Hearing, canals- normal            Nose- Clear, no-Septal dev, mucus, polyps, erosion, perforation             Throat- Mallampati II , mucosa clear , drainage- none, tonsils- atrophic Neck- flexible , trachea midline, no stridor , thyroid nl, carotid no bruit Chest - symmetrical excursion , unlabored           Heart/CV- RRR , no murmur , no gallop  , no rub, nl s1 s2                           - JVD- none , edema- none, stasis changes- none, varices- none. Good capillary                        filling.           Lung- +very distant, wheeze-none, cough+ dry , dullness-none, rub- none           Chest wall-  Abd-  Br/ Gen/  Rectal- Not done, not indicated Extrem- cyanosis- none, clubbing, none, atrophy- none, strength- nl, +right ankle wrapped Neuro- grossly intact to observation

## 2014-03-20 ENCOUNTER — Telehealth: Payer: Self-pay | Admitting: Internal Medicine

## 2014-03-20 MED ORDER — DOXYCYCLINE HYCLATE 100 MG PO TABS: ORAL_TABLET | ORAL | Status: DC

## 2014-03-20 NOTE — Telephone Encounter (Signed)
Spoke with pt and advised of Dr Janee Morn recommendations.  [facility]

## 2014-03-20 NOTE — Telephone Encounter (Signed)
Offer doxycycline 100 mg, # 8, 2 today then one daily 

## 2014-03-20 NOTE — Telephone Encounter (Signed)
Pt c/o prod cough (white- green).  SOB the same.  Denies fever or sinus symptoms.   PT is going out of town and is requesting an abx to take with her.  Please advise.  Allergies  Allergen Reactions  . Bactrim [Sulfamethoxazole-Trimethoprim]     nausea  . Codeine   . Vytorin [Ezetimibe-Simvastatin] Other (See Comments)    Current Outpatient Prescriptions on File Prior to Visit  Medication Sig Dispense Refill  . Aclidinium Bromide 400 MCG/ACT AEPB Inhale into the lungs.      Marland Kitchen albuterol (PROAIR HFA) 108 (90 BASE) MCG/ACT inhaler Inhale 2 puffs into the lungs 4 (four) times daily as needed. Rescue inhaler      . albuterol (PROVENTIL) (2.5 MG/3ML) 0.083% nebulizer solution Take 2.5 mg by nebulization every 6 (six) hours as needed for wheezing.      . Ascorbic Acid (VITAMIN C) 1000 MG tablet Take 1,000 mg by mouth daily.      Marland Kitchen aspirin EC 81 MG tablet Take 81 mg by mouth daily.      . calcium citrate-vitamin D 200-200 MG-UNIT TABS Take 1 tablet by mouth daily.      Marland Kitchen CINNAMON PO Take 2,000 tablets by mouth daily.      . clonazePAM (KLONOPIN) 1 MG tablet Take 1 mg by mouth 2 (two) times daily as needed. For anxiety.      . Fluticasone-Salmeterol (ADVAIR) 250-50 MCG/DOSE AEPB Inhale 1 puff into the lungs every 12 (twelve) hours.      . metFORMIN (GLUCOPHAGE-XR) 500 MG 24 hr tablet Take 500 mg by mouth daily with breakfast.      . Multiple Vitamin (MULTIVITAMIN) tablet Take 1 tablet by mouth daily.      . nitroGLYCERIN (NITROSTAT) 0.4 MG SL tablet Place 1 tablet (0.4 mg total) under the tongue every 5 (five) minutes as needed for chest pain.  25 tablet  6  . SPIRIVA HANDIHALER 18 MCG inhalation capsule As directed       No current facility-administered medications on file prior to visit.

## 2014-04-01 ENCOUNTER — Ambulatory Visit (INDEPENDENT_AMBULATORY_CARE_PROVIDER_SITE_OTHER): Payer: Medicare Other | Admitting: Cardiovascular Disease

## 2014-04-01 ENCOUNTER — Ambulatory Visit (HOSPITAL_COMMUNITY): Payer: Medicare Other | Attending: Cardiology | Admitting: Cardiology

## 2014-04-01 ENCOUNTER — Encounter: Payer: Self-pay | Admitting: Cardiovascular Disease

## 2014-04-01 ENCOUNTER — Other Ambulatory Visit (HOSPITAL_COMMUNITY): Payer: Self-pay

## 2014-04-01 VITALS — BP 140/76 | HR 82 | Ht 61.0 in | Wt 132.0 lb

## 2014-04-01 DIAGNOSIS — J4489 Other specified chronic obstructive pulmonary disease: Secondary | ICD-10-CM | POA: Insufficient documentation

## 2014-04-01 DIAGNOSIS — I779 Disorder of arteries and arterioles, unspecified: Secondary | ICD-10-CM

## 2014-04-01 DIAGNOSIS — E785 Hyperlipidemia, unspecified: Secondary | ICD-10-CM | POA: Insufficient documentation

## 2014-04-01 DIAGNOSIS — F17201 Nicotine dependence, unspecified, in remission: Secondary | ICD-10-CM

## 2014-04-01 DIAGNOSIS — I6529 Occlusion and stenosis of unspecified carotid artery: Secondary | ICD-10-CM | POA: Insufficient documentation

## 2014-04-01 DIAGNOSIS — J449 Chronic obstructive pulmonary disease, unspecified: Secondary | ICD-10-CM | POA: Insufficient documentation

## 2014-04-01 DIAGNOSIS — Z87891 Personal history of nicotine dependence: Secondary | ICD-10-CM

## 2014-04-01 DIAGNOSIS — E119 Type 2 diabetes mellitus without complications: Secondary | ICD-10-CM | POA: Insufficient documentation

## 2014-04-01 DIAGNOSIS — I10 Essential (primary) hypertension: Secondary | ICD-10-CM

## 2014-04-01 DIAGNOSIS — I2510 Atherosclerotic heart disease of native coronary artery without angina pectoris: Secondary | ICD-10-CM | POA: Insufficient documentation

## 2014-04-01 DIAGNOSIS — I739 Peripheral vascular disease, unspecified: Secondary | ICD-10-CM

## 2014-04-01 NOTE — Progress Notes (Signed)
Carotid duplex performed 

## 2014-04-01 NOTE — Patient Instructions (Signed)
Your physician wants you to follow-up in:  12 months.  You will receive a reminder letter in the mail two months in advance. If you don't receive a letter, please call our office to schedule the follow-up appointment.   

## 2014-04-01 NOTE — Progress Notes (Signed)
History of Present Illness: 73 yo WF with prior history of HTN, HLD, COPD and CAD status post stenting of right coronary artery in 2005 and LAD in 2006 here for cardiac follow up. She has been followed in the past by Dr. Haroldine Laws. She underwent cardiac catheterization on July 09, 2010 due to CP, showed patent stents to the LAD and RCA with nonobstructive disease throughout. It was felt the majority of her symptoms were probably due to COPD and she was counseled to stop smoking. She was sent home on Imdur. The patient said she became extremely dizzy after she took one dose and had to stop it. Referred to pulmonary. Saw Dr. Annamaria Boots. PFTs with severe COPD. She has not tolerated beta blockers due to fatigue. She has not tolerated statins due to arm and leg pains. Carotid dopplers 04/01/14 with stable 60-79% bilateral ICA stenosis.   She is here today for follow up. She has no chest pains. Her breathing is at baseline. She wears supplemental oxygen. No significant CP. No symptoms of heart failure. No change in weight. No palpitations.   Primary Care Physician: [facility]  Last Lipid Profile:Lipid Panel     Component Value Date/Time   CHOL 149 08/17/2012 1059   TRIG 227.0* 08/17/2012 1059   HDL 51.70 08/17/2012 1059   CHOLHDL 3 08/17/2012 1059   VLDL 45.4* 08/17/2012 1059    Past Medical History  Diagnosis Date  . CAD (coronary artery disease)     Stent RCA 2005, stent LAD 2006  . Hypertension   . Hyperlipemia   . COPD (chronic obstructive pulmonary disease)   . Tobacco abuse   . Fatigue   . Shortness of breath   . Myocardial infarction   . Heart murmur   . Recurrent upper respiratory infection (URI)   . Depression   . Carotid artery occlusion   . Diabetes mellitus without complication     Past Surgical History  Procedure Laterality Date  . Coronary stent placement      drug-eluting; right coronary artery  . Vesicovaginal fistula closure w/ tah    . Coronary  angiography  Nov 25, 2004    CYPHER stenting, left anterior descending  . Cardiac catheterization  2011    Current Outpatient Prescriptions  Medication Sig Dispense Refill  . Aclidinium Bromide 400 MCG/ACT AEPB Inhale into the lungs.      Marland Kitchen albuterol (PROAIR HFA) 108 (90 BASE) MCG/ACT inhaler Inhale 2 puffs into the lungs 4 (four) times daily as needed. Rescue inhaler      . albuterol (PROVENTIL) (2.5 MG/3ML) 0.083% nebulizer solution Take 2.5 mg by nebulization every 6 (six) hours as needed for wheezing.      . Ascorbic Acid (VITAMIN C) 1000 MG tablet Take 1,000 mg by mouth daily.      Marland Kitchen aspirin EC 81 MG tablet Take 81 mg by mouth daily.      . calcium citrate-vitamin D 200-200 MG-UNIT TABS Take 1 tablet by mouth daily.      Marland Kitchen CINNAMON PO Take 2,000 tablets by mouth daily.      . clonazePAM (KLONOPIN) 1 MG tablet Take 1 mg by mouth 2 (two) times daily as needed. For anxiety.      Marland Kitchen doxycycline (VIBRA-TABS) 100 MG tablet Take 2 tablets today then one tablet daily until gone  8 tablet  0  . Fluticasone-Salmeterol (ADVAIR) 250-50 MCG/DOSE AEPB Inhale 1 puff into the lungs every 12 (twelve) hours.      Marland Kitchen  metFORMIN (GLUCOPHAGE-XR) 500 MG 24 hr tablet Take 500 mg by mouth daily with breakfast.      . Multiple Vitamin (MULTIVITAMIN) tablet Take 1 tablet by mouth daily.      . nitroGLYCERIN (NITROSTAT) 0.4 MG SL tablet Place 1 tablet (0.4 mg total) under the tongue every 5 (five) minutes as needed for chest pain.  25 tablet  6  . SPIRIVA HANDIHALER 18 MCG inhalation capsule As directed       No current facility-administered medications for this visit.    Allergies  Allergen Reactions  . Bactrim [Sulfamethoxazole-Trimethoprim]     nausea  . Codeine   . Vytorin [Ezetimibe-Simvastatin] Other (See Comments)    History   Social History  . Marital Status: Married    Spouse Name: N/A    Number of Children: 3  . Years of Education: N/A   Occupational History  . Retired Advertising copywriter   Social History Main Topics  . Smoking status: Former Smoker -- 0.25 packs/day for 50 years    Types: Cigarettes    Quit date: 02/15/2012  . Smokeless tobacco: Never Used     Comment: started smoking at age 48/// Pt is currently trying to quit smoking, using the electronic cigs, not longer smoking tobacco  . Alcohol Use: Yes     Comment: occasional wine  . Drug Use: No  . Sexual Activity: Not Currently    Birth Control/ Protection: Post-menopausal   Other Topics Concern  . Not on file   Social History Narrative   Husband smokes cigars    Family History  Problem Relation Age of Onset  . Diabetes Mother     deceased  . Heart disease Mother   . Alzheimer's disease Father     deceased  . Diabetes Sister   . Heart attack Sister     Review of Systems:  As stated in the HPI and otherwise negative.   BP 140/76  Pulse 82  Ht 5\' 1"  (1.549 m)  Wt 132 lb (59.875 kg)  BMI 24.95 kg/m2  Physical Examination: General: Well developed, well nourished, NAD HEENT: OP clear, mucus membranes moist SKIN: warm, dry. No rashes. Neuro: No focal deficits Musculoskeletal: Muscle strength 5/5 all ext Psychiatric: Mood and affect normal Neck: No JVD, no carotid bruits, no thyromegaly, no lymphadenopathy. Lungs:Clear bilaterally, no wheezes, rhonci, crackles Cardiovascular: Regular rate and rhythm. No murmurs, gallops or rubs. Abdomen:Soft. Bowel sounds present. Non-tender.  Extremities: No lower extremity edema. Pulses are 2 + in the bilateral DP/PT.  Carotid artery dopplers: 09/18/13: 60-79% bilateral stenosis. Patent vertebral   EKG: NSR, rate 82 bpm.   Assessment and Plan:   1. TOBACCO ABUSE, in remission: She has stopped smoking in May 2013.   2. Palpitations: No recurrence. Holding beta blocker secondary to fatigue.   3. CAD: Stable. Continue current meds.   4. CAROTID ARTERY DISEASE : Stable by dopplers today. Repeat one year.   5. HTN: BP controlled. No changes

## 2014-04-05 ENCOUNTER — Encounter: Payer: Self-pay | Admitting: Internal Medicine

## 2014-04-05 NOTE — Assessment & Plan Note (Signed)
Without acute exacerbation. Chronic hypoxic respiratory failure oxygen dependent Plan-sample Symbicort today to hold as discussed

## 2014-04-05 NOTE — Assessment & Plan Note (Signed)
Remains off cigarettes 

## 2014-05-23 ENCOUNTER — Telehealth: Payer: Self-pay | Admitting: Internal Medicine

## 2014-05-23 MED ORDER — AZITHROMYCIN 250 MG PO TABS: ORAL_TABLET | ORAL | Status: DC

## 2014-05-23 NOTE — Telephone Encounter (Signed)
Offer Zpak 

## 2014-05-23 NOTE — Telephone Encounter (Signed)
Called spoke with spouse. He reports pt c/o severe sore throat (can barely talk), chills. No f/PND/no cough/no wheezing/no chest tx.  Wants something called in. Please advise thanks  Allergies  Allergen Reactions  . Bactrim [Sulfamethoxazole-Trimethoprim]     nausea  . Codeine   . Vytorin [Ezetimibe-Simvastatin] Other (See Comments)     Current Outpatient Prescriptions on File Prior to Visit  Medication Sig Dispense Refill  . Aclidinium Bromide 400 MCG/ACT AEPB Inhale into the lungs.      Marland Kitchen albuterol (PROAIR HFA) 108 (90 BASE) MCG/ACT inhaler Inhale 2 puffs into the lungs 4 (four) times daily as needed. Rescue inhaler      . albuterol (PROVENTIL) (2.5 MG/3ML) 0.083% nebulizer solution Take 2.5 mg by nebulization every 6 (six) hours as needed for wheezing.      . Ascorbic Acid (VITAMIN C) 1000 MG tablet Take 1,000 mg by mouth daily.      Marland Kitchen aspirin EC 81 MG tablet Take 81 mg by mouth daily.      . calcium citrate-vitamin D 200-200 MG-UNIT TABS Take 1 tablet by mouth daily.      Marland Kitchen CINNAMON PO Take 2,000 tablets by mouth daily.      . clonazePAM (KLONOPIN) 1 MG tablet Take 1 mg by mouth 2 (two) times daily as needed. For anxiety.      Marland Kitchen doxycycline (VIBRA-TABS) 100 MG tablet Take 2 tablets today then one tablet daily until gone  8 tablet  0  . Fluticasone-Salmeterol (ADVAIR) 250-50 MCG/DOSE AEPB Inhale 1 puff into the lungs every 12 (twelve) hours.      . metFORMIN (GLUCOPHAGE-XR) 500 MG 24 hr tablet Take 500 mg by mouth daily with breakfast.      . Multiple Vitamin (MULTIVITAMIN) tablet Take 1 tablet by mouth daily.      . nitroGLYCERIN (NITROSTAT) 0.4 MG SL tablet Place 1 tablet (0.4 mg total) under the tongue every 5 (five) minutes as needed for chest pain.  25 tablet  6  . SPIRIVA HANDIHALER 18 MCG inhalation capsule As directed       No current facility-administered medications on file prior to visit.

## 2014-05-23 NOTE — Telephone Encounter (Signed)
Spouse aware of recs. RX sent in. Nothing further needed 

## 2014-06-30 ENCOUNTER — Telehealth: Payer: Self-pay | Admitting: Internal Medicine

## 2014-06-30 MED ORDER — ALBUTEROL SULFATE (2.5 MG/3ML) 0.083% IN NEBU: 2.5000 mg | INHALATION_SOLUTION | Freq: Four times a day (QID) | RESPIRATORY_TRACT | Status: DC | PRN

## 2014-06-30 MED ORDER — AMOXICILLIN-POT CLAVULANATE 875-125 MG PO TABS: 1.0000 | ORAL_TABLET | Freq: Two times a day (BID) | ORAL | Status: DC

## 2014-06-30 NOTE — Telephone Encounter (Signed)
Offer augmentin 500 mg, # 14, 1 twice daily   

## 2014-06-30 NOTE — Telephone Encounter (Signed)
i called and spoke with pt and she is aware of rx for the augmentin and the albuterol for her nebulizer that has been sent to the pharmacy.  Nothing further is needed.

## 2014-06-30 NOTE — Telephone Encounter (Signed)
Called spoke with pt. She reports she is coughing up green phlem about 1-2 times daily, occas wheezing/chest tx, nasal congestion, PND x last month.  No increase SOB. No f/c/s/n/v. Pt is taking mucinex. Please advise CDY thanks  Allergies  Allergen Reactions  . Bactrim [Sulfamethoxazole-Trimethoprim]     nausea  . Codeine   . Vytorin [Ezetimibe-Simvastatin] Other (See Comments)     Current Outpatient Prescriptions on File Prior to Visit  Medication Sig Dispense Refill  . Aclidinium Bromide 400 MCG/ACT AEPB Inhale into the lungs.      Marland Kitchen albuterol (PROAIR HFA) 108 (90 BASE) MCG/ACT inhaler Inhale 2 puffs into the lungs 4 (four) times daily as needed. Rescue inhaler      . albuterol (PROVENTIL) (2.5 MG/3ML) 0.083% nebulizer solution Take 2.5 mg by nebulization every 6 (six) hours as needed for wheezing.      . Ascorbic Acid (VITAMIN C) 1000 MG tablet Take 1,000 mg by mouth daily.      Marland Kitchen aspirin EC 81 MG tablet Take 81 mg by mouth daily.      Marland Kitchen azithromycin (ZITHROMAX) 250 MG tablet TAKE AS DIRECTED  6 tablet  0  . calcium citrate-vitamin D 200-200 MG-UNIT TABS Take 1 tablet by mouth daily.      Marland Kitchen CINNAMON PO Take 2,000 tablets by mouth daily.      . clonazePAM (KLONOPIN) 1 MG tablet Take 1 mg by mouth 2 (two) times daily as needed. For anxiety.      Marland Kitchen doxycycline (VIBRA-TABS) 100 MG tablet Take 2 tablets today then one tablet daily until gone  8 tablet  0  . Fluticasone-Salmeterol (ADVAIR) 250-50 MCG/DOSE AEPB Inhale 1 puff into the lungs every 12 (twelve) hours.      . metFORMIN (GLUCOPHAGE-XR) 500 MG 24 hr tablet Take 500 mg by mouth daily with breakfast.      . Multiple Vitamin (MULTIVITAMIN) tablet Take 1 tablet by mouth daily.      . nitroGLYCERIN (NITROSTAT) 0.4 MG SL tablet Place 1 tablet (0.4 mg total) under the tongue every 5 (five) minutes as needed for chest pain.  25 tablet  6  . SPIRIVA HANDIHALER 18 MCG inhalation capsule As directed       No current facility-administered  medications on file prior to visit.

## 2014-07-22 ENCOUNTER — Other Ambulatory Visit (HOSPITAL_COMMUNITY): Payer: Self-pay | Admitting: Internal Medicine

## 2014-07-22 DIAGNOSIS — Z1231 Encounter for screening mammogram for malignant neoplasm of breast: Secondary | ICD-10-CM

## 2014-08-01 ENCOUNTER — Ambulatory Visit (HOSPITAL_COMMUNITY)
Admission: RE | Admit: 2014-08-01 | Discharge: 2014-08-01 | Disposition: A | Discharge status: Home / Self Care | Payer: Medicare Other | Source: Ambulatory Visit | Attending: Internal Medicine | Admitting: Internal Medicine

## 2014-08-01 DIAGNOSIS — Z1231 Encounter for screening mammogram for malignant neoplasm of breast: Secondary | ICD-10-CM | POA: Insufficient documentation

## 2014-08-19 ENCOUNTER — Ambulatory Visit (INDEPENDENT_AMBULATORY_CARE_PROVIDER_SITE_OTHER): Payer: Medicare Other | Admitting: Internal Medicine

## 2014-08-19 ENCOUNTER — Encounter: Payer: Self-pay | Admitting: Internal Medicine

## 2014-08-19 ENCOUNTER — Ambulatory Visit (INDEPENDENT_AMBULATORY_CARE_PROVIDER_SITE_OTHER)
Admission: RE | Admit: 2014-08-19 | Discharge: 2014-08-19 | Disposition: A | Discharge status: Home / Self Care | Payer: Medicare Other | Source: Ambulatory Visit | Attending: Internal Medicine | Admitting: Internal Medicine

## 2014-08-19 VITALS — BP 118/72 | HR 90 | Ht 61.0 in | Wt 131.8 lb

## 2014-08-19 DIAGNOSIS — J449 Chronic obstructive pulmonary disease, unspecified: Secondary | ICD-10-CM

## 2014-08-19 DIAGNOSIS — IMO0001 Reserved for inherently not codable concepts without codable children: Secondary | ICD-10-CM

## 2014-08-19 DIAGNOSIS — F17200 Nicotine dependence, unspecified, uncomplicated: Secondary | ICD-10-CM

## 2014-08-19 DIAGNOSIS — Z720 Tobacco use: Secondary | ICD-10-CM

## 2014-08-19 MED ORDER — TIOTROPIUM BROMIDE MONOHYDRATE 18 MCG IN CAPS: ORAL_CAPSULE | RESPIRATORY_TRACT | Status: DC

## 2014-08-19 MED ORDER — ALBUTEROL SULFATE HFA 108 (90 BASE) MCG/ACT IN AERS: 2.0000 | INHALATION_SPRAY | Freq: Four times a day (QID) | RESPIRATORY_TRACT | Status: DC | PRN

## 2014-08-19 MED ORDER — ALBUTEROL SULFATE (2.5 MG/3ML) 0.083% IN NEBU: 2.5000 mg | INHALATION_SOLUTION | Freq: Four times a day (QID) | RESPIRATORY_TRACT | Status: DC | PRN

## 2014-08-19 MED ORDER — FLUTICASONE-SALMETEROL 250-50 MCG/DOSE IN AEPB: 1.0000 | INHALATION_SPRAY | Freq: Two times a day (BID) | RESPIRATORY_TRACT | Status: DC

## 2014-08-19 NOTE — Progress Notes (Signed)
Patient ID: [name], female    DOB: [birth year], 73 y.o.   MRN: [id number]  HPI 03/21/11- 80 yoF followed for COPD, ongoing tobacco use, complicated by CAD Last here Nov 18, 2010- note reviewed.  Continues in a Spiriva drug trial CXR 01/25/11 - stable COPD, NAD She continues to find that sleeping with oxygen makes her feel much better. Continues Advair and Spiriva regularly. Rare need for rescue inhaler.  07/08/11 62 yoF followed for COPD, ongoing tobacco use, complicated by CAD, DM   Husband and daughter here Acute visit- Dyspnea   She complains of feeling somehow bad with more short of breath particularly in the last 2-3 weeks. Her husband says she has been steadily declining, weaker, for 4 or 5 months. She complains of pain in her left bicep if she tries to reach around to her back but this is not exertional pain otherwise and she denies angina-type pain or pleuritic pain. Mainly she is complaining of shortness of breath. There is some increased cough with scant clear mucus. She gets a burning sensation in the upper sternum just in the last few days. Some nausea last week. She was seen at her primary care office 4 days ago , given a Rocephin injection and started on Levaquin. Chest x-ray was done and she was told it was clear. She denies swollen glands, rash, nausea or vomiting, change in bowel or bladder , swelling or aching in legs. EKG- RSR, RAE, NAD  07/15/11-69 yoF followed for COPD, ongoing tobacco use, complicated by CAD, DM..........Marland Kitchenhusband here Much stronger and feeling much better than last time. Has reduced but not quit smoking-discussed. Notes desat walking through home on room air to as low as 83%. Discussed use of O2 for goal sat >/= 88% and discussed CO2 retention. Coughs up a little thick clear mucus.   10/31/11- 82 yoF followed for COPD, ongoing tobacco use, complicated by CAD, DM..........Marland Kitchenhusband here In past week just gotten over the breathing concerns since seen last  time and in December (when had flu like symptoms); Has been using O2 more-having low O2 levels when not on O2.(low as 75%). Unfortunately she has continued to smoke a few cigarettes daily against advice. She has been dealing with a sustained exacerbation of COPD really since October. Using oxygen continuously between 2 and 3 L per minute/Advanced. We talked about pulmonary rehabilitation. She never took the invitation to try the [facility] program. She said then that she would [facility] but she didn't do that either. She paces herself at home wearing oxygen, to do limited housework. She can move a vacuum cleaner but not move furniture. Bothersome pain in the left shoulder with decreased range of motion. Little cough, phlegm, exertional chest pain, edema. No blood. PFT-09/06/2010-reviewed again with her and her husband. Very severe obstructive airways disease, emphysema pattern, FEV1/FVC 0.28, DLCO 42%. She has a home nebulizer machine with albuterol and that works better for her than metered-dose inhalers.  01/30/12- 78 yoF followed for COPD, ongoing tobacco use, complicated by CAD, DM..........Marland Kitchenhusband here  Pt states breathing is worsening, increased sob. Pt is wearing O2 24/7 at 2-3LPM, pt states she is trying to quit smoking and is currently using the electronic cigarrette rather than the tobacco. She is worrking toward quitting. Pt states mucus is increased, green-yellow phlegm Now with 2 days of green sputum and watery rhinorrhea without sore throat or fever. She restarted Spiriva   CXR 07/04/11- IMPRESSION:  COPD. No  active lung disease.  Original Report Authenticated By: Joretta Bachelor, M.D.    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 Post hospital May 16 through May 18 for exacerbation of COPD. Daughter here  today. Has finally quit smoking. Shows me some green tinged mucus, not frankly purulent. Registers a CAT score of 11.  05/24/12- 25 yoF followed for COPD, ongoing tobacco use, complicated by CAD, DM..........Marland Kitchenhusband here Has noticed somedays coughing and able to cough something up-green in color but not often; having more "hot flashes" but unware of cause. SOB with activity. Describes occasional "white bumps" on her feet.  02/01/13- 55 yoF followed for COPD, ongoing tobacco use, complicated by CAD, DM..........Marland Kitchenhusband here FOLLOWS FOR: patient states she has good days and bad days w her breahting, has had some green mucus throughout the day,decreased energy level x1week . has increased o2 to 3L at all times x3weeks--off tudorza x1 wk (ran out) Home O2 3 L with activity, 2.5 L at rest/Advanced. Quit smoking one year ago CXR 8/27/ 13 IMPRESSION:  COPD with hyperinflation. No acute cardiopulmonary disease.  Original Report Authenticated By: Truett Perna, M.D.  08/20/13- 64 yoF former smoker followed for COPD,  complicated by CAD, DM..........Marland Kitchenhusband here Home O2 3 L  at rest/Advanced. FOLLOWS FOR: Pt states she has been doing well since last visit; has not been sick either. She feels better-noticing that she was able to walk in from parking lot through cold air by herself. Waiting for portable concentrator. Brother has died of COPD  03-08-2014-  43 yoF former smoker followed for COPD,  complicated by CAD, DM...........daughter here Home O2 3 L /Advanced. FOLLOWS FOR: Pt states some days she has productive cough-green in color and then other days she cant get anything up. Has happened in the last week-tired as well but no fevers. On arrival on 4L pulse, O2 sat 84%, up to 92% on continuous. Had tripped and fractured right ankle 2 months ago-no surgery. Clonazepam not much help for insomnia CXR 08/20/13 Stable cardiac and mediastinal contours. Lungs remain hyperinflated.  No large  consolidative pulmonary opacities. No pleural effusion or  pneumothorax. Regional skeleton is unremarkable.  IMPRESSION:  Stable chest radiograph.  Electronically Signed  By: Lovey Newcomer M.D.  On: 08/20/2013 13:51   08/19/14- 54 yoF former smoker followed for COPD,  complicated by CAD, DM..........Marland Kitchenhusband here    Had flu vax FOLLOWS FOR: Continues to have SOB with exertion if O2 2-3L is not up enough; DME is [facility]. Occ up at night for neb. DOE. No acute event or fluid retention, chest pain or palpitation.  Review of Systems- see HPI Constitutional:   No-   weight loss, +night sweats, fevers, chills,+ fatigue, lassitude. HEENT:   No-  headaches, difficulty swallowing, tooth/dental problems, sore throat,       No-  sneezing, itching, ear ache, nasal congestion, post nasal drip,  CV:  No-   chest pain, orthopnea, PND, +swelling in lower extremities, dizziness, palpitations Resp: +  shortness of breath with exertion or at rest.              No- productive cough,  + non-productive cough,  No-  coughing up of blood.              Occ +  change in color of mucus.  No- wheezing.   Skin:  No-   rash or lesions. GI:  No-   heartburn, indigestion, abdominal pain, nausea, vomiting,  GU:  MS:  No-   joint pain or swelling.   Neuro- nothing unusual Psych:  No- change in mood or affect. No depression or anxiety.  No memory loss.  OBJ General- Alert, Oriented, Much stronger.  O2 3L sat 94% Skin- rash-none, lesions- none, excoriation- none Lymphadenopathy- none Head- atraumatic            Eyes- Gross vision intact, PERRLA, conjunctivae clear secretions            Ears- Hearing, canals- normal            Nose- Clear, no-Septal dev, mucus, polyps, erosion, perforation             Throat- Mallampati II , mucosa clear , drainage- none, tonsils- atrophic Neck- flexible , trachea midline, no stridor , thyroid nl, carotid no bruit Chest - symmetrical excursion , unlabored           Heart/CV- RRR , no  murmur , no gallop  , no rub, nl s1 s2                           - JVD- none , edema- none, stasis changes- none, varices- none.                            Good capillary filling.           Lung- +very distant, wheeze-none, cough+ dry , dullness-none, rub- none           Chest wall-  Abd-  Br/ Gen/ Rectal- Not done, not indicated Extrem- cyanosis- none, clubbing, none, atrophy- none, strength- nl,  Neuro- grossly intact to observation

## 2014-08-19 NOTE — Patient Instructions (Addendum)
Order CXR  Dx COPD with bronchitis  Meds refilled

## 2014-08-24 NOTE — Assessment & Plan Note (Signed)
Discussed vaccine status and meds.  Plan CXR

## 2014-08-24 NOTE — Assessment & Plan Note (Signed)
Encouraged to remain abstinent. 

## 2014-08-25 ENCOUNTER — Telehealth: Payer: Self-pay | Admitting: Internal Medicine

## 2014-08-25 NOTE — Telephone Encounter (Signed)
Notes Recorded by Deneise Lever, MD on 08/20/2014 at 9:31 PM CXR- stable COPD. No new process. ---  I spoke with patient about results and she verbalized understanding. She had no further questions

## 2014-10-06 DIAGNOSIS — J449 Chronic obstructive pulmonary disease, unspecified: Secondary | ICD-10-CM | POA: Diagnosis not present

## 2014-10-15 DIAGNOSIS — E1165 Type 2 diabetes mellitus with hyperglycemia: Secondary | ICD-10-CM | POA: Diagnosis not present

## 2014-10-15 DIAGNOSIS — J440 Chronic obstructive pulmonary disease with acute lower respiratory infection: Secondary | ICD-10-CM | POA: Diagnosis not present

## 2014-10-15 DIAGNOSIS — E784 Other hyperlipidemia: Secondary | ICD-10-CM | POA: Diagnosis not present

## 2014-10-15 DIAGNOSIS — I2510 Atherosclerotic heart disease of native coronary artery without angina pectoris: Secondary | ICD-10-CM | POA: Diagnosis not present

## 2014-10-15 DIAGNOSIS — F419 Anxiety disorder, unspecified: Secondary | ICD-10-CM | POA: Diagnosis not present

## 2014-10-30 ENCOUNTER — Telehealth: Payer: Self-pay | Admitting: Internal Medicine

## 2014-10-30 MED ORDER — AMOXICILLIN-POT CLAVULANATE 875-125 MG PO TABS: 1.0000 | ORAL_TABLET | Freq: Two times a day (BID) | ORAL | Status: DC

## 2014-10-30 NOTE — Telephone Encounter (Signed)
Ok to re- order augmentin as we did before

## 2014-10-30 NOTE — Telephone Encounter (Signed)
Spoke with pt's daughter. Reports dry cough, hoarseness, headache and SOB. Denies chest tightness but does say that her chest feels heavy. Pt is using 2.5-3L/min with minimal relief. Would like something called in.  Allergies  Allergen Reactions  . Bactrim [Sulfamethoxazole-Trimethoprim]     nausea  . Codeine   . Vytorin [Ezetimibe-Simvastatin] Other (See Comments)   CY - please advise. Thanks.

## 2014-10-30 NOTE — Telephone Encounter (Signed)
Called spoke with daughter [name]. Aware of results. Nothing further needed

## 2014-10-30 NOTE — Telephone Encounter (Signed)
Offer z pak

## 2014-10-30 NOTE — Telephone Encounter (Signed)
Called spoke with patient's daughter [name] and discussed [name]'s recommendations with her [name] reports that pt "does not do well" with zpak and this causes diarrhea (NOT noted in pt's allergy list) [name] and patient report the Augmentin sent for pt in 06/2014 did work well for pt  Dr Annamaria Boots please advise, thank you.

## 2014-11-03 ENCOUNTER — Ambulatory Visit (INDEPENDENT_AMBULATORY_CARE_PROVIDER_SITE_OTHER): Payer: Medicare Other | Admitting: Adult Health

## 2014-11-03 ENCOUNTER — Encounter: Payer: Self-pay | Admitting: Adult Health

## 2014-11-03 VITALS — BP 122/74 | HR 99 | Temp 98.1°F | Ht 61.25 in | Wt 127.2 lb

## 2014-11-03 DIAGNOSIS — J449 Chronic obstructive pulmonary disease, unspecified: Secondary | ICD-10-CM | POA: Diagnosis not present

## 2014-11-03 MED ORDER — PREDNISONE 10 MG PO TABS: ORAL_TABLET | ORAL | Status: DC

## 2014-11-03 NOTE — Assessment & Plan Note (Signed)
Flare -slow to resolve   Plan  Finish Augmentin .  Mucinex DM Twice daily  As needed  Cough/congestion  Fluids and rest .  Prednisone taper over next week.  Please contact office for sooner follow up if symptoms do not improve or worsen or seek emergency care  Follow up Dr. Annamaria Boots  In 6 weeks and As needed

## 2014-11-03 NOTE — Progress Notes (Signed)
Patient ID: [name], female    DOB: [birth year], 74 y.o.   MRN: [id number]  HPI 03/21/11- 80 yoF followed for COPD, ongoing tobacco use, complicated by CAD Last here Nov 18, 2010- note reviewed.  Continues in a Spiriva drug trial CXR 01/25/11 - stable COPD, NAD She continues to find that sleeping with oxygen makes her feel much better. Continues Advair and Spiriva regularly. Rare need for rescue inhaler.  07/08/11 62 yoF followed for COPD, ongoing tobacco use, complicated by CAD, DM   Husband and daughter here Acute visit- Dyspnea   She complains of feeling somehow bad with more short of breath particularly in the last 2-3 weeks. Her husband says she has been steadily declining, weaker, for 4 or 5 months. She complains of pain in her left bicep if she tries to reach around to her back but this is not exertional pain otherwise and she denies angina-type pain or pleuritic pain. Mainly she is complaining of shortness of breath. There is some increased cough with scant clear mucus. She gets a burning sensation in the upper sternum just in the last few days. Some nausea last week. She was seen at her primary care office 4 days ago , given a Rocephin injection and started on Levaquin. Chest x-ray was done and she was told it was clear. She denies swollen glands, rash, nausea or vomiting, change in bowel or bladder , swelling or aching in legs. EKG- RSR, RAE, NAD  07/15/11-69 yoF followed for COPD, ongoing tobacco use, complicated by CAD, DM..........Marland Kitchenhusband here Much stronger and feeling much better than last time. Has reduced but not quit smoking-discussed. Notes desat walking through home on room air to as low as 83%. Discussed use of O2 for goal sat >/= 88% and discussed CO2 retention. Coughs up a little thick clear mucus.   10/31/11- 82 yoF followed for COPD, ongoing tobacco use, complicated by CAD, DM..........Marland Kitchenhusband here In past week just gotten over the breathing concerns since seen last  time and in December (when had flu like symptoms); Has been using O2 more-having low O2 levels when not on O2.(low as 75%). Unfortunately she has continued to smoke a few cigarettes daily against advice. She has been dealing with a sustained exacerbation of COPD really since October. Using oxygen continuously between 2 and 3 L per minute/Advanced. We talked about pulmonary rehabilitation. She never took the invitation to try the [facility] program. She said then that [facility] but she [facility]'t [facility] that either. She paces herself at home wearing oxygen, to do limited housework. She can move a vacuum cleaner but not move furniture. Bothersome pain in the left shoulder with decreased range of motion. Little cough, phlegm, exertional chest pain, edema. No blood. PFT-09/06/2010-reviewed again with her and her husband. Very severe obstructive airways disease, emphysema pattern, FEV1/FVC 0.28, DLCO 42%. She has a home nebulizer machine with albuterol and that works better for her than metered-dose inhalers.  01/30/12- 78 yoF followed for COPD, ongoing tobacco use, complicated by CAD, DM..........Marland Kitchenhusband here  Pt states breathing is worsening, increased sob. Pt is wearing O2 24/7 at 2-3LPM, pt states she is trying to quit smoking and is currently using the electronic cigarrette rather than the tobacco. She is worrking toward quitting. Pt states mucus is increased, green-yellow phlegm Now with 2 days of green sputum and watery rhinorrhea without sore throat or fever. She restarted Spiriva   CXR 07/04/11- IMPRESSION:  COPD. No  active lung disease.  Original Report Authenticated By: Joretta Bachelor, M.D.    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 Post hospital May 16 through May 18 for exacerbation of COPD. Daughter here  today. Has finally quit smoking. Shows me some green tinged mucus, not frankly purulent. Registers a CAT score of 11.  05/24/12- 50 yoF followed for COPD, ongoing tobacco use, complicated by CAD, DM..........Marland Kitchenhusband here Has noticed somedays coughing and able to cough something up-green in color but not often; having more "hot flashes" but unware of cause. SOB with activity. Describes occasional "white bumps" on her feet.  02/01/13- 54 yoF followed for COPD, ongoing tobacco use, complicated by CAD, DM..........Marland Kitchenhusband here FOLLOWS FOR: patient states she has good days and bad days w her breahting, has had some green mucus throughout the day,decreased energy level x1week . has increased o2 to 3L at all times x3weeks--off tudorza x1 wk (ran out) Home O2 3 L with activity, 2.5 L at rest/Advanced. Quit smoking one year ago CXR 8/27/ 13 IMPRESSION:  COPD with hyperinflation. No acute cardiopulmonary disease.  Original Report Authenticated By: Truett Perna, M.D.  08/20/13- 48 yoF former smoker followed for COPD,  complicated by CAD, DM..........Marland Kitchenhusband here Home O2 3 L  at rest/Advanced. FOLLOWS FOR: Pt states she has been doing well since last visit; has not been sick either. She feels better-noticing that she was able to walk in from parking lot through cold air by herself. Waiting for portable concentrator. Brother has died of COPD  March 04, 2014-  52 yoF former smoker followed for COPD,  complicated by CAD, DM...........daughter here Home O2 3 L /Advanced. FOLLOWS FOR: Pt states some days she has productive cough-green in color and then other days she cant get anything up. Has happened in the last week-tired as well but no fevers. On arrival on 4L pulse, O2 sat 84%, up to 92% on continuous. Had tripped and fractured right ankle 2 months ago-no surgery. Clonazepam not much help for insomnia CXR 08/20/13 Stable cardiac and mediastinal contours. Lungs remain hyperinflated.  No large  consolidative pulmonary opacities. No pleural effusion or  pneumothorax. Regional skeleton is unremarkable.  IMPRESSION:  Stable chest radiograph.  Electronically Signed  By: Lovey Newcomer M.D.  On: 08/20/2013 13:51   08/19/14- 4 yoF former smoker followed for COPD,  complicated by CAD, DM..........Marland Kitchenhusband here    Had flu vax FOLLOWS FOR: Continues to have SOB with exertion if O2 2-3L is not up enough; DME is [facility]. Occ up at night for neb. DOE. No acute event or fluid retention, chest pain or palpitation.   11/03/2014 Acute OV -former smoker followed for COPD,  complicated by CAD, DM. On home O2 at 2-3l/m . Remains on Spiriva and Advair.  Complains of CY pt here for increased SOB, dry cough, wheezing/rattling at night, decreased energy x4-5days.  denies f/c/s, hemoptysis, PND or leg swelling.   Began Augmentin 1/29 x7days No hemoptysis , chest pian, orthopnea, or edema.     Review of Systems- see HPI Constitutional:   No  weight loss, night sweats,  Fevers, chills,  +fatigue, or  lassitude.  HEENT:   No headaches,  Difficulty swallowing,  Tooth/dental problems, or  Sore throat,                No sneezing, itching, ear ache,  +nasal congestion,  post nasal drip,   CV:  No chest pain,  Orthopnea, PND, swelling in lower extremities, anasarca, dizziness, palpitations, syncope.   GI  No heartburn, indigestion, abdominal pain, nausea, vomiting, diarrhea, change in bowel habits, loss of appetite, bloody stools.   Resp:    No wheezing.  No chest wall deformity  Skin: no rash or lesions.  GU: no dysuria, change in color of urine, no urgency or frequency.  No flank pain, no hematuria   MS:  No joint pain or swelling.  No decreased range of motion.  No back pain.  Psych:  No change in mood or affect. No depression or anxiety.  No memory loss.        GEN: A/Ox3; pleasant , NAD, chronically ill appearing , in w/c  HEENT:  [place]/AT,  EACs-clear, TMs-wnl, NOSE-clear, THROAT-clear, no  lesions, no postnasal drip or exudate noted.   NECK:  Supple w/ fair ROM; no JVD; normal carotid impulses w/o bruits; no thyromegaly or nodules palpated; no lymphadenopathy.  RESP  Decreased BS in bases , w/o, wheezes/ rales/ or rhonchi.no accessory muscle use, no dullness to percussion  CARD:  RRR, no m/r/g  , no peripheral edema, pulses intact, no cyanosis or clubbing.  GI:   Soft & nt; nml bowel sounds; no organomegaly or masses detected.  Musco: Warm bil, no deformities or joint swelling noted.   Neuro: alert, no focal deficits noted.    Skin: Warm, no lesions or rashes

## 2014-11-03 NOTE — Patient Instructions (Addendum)
Finish Augmentin .  Mucinex DM Twice daily  As needed  Cough/congestion  Fluids and rest .  Prednisone taper over next week.  Please contact office for sooner follow up if symptoms do not improve or worsen or seek emergency care  Follow up Dr. Annamaria Boots  In 6 weeks and As needed

## 2014-11-06 DIAGNOSIS — J449 Chronic obstructive pulmonary disease, unspecified: Secondary | ICD-10-CM | POA: Diagnosis not present

## 2014-11-07 ENCOUNTER — Telehealth: Payer: Self-pay | Admitting: Cardiovascular Disease

## 2014-11-07 NOTE — Telephone Encounter (Signed)
New Message     Patient feels nauseated and SOB. (Husband called and was concerned about it)  Please give a call back.

## 2014-11-07 NOTE — Telephone Encounter (Signed)
I called the home number and no answer.  I left a message on the pt's voicemail to contact our office.

## 2014-11-20 NOTE — Telephone Encounter (Signed)
Spoke with pt's husband.  States wife told him our office called.  He reports wife continues to be short of breath and has decreased energy. Known COPD.Recently treated with antibiotic and prednisone.  No complaints of chest pain.  Husband concerned symptoms could be related to heart.  I offered appt with Richardson Dopp, PA for 11/24/14 but husband states pt is reluctant to come in for office visit.  He will discuss with her and call us back if pt would like office visit.

## 2014-12-05 DIAGNOSIS — J449 Chronic obstructive pulmonary disease, unspecified: Secondary | ICD-10-CM | POA: Diagnosis not present

## 2014-12-19 ENCOUNTER — Ambulatory Visit (INDEPENDENT_AMBULATORY_CARE_PROVIDER_SITE_OTHER)
Admission: RE | Admit: 2014-12-19 | Discharge: 2014-12-19 | Disposition: A | Discharge status: Home / Self Care | Payer: Medicare Other | Source: Ambulatory Visit | Attending: Internal Medicine | Admitting: Internal Medicine

## 2014-12-19 ENCOUNTER — Encounter: Payer: Self-pay | Admitting: Internal Medicine

## 2014-12-19 ENCOUNTER — Ambulatory Visit (INDEPENDENT_AMBULATORY_CARE_PROVIDER_SITE_OTHER): Payer: Medicare Other | Admitting: Internal Medicine

## 2014-12-19 VITALS — BP 110/72 | HR 93 | Ht 61.0 in | Wt 125.2 lb

## 2014-12-19 DIAGNOSIS — J432 Centrilobular emphysema: Secondary | ICD-10-CM | POA: Diagnosis not present

## 2014-12-19 DIAGNOSIS — J449 Chronic obstructive pulmonary disease, unspecified: Secondary | ICD-10-CM

## 2014-12-19 DIAGNOSIS — Z720 Tobacco use: Secondary | ICD-10-CM

## 2014-12-19 DIAGNOSIS — R05 Cough: Secondary | ICD-10-CM | POA: Diagnosis not present

## 2014-12-19 DIAGNOSIS — R0789 Other chest pain: Secondary | ICD-10-CM | POA: Diagnosis not present

## 2014-12-19 DIAGNOSIS — F17201 Nicotine dependence, unspecified, in remission: Secondary | ICD-10-CM

## 2014-12-19 DIAGNOSIS — R0602 Shortness of breath: Secondary | ICD-10-CM | POA: Diagnosis not present

## 2014-12-19 MED ORDER — ALBUTEROL SULFATE 108 (90 BASE) MCG/ACT IN AEPB: 2.0000 | INHALATION_SPRAY | Freq: Four times a day (QID) | RESPIRATORY_TRACT | Status: DC | PRN

## 2014-12-19 NOTE — Patient Instructions (Signed)
Printed script and coupon for [employer].  When the free one runs out, you can either refill it, or go back to the traditional Proair- whichever is cheaper  Order- CXR  Dx centrilobular emphysema, R chest wall pain  Consider restarting the Advair disk  1 puff then rinse mouth, twice daily as an everyday maintenance controller

## 2014-12-19 NOTE — Progress Notes (Signed)
Patient ID: [name], female    DOB: [birth year], 74 y.o.   MRN: [id number]  HPI 03/21/11- 40 yoF followed for COPD, ongoing tobacco use, complicated by CAD Last here Nov 18, 2010- note reviewed.  Continues in a Spiriva drug trial CXR 01/25/11 - stable COPD, NAD She continues to find that sleeping with oxygen makes her feel much better. Continues Advair and Spiriva regularly. Rare need for rescue inhaler.  07/08/11 70 yoF followed for COPD, ongoing tobacco use, complicated by CAD, DM   Husband and daughter here Acute visit- Dyspnea   She complains of feeling somehow bad with more short of breath particularly in the last 2-3 weeks. Her husband says she has been steadily declining, weaker, for 4 or 5 months. She complains of pain in her left bicep if she tries to reach around to her back but this is not exertional pain otherwise and she denies angina-type pain or pleuritic pain. Mainly she is complaining of shortness of breath. There is some increased cough with scant clear mucus. She gets a burning sensation in the upper sternum just in the last few days. Some nausea last week. She was seen at her primary care office 4 days ago , given a Rocephin injection and started on Levaquin. Chest x-ray was done and she was told it was clear. She denies swollen glands, rash, nausea or vomiting, change in bowel or bladder , swelling or aching in legs. EKG- RSR, RAE, NAD  07/15/11-69 yoF followed for COPD, ongoing tobacco use, complicated by CAD, DM..........Marland Kitchenhusband here Much stronger and feeling much better than last time. Has reduced but not quit smoking-discussed. Notes desat walking through home on room air to as low as 83%. Discussed use of O2 for goal sat >/= 88% and discussed CO2 retention. Coughs up a little thick clear mucus.   10/31/11- 35 yoF followed for COPD, ongoing tobacco use, complicated by CAD, DM..........Marland Kitchenhusband here In past week just gotten over the breathing concerns since seen last  time and in December (when had flu like symptoms); Has been using O2 more-having low O2 levels when not on O2.(low as 75%). Unfortunately she has continued to smoke a few cigarettes daily against advice. She has been dealing with a sustained exacerbation of COPD really since October. Using oxygen continuously between 2 and 3 L per minute/Advanced. We talked about pulmonary rehabilitation. She never took the invitation to try the [facility] program. She said then that she would [facility] but she didn't do that either. She paces herself at home wearing oxygen, to do limited housework. She can move a vacuum cleaner but not move furniture. Bothersome pain in the left shoulder with decreased range of motion. Little cough, phlegm, exertional chest pain, edema. No blood. PFT-09/06/2010-reviewed again with her and her husband. Very severe obstructive airways disease, emphysema pattern, FEV1/FVC 0.28, DLCO 42%. She has a home nebulizer machine with albuterol and that works better for her than metered-dose inhalers.  01/30/12- 23 yoF followed for COPD, ongoing tobacco use, complicated by CAD, DM..........Marland Kitchenhusband here  Pt states breathing is worsening, increased sob. Pt is wearing O2 24/7 at 2-3LPM, pt states she is trying to quit smoking and is currently using the electronic cigarrette rather than the tobacco. She is worrking toward quitting. Pt states mucus is increased, green-yellow phlegm Now with 2 days of green sputum and watery rhinorrhea without sore throat or fever. She restarted Spiriva   CXR 07/04/11- IMPRESSION:  COPD. No  active lung disease.  Original Report Authenticated By: Joretta Bachelor, M.D.    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 Post hospital May 16 through May 18 for exacerbation of COPD. Daughter here  today. Has finally quit smoking. Shows me some green tinged mucus, not frankly purulent. Registers a CAT score of 11.  05/24/12- 73 yoF followed for COPD, ongoing tobacco use, complicated by CAD, DM..........Marland Kitchenhusband here Has noticed somedays coughing and able to cough something up-green in color but not often; having more "hot flashes" but unware of cause. SOB with activity. Describes occasional "white bumps" on her feet.  02/01/13- 30 yoF followed for COPD, ongoing tobacco use, complicated by CAD, DM..........Marland Kitchenhusband here FOLLOWS FOR: patient states she has good days and bad days w her breahting, has had some green mucus throughout the day,decreased energy level x1week . has increased o2 to 3L at all times x3weeks--off tudorza x1 wk (ran out) Home O2 3 L with activity, 2.5 L at rest/Advanced. Quit smoking one year ago CXR 8/27/ 13 IMPRESSION:  COPD with hyperinflation. No acute cardiopulmonary disease.  Original Report Authenticated By: Truett Perna, M.D.  08/20/13- 85 yoF former smoker followed for COPD,  complicated by CAD, DM..........Marland Kitchenhusband here Home O2 3 L  at rest/Advanced. FOLLOWS FOR: Pt states she has been doing well since last visit; has not been sick either. She feels better-noticing that she was able to walk in from parking lot through cold air by herself. Waiting for portable concentrator. Brother has died of COPD  2014-02-20-  11 yoF former smoker followed for COPD,  complicated by CAD, DM...........daughter here Home O2 3 L /Advanced. FOLLOWS FOR: Pt states some days she has productive cough-green in color and then other days she cant get anything up. Has happened in the last week-tired as well but no fevers. On arrival on 4L pulse, O2 sat 84%, up to 92% on continuous. Had tripped and fractured right ankle 2 months ago-no surgery. Clonazepam not much help for insomnia CXR 08/20/13 Stable cardiac and mediastinal contours. Lungs remain hyperinflated.  No large  consolidative pulmonary opacities. No pleural effusion or  pneumothorax. Regional skeleton is unremarkable.  IMPRESSION:  Stable chest radiograph.  Electronically Signed  By: Lovey Newcomer M.D.  On: 08/20/2013 13:51   08/19/14- 46 yoF former smoker followed for COPD,  complicated by CAD, DM..........Marland Kitchenhusband here    Had flu vax FOLLOWS FOR: Continues to have SOB with exertion if O2 2-3L is not up enough; DME is [facility]. Occ up at night for neb. DOE. No acute event or fluid retention, chest pain or palpitation.  11/03/2014 Acute OV -former smoker followed for COPD,  complicated by CAD, DM. On home O2 at 2-3l/m . Remains on Spiriva and Advair.  Complains of CY pt here for increased SOB, dry cough, wheezing/rattling at night, decreased energy x4-5days.  denies f/c/s, hemoptysis, PND or leg swelling.   Began Augmentin 1/29 x7days No hemoptysis , chest pian, orthopnea, or edema.   12/19/14-  Acute OV -former smoker followed for COPD,  complicated by CAD, DM.   Husband here FOLLOWS FOR: Pt having good and bad days with breathing.  Pt has noticed pain in right side and back for about 1 week ago. O2 2-3L/M Advanced Sputum sometimes gray or green with no blood, fever or sweat. She has not been using Advair routinely.  When shortness of breath gets worse she will use her nebulizer 3 or 4 times a day. Has had sharp "grabbing" pains right lateral chest intermittently-not really pleuritic and not clearly related to twisting or bending, x3 days. CXR 08/20/14 IMPRESSION: 1. COPD. Pleural parenchymal scarring . 2. No acute abnormality. Electronically Signed  By: Marcello Moores Register  On: 08/20/2014 08:56  ROS-see HPI   Negative unless "+" Constitutional:    weight loss, night sweats, fevers, chills, fatigue, lassitude. HEENT:    headaches, difficulty swallowing, tooth/dental problems, sore throat,       sneezing, itching, ear ache, nasal congestion, post nasal drip, snoring CV:    +chest pain, orthopnea, PND,  swelling in lower extremities, anasarca,                                  dizziness, palpitations Resp:   +shortness of breath with exertion or at rest.                +productive cough,   +non-productive cough, coughing up of blood.              +change in color of mucus.  wheezing.   Skin:    rash or lesions. GI:  No-   heartburn, indigestion, abdominal pain, nausea, vomiting,  GU: . MS:   joint pain, stiffness, . Neuro-     nothing unusual Psych:  change in mood or affect.  depression or anxiety.   memory loss.  OBJ- Physical Exam   O2 3L General- Alert, Oriented, Affect-appropriate, Distress- none acute Skin- rash-none, lesions- none, excoriation- none Lymphadenopathy- none Head- atraumatic            Eyes- Gross vision intact, PERRLA, conjunctivae and secretions clear            Ears- Hearing, canals-normal            Nose- Clear, no-Septal dev, mucus, polyps, erosion, perforation             Throat- Mallampati II , mucosa clear , drainage- none, tonsils- atrophic Neck- flexible , trachea midline, no stridor , thyroid nl, carotid no bruit Chest - symmetrical excursion , unlabored           Heart/CV- RRR , no murmur , no gallop  , no rub, nl s1 s2                           - JVD- none , edema- none, stasis changes- none, varices- none           Lung- clear to P&A, wheeze- none, cough- none , dullness-none, rub- none           Chest wall- no rub, no tenderness to light pressure Abd-  Br/ Gen/ Rectal- Not done, not indicated Extrem- cyanosis- none, clubbing, none, atrophy- none, strength- nl Neuro- grossly intact to observation

## 2014-12-21 DIAGNOSIS — R0789 Other chest pain: Secondary | ICD-10-CM | POA: Insufficient documentation

## 2014-12-21 NOTE — Assessment & Plan Note (Signed)
Description is not characteristic of a specific problem but this is likely a cracked rib or intercostal in origin. Plan-chest x-ray, heat, ibuprofen

## 2014-12-21 NOTE — Assessment & Plan Note (Signed)
She seems not tempted to go back to smoking.

## 2014-12-21 NOTE — Assessment & Plan Note (Addendum)
Chronic hypoxic respiratory failure. Limited exercise tolerance. Probable occasional exacerbation of bronchitis but she is not acutely ill Plan-discount card for ProAir Respiclick inhaler. After the first free 1 she will probably go back to standard because it is cheaper. I suggested she go back to regular maintenance use of her Advair. I wrote this instruction out for her because she seems sometimes not to understand.

## 2015-01-05 DIAGNOSIS — J449 Chronic obstructive pulmonary disease, unspecified: Secondary | ICD-10-CM | POA: Diagnosis not present

## 2015-01-20 DIAGNOSIS — E1165 Type 2 diabetes mellitus with hyperglycemia: Secondary | ICD-10-CM | POA: Diagnosis not present

## 2015-01-20 DIAGNOSIS — J9611 Chronic respiratory failure with hypoxia: Secondary | ICD-10-CM | POA: Diagnosis not present

## 2015-01-20 DIAGNOSIS — J440 Chronic obstructive pulmonary disease with acute lower respiratory infection: Secondary | ICD-10-CM | POA: Diagnosis not present

## 2015-01-20 DIAGNOSIS — E784 Other hyperlipidemia: Secondary | ICD-10-CM | POA: Diagnosis not present

## 2015-01-20 DIAGNOSIS — F419 Anxiety disorder, unspecified: Secondary | ICD-10-CM | POA: Diagnosis not present

## 2015-02-04 DIAGNOSIS — J449 Chronic obstructive pulmonary disease, unspecified: Secondary | ICD-10-CM | POA: Diagnosis not present

## 2015-02-12 DIAGNOSIS — L821 Other seborrheic keratosis: Secondary | ICD-10-CM | POA: Diagnosis not present

## 2015-02-12 DIAGNOSIS — D2261 Melanocytic nevi of right upper limb, including shoulder: Secondary | ICD-10-CM | POA: Diagnosis not present

## 2015-02-12 DIAGNOSIS — D692 Other nonthrombocytopenic purpura: Secondary | ICD-10-CM | POA: Diagnosis not present

## 2015-02-17 ENCOUNTER — Ambulatory Visit: Payer: Medicare Other | Admitting: Internal Medicine

## 2015-03-04 ENCOUNTER — Other Ambulatory Visit: Payer: Self-pay | Admitting: *Deleted

## 2015-03-04 DIAGNOSIS — I779 Disorder of arteries and arterioles, unspecified: Secondary | ICD-10-CM

## 2015-03-04 DIAGNOSIS — I739 Peripheral vascular disease, unspecified: Principal | ICD-10-CM

## 2015-03-07 DIAGNOSIS — J449 Chronic obstructive pulmonary disease, unspecified: Secondary | ICD-10-CM | POA: Diagnosis not present

## 2015-04-06 DIAGNOSIS — J449 Chronic obstructive pulmonary disease, unspecified: Secondary | ICD-10-CM | POA: Diagnosis not present

## 2015-04-09 ENCOUNTER — Encounter: Payer: Self-pay | Admitting: Cardiovascular Disease

## 2015-04-09 ENCOUNTER — Ambulatory Visit (HOSPITAL_COMMUNITY): Payer: Medicare Other | Attending: Cardiovascular Disease

## 2015-04-09 ENCOUNTER — Ambulatory Visit (INDEPENDENT_AMBULATORY_CARE_PROVIDER_SITE_OTHER): Payer: Medicare Other | Admitting: Cardiovascular Disease

## 2015-04-09 VITALS — BP 130/78 | HR 84 | Ht 61.0 in | Wt 125.4 lb

## 2015-04-09 DIAGNOSIS — F17201 Nicotine dependence, unspecified, in remission: Secondary | ICD-10-CM | POA: Diagnosis not present

## 2015-04-09 DIAGNOSIS — I779 Disorder of arteries and arterioles, unspecified: Secondary | ICD-10-CM

## 2015-04-09 DIAGNOSIS — I10 Essential (primary) hypertension: Secondary | ICD-10-CM | POA: Diagnosis not present

## 2015-04-09 DIAGNOSIS — I6523 Occlusion and stenosis of bilateral carotid arteries: Secondary | ICD-10-CM | POA: Insufficient documentation

## 2015-04-09 DIAGNOSIS — I739 Peripheral vascular disease, unspecified: Secondary | ICD-10-CM

## 2015-04-09 DIAGNOSIS — I25110 Atherosclerotic heart disease of native coronary artery with unstable angina pectoris: Secondary | ICD-10-CM | POA: Diagnosis not present

## 2015-04-09 MED ORDER — NITROGLYCERIN 0.4 MG SL SUBL: 0.4000 mg | SUBLINGUAL_TABLET | SUBLINGUAL | Status: AC | PRN

## 2015-04-09 NOTE — Patient Instructions (Signed)
Medication Instructions:  Your physician recommends that you continue on your current medications as directed. Please refer to the Current Medication list given to you today.   Labwork: none  Testing/Procedures: Your physician has requested that you have a lexiscan myoview. For further information please visit [email or web address]. Please follow instruction sheet, as given.    Follow-Up: Your physician wants you to follow-up in: 6 months.  You will receive a reminder letter in the mail two months in advance. If you don't receive a letter, please call our office to schedule the follow-up appointment.

## 2015-04-09 NOTE — Progress Notes (Signed)
Chief Complaint  Patient presents with  . Follow-up     History of Present Illness: 74 yo WF with prior history of HTN, HLD, COPD and CAD status post stenting of right coronary artery in 2005 and LAD in 2006 here for cardiac follow up. She has been followed in the past by Dr. Haroldine Laws. She underwent cardiac catheterization on July 09, 2010 due to CP, showed patent stents to the LAD and RCA with nonobstructive disease throughout. It was felt the majority of her symptoms were probably due to COPD and she was counseled to stop smoking. She was sent home on Imdur. The patient said she became extremely dizzy after she took one dose and had to stop it. Referred to pulmonary. Saw Dr. Annamaria Boots. PFTs with severe COPD. She has not tolerated beta blockers due to fatigue. She has not tolerated statins due to arm and leg pains. Carotid dopplers today with stable 40-59% bilateral ICA stenosis.   She is here today for follow up. She has had chest pressure with rest and exertion. Central chest pressure described as squeezing. Breathing is at baseline. She wears supplemental oxygen. No symptoms of heart failure. No change in weight. No palpitations.   Primary Care Physician: [facility]  Last Lipid Profile:Lipid Panel     Component Value Date/Time   CHOL 149 08/17/2012 1059   TRIG 227.0* 08/17/2012 1059   HDL 51.70 08/17/2012 1059   CHOLHDL 3 08/17/2012 1059   VLDL 45.4* 08/17/2012 1059    Past Medical History  Diagnosis Date  . CAD (coronary artery disease)     Stent RCA 2005, stent LAD 2006  . Hypertension   . Hyperlipemia   . COPD (chronic obstructive pulmonary disease)   . Tobacco abuse   . Fatigue   . Shortness of breath   . Myocardial infarction   . Heart murmur   . Recurrent upper respiratory infection (URI)   . Depression   . Carotid artery occlusion   . Diabetes mellitus without complication     Past Surgical History  Procedure Laterality Date  . Coronary stent  placement      drug-eluting; right coronary artery  . Vesicovaginal fistula closure w/ tah    . Coronary angiography  Nov 25, 2004    CYPHER stenting, left anterior descending  . Cardiac catheterization  2011    Current Outpatient Prescriptions  Medication Sig Dispense Refill  . albuterol (PROAIR HFA) 108 (90 BASE) MCG/ACT inhaler Inhale 2 puffs into the lungs 4 (four) times daily as needed. Rescue inhaler 1 Inhaler prn  . albuterol (PROVENTIL) (2.5 MG/3ML) 0.083% nebulizer solution Take 3 mLs (2.5 mg total) by nebulization every 6 (six) hours as needed for wheezing. 75 mL 6  . Albuterol Sulfate (PROAIR RESPICLICK) 400 (90 BASE) MCG/ACT AEPB Inhale 2 puffs into the lungs every 6 (six) hours as needed. 1 each 11  . Ascorbic Acid (VITAMIN C) 1000 MG tablet Take 1,000 mg by mouth daily.    Marland Kitchen aspirin EC 81 MG tablet Take 81 mg by mouth daily.    . Biotin 1000 MCG tablet Take 1,000 mcg by mouth daily.    . calcium citrate-vitamin D 200-200 MG-UNIT TABS Take 1 tablet by mouth daily.    Marland Kitchen CINNAMON PO Take 2,000 tablets by mouth daily.    . clonazePAM (KLONOPIN) 1 MG tablet Take 1 mg by mouth 2 (two) times daily as needed. For anxiety.    . Fluticasone-Salmeterol (ADVAIR) 250-50 MCG/DOSE AEPB Inhale  1 puff into the lungs every 12 (twelve) hours. 60 each prn  . metFORMIN (GLUCOPHAGE) 500 MG tablet Take 500 mg by mouth 2 (two) times daily.  5  . Multiple Vitamin (MULTIVITAMIN) tablet Take 1 tablet by mouth daily.    . nitroGLYCERIN (NITROSTAT) 0.4 MG SL tablet Place 1 tablet (0.4 mg total) under the tongue every 5 (five) minutes as needed for chest pain. 25 tablet 6  . tiotropium (SPIRIVA HANDIHALER) 18 MCG inhalation capsule Inhale 1 daily 30 capsule prn  . triamcinolone cream (KENALOG) 0.1 % APPLY TO ITCH AREAS 2 TO 3 TIMES DAILY  0   No current facility-administered medications for this visit.    Allergies  Allergen Reactions  . Bactrim [Sulfamethoxazole-Trimethoprim]     nausea  . Codeine    . Vytorin [Ezetimibe-Simvastatin] Other (See Comments)    History   Social History  . Marital Status: Married    Spouse Name: N/A  . Number of Children: 3  . Years of Education: N/A   Occupational History  . Retired Administrator   Social History Main Topics  . Smoking status: Former Smoker -- 0.25 packs/day for 50 years    Types: Cigarettes    Quit date: 02/15/2012  . Smokeless tobacco: Never Used     Comment: started smoking at age 38/// Pt is currently trying to quit smoking, using the electronic cigs, not longer smoking tobacco  . Alcohol Use: Yes     Comment: occasional wine  . Drug Use: No  . Sexual Activity: Not Currently    Birth Control/ Protection: Post-menopausal   Other Topics Concern  . Not on file   Social History Narrative   Husband smokes cigars    Family History  Problem Relation Age of Onset  . Diabetes Mother     deceased  . Heart disease Mother   . Alzheimer's disease Father     deceased  . Diabetes Sister   . Heart attack Sister     Review of Systems:  As stated in the HPI and otherwise negative.   BP 130/78 mmHg  Pulse 84  Ht 5\' 1"  (1.549 m)  Wt 125 lb 6.4 oz (56.881 kg)  BMI 23.71 kg/m2  SpO2 98%  Physical Examination: General: Well developed, well nourished, NAD HEENT: OP clear, mucus membranes moist SKIN: warm, dry. No rashes. Neuro: No focal deficits Musculoskeletal: Muscle strength 5/5 all ext Psychiatric: Mood and affect normal Neck: No JVD, no carotid bruits, no thyromegaly, no lymphadenopathy. Lungs:Clear bilaterally, no wheezes, rhonci, crackles Cardiovascular: Regular rate and rhythm. No murmurs, gallops or rubs. Abdomen:Soft. Bowel sounds present. Non-tender.  Extremities: No lower extremity edema. Pulses are 2 + in the bilateral DP/PT.  Carotid artery dopplers: 04/09/15: 40-59% bilateral stenosis. Patent vertebral   EKG:  EKG is not ordered today. The ekg ordered today demonstrates   Recent  Labs: No results found for requested labs within last 365 days.   Lipid Panel    Component Value Date/Time   CHOL 149 08/17/2012 1059   TRIG 227.0* 08/17/2012 1059   HDL 51.70 08/17/2012 1059   CHOLHDL 3 08/17/2012 1059   VLDL 45.4* 08/17/2012 1059   LDLDIRECT 67.3 08/17/2012 1059     Wt Readings from Last 3 Encounters:  04/09/15 125 lb 6.4 oz (56.881 kg)  12/19/14 125 lb 3.2 oz (56.79 kg)  11/03/14 127 lb 3.2 oz (57.698 kg)     Other studies Reviewed: Additional studies/ records that were reviewed  today include: . Review of the above records demonstrates:    Assessment and Plan:   1. TOBACCO ABUSE, in remission: She has stopped smoking in May 2013.   2. Palpitations: No recurrence. Holding beta blocker secondary to fatigue.   3. CAD/Unstable angina: Recent chest pain concerning for angina. Will arrange Lexiscan stress myoview to exclude ischemia. Continue current meds.   4. CAROTID ARTERY DISEASE : Stable by dopplers today.    5. HTN: BP controlled. No changes   Current medicines are reviewed at length with the patient today.  The patient does not have concerns regarding medicines.  The following changes have been made:  no change  Labs/ tests ordered today include:   Orders Placed This Encounter  Procedures  . Myocardial Perfusion Imaging    Disposition:   FU with me  in 8 weeks  Signed, Lauree Chandler, MD 04/09/2015 5:23 PM    [address], [place], [place]  [address] Phone: [phone]; Fax: [phone]

## 2015-04-20 ENCOUNTER — Encounter: Payer: Self-pay | Admitting: Internal Medicine

## 2015-04-20 ENCOUNTER — Telehealth (HOSPITAL_COMMUNITY): Payer: Self-pay | Admitting: *Deleted

## 2015-04-20 NOTE — Telephone Encounter (Signed)
Patient given detailed instructions per Myocardial Perfusion Study Information Sheet for test on 04/22/15 at 0815. Patient Notified to arrive 15 minutes early, and that it is imperative to arrive on time for appointment to keep from having the test rescheduled. Patient verbalized understanding. Keyonna Comunale, [place]

## 2015-04-21 DIAGNOSIS — E784 Other hyperlipidemia: Secondary | ICD-10-CM | POA: Diagnosis not present

## 2015-04-21 DIAGNOSIS — I2510 Atherosclerotic heart disease of native coronary artery without angina pectoris: Secondary | ICD-10-CM | POA: Diagnosis not present

## 2015-04-21 DIAGNOSIS — E1165 Type 2 diabetes mellitus with hyperglycemia: Secondary | ICD-10-CM | POA: Diagnosis not present

## 2015-04-21 DIAGNOSIS — J440 Chronic obstructive pulmonary disease with acute lower respiratory infection: Secondary | ICD-10-CM | POA: Diagnosis not present

## 2015-04-22 ENCOUNTER — Ambulatory Visit (HOSPITAL_COMMUNITY): Payer: Medicare Other | Attending: Cardiovascular Disease

## 2015-04-22 VITALS — Ht 61.0 in | Wt 125.0 lb

## 2015-04-22 DIAGNOSIS — I25110 Atherosclerotic heart disease of native coronary artery with unstable angina pectoris: Secondary | ICD-10-CM | POA: Diagnosis not present

## 2015-04-22 DIAGNOSIS — R0602 Shortness of breath: Secondary | ICD-10-CM

## 2015-04-22 DIAGNOSIS — R9439 Abnormal result of other cardiovascular function study: Secondary | ICD-10-CM | POA: Diagnosis not present

## 2015-04-22 LAB — MYOCARDIAL PERFUSION IMAGING
CHL CUP NUCLEAR SRS: 10
LV sys vol: 48 mL
LVDIAVOL: 81 mL
Peak HR: 110 {beats}/min
RATE: 0.27
Rest HR: 83 {beats}/min
SDS: 0
SSS: 10
TID: 1.01

## 2015-04-22 MED ORDER — TECHNETIUM TC 99M SESTAMIBI GENERIC - CARDIOLITE
30.4000 | Freq: Once | INTRAVENOUS | Status: AC | PRN
  Administered 2015-04-22: 30 via INTRAVENOUS

## 2015-04-22 MED ORDER — AMINOPHYLLINE 25 MG/ML IV SOLN
75.0000 mg | Freq: Once | INTRAVENOUS | Status: AC
  Administered 2015-04-22: 75 mg via INTRAVENOUS

## 2015-04-22 MED ORDER — REGADENOSON 0.4 MG/5ML IV SOLN
0.4000 mg | Freq: Once | INTRAVENOUS | Status: AC
  Administered 2015-04-22: 0.4 mg via INTRAVENOUS

## 2015-04-22 MED ORDER — TECHNETIUM TC 99M SESTAMIBI GENERIC - CARDIOLITE
9.7000 | Freq: Once | INTRAVENOUS | Status: AC | PRN
  Administered 2015-04-22: 10 via INTRAVENOUS

## 2015-05-07 DIAGNOSIS — J449 Chronic obstructive pulmonary disease, unspecified: Secondary | ICD-10-CM | POA: Diagnosis not present

## 2015-05-15 ENCOUNTER — Telehealth: Payer: Self-pay | Admitting: Internal Medicine

## 2015-05-15 MED ORDER — AMOXICILLIN-POT CLAVULANATE 500-125 MG PO TABS: 1.0000 | ORAL_TABLET | Freq: Two times a day (BID) | ORAL | Status: DC

## 2015-05-15 MED ORDER — PREDNISONE 10 MG PO TABS: ORAL_TABLET | ORAL | Status: DC

## 2015-05-15 NOTE — Telephone Encounter (Signed)
Called and spoke to pt's daughter. Informed her of the recs per CY. Rx sent to preferred pharmacy. Pt's daughter verbalized understanding and also questions if pt would be a good candidate for the non-invasive ventilator.   Dr. Annamaria Boots please advise. Thanks.

## 2015-05-15 NOTE — Telephone Encounter (Signed)
Prednisone 10 mg, # 20, 4 X 2 DAYS, 3 X 2 DAYS, 2 X 2 DAYS, 1 X 2 DAYS Augmentin 500, # 14, 1 twice daily

## 2015-05-15 NOTE — Telephone Encounter (Signed)
Please have patient come in on Friday 06-05-15 at 11:30am slot. Thanks.

## 2015-05-15 NOTE — Telephone Encounter (Signed)
Spoke with daughter [name]- For the past 4 days pt is having increased chest congestion, unable to get mucus up. Increased SOB and wheezing. Little coughing.  Denies fever. Requesting something be called in or OTC meds.   Pt also needs an appt for ROV in the next couple weeks and the schedule is booked up. Please advise where the patient can be worked in. Thanks.   Please advise Dr Annamaria Boots. Thanks.    Medication List       This list is accurate as of: 05/15/15 10:12 AM.  Always use your most recent med list.               albuterol 108 (90 BASE) MCG/ACT inhaler  Commonly known as:  PROAIR HFA  Inhale 2 puffs into the lungs 4 (four) times daily as needed. Rescue inhaler     albuterol (2.5 MG/3ML) 0.083% nebulizer solution  Commonly known as:  PROVENTIL  Take 3 mLs (2.5 mg total) by nebulization every 6 (six) hours as needed for wheezing.     Albuterol Sulfate 108 (90 BASE) MCG/ACT Aepb  Commonly known as:  PROAIR RESPICLICK  Inhale 2 puffs into the lungs every 6 (six) hours as needed.     aspirin EC 81 MG tablet  Take 81 mg by mouth daily.     Biotin 1000 MCG tablet  Take 1,000 mcg by mouth daily.     calcium citrate-vitamin D 200-200 MG-UNIT Tabs  Take 1 tablet by mouth daily.     CINNAMON PO  Take 2,000 tablets by mouth daily.     clonazePAM 1 MG tablet  Commonly known as:  KLONOPIN  Take 1 mg by mouth 2 (two) times daily as needed. For anxiety.     Fluticasone-Salmeterol 250-50 MCG/DOSE Aepb  Commonly known as:  ADVAIR  Inhale 1 puff into the lungs every 12 (twelve) hours.     metFORMIN 500 MG tablet  Commonly known as:  GLUCOPHAGE  Take 500 mg by mouth 2 (two) times daily.     multivitamin tablet  Take 1 tablet by mouth daily.     nitroGLYCERIN 0.4 MG SL tablet  Commonly known as:  NITROSTAT  Place 1 tablet (0.4 mg total) under the tongue every 5 (five) minutes as needed for chest pain.     tiotropium 18 MCG inhalation capsule  Commonly known as:  SPIRIVA  HANDIHALER  Inhale 1 daily     triamcinolone cream 0.1 %  Commonly known as:  KENALOG  APPLY TO ITCH AREAS 2 TO 3 TIMES DAILY     vitamin C 1000 MG tablet  Take 1,000 mg by mouth daily.        Allergies  Allergen Reactions  . Bactrim [Sulfamethoxazole-Trimethoprim]     nausea  . Codeine   . Vytorin [Ezetimibe-Simvastatin] Other (See Comments)

## 2015-05-15 NOTE — Telephone Encounter (Signed)
[name]- Is there an opening for patient next 2-3 weeks? We can talk some about non-invasive assisted ventilation at next visit.

## 2015-05-15 NOTE — Telephone Encounter (Signed)
Called spoke with [name] and scheduled appt. Nothing further needed

## 2015-05-15 NOTE — Telephone Encounter (Signed)
Patient's daughter also wants to make an appt for patient within the next few weeks.  Schedule is full.

## 2015-06-05 ENCOUNTER — Ambulatory Visit (INDEPENDENT_AMBULATORY_CARE_PROVIDER_SITE_OTHER): Payer: Medicare Other | Admitting: Internal Medicine

## 2015-06-05 ENCOUNTER — Ambulatory Visit (INDEPENDENT_AMBULATORY_CARE_PROVIDER_SITE_OTHER)
Admission: RE | Admit: 2015-06-05 | Discharge: 2015-06-05 | Disposition: A | Discharge status: Home / Self Care | Payer: Medicare Other | Source: Ambulatory Visit | Attending: Internal Medicine | Admitting: Internal Medicine

## 2015-06-05 ENCOUNTER — Encounter: Payer: Self-pay | Admitting: Internal Medicine

## 2015-06-05 VITALS — BP 114/70 | HR 82 | Ht 61.0 in | Wt 127.8 lb

## 2015-06-05 DIAGNOSIS — Z23 Encounter for immunization: Secondary | ICD-10-CM

## 2015-06-05 DIAGNOSIS — F17201 Nicotine dependence, unspecified, in remission: Secondary | ICD-10-CM

## 2015-06-05 DIAGNOSIS — J449 Chronic obstructive pulmonary disease, unspecified: Secondary | ICD-10-CM

## 2015-06-05 DIAGNOSIS — M25512 Pain in left shoulder: Secondary | ICD-10-CM | POA: Diagnosis not present

## 2015-06-05 DIAGNOSIS — R079 Chest pain, unspecified: Secondary | ICD-10-CM | POA: Diagnosis not present

## 2015-06-05 DIAGNOSIS — Z720 Tobacco use: Secondary | ICD-10-CM | POA: Diagnosis not present

## 2015-06-05 NOTE — Assessment & Plan Note (Signed)
Is not smoking at all

## 2015-06-05 NOTE — Progress Notes (Signed)
Patient ID: DENYLA CORTESE, female    DOB: [birth year], 74 y.o.   MRN: [id number]  HPI 03/21/11- 80 yoF followed for COPD, ongoing tobacco use, complicated by CAD Last here Nov 18, 2010- note reviewed.  Continues in a Spiriva drug trial CXR 01/25/11 - stable COPD, NAD She continues to find that sleeping with oxygen makes her feel much better. Continues Advair and Spiriva regularly. Rare need for rescue inhaler.  07/08/11 62 yoF followed for COPD, ongoing tobacco use, complicated by CAD, DM   Husband and daughter here Acute visit- Dyspnea   She complains of feeling somehow bad with more short of breath particularly in the last 2-3 weeks. Her husband says she has been steadily declining, weaker, for 4 or 5 months. She complains of pain in her left bicep if she tries to reach around to her back but this is not exertional pain otherwise and she denies angina-type pain or pleuritic pain. Mainly she is complaining of shortness of breath. There is some increased cough with scant clear mucus. She gets a burning sensation in the upper sternum just in the last few days. Some nausea last week. She was seen at her primary care office 4 days ago , given a Rocephin injection and started on Levaquin. Chest x-ray was done and she was told it was clear. She denies swollen glands, rash, nausea or vomiting, change in bowel or bladder , swelling or aching in legs. EKG- RSR, RAE, NAD  07/15/11-69 yoF followed for COPD, ongoing tobacco use, complicated by CAD, DM..........Marland Kitchenhusband here Much stronger and feeling much better than last time. Has reduced but not quit smoking-discussed. Notes desat walking through home on room air to as low as 83%. Discussed use of O2 for goal sat >/= 88% and discussed CO2 retention. Coughs up a little thick clear mucus.   10/31/11- 82 yoF followed for COPD, ongoing tobacco use, complicated by CAD, DM..........Marland Kitchenhusband here In past week just gotten over the breathing concerns since seen last  time and in December (when had flu like symptoms); Has been using O2 more-having low O2 levels when not on O2.(low as 75%). Unfortunately she has continued to smoke a few cigarettes daily against advice. She has been dealing with a sustained exacerbation of COPD really since October. Using oxygen continuously between 2 and 3 L per minute/Advanced. We talked about pulmonary rehabilitation. She never took the invitation to try the [facility] program. She said then that she would [facility] but she didn't do that either. She paces herself at home wearing oxygen, to do limited housework. She can move a vacuum cleaner but not move furniture. Bothersome pain in the left shoulder with decreased range of motion. Little cough, phlegm, exertional chest pain, edema. No blood. PFT-09/06/2010-reviewed again with her and her husband. Very severe obstructive airways disease, emphysema pattern, FEV1/FVC 0.28, DLCO 42%. She has a home nebulizer machine with albuterol and that works better for her than metered-dose inhalers.  01/30/12- 78 yoF followed for COPD, ongoing tobacco use, complicated by CAD, DM..........Marland Kitchenhusband here  Pt states breathing is worsening, increased sob. Pt is wearing O2 24/7 at 2-3LPM, pt states she is trying to quit smoking and is currently using the electronic cigarrette rather than the tobacco. She is worrking toward quitting. Pt states mucus is increased, green-yellow phlegm Now with 2 days of green sputum and watery rhinorrhea without sore throat or fever. She restarted Spiriva   CXR 07/04/11- IMPRESSION:  COPD. No  active lung disease.  Original Report Authenticated By: Joretta Bachelor, M.D.    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 Post hospital May 16 through May 18 for exacerbation of COPD. Daughter here  today. Has finally quit smoking. Shows me some green tinged mucus, not frankly purulent. Registers a CAT score of 11.  05/24/12- 73 yoF followed for COPD, ongoing tobacco use, complicated by CAD, DM..........Marland Kitchenhusband here Has noticed somedays coughing and able to cough something up-green in color but not often; having more "hot flashes" but unware of cause. SOB with activity. Describes occasional "white bumps" on her feet.  02/01/13- 30 yoF followed for COPD, ongoing tobacco use, complicated by CAD, DM..........Marland Kitchenhusband here FOLLOWS FOR: patient states she has good days and bad days w her breahting, has had some green mucus throughout the day,decreased energy level x1week . has increased o2 to 3L at all times x3weeks--off tudorza x1 wk (ran out) Home O2 3 L with activity, 2.5 L at rest/Advanced. Quit smoking one year ago CXR 8/27/ 13 IMPRESSION:  COPD with hyperinflation. No acute cardiopulmonary disease.  Original Report Authenticated By: Truett Perna, M.D.  08/20/13- 85 yoF former smoker followed for COPD,  complicated by CAD, DM..........Marland Kitchenhusband here Home O2 3 L  at rest/Advanced. FOLLOWS FOR: Pt states she has been doing well since last visit; has not been sick either. She feels better-noticing that she was able to walk in from parking lot through cold air by herself. Waiting for portable concentrator. Brother has died of COPD  2014-02-20-  11 yoF former smoker followed for COPD,  complicated by CAD, DM...........daughter here Home O2 3 L /Advanced. FOLLOWS FOR: Pt states some days she has productive cough-green in color and then other days she cant get anything up. Has happened in the last week-tired as well but no fevers. On arrival on 4L pulse, O2 sat 84%, up to 92% on continuous. Had tripped and fractured right ankle 2 months ago-no surgery. Clonazepam not much help for insomnia CXR 08/20/13 Stable cardiac and mediastinal contours. Lungs remain hyperinflated.  No large  consolidative pulmonary opacities. No pleural effusion or  pneumothorax. Regional skeleton is unremarkable.  IMPRESSION:  Stable chest radiograph.  Electronically Signed  By: Lovey Newcomer M.D.  On: 08/20/2013 13:51   08/19/14- 46 yoF former smoker followed for COPD,  complicated by CAD, DM..........Marland Kitchenhusband here    Had flu vax FOLLOWS FOR: Continues to have SOB with exertion if O2 2-3L is not up enough; DME is [facility]. Occ up at night for neb. DOE. No acute event or fluid retention, chest pain or palpitation.  11/03/2014 Acute OV -former smoker followed for COPD,  complicated by CAD, DM. On home O2 at 2-3l/m . Remains on Spiriva and Advair.  Complains of CY pt here for increased SOB, dry cough, wheezing/rattling at night, decreased energy x4-5days.  denies f/c/s, hemoptysis, PND or leg swelling.   Began Augmentin 1/29 x7days No hemoptysis , chest pian, orthopnea, or edema.   12/19/14-  Acute OV -former smoker followed for COPD,  complicated by CAD, DM.   Husband here FOLLOWS FOR: Pt having good and bad days with breathing.  Pt has noticed pain in right side and back for about 1 week ago. O2 2-3L/M Advanced Sputum sometimes gray or green with no blood, fever or sweat. She has not been using Advair routinely.  When shortness of breath gets worse she will use her nebulizer 3 or 4 times a day. Has had sharp "grabbing" pains right lateral chest intermittently-not really pleuritic and not clearly related to twisting or bending, x3 days. CXR 08/20/14 IMPRESSION: 1. COPD. Pleural parenchymal scarring . 2. No acute abnormality. Electronically Signed  By: Marcello Moores Register  On: 08/20/2014 08:56  06/05/15- 4 yoF former smoker followed for COPD,  complicated by CAD/ stent/MI, DM..........Marland Kitchenhusband here    FOLLOWS FOR: Still has good and bad days due to weather changes.  Had hard time with extreme heat. Pt is having pain with breathing on left upper side of back and comes around and down her left arm for  past several days. O2 2-3L/M Advanced After using a back massage device 7-10 days ago she began having pain centered on the left scapula but up into the left side of her neck and left upper arm to elbow. Not affected by moving the arm or shoulder. Nonexertional. Pain came on once while lying in bed and seemed relieved by taking 1 standard aspirin. She has not tried nitroglycerin. CXR - 12/19/14 IMPRESSION: Stable chronic lung disease. No superimposed acute findings are identified. Electronically Signed  By: Genevie Ann M.D.  On: 12/19/2014 12:01  ROS-see HPI   Negative unless "+" Constitutional:    weight loss, night sweats, fevers, chills, fatigue, lassitude. HEENT:    headaches, difficulty swallowing, tooth/dental problems, sore throat,       sneezing, itching, ear ache, nasal congestion, post nasal drip, snoring CV:    +chest pain, orthopnea, PND, swelling in lower extremities, anasarca,                                                             dizziness, palpitations Resp:   +shortness of breath with exertion or at rest.                productive cough,   +non-productive cough, coughing up of blood.              change in color of mucus.  wheezing.   Skin:    rash or lesions. GI:  No-   heartburn, indigestion, abdominal pain, nausea, vomiting,  GU: . MS:   joint pain, stiffness, . Neuro-     nothing unusual Psych:  change in mood or affect.  depression or anxiety.   memory loss.  OBJ- Physical Exam   O2 3L General- Alert, Oriented, Affect-appropriate, Distress- none acute Skin- rash-none, lesions- none, excoriation- none Lymphadenopathy- none Head- atraumatic            Eyes- Gross vision intact, PERRLA, conjunctivae and secretions clear            Ears- Hearing, canals-normal            Nose- Clear, no-Septal dev, mucus, polyps, erosion, perforation             Throat- Mallampati II , mucosa clear , drainage- none, tonsils- atrophic Neck- flexible , trachea midline, no stridor  , thyroid nl, carotid no bruit Chest - symmetrical excursion , unlabored           Heart/CV- RRR , no murmur , no gallop  , no rub, nl s1 s2                           -  JVD- none , edema- none, stasis changes- none, varices- none           Lung- clear to P&A/very distant, unlabored,  wheeze- none, cough- none , dullness-none, rub- none           Chest wall- no rub, no tenderness to light pressure Abd-  Br/ Gen/ Rectal- Not done, not indicated Extrem- cyanosis- none, clubbing, none, atrophy- none, strength- nl Neuro- grossly intact to observation

## 2015-06-05 NOTE — Assessment & Plan Note (Signed)
I favor this being musculoskeletal pain rather than angina but did suggest she try a nitroglycerin once so she could reported to her cardiologist if appropriate. Plan-try aspirin and local heat, try nitroglycerin. Report to cardiologist if  pain continues. Brachial plexus inflammation might cause symptoms like this so we will check chest x-ray.

## 2015-06-05 NOTE — Patient Instructions (Signed)
Flu vax  Order- CXR    Dx Left upper chest and left shoulder pain, COPD mixed type   Suggest you try full dose aspirin and/ or nitroglycerin for the shoulder pain so you can report to your cardiologist  We discussed Breathe Right strips for stuffy nose snoring

## 2015-06-05 NOTE — Assessment & Plan Note (Signed)
Bronchitis component is well controlled at present, leaving significant underlying emphysema. Plan-flu vaccine, chest x-ray

## 2015-06-07 DIAGNOSIS — J449 Chronic obstructive pulmonary disease, unspecified: Secondary | ICD-10-CM | POA: Diagnosis not present

## 2015-06-11 NOTE — Progress Notes (Signed)
Quick Note:  Called and spoke with pt. Reviewed results and recs. Pt voiced understanding and had no further questions. ______ 

## 2015-06-26 ENCOUNTER — Ambulatory Visit: Payer: Medicare Other | Admitting: Internal Medicine

## 2015-07-07 DIAGNOSIS — J449 Chronic obstructive pulmonary disease, unspecified: Secondary | ICD-10-CM | POA: Diagnosis not present

## 2015-07-22 DIAGNOSIS — J449 Chronic obstructive pulmonary disease, unspecified: Secondary | ICD-10-CM | POA: Diagnosis not present

## 2015-07-22 DIAGNOSIS — E119 Type 2 diabetes mellitus without complications: Secondary | ICD-10-CM | POA: Diagnosis not present

## 2015-07-22 DIAGNOSIS — Z9981 Dependence on supplemental oxygen: Secondary | ICD-10-CM | POA: Diagnosis not present

## 2015-08-07 DIAGNOSIS — J449 Chronic obstructive pulmonary disease, unspecified: Secondary | ICD-10-CM | POA: Diagnosis not present

## 2015-08-23 ENCOUNTER — Other Ambulatory Visit: Payer: Self-pay | Admitting: Internal Medicine

## 2015-09-03 ENCOUNTER — Other Ambulatory Visit: Payer: Self-pay | Admitting: Internal Medicine

## 2015-09-06 DIAGNOSIS — J449 Chronic obstructive pulmonary disease, unspecified: Secondary | ICD-10-CM | POA: Diagnosis not present

## 2015-09-10 DIAGNOSIS — E119 Type 2 diabetes mellitus without complications: Secondary | ICD-10-CM | POA: Diagnosis not present

## 2015-09-10 DIAGNOSIS — Z9981 Dependence on supplemental oxygen: Secondary | ICD-10-CM | POA: Diagnosis not present

## 2015-09-10 DIAGNOSIS — J449 Chronic obstructive pulmonary disease, unspecified: Secondary | ICD-10-CM | POA: Diagnosis not present

## 2015-09-10 DIAGNOSIS — E784 Other hyperlipidemia: Secondary | ICD-10-CM | POA: Diagnosis not present

## 2015-09-10 DIAGNOSIS — Z87891 Personal history of nicotine dependence: Secondary | ICD-10-CM | POA: Diagnosis not present

## 2015-09-10 DIAGNOSIS — Z0000 Encounter for general adult medical examination without abnormal findings: Secondary | ICD-10-CM | POA: Diagnosis not present

## 2015-09-23 ENCOUNTER — Other Ambulatory Visit: Payer: Self-pay

## 2015-09-23 DIAGNOSIS — Z1231 Encounter for screening mammogram for malignant neoplasm of breast: Secondary | ICD-10-CM

## 2015-10-07 DIAGNOSIS — J449 Chronic obstructive pulmonary disease, unspecified: Secondary | ICD-10-CM | POA: Diagnosis not present

## 2015-10-12 ENCOUNTER — Encounter (HOSPITAL_COMMUNITY): Payer: Self-pay | Admitting: Emergency Medicine

## 2015-10-12 ENCOUNTER — Telehealth: Payer: Self-pay | Admitting: Internal Medicine

## 2015-10-12 ENCOUNTER — Inpatient Hospital Stay (HOSPITAL_COMMUNITY): Payer: Medicare Other

## 2015-10-12 ENCOUNTER — Emergency Department (HOSPITAL_COMMUNITY): Payer: Medicare Other

## 2015-10-12 ENCOUNTER — Inpatient Hospital Stay (HOSPITAL_COMMUNITY)
Admission: EM | Admit: 2015-10-12 | Discharge: 2015-10-19 | DRG: 208 | Disposition: A | Discharge status: Home / Self Care | Payer: Medicare Other | Attending: Emergency Medicine | Admitting: Emergency Medicine

## 2015-10-12 DIAGNOSIS — I252 Old myocardial infarction: Secondary | ICD-10-CM

## 2015-10-12 DIAGNOSIS — J9602 Acute respiratory failure with hypercapnia: Secondary | ICD-10-CM

## 2015-10-12 DIAGNOSIS — J9620 Acute and chronic respiratory failure, unspecified whether with hypoxia or hypercapnia: Secondary | ICD-10-CM | POA: Diagnosis present

## 2015-10-12 DIAGNOSIS — J449 Chronic obstructive pulmonary disease, unspecified: Secondary | ICD-10-CM

## 2015-10-12 DIAGNOSIS — Z955 Presence of coronary angioplasty implant and graft: Secondary | ICD-10-CM

## 2015-10-12 DIAGNOSIS — F419 Anxiety disorder, unspecified: Secondary | ICD-10-CM | POA: Diagnosis present

## 2015-10-12 DIAGNOSIS — J151 Pneumonia due to Pseudomonas: Secondary | ICD-10-CM | POA: Diagnosis not present

## 2015-10-12 DIAGNOSIS — J189 Pneumonia, unspecified organism: Secondary | ICD-10-CM | POA: Diagnosis not present

## 2015-10-12 DIAGNOSIS — J9690 Respiratory failure, unspecified, unspecified whether with hypoxia or hypercapnia: Secondary | ICD-10-CM | POA: Diagnosis not present

## 2015-10-12 DIAGNOSIS — I2510 Atherosclerotic heart disease of native coronary artery without angina pectoris: Secondary | ICD-10-CM | POA: Diagnosis present

## 2015-10-12 DIAGNOSIS — Z7982 Long term (current) use of aspirin: Secondary | ICD-10-CM

## 2015-10-12 DIAGNOSIS — Z4682 Encounter for fitting and adjustment of non-vascular catheter: Secondary | ICD-10-CM | POA: Diagnosis not present

## 2015-10-12 DIAGNOSIS — J9691 Respiratory failure, unspecified with hypoxia: Secondary | ICD-10-CM | POA: Diagnosis present

## 2015-10-12 DIAGNOSIS — Z87891 Personal history of nicotine dependence: Secondary | ICD-10-CM

## 2015-10-12 DIAGNOSIS — J9621 Acute and chronic respiratory failure with hypoxia: Secondary | ICD-10-CM | POA: Diagnosis not present

## 2015-10-12 DIAGNOSIS — E785 Hyperlipidemia, unspecified: Secondary | ICD-10-CM | POA: Diagnosis present

## 2015-10-12 DIAGNOSIS — Z4659 Encounter for fitting and adjustment of other gastrointestinal appliance and device: Secondary | ICD-10-CM

## 2015-10-12 DIAGNOSIS — Z9981 Dependence on supplemental oxygen: Secondary | ICD-10-CM | POA: Diagnosis not present

## 2015-10-12 DIAGNOSIS — I10 Essential (primary) hypertension: Secondary | ICD-10-CM | POA: Diagnosis present

## 2015-10-12 DIAGNOSIS — B974 Respiratory syncytial virus as the cause of diseases classified elsewhere: Secondary | ICD-10-CM | POA: Diagnosis present

## 2015-10-12 DIAGNOSIS — J9692 Respiratory failure, unspecified with hypercapnia: Secondary | ICD-10-CM

## 2015-10-12 DIAGNOSIS — G9340 Encephalopathy, unspecified: Secondary | ICD-10-CM | POA: Diagnosis present

## 2015-10-12 DIAGNOSIS — J440 Chronic obstructive pulmonary disease with acute lower respiratory infection: Principal | ICD-10-CM | POA: Diagnosis present

## 2015-10-12 DIAGNOSIS — E1165 Type 2 diabetes mellitus with hyperglycemia: Secondary | ICD-10-CM | POA: Diagnosis present

## 2015-10-12 DIAGNOSIS — E872 Acidosis: Secondary | ICD-10-CM | POA: Diagnosis not present

## 2015-10-12 DIAGNOSIS — J9601 Acute respiratory failure with hypoxia: Secondary | ICD-10-CM | POA: Diagnosis not present

## 2015-10-12 DIAGNOSIS — J9622 Acute and chronic respiratory failure with hypercapnia: Secondary | ICD-10-CM | POA: Diagnosis present

## 2015-10-12 DIAGNOSIS — R300 Dysuria: Secondary | ICD-10-CM | POA: Diagnosis not present

## 2015-10-12 DIAGNOSIS — D539 Nutritional anemia, unspecified: Secondary | ICD-10-CM | POA: Diagnosis not present

## 2015-10-12 DIAGNOSIS — J441 Chronic obstructive pulmonary disease with (acute) exacerbation: Secondary | ICD-10-CM | POA: Diagnosis not present

## 2015-10-12 DIAGNOSIS — R0682 Tachypnea, not elsewhere classified: Secondary | ICD-10-CM | POA: Diagnosis not present

## 2015-10-12 DIAGNOSIS — R0602 Shortness of breath: Secondary | ICD-10-CM | POA: Diagnosis not present

## 2015-10-12 LAB — BASIC METABOLIC PANEL
ANION GAP: 11 (ref 5–15)
BUN: 10 mg/dL (ref 6–20)
CO2: 36 mmol/L — AB (ref 22–32)
Calcium: 8.7 mg/dL — ABNORMAL LOW (ref 8.9–10.3)
Chloride: 90 mmol/L — ABNORMAL LOW (ref 101–111)
Creatinine, Ser: 0.64 mg/dL (ref 0.44–1.00)
GFR calc Af Amer: >60 mL/min (ref >60)
GFR calc non Af Amer: >60 mL/min (ref >60)
GLUCOSE: 273 mg/dL — AB (ref 65–99)
POTASSIUM: 4.4 mmol/L (ref 3.5–5.1)
Sodium: 137 mmol/L (ref 135–145)

## 2015-10-12 LAB — URINALYSIS, ROUTINE W REFLEX MICROSCOPIC
BILIRUBIN URINE: NEGATIVE
BILIRUBIN URINE: NEGATIVE
Glucose, UA: >1000 mg/dL — AB
HGB URINE DIPSTICK: NEGATIVE
Hgb urine dipstick: NEGATIVE
KETONES UR: 40 mg/dL — AB
Ketones, ur: 40 mg/dL — AB
Leukocytes, UA: NEGATIVE
Leukocytes, UA: NEGATIVE
NITRITE: POSITIVE — AB
Nitrite: NEGATIVE
PH: 5.5 (ref 5.0–8.0)
PROTEIN: NEGATIVE mg/dL
Protein, ur: NEGATIVE mg/dL
SPECIFIC GRAVITY, URINE: 1.033 — AB (ref 1.005–1.030)
Specific Gravity, Urine: 1.027 (ref 1.005–1.030)
pH: 5.5 (ref 5.0–8.0)

## 2015-10-12 LAB — I-STAT ARTERIAL BLOOD GAS, ED
ACID-BASE EXCESS: 4 mmol/L — AB (ref 0.0–2.0)
Acid-Base Excess: 2 mmol/L (ref 0.0–2.0)
Bicarbonate: 34.7 mEq/L — ABNORMAL HIGH (ref 20.0–24.0)
Bicarbonate: 28.9 mEq/L — ABNORMAL HIGH (ref 20.0–24.0)
O2 SAT: 79 %
O2 SAT: 100 %
PCO2 ART: 55 mmHg — AB (ref 35.0–45.0)
PH ART: 7.328 — AB (ref 7.350–7.450)
PO2 ART: 58 mmHg — AB (ref 80.0–100.0)
PO2 ART: 205 mmHg — AB (ref 80.0–100.0)
Patient temperature: 98.6
Patient temperature: 98.6
TCO2: 38 mmol/L (ref 0–100)
TCO2: 31 mmol/L (ref 0–100)
pCO2 arterial: 94.3 mmHg (ref 35.0–45.0)
pH, Arterial: 7.174 — CL (ref 7.350–7.450)

## 2015-10-12 LAB — BLOOD GAS, ARTERIAL
Acid-Base Excess: 5.4 mmol/L — ABNORMAL HIGH (ref 0.0–2.0)
Bicarbonate: 33.8 mEq/L — ABNORMAL HIGH (ref 20.0–24.0)
DRAWN BY: 24487
Delivery systems: POSITIVE
Expiratory PAP: 7
FIO2: 0.4
INSPIRATORY PAP: 15
Mode: POSITIVE
O2 SAT: 95 %
PATIENT TEMPERATURE: 98.6
PCO2 ART: 99.8 mmHg — AB (ref 35.0–45.0)
PO2 ART: 85 mmHg (ref 80.0–100.0)
TCO2: 36.9 mmol/L (ref 0–100)
pH, Arterial: 7.156 — CL (ref 7.350–7.450)

## 2015-10-12 LAB — PHOSPHORUS
Phosphorus: <1 mg/dL — CL (ref 2.5–4.6)
Phosphorus: <1 mg/dL — CL (ref 2.5–4.6)

## 2015-10-12 LAB — INFLUENZA PANEL BY PCR (TYPE A & B)
H1N1 flu by pcr: NOT DETECTED
INFLAPCR: NEGATIVE
INFLBPCR: NEGATIVE

## 2015-10-12 LAB — CBC WITH DIFFERENTIAL/PLATELET
BASOS ABS: 0.1 10*3/uL (ref 0.0–0.1)
Basophils Relative: 1 %
Eosinophils Absolute: 0 10*3/uL (ref 0.0–0.7)
Eosinophils Relative: 0 %
HEMATOCRIT: 38.9 % (ref 36.0–46.0)
Hemoglobin: 12.4 g/dL (ref 12.0–15.0)
LYMPHS ABS: 0.8 10*3/uL (ref 0.7–4.0)
LYMPHS PCT: 8 %
MCH: 33.4 pg (ref 26.0–34.0)
MCHC: 31.9 g/dL (ref 30.0–36.0)
MCV: 104.9 fL — AB (ref 78.0–100.0)
MONO ABS: 0.7 10*3/uL (ref 0.1–1.0)
Monocytes Relative: 8 %
NEUTROS ABS: 8.1 10*3/uL — AB (ref 1.7–7.7)
Neutrophils Relative %: 83 %
Platelets: 180 10*3/uL (ref 150–400)
RBC: 3.71 MIL/uL — AB (ref 3.87–5.11)
RDW: 12.4 % (ref 11.5–15.5)
WBC: 9.7 10*3/uL (ref 4.0–10.5)

## 2015-10-12 LAB — MRSA PCR SCREENING: MRSA BY PCR: NEGATIVE

## 2015-10-12 LAB — LACTIC ACID, PLASMA
LACTIC ACID, VENOUS: 1.2 mmol/L (ref 0.5–2.0)
LACTIC ACID, VENOUS: 3.3 mmol/L — AB (ref 0.5–2.0)
Lactic Acid, Venous: 4.4 mmol/L (ref 0.5–2.0)

## 2015-10-12 LAB — GLUCOSE, CAPILLARY
GLUCOSE-CAPILLARY: 386 mg/dL — AB (ref 65–99)
GLUCOSE-CAPILLARY: 141 mg/dL — AB (ref 65–99)
Glucose-Capillary: 466 mg/dL — ABNORMAL HIGH (ref 65–99)
Glucose-Capillary: 211 mg/dL — ABNORMAL HIGH (ref 65–99)

## 2015-10-12 LAB — I-STAT CG4 LACTIC ACID, ED: LACTIC ACID, VENOUS: 3.53 mmol/L — AB (ref 0.5–2.0)

## 2015-10-12 LAB — URINE MICROSCOPIC-ADD ON

## 2015-10-12 LAB — PROCALCITONIN: PROCALCITONIN: 0.7 ng/mL

## 2015-10-12 LAB — BRAIN NATRIURETIC PEPTIDE: B Natriuretic Peptide: 80.4 pg/mL (ref 0.0–100.0)

## 2015-10-12 LAB — TROPONIN I
Troponin I: <0.03 ng/mL (ref <0.031)
Troponin I: 0.08 ng/mL — ABNORMAL HIGH (ref <0.031)
Troponin I: 0.09 ng/mL — ABNORMAL HIGH (ref <0.031)

## 2015-10-12 LAB — TRIGLYCERIDES: TRIGLYCERIDES: 124 mg/dL (ref <150)

## 2015-10-12 LAB — MAGNESIUM: MAGNESIUM: 2.1 mg/dL (ref 1.7–2.4)

## 2015-10-12 MED ORDER — SODIUM CHLORIDE 0.9 % IV BOLUS (SEPSIS)
1000.0000 mL | Freq: Once | INTRAVENOUS | Status: AC
  Administered 2015-10-12: 1000 mL via INTRAVENOUS

## 2015-10-12 MED ORDER — SODIUM CHLORIDE 0.9 % IV SOLN: Freq: Once | INTRAVENOUS | Status: DC

## 2015-10-12 MED ORDER — PROPOFOL 1000 MG/100ML IV EMUL
INTRAVENOUS | Status: AC
  Filled 2015-10-12: qty 100

## 2015-10-12 MED ORDER — PROPOFOL 1000 MG/100ML IV EMUL
5.0000 ug/kg/min | Freq: Once | INTRAVENOUS | Status: AC
  Administered 2015-10-12: 30 ug/kg/min via INTRAVENOUS

## 2015-10-12 MED ORDER — DEXTROSE 5 % IV SOLN
500.0000 mg | Freq: Once | INTRAVENOUS | Status: AC
  Administered 2015-10-12: 500 mg via INTRAVENOUS
  Filled 2015-10-12: qty 500

## 2015-10-12 MED ORDER — LEVOFLOXACIN IN D5W 750 MG/150ML IV SOLN: 750.0000 mg | INTRAVENOUS | Status: DC

## 2015-10-12 MED ORDER — METHYLPREDNISOLONE SODIUM SUCC 125 MG IJ SOLR
60.0000 mg | Freq: Four times a day (QID) | INTRAMUSCULAR | Status: DC
  Administered 2015-10-12 – 2015-10-13 (×4): 60 mg via INTRAVENOUS
  Filled 2015-10-12 (×3): qty 0.96
  Filled 2015-10-12: qty 2
  Filled 2015-10-12: qty 0.96
  Filled 2015-10-12 (×2): qty 2

## 2015-10-12 MED ORDER — IPRATROPIUM-ALBUTEROL 0.5-2.5 (3) MG/3ML IN SOLN
3.0000 mL | Freq: Four times a day (QID) | RESPIRATORY_TRACT | Status: DC
  Administered 2015-10-12 – 2015-10-19 (×21): 3 mL via RESPIRATORY_TRACT
  Filled 2015-10-12 (×26): qty 3

## 2015-10-12 MED ORDER — LEVOFLOXACIN IN D5W 750 MG/150ML IV SOLN
750.0000 mg | INTRAVENOUS | Status: DC
  Administered 2015-10-13 – 2015-10-15 (×3): 750 mg via INTRAVENOUS
  Filled 2015-10-12 (×6): qty 150

## 2015-10-12 MED ORDER — HEPARIN SODIUM (PORCINE) 5000 UNIT/ML IJ SOLN
5000.0000 [IU] | Freq: Three times a day (TID) | INTRAMUSCULAR | Status: DC
  Administered 2015-10-12 – 2015-10-19 (×21): 5000 [IU] via SUBCUTANEOUS
  Filled 2015-10-12 (×19): qty 1

## 2015-10-12 MED ORDER — ONDANSETRON HCL 4 MG/2ML IJ SOLN: 4.0000 mg | Freq: Three times a day (TID) | INTRAMUSCULAR | Status: AC | PRN

## 2015-10-12 MED ORDER — SODIUM CHLORIDE 0.9 % IV BOLUS (SEPSIS)
1000.0000 mL | INTRAVENOUS | Status: DC
  Administered 2015-10-12: 1000 mL via INTRAVENOUS

## 2015-10-12 MED ORDER — FENTANYL CITRATE (PF) 100 MCG/2ML IJ SOLN
50.0000 ug | INTRAMUSCULAR | Status: AC | PRN
  Administered 2015-10-12 – 2015-10-13 (×3): 50 ug via INTRAVENOUS
  Filled 2015-10-12 (×4): qty 2

## 2015-10-12 MED ORDER — PRO-STAT SUGAR FREE PO LIQD
30.0000 mL | Freq: Two times a day (BID) | ORAL | Status: AC
  Administered 2015-10-12 (×2): 30 mL
  Filled 2015-10-12 (×3): qty 30

## 2015-10-12 MED ORDER — SODIUM PHOSPHATE 3 MMOLE/ML IV SOLN
30.0000 mmol | Freq: Once | INTRAVENOUS | Status: AC
  Administered 2015-10-12: 30 mmol via INTRAVENOUS
  Filled 2015-10-12: qty 10

## 2015-10-12 MED ORDER — INSULIN ASPART 100 UNIT/ML ~~LOC~~ SOLN: 2.0000 [IU] | SUBCUTANEOUS | Status: DC

## 2015-10-12 MED ORDER — SODIUM CHLORIDE 0.9 % IV SOLN
INTRAVENOUS | Status: DC
  Administered 2015-10-13: 21:00:00 via INTRAVENOUS

## 2015-10-12 MED ORDER — LEVOFLOXACIN IN D5W 750 MG/150ML IV SOLN: 750.0000 mg | Freq: Once | INTRAVENOUS | Status: DC

## 2015-10-12 MED ORDER — IPRATROPIUM-ALBUTEROL 0.5-2.5 (3) MG/3ML IN SOLN: 3.0000 mL | Freq: Four times a day (QID) | RESPIRATORY_TRACT | Status: DC

## 2015-10-12 MED ORDER — ANTISEPTIC ORAL RINSE SOLUTION (CORINZ)
7.0000 mL | Freq: Four times a day (QID) | OROMUCOSAL | Status: DC
  Administered 2015-10-12 – 2015-10-14 (×7): 7 mL via OROMUCOSAL

## 2015-10-12 MED ORDER — ONDANSETRON HCL 4 MG/2ML IJ SOLN: 4.0000 mg | Freq: Four times a day (QID) | INTRAMUSCULAR | Status: DC | PRN

## 2015-10-12 MED ORDER — INSULIN ASPART 100 UNIT/ML ~~LOC~~ SOLN
0.0000 [IU] | SUBCUTANEOUS | Status: DC
  Administered 2015-10-12: 20 [IU] via SUBCUTANEOUS
  Administered 2015-10-12: 3 [IU] via SUBCUTANEOUS
  Administered 2015-10-12: 7 [IU] via SUBCUTANEOUS
  Administered 2015-10-13 (×2): 11 [IU] via SUBCUTANEOUS
  Administered 2015-10-13 (×2): 4 [IU] via SUBCUTANEOUS
  Administered 2015-10-13: 11 [IU] via SUBCUTANEOUS
  Administered 2015-10-13: 7 [IU] via SUBCUTANEOUS
  Administered 2015-10-13: 4 [IU] via SUBCUTANEOUS
  Administered 2015-10-14: 7 [IU] via SUBCUTANEOUS
  Administered 2015-10-14: 4 [IU] via SUBCUTANEOUS

## 2015-10-12 MED ORDER — SODIUM CHLORIDE 0.9 % IV SOLN: 250.0000 mL | INTRAVENOUS | Status: DC | PRN

## 2015-10-12 MED ORDER — FENTANYL CITRATE (PF) 100 MCG/2ML IJ SOLN
50.0000 ug | INTRAMUSCULAR | Status: DC | PRN
  Administered 2015-10-12 – 2015-10-13 (×10): 50 ug via INTRAVENOUS
  Filled 2015-10-12 (×10): qty 2

## 2015-10-12 MED ORDER — ALBUTEROL (5 MG/ML) CONTINUOUS INHALATION SOLN
10.0000 mg/h | INHALATION_SOLUTION | Freq: Once | RESPIRATORY_TRACT | Status: AC
  Administered 2015-10-12: 10 mg/h via RESPIRATORY_TRACT
  Filled 2015-10-12: qty 20

## 2015-10-12 MED ORDER — SODIUM CHLORIDE 0.9 % IV SOLN
INTRAVENOUS | Status: DC
  Administered 2015-10-12: 10:00:00 via INTRAVENOUS

## 2015-10-12 MED ORDER — ETOMIDATE 2 MG/ML IV SOLN
10.0000 mg | Freq: Once | INTRAVENOUS | Status: AC
  Administered 2015-10-12: 10 mg via INTRAVENOUS

## 2015-10-12 MED ORDER — PANTOPRAZOLE SODIUM 40 MG IV SOLR
40.0000 mg | Freq: Every day | INTRAVENOUS | Status: DC
  Administered 2015-10-12 – 2015-10-14 (×3): 40 mg via INTRAVENOUS
  Filled 2015-10-12 (×4): qty 40

## 2015-10-12 MED ORDER — DEXTROSE 5 % IV SOLN
1.0000 g | Freq: Once | INTRAVENOUS | Status: AC
  Administered 2015-10-12: 1 g via INTRAVENOUS
  Filled 2015-10-12: qty 10

## 2015-10-12 MED ORDER — ROCURONIUM BROMIDE 50 MG/5ML IV SOLN
50.0000 mg | Freq: Once | INTRAVENOUS | Status: AC
  Administered 2015-10-12: 50 mg via INTRAVENOUS

## 2015-10-12 MED ORDER — PROPOFOL 1000 MG/100ML IV EMUL
0.0000 ug/kg/min | INTRAVENOUS | Status: DC
  Administered 2015-10-12 (×3): 30 ug/kg/min via INTRAVENOUS
  Administered 2015-10-13: 40 ug/kg/min via INTRAVENOUS
  Filled 2015-10-12 (×3): qty 100

## 2015-10-12 MED ORDER — VITAL AF 1.2 CAL PO LIQD
1000.0000 mL | ORAL | Status: DC
  Administered 2015-10-12: 1000 mL

## 2015-10-12 MED ORDER — ETOMIDATE 2 MG/ML IV SOLN: 10.0000 mg | Freq: Once | INTRAVENOUS | Status: AC

## 2015-10-12 MED ORDER — CHLORHEXIDINE GLUCONATE 0.12% ORAL RINSE (MEDLINE KIT)
15.0000 mL | Freq: Two times a day (BID) | OROMUCOSAL | Status: DC
  Administered 2015-10-12 – 2015-10-14 (×4): 15 mL via OROMUCOSAL

## 2015-10-12 NOTE — ED Notes (Signed)
Patient's dentures removed and given to husband.

## 2015-10-12 NOTE — Progress Notes (Signed)
eLink Physician-Brief Progress Note Patient Name: [name] DOB: 11/04/1940 MRN: [id number]    Recent Labs Lab 10/12/15 0422 10/12/15 1315 10/12/15 1530  NA 137  --   --   K 4.4  --   --   CL 90*  --   --   CO2 36*  --   --   GLUCOSE 273*  --   --   BUN 10  --   --   CREATININE 0.64  --   --   CALCIUM 8.7*  --   --   MG  --  2.1  --   PHOS  --  <1.0* <1.0*      Low phos  Plan 30 mmol of Na Phos    Intervention Category Major Interventions: Electrolyte abnormality - evaluation and management  Genae Strine 10/12/2015, 4:46 PM

## 2015-10-12 NOTE — Telephone Encounter (Signed)
Will forward to Dr. Young as an FYI 

## 2015-10-12 NOTE — Progress Notes (Signed)
Pt taken off BiPAP SATS 100%. Continue CAT with ventolin.

## 2015-10-12 NOTE — ED Provider Notes (Addendum)
CSN: [id number]     Arrival date & time 10/12/15  0350 History  By signing my name below, I, Hansel Feinstein, attest that this documentation has been prepared under the direction and in the presence of Varney Biles, MD. Electronically Signed: Hansel Feinstein, ED Scribe. 10/12/2015. 3:57 AM.    Chief Complaint  Patient presents with  . Respiratory Distress   LEVEL 5 CAVEAT: HPI and ROS limited due to respiratory distress    The history is provided by the patient and the EMS personnel. The history is limited by the condition of the patient. No language interpreter was used.    HPI Comments: [name] is a 75 y.o. female with h/o CAD, HTN, HLD, COPD, MI, carotid artery occlusion, 2 cardiac stents, DM who presents to the Emergency Department due to increased SOB for 3 days, worsened last night at 9PM. Per EMS, breath sounds were almost completely diminished on arrival. Pt is O2 dependent at home on 2L, and was bumped up to 4L on arrival d/t sats dropping to the 50's, with improvement of sats to 70's. Pt was alert and answering questions appropriately. BP 131/8. Pt had albuterol, solumedrol, mag en route with some improvement of symptoms. No h/o intubation for COPD, respiratory distress, heart failure. Pt states improvement of symptoms on BIPAP. Pt is not on any fluid medications. She also complains of rhinorrhea, congestion, generalized weakness.  She states she did not get a flu shot this year. Pt denies new coughing, fevers, CP.   Pt is a full code.   Past Medical History  Diagnosis Date  . CAD (coronary artery disease)     Stent RCA 2005, stent LAD 2006  . Hypertension   . Hyperlipemia   . COPD (chronic obstructive pulmonary disease) ([place])   . Tobacco abuse   . Fatigue   . Shortness of breath   . Myocardial infarction ([place])   . Heart murmur   . Recurrent upper respiratory infection (URI)   . Depression   . Carotid artery occlusion   . Diabetes mellitus without complication [facility])     Past Surgical History  Procedure Laterality Date  . Coronary stent placement      drug-eluting; right coronary artery  . Vesicovaginal fistula closure w/ tah    . Coronary angiography  Nov 25, 2004    CYPHER stenting, left anterior descending  . Cardiac catheterization  2011   Family History  Problem Relation Age of Onset  . Diabetes Mother     deceased  . Heart disease Mother   . Alzheimer's disease Father     deceased  . Diabetes Sister   . Heart attack Sister    Social History  Substance Use Topics  . Smoking status: Former Smoker -- 0.25 packs/day for 50 years    Types: Cigarettes    Quit date: 02/15/2012  . Smokeless tobacco: Never Used     Comment: started smoking at age 39/// Pt is currently trying to quit smoking, using the electronic cigs, not longer smoking tobacco  . Alcohol Use: Yes     Comment: occasional wine   OB History    No data available     Review of Systems  Unable to perform ROS: Acuity of condition    Allergies  Bactrim; Codeine; and Vytorin  Home Medications   Prior to Admission medications   Medication Sig Start Date End Date Taking? Authorizing Provider  ADVAIR DISKUS 250-50 MCG/DOSE AEPB INHALE 1 PUFF EVERY 12HRS 08/24/15  Deneise Lever, MD  albuterol (PROAIR HFA) 108 (90 BASE) MCG/ACT inhaler Inhale 2 puffs into the lungs 4 (four) times daily as needed. Rescue inhaler 08/19/14   Deneise Lever, MD  albuterol (PROVENTIL) (2.5 MG/3ML) 0.083% nebulizer solution USE 1 VIAL IN NEBULIZER EVERY 6HRS FOR WHEEZING 09/03/15   Deneise Lever, MD  Albuterol Sulfate (PROAIR RESPICLICK) 123XX123 (90 BASE) MCG/ACT AEPB Inhale 2 puffs into the lungs every 6 (six) hours as needed. 12/19/14   Deneise Lever, MD  Ascorbic Acid (VITAMIN C) 1000 MG tablet Take 1,000 mg by mouth daily.    Historical Provider, MD  aspirin EC 81 MG tablet Take 81 mg by mouth daily.    Historical Provider, MD  Biotin 1000 MCG tablet Take 1,000 mcg by mouth daily.    Historical  Provider, MD  calcium citrate-vitamin D 200-200 MG-UNIT TABS Take 1 tablet by mouth daily.    Historical Provider, MD  CINNAMON PO Take 2,000 tablets by mouth daily.    Historical Provider, MD  clonazePAM (KLONOPIN) 1 MG tablet Take 1 mg by mouth 2 (two) times daily as needed. For anxiety.    Historical Provider, MD  metFORMIN (GLUCOPHAGE) 500 MG tablet Take 500 mg by mouth 2 (two) times daily. 10/12/14   Historical Provider, MD  Multiple Vitamin (MULTIVITAMIN) tablet Take 1 tablet by mouth daily.    Historical Provider, MD  nitroGLYCERIN (NITROSTAT) 0.4 MG SL tablet Place 1 tablet (0.4 mg total) under the tongue every 5 (five) minutes as needed for chest pain. 04/09/15   Burnell Blanks, MD  SPIRIVA HANDIHALER 18 MCG inhalation capsule INHALE 1 THE CONTENTS OF 1 CAPSULE DAILY 08/24/15   Deneise Lever, MD  triamcinolone cream (KENALOG) 0.1 % APPLY TO ITCH AREAS 2 TO 3 TIMES DAILY 02/12/15   Historical Provider, MD   BP 111/46 mmHg  Pulse 115  Temp(Src) 98.7 F (37.1 C) (Oral)  Resp 24  SpO2 94% Physical Exam  Constitutional: She is oriented to person, place, and time. She appears well-developed and well-nourished.  HENT:  Head: Normocephalic and atraumatic.  Eyes: Conjunctivae and EOM are normal. Pupils are equal, round, and reactive to light.  Neck: Normal range of motion. Neck supple.  Cardiovascular: Tachycardia present.   Tachycardia  Pulmonary/Chest: Effort normal. No respiratory distress. She has no wheezes.  Poor air entry bilaterally. No focal wheezing.   Abdominal: She exhibits no distension.  Musculoskeletal: Normal range of motion. She exhibits no edema.  Lower extremity, there is no pitting edema, no unilateral calf swelling.   Neurological: She is alert and oriented to person, place, and time.  Skin: Skin is warm and dry.  Psychiatric: She has a normal mood and affect. Her behavior is normal.  Nursing note and vitals reviewed.   ED Course  Procedures (including  critical care time) DIAGNOSTIC STUDIES: Oxygen Saturation is 99% on BIPAP, normal by my interpretation.    COORDINATION OF CARE: 3:51 AM Discussed treatment plan with pt at bedside and pt agreed to plan.   Labs Review Labs Reviewed  CBC WITH DIFFERENTIAL/PLATELET - Abnormal; Notable for the following:    RBC 3.71 (*)    MCV 104.9 (*)    Neutro Abs 8.1 (*)    All other components within normal limits  BASIC METABOLIC PANEL - Abnormal; Notable for the following:    Chloride 90 (*)    CO2 36 (*)    Glucose, Bld 273 (*)    Calcium 8.7 (*)  All other components within normal limits  BLOOD GAS, ARTERIAL - Abnormal; Notable for the following:    pH, Arterial 7.156 (*)    pCO2 arterial 99.8 (*)    Bicarbonate 33.8 (*)    Acid-Base Excess 5.4 (*)    All other components within normal limits  CULTURE, BLOOD (ROUTINE X 2)  CULTURE, BLOOD (ROUTINE X 2)  URINE CULTURE  TROPONIN I  BRAIN NATRIURETIC PEPTIDE  INFLUENZA PANEL BY PCR (TYPE A & B, H1N1)  LACTIC ACID, PLASMA  URINALYSIS, ROUTINE W REFLEX MICROSCOPIC (NOT AT [facility])  LACTIC ACID, PLASMA  BLOOD GAS, ARTERIAL  BLOOD GAS, ARTERIAL  I-STAT CG4 LACTIC ACID, ED  I-STAT CG4 LACTIC ACID, ED    Imaging Review Dg Chest Port 1 View  10/12/2015  CLINICAL DATA:  Shortness of breath and hypoxia. EXAM: PORTABLE CHEST 1 VIEW COMPARISON:  06/05/2015 FINDINGS: The lungs remain hyperinflated. The heart size is normal, mediastinal contours are unchanged. Rounded right hilar prominence is likely pulmonary vasculature on-end. Ill-defined bibasilar opacities, favor atelectasis. No pulmonary edema, pleural effusion or pneumothorax. No acute osseous abnormalities are seen. IMPRESSION: 1. Chronic hyperinflation consistent with emphysema. 2. Bibasilar opacities, favor atelectasis. Pneumonia or aspiration could have a similar radiographic appearance. Electronically Signed   By: Jeb Levering M.D.   On: 10/12/2015 04:03   I have personally reviewed  and evaluated these images and lab results as part of my medical decision-making.   EKG Interpretation   Date/Time:  Monday October 12 2015 03:55:21 EST Ventricular Rate:  113 PR Interval:  185 QRS Duration: 91 QT Interval:  333 QTC Calculation: 456 R Axis:   76 Text Interpretation:  Sinus tachycardia Ventricular premature complex No  acute changes Nonspecific ST and T wave abnormality Confirmed by Kindell Strada,  MD, Cheyenne Bordeaux ([id number]) on 10/12/2015 4:07:28 AM      @5 :20: Will d/c bipap and attempts NRB. Lung exam really hasnt had much change. Breathing treatment is still on. Will reassess around 6, if declining, then she will be placed on Bipap again.  @7 :20 am: ABG shows PCO2 at 99, pH 7.17. Pt on Bipap again. Increased the setting to 14/7 this time with increased RR and TV. Pt will get repeat ABG at 7:40, Dr. Sabra Heck to follow up. Dispo: stepdown ICU vs. ICU based on repeat ABG and clinical picture.  MDM   Final diagnoses:  Acute respiratory failure with hypoxia (HCC)  COPD with acute exacerbation ([place])  Acute hypercapnic respiratory failure ([place])    I personally performed the services described in this documentation, which was scribed in my presence. The recorded information has been reviewed and is accurate.  Pt comes in with cc of cc of dib. Pt has hx of advanced COPD. She reports increased cough, work of breathing and hypoxia. She is acute hypoxic resp failure. AOX3, so doubt acute hypercapnia.  Xrays - opacity is noticed. Since there was a rapid decline in resp status, will cover for CAP.    Varney Biles, MD 10/12/15 [place], MD 10/12/15 [phone]

## 2015-10-12 NOTE — ED Provider Notes (Signed)
The patient was signed out to me at change of shift at 7:15 AM, the patient is 75 years old who has severe COPD on home oxygen, presents in respiratory distress after her husband found her to be continually in respiratory distress with decreased level of consciousness. On my exam the patient is able to wake up to physical's pain and loud voice however she has some difficulty following commands, she falls asleep very easily, she is unable to speak more than 1-2 word sentences. She has required multiple interventions including multiple continuous nebulizer therapy, BiPAP, she has had several ABGs all of which show that she is acidotic with a respiratory acidosis and a severe elevated carbon dioxide level. Because of her hypercapnic respiratory failure the patient was monitored very closely on cardiac monitoring, pulmonary monitoring and ABG monitoring. After her third ABG showed that she had a decrease in her oxygenation and minimal change with BiPAP on her carbon dioxide level the decision was made to  intubate the patient. Prior to my arrival it was reported that the patient had agreed to intubation should it be necessary.  On my exam the patient is in respiratory distress, she has minimal air movement, prolonged expiratory phase, tachypnea, tachycardia, no peripheral edema.   Labs, chest x-ray, ABGs reviewed. Persistent hypercapnic respiratory failure. Critical care provided   CRITICAL CARE Performed by: Johnna Acosta Total critical care time: 35 minutes Critical care time was exclusive of separately billable procedures and treating other patients. Critical care was necessary to treat or prevent imminent or life-threatening deterioration. Critical care was time spent personally by me on the following activities: development of treatment plan with patient and/or surrogate as well as nursing, discussions with consultants, evaluation of patient's response to treatment, examination of patient, obtaining  history from patient or surrogate, ordering and performing treatments and interventions, ordering and review of laboratory studies, ordering and review of radiographic studies, pulse oximetry and re-evaluation of patient's condition.  INTUBATION Performed by: Johnna Acosta  Required items: required blood products, implants, devices, and special equipment available Patient identity confirmed: provided demographic data and hospital-assigned identification number Time out: Immediately prior to procedure a "time out" was called to verify the correct patient, procedure, equipment, support staff and site/side marked as required.  Indications: Hypercapneic  Intubation method: Direct Laryngoscopy   Preoxygenation: BVM  Sedatives: 10mg  Etomidate Paralytic: 50mg  Succinylcholine  Tube Size: Any fevers. cuffed  Post-procedure assessment: chest rise and ETCO2 monitor Breath sounds: equal and absent over the epigastrium Tube secured with: ETT holder Chest x-ray interpreted by radiologist and me.  Chest x-ray findings: endotracheal tube in appropriate position  Patient tolerated the procedure well with no immediate complications.   Final diagnoses:  Acute respiratory failure with hypoxia ([name])  COPD with acute exacerbation ([name])  Acute hypercapnic respiratory failure ([name])     [name] Chapel, MD 10/12/15 [phone]

## 2015-10-12 NOTE — ED Notes (Signed)
Patient arrives with acute respiratory distress. States not feeling well for 3 days with sudden onset of difficulty breathing tonight. Patient chronically utilizes 2L via [place], husband increased O2 flow to 4L tonight in effort to assist wife. Upon EMS arrival patient was found with O2 sats around 75%. 1 Duoneb, 125mg  solu-medrol, and 2g magnesium were given by EMS PTA. Patient alert and answering simple questions, but appears listless.

## 2015-10-12 NOTE — H&P (Signed)
PULMONARY / CRITICAL CARE MEDICINE   Name: [name] MRN: [id number] DOB: [birth year]    ADMISSION DATE:  10/12/2015   CONSULTATION DATE:  10/12/2015  REFERRING MD:  ED  CHIEF COMPLAINT:  Acute respiratory distress  HISTORY OF PRESENT ILLNESS:   [name] is a 75 yo White female with a h/o COPD (followed CY) on hone O2 [place] 2L, CAD s/p MI with stents x2, hypertension, hyperlipidemia and Type 2 DM who presents with worsening shortness of breath and AMS. History is obtained from ED records as patient is currently intubated. Shortness of breath started 3 days ago and did not get better with increased home O2 from 2L to 4L and inhalers. At 9pm 01/08, EMS was called to the because patient's husband found her in severe respiratory distress with decreased level of consciousness. Upon EMS arrival, patient's O2 saturation was 75%. She was given 1 duoneb, solu-medrol and magnesium and transferred to the ED. At the ED, she was placed on BiPAP with no improvement in PCO2 and mental status. She was then intubated. She is bring admitted to the ICU for further management.  Patient is sedated and remains on full vent support. She has no reported or documented fevers however, she indicated that she had rhinorrhea, congestion, generalized and weakness prior to ED arrival.Patient did not get a flu shot this year.   PAST MEDICAL HISTORY :  She  has a past medical history of CAD (coronary artery disease); Hypertension; Hyperlipemia; COPD (chronic obstructive pulmonary disease) ([place]); Tobacco abuse; Fatigue; Shortness of breath; Myocardial infarction ([place]); Heart murmur; Recurrent upper respiratory infection (URI); Depression; Carotid artery occlusion; and Diabetes mellitus without complication ([place]).  PAST SURGICAL HISTORY: She  has past surgical history that includes Coronary stent placement; Vesicovaginal fistula closure w/ TAH; coronary angiography (Nov 25, 2004); and Cardiac catheterization  (2011).  Allergies  Allergen Reactions  . Bactrim [Sulfamethoxazole-Trimethoprim]     nausea  . Codeine   . Vytorin [Ezetimibe-Simvastatin] Other (See Comments)    No current facility-administered medications on file prior to encounter.   Current Outpatient Prescriptions on File Prior to Encounter  Medication Sig  . ADVAIR DISKUS 250-50 MCG/DOSE AEPB INHALE 1 PUFF EVERY 12HRS  . albuterol (PROAIR HFA) 108 (90 BASE) MCG/ACT inhaler Inhale 2 puffs into the lungs 4 (four) times daily as needed. Rescue inhaler  . albuterol (PROVENTIL) (2.5 MG/3ML) 0.083% nebulizer solution USE 1 VIAL IN NEBULIZER EVERY 6HRS FOR WHEEZING  . Albuterol Sulfate (PROAIR RESPICLICK) 123XX123 (90 BASE) MCG/ACT AEPB Inhale 2 puffs into the lungs every 6 (six) hours as needed.  . Ascorbic Acid (VITAMIN C) 1000 MG tablet Take 1,000 mg by mouth daily.  Marland Kitchen aspirin EC 81 MG tablet Take 81 mg by mouth daily.  . Biotin 1000 MCG tablet Take 1,000 mcg by mouth daily.  . calcium citrate-vitamin D 200-200 MG-UNIT TABS Take 1 tablet by mouth daily.  Marland Kitchen CINNAMON PO Take 2,000 tablets by mouth daily.  . clonazePAM (KLONOPIN) 1 MG tablet Take 1 mg by mouth 2 (two) times daily as needed. For anxiety.  . metFORMIN (GLUCOPHAGE) 500 MG tablet Take 500 mg by mouth 2 (two) times daily.  . Multiple Vitamin (MULTIVITAMIN) tablet Take 1 tablet by mouth daily.  . nitroGLYCERIN (NITROSTAT) 0.4 MG SL tablet Place 1 tablet (0.4 mg total) under the tongue every 5 (five) minutes as needed for chest pain.  Marland Kitchen SPIRIVA HANDIHALER 18 MCG inhalation capsule INHALE 1 THE CONTENTS OF 1 CAPSULE DAILY  . triamcinolone  cream (KENALOG) 0.1 % APPLY TO ITCH AREAS 2 TO 3 TIMES DAILY    FAMILY HISTORY:  Her indicated that her mother is deceased. She indicated that her father is deceased. She indicated that her maternal grandmother is deceased. She indicated that her maternal grandfather is deceased. She indicated that her paternal grandmother is deceased. She  indicated that her paternal grandfather is deceased.   SOCIAL HISTORY: She  reports that she quit smoking about 3 years ago. Her smoking use included Cigarettes. She has a 12.5 pack-year smoking history. She has never used smokeless tobacco. She reports that she drinks alcohol. She reports that she does not use illicit drugs.  REVIEW OF SYSTEMS:   Unable to obtain. Patient is sedated and vented  SUBJECTIVE:   VITAL SIGNS: BP 120/52 mmHg  Pulse 113  Temp(Src) 98.7 F (37.1 C) (Oral)  Resp 22  Wt 57.607 kg (127 lb)  SpO2 92%  HEMODYNAMICS:    VENTILATOR SETTINGS: Vent Mode:  [-] PRVC FiO2 (%):  [40 %-100 %] 100 % Set Rate:  [16 bmp] 16 bmp Vt Set:  [500 mL] 500 mL Plateau Pressure:  [22 cmH20] 22 cmH20  INTAKE / OUTPUT:    PHYSICAL EXAMINATION: General: Sedated Neuro: Unresponsive to voice, touch or noxious stimulus HEENT: Pupils pinpointed; ETT, trachea midline Cardiovascular: Tachycardic at 106 bpm, S1/S2 Lungs: Bilateral airflow, + wheezing; no rhonchi Abdomen: Soft, NT, ND, normal bowel sounds Musculoskeletal: No joint deformities Skin: Pink, no rash  LABS:  BMET  Recent Labs Lab 10/12/15 0422  NA 137  K 4.4  CL 90*  CO2 36*  BUN 10  CREATININE 0.64  GLUCOSE 273*    Electrolytes  Recent Labs Lab 10/12/15 0422  CALCIUM 8.7*    CBC  Recent Labs Lab 10/12/15 0422  WBC 9.7  HGB 12.4  HCT 38.9  PLT 180    Coag's No results for input(s): APTT, INR in the last 168 hours.  Sepsis Markers  Recent Labs Lab 10/12/15 0509 10/12/15 0738 10/12/15 0739  LATICACIDVEN 1.2 3.53* 3.3*    ABG  Recent Labs Lab 10/12/15 0700 10/12/15 0755  PHART 7.156* 7.174*  PCO2ART 99.8* 94.3*  PO2ART 85.0 58.0*    Liver Enzymes No results for input(s): AST, ALT, ALKPHOS, BILITOT, ALBUMIN in the last 168 hours.  Cardiac Enzymes  Recent Labs Lab 10/12/15 0422  TROPONINI <0.03    Glucose No results for input(s): GLUCAP in the last 168  hours.   STUDIES:   Imaging Dg Chest Portable 1 View  10/12/2015  CLINICAL DATA:  Endotracheal tube placement EXAM: PORTABLE CHEST 1 VIEW COMPARISON:  10/12/2015 FINDINGS: Cardiomediastinal silhouette is stable. Endotracheal tube in place with tip 3.8 cm above the carina. Hyperinflation again noted. No pulmonary edema. No pneumothorax. Persistent left basilar atelectasis infiltrate or scarring. IMPRESSION: Endotracheal tube in place. No pneumothorax. Persistent left basilar atelectasis, infiltrate or scarring. Hyperinflation again noted. Electronically Signed   By: Lahoma Crocker M.D.   On: 10/12/2015 08:37   Dg Chest Port 1 View  10/12/2015  CLINICAL DATA:  Shortness of breath and hypoxia. EXAM: PORTABLE CHEST 1 VIEW COMPARISON:  06/05/2015 FINDINGS: The lungs remain hyperinflated. The heart size is normal, mediastinal contours are unchanged. Rounded right hilar prominence is likely pulmonary vasculature on-end. Ill-defined bibasilar opacities, favor atelectasis. No pulmonary edema, pleural effusion or pneumothorax. No acute osseous abnormalities are seen. IMPRESSION: 1. Chronic hyperinflation consistent with emphysema. 2. Bibasilar opacities, favor atelectasis. Pneumonia or aspiration could have a similar radiographic appearance.  Electronically Signed   By: Jeb Levering M.D.   On: 10/12/2015 04:03    CULTURES: Blood x 2 01/09>> Urine 01/09>> Sputum 01/09>>  ANTIBIOTICS: Levaquin 750 Q24H 01/09>>  SIGNIFICANT EVENTS: 01/05: SOB 01/08: Worsening SOB and AMS>ED 01/09: No improvement on BiPAP>Intubated>ICU  LINES/TUBES: ETT 01/09 PIVs  DISCUSSION:   ASSESSMENT / PLAN:  PULMONARY A: Acute on chronic respiratory failure Acute COPD exacerbation Bilateral lower lobe opacity, right >left; ?CAP, no leukocytosis and no fever P:   Full vent support VAP protocol Duoneb scheduled and prn IV solumedrol CXR in am Repeat ABG  CARDIOVASCULAR A:  H/O CAD S/P MI and Stents X  2 Tachycardia P:  Not on any BP or CAD medications at home except prn SL NTG Monitor hemodynamics per ICU Protocol  RENAL A:   No acute issues P:   Monitor and replace electrolytes Monitor creatinine on levaquin  GASTROINTESTINAL A:   No acute issues NPO P:   PPI for GI prophylaxis  Nutritional consult for TFs  HEMATOLOGIC A:   No acute issues P:  Monitor cbc and transfuse pr ICU protocol  INFECTIOUS A:   Bilateral lower lobe opacity, right >left; suspect CAP, but patient has no leukocytosis and no fever P:   Levofloxacin  F/U cultures Follow Pct.   ENDOCRINE A:   Type 2 DM P:   BGTs with SSI   NEUROLOGIC A:   Acute encephalopathy 2/2 to hypercarbia and hypoxia P:   RASS goal:-2 Propofol gtt and prn fentanyl for pain and sedation   FAMILY  - Updates: No family at bedside  - Inter-disciplinary family meet or Palliative Care meeting due by:  day 7   Critical care time spent examining patient, establishing treatment plan, managing vent, reviewing history and labs, CXR, and ABG interpretation is 45 minutes  Magdalene S. Patria Mane, NP-C Pulmonary and [place] Pager: [phone] 10/12/2015, 8:54 AM   Attending Note:  I have examined patient, reviewed labs, studies and notes. I have discussed the case with Arvil Chaco, and I agree with the data and plans as amended above. Patient is followed by [facility] for severe COPD, also has hx CAD and HTN. She was brought emergently to ED with acute hypoxemic resp failure in setting of wheezing, recent URI sx. Required intubation and MV support. On my eval she is intubated and sedated. She continues to have soft wheeze bilaterally. Pplat ~ 19-20 on 8cc/kg. Will continue scheduled BD's, corticosteroids, current MV support. Goal lighten sedation and assess for PSV 1/10 if wheezing has improved. Will continue abx for now but low threshold to d/c in absence elevated WBC, fever, sputum.  My critical care  time independent of APP is 35 minutes.   Baltazar Apo, MD, PhD 10/12/2015, 12:20 PM [place] [facility] [phone] or if no answer [phone]

## 2015-10-12 NOTE — Progress Notes (Signed)
Initial Nutrition Assessment  DOCUMENTATION CODES:   Not applicable  INTERVENTION:    Initiate TF via OGT with Vital AF 1.2 at 25 ml/h and Prostat 30 ml BID on day 1; on day 2, increase to goal rate of 50 ml/h (1200 ml per day) to provide 1440 kcals, 90 gm protein, 973 ml free water daily.  NUTRITION DIAGNOSIS:   Inadequate oral intake related to inability to eat as evidenced by NPO status.  GOAL:   Patient will meet greater than or equal to 90% of their needs  MONITOR:   Vent status, Labs, Weight trends, TF tolerance, I & O's  REASON FOR ASSESSMENT:   Consult Enteral/tube feeding initiation and management  ASSESSMENT:   75 yo White female with a h/o COPD on home O2 [place] 2L, CAD s/p MI with stents x2, hypertension, hyperlipidemia and Type 2 DM who presents with worsening shortness of breath and AMS.   Discussed patient in ICU rounds and with RN today. Received MD Consult for TF initiation and management. Nutrition focused physical exam completed.  No muscle or subcutaneous fat depletion noticed. Labs reviewed: phosphorus low at < 1.  Patient is currently intubated on ventilator support MV: 8 L/min Temp (24hrs), Avg:99.2 F (37.3 C), Min:98.1 F (36.7 C), Max:100.7 F (38.2 C)  Propofol: 10.4 ml/hr providing 275 kcals per day   Diet Order:  Diet NPO time specified  Skin:  Reviewed, no issues  Last BM:  1/8  Height:   Ht Readings from Last 1 Encounters:  10/12/15 5\' 1"  (1.549 m)    Weight:   Wt Readings from Last 1 Encounters:  10/12/15 126 lb 5.2 oz (57.3 kg)    Ideal Body Weight:  47.7 kg  BMI:  Body mass index is 23.88 kg/(m^2).  Estimated Nutritional Needs:   Kcal:  1395  Protein:  80-90 gm  Fluid:  1.5 L  EDUCATION NEEDS:   No education needs identified at this time  Molli Barrows, [place], [place], [place] Pager [phone] After Hours Pager [phone]

## 2015-10-12 NOTE — Progress Notes (Signed)
Sputum sample obtained and sent to lab by RT. 

## 2015-10-13 ENCOUNTER — Inpatient Hospital Stay (HOSPITAL_COMMUNITY): Payer: Medicare Other

## 2015-10-13 DIAGNOSIS — J9601 Acute respiratory failure with hypoxia: Secondary | ICD-10-CM | POA: Insufficient documentation

## 2015-10-13 DIAGNOSIS — J9602 Acute respiratory failure with hypercapnia: Secondary | ICD-10-CM

## 2015-10-13 DIAGNOSIS — J441 Chronic obstructive pulmonary disease with (acute) exacerbation: Secondary | ICD-10-CM | POA: Insufficient documentation

## 2015-10-13 LAB — BASIC METABOLIC PANEL
ANION GAP: 11 (ref 5–15)
BUN: 19 mg/dL (ref 6–20)
CHLORIDE: 100 mmol/L — AB (ref 101–111)
CO2: 28 mmol/L (ref 22–32)
Calcium: 8.2 mg/dL — ABNORMAL LOW (ref 8.9–10.3)
Creatinine, Ser: 0.72 mg/dL (ref 0.44–1.00)
GFR calc non Af Amer: >60 mL/min (ref >60)
Glucose, Bld: 236 mg/dL — ABNORMAL HIGH (ref 65–99)
POTASSIUM: 4 mmol/L (ref 3.5–5.1)
SODIUM: 139 mmol/L (ref 135–145)

## 2015-10-13 LAB — PHOSPHORUS: PHOSPHORUS: 2.4 mg/dL — AB (ref 2.5–4.6)

## 2015-10-13 LAB — CBC
HCT: 34.2 % — ABNORMAL LOW (ref 36.0–46.0)
HEMOGLOBIN: 10.7 g/dL — AB (ref 12.0–15.0)
MCH: 32.4 pg (ref 26.0–34.0)
MCHC: 31.3 g/dL (ref 30.0–36.0)
MCV: 103.6 fL — ABNORMAL HIGH (ref 78.0–100.0)
Platelets: 165 10*3/uL (ref 150–400)
RBC: 3.3 MIL/uL — AB (ref 3.87–5.11)
RDW: 12.8 % (ref 11.5–15.5)
WBC: 20.1 10*3/uL — AB (ref 4.0–10.5)

## 2015-10-13 LAB — GLUCOSE, CAPILLARY
GLUCOSE-CAPILLARY: 173 mg/dL — AB (ref 65–99)
GLUCOSE-CAPILLARY: 261 mg/dL — AB (ref 65–99)
Glucose-Capillary: 179 mg/dL — ABNORMAL HIGH (ref 65–99)
Glucose-Capillary: 219 mg/dL — ABNORMAL HIGH (ref 65–99)
Glucose-Capillary: 194 mg/dL — ABNORMAL HIGH (ref 65–99)
Glucose-Capillary: 274 mg/dL — ABNORMAL HIGH (ref 65–99)

## 2015-10-13 LAB — HEPATIC FUNCTION PANEL
ALT: 16 U/L (ref 14–54)
AST: 37 U/L (ref 15–41)
Albumin: 3.1 g/dL — ABNORMAL LOW (ref 3.5–5.0)
Alkaline Phosphatase: 55 U/L (ref 38–126)
BILIRUBIN TOTAL: 0.2 mg/dL — AB (ref 0.3–1.2)
Total Protein: 6.4 g/dL — ABNORMAL LOW (ref 6.5–8.1)

## 2015-10-13 LAB — URINE CULTURE: Culture: NO GROWTH

## 2015-10-13 LAB — MAGNESIUM: MAGNESIUM: 2 mg/dL (ref 1.7–2.4)

## 2015-10-13 LAB — LACTIC ACID, PLASMA: Lactic Acid, Venous: 2.4 mmol/L (ref 0.5–2.0)

## 2015-10-13 LAB — PROCALCITONIN: PROCALCITONIN: 0.82 ng/mL

## 2015-10-13 LAB — TROPONIN I: Troponin I: 0.07 ng/mL — ABNORMAL HIGH (ref <0.031)

## 2015-10-13 MED ORDER — DEXMEDETOMIDINE HCL IN NACL 200 MCG/50ML IV SOLN
0.4000 ug/kg/h | INTRAVENOUS | Status: DC
  Administered 2015-10-13: 1.1 ug/kg/h via INTRAVENOUS
  Administered 2015-10-13: 0.4 ug/kg/h via INTRAVENOUS
  Administered 2015-10-13: 1 ug/kg/h via INTRAVENOUS
  Administered 2015-10-14 (×4): 1.2 ug/kg/h via INTRAVENOUS
  Filled 2015-10-13 (×8): qty 50

## 2015-10-13 MED ORDER — FENTANYL CITRATE (PF) 100 MCG/2ML IJ SOLN
100.0000 ug | INTRAMUSCULAR | Status: DC | PRN
  Administered 2015-10-14: 100 ug via INTRAVENOUS
  Filled 2015-10-13: qty 2

## 2015-10-13 MED ORDER — METHYLPREDNISOLONE SODIUM SUCC 125 MG IJ SOLR
60.0000 mg | Freq: Two times a day (BID) | INTRAMUSCULAR | Status: DC
  Administered 2015-10-13: 60 mg via INTRAVENOUS
  Filled 2015-10-13 (×2): qty 0.96

## 2015-10-13 MED ORDER — HYDRALAZINE HCL 20 MG/ML IJ SOLN
5.0000 mg | Freq: Once | INTRAMUSCULAR | Status: AC
  Administered 2015-10-13: 5 mg via INTRAVENOUS
  Filled 2015-10-13: qty 1

## 2015-10-13 NOTE — Progress Notes (Signed)
Inpatient Diabetes Program Recommendations  AACE/ADA: New Consensus Statement on Inpatient Glycemic Control (2015)  Target Ranges:  Prepandial:   less than 140 mg/dL      Peak postprandial:   less than 180 mg/dL (1-2 hours)      Critically ill patients:  140 - 180 mg/dL   Results for REVAN, STROJNY (MRN [id number]) as of 10/13/2015 09:15  Ref. Range 10/12/2015 10:08 10/12/2015 11:53 10/12/2015 16:01 10/12/2015 20:24 10/13/2015 00:41 10/13/2015 04:13 10/13/2015 08:32  Glucose-Capillary Latest Ref Range: 65-99 mg/dL 386 (H) 466 (H) 211 (H) 141 (H) 261 (H) 179 (H) 219 (H)   Review of Glycemic Control  Diabetes history: DM 2 Outpatient Diabetes medications: Metformin 500 mg BID Current orders for Inpatient glycemic control: Novolog Resistant Correction Q4hrs  Inpatient Diabetes Program Recommendations: Insulin - Tube Feed Coverage: Patient receiving IV Solumedrol 60 mg Q6hrs. Also receiving Tube Feeds, Vital AF 1.2 at 50/hr. While on tube feeds, please consider starting Tube Feed Coverage Novolog 3 units Q4hrs in addition to Novolog Correction scale.  Thanks,  Tama Headings RN, MSN, [facility] Inpatient Diabetes Coordinator Team Pager [phone] (8a-5p)

## 2015-10-13 NOTE — Progress Notes (Signed)
CRITICAL VALUE ALERT  Critical value received:  Lactic Acid  Date of notification:  10/13/15  Time of notification:  [id number]  Critical value read back:Yes.    Nurse who received alert:  Donald Siva RN   MD notified (1st page):  Dr. Emmit Alexanders  Time of first page:  [phone]  MD notified (2nd page):  Time of second page:  Responding MD:  Dr. Emmit Alexanders  Time MD responded:  [phone]

## 2015-10-13 NOTE — Progress Notes (Signed)
PULMONARY / CRITICAL CARE MEDICINE   Name: [name] MRN: [id number] DOB: 07-07-1941    ADMISSION DATE:  10/12/2015  REFERRING MD:  ED  CHIEF COMPLAINT:  Acute Respiratory Distress  HISTORY OF PRESENT ILLNESS:   [name] is a 75 y.o. female w/ PMHx of COPD on home O2 (2L), CAD s/p stent x2, HTN, HLD, DM type II, who presented with acute worsening of SOB and confusion. Per chart review, SOB started 3 days prior and did not improve with increasing O2 to 4L or home inhaler use. In the evening of 1/8, husband called EMS d/t significant respiratory distress and decreased level of consciousness. Upon EMS arrival, SpO2 was found to be 75%, given Duoneb, SoluMedrol, Mag, and brought to the ED. In the ED, patient was placed on BiPAP with no improvement in respiratory or mental status. Intubated in the ED and admitted to the ICU for further management. Patient apparently had also recently had rhinorrhea and congestion, did not receive flu shot this year.    SUBJECTIVE:  Intermittent agitation overnight, required varying dosages of Propofol/Fentanyl prn for sedation. Mild dyssynchrony on the vent.   VITAL SIGNS: BP 109/63 mmHg  Pulse 75  Temp(Src) 98.2 F (36.8 C) (Oral)  Resp 16  Ht 5\' 1"  (1.549 m)  Wt 131 lb 13.4 oz (59.8 kg)  BMI 24.92 kg/m2  SpO2 98%  HEMODYNAMICS:    VENTILATOR SETTINGS: Vent Mode:  [-] PRVC FiO2 (%):  [40 %-100 %] 40 % Set Rate:  [16 bmp] 16 bmp Vt Set:  [500 mL] 500 mL PEEP:  [5 cmH20] 5 cmH20 Plateau Pressure:  [18 cmH20-30 cmH20] 20 cmH20  INTAKE / OUTPUT: I/O last 3 completed shifts: In: 957.2 [I.V.:397.6; NG/GT:49.6; IV Piggyback:510] Out: 925 [Urine:925]  PHYSICAL EXAMINATION: General: Elderly female, sedated, intubated.  Neuro: RASS -3. PERRL. Moves all extremities spontaneously.  HEENT: Moist mucus membranes, no JVD noted. ETT/OGT in place.  Cardiovascular: RRR, no murmurs, gallops, rubs.  Lungs: Air entry equal bilaterally,  scattered wheezes, most in right base. Coarse breath sounds. No rales.  Abdomen: Soft, non-tender, non-distended, BS +.  Musculoskeletal: No LE edema.  Skin: Warm, well perfused, no rash.   LABS:  BMET  Recent Labs Lab 10/12/15 0422 10/13/15 0243  NA 137 139  K 4.4 4.0  CL 90* 100*  CO2 36* 28  BUN 10 19  CREATININE 0.64 0.72  GLUCOSE 273* 236*    Electrolytes  Recent Labs Lab 10/12/15 0422 10/12/15 1315 10/12/15 1530 10/13/15 0243  CALCIUM 8.7*  --   --  8.2*  MG  --  2.1  --  2.0  PHOS  --  <1.0* <1.0* 2.4*    CBC  Recent Labs Lab 10/12/15 0422 10/13/15 0243  WBC 9.7 20.1*  HGB 12.4 10.7*  HCT 38.9 34.2*  PLT 180 165    Sepsis Markers  Recent Labs Lab 10/12/15 0739 10/12/15 1315 10/13/15 0243  LATICACIDVEN 3.3* 4.4* 2.4*  PROCALCITON  --  0.70 0.82    ABG  Recent Labs Lab 10/12/15 0700 10/12/15 0755 10/12/15 0932  PHART 7.156* 7.174* 7.328*  PCO2ART 99.8* 94.3* 55.0*  PO2ART 85.0 58.0* 205.0*    Liver Enzymes No results for input(s): AST, ALT, ALKPHOS, BILITOT, ALBUMIN in the last 168 hours.  Cardiac Enzymes  Recent Labs Lab 10/12/15 1813 10/12/15 2253 10/13/15 0243  TROPONINI 0.08* 0.09* 0.07*    Glucose  Recent Labs Lab 10/12/15 1008 10/12/15 1153 10/12/15 1601 10/12/15 2024 10/13/15 0041  10/13/15 0413  GLUCAP 386* 466* 211* 141* 261* 179*    Imaging Dg Chest Portable 1 View  10/12/2015  CLINICAL DATA:  Endotracheal tube placement EXAM: PORTABLE CHEST 1 VIEW COMPARISON:  10/12/2015 FINDINGS: Cardiomediastinal silhouette is stable. Endotracheal tube in place with tip 3.8 cm above the carina. Hyperinflation again noted. No pulmonary edema. No pneumothorax. Persistent left basilar atelectasis infiltrate or scarring. IMPRESSION: Endotracheal tube in place. No pneumothorax. Persistent left basilar atelectasis, infiltrate or scarring. Hyperinflation again noted. Electronically Signed   By: Lahoma Crocker M.D.   On: 10/12/2015  08:37   Dg Abd Portable 1v  10/12/2015  CLINICAL DATA:  Orogastric tube placement EXAM: PORTABLE ABDOMEN - 1 VIEW COMPARISON:  07/17/2004 FINDINGS: Orogastric tube passes below the diaphragm to have its tip in the distal stomach. Normal bowel gas pattern. IMPRESSION: Well-positioned orogastric tube. Electronically Signed   By: Lajean Manes M.D.   On: 10/12/2015 16:36     STUDIES:  CXR 1/9 >> Left base atelectasis  CULTURES: Blood x 2 01/09 >> Urine 01/09 >> Sputum 01/09 >> Resp Virus Panel 01/09 >>   ANTIBIOTICS: Rocephin 1/9 >> 1/9 Azithro 1/9 >> 1/9 Levaquin 1/10 >>  SIGNIFICANT EVENTS: 01/05: SOB 01/08: Worsening SOB and AMS > ED 01/09: No improvement on BiPAP > Intubated > ICU  LINES/TUBES: ETT 1/9 >> PIV  DISCUSSION: 75 y.o. female w/ PMHx of COPD on home O2 (2L), CAD s/p stent x2, HTN, HLD, DM type II, admitted for acute respiratory failure 2/2 COPD exacerbation vs viral pneumonitis.   ASSESSMENT / PLAN:  PULMONARY A: Acute on Chronic Respiratory Failure COPD Exacerbation ?Pneumonia; bacterial vs viral P:   Full vent support Wean as tolerated SoluMedrol 60 mg q6h Duonebs scheduled/prn See ID  CARDIOVASCULAR A:  CAD s/p stent x2 Tachycardia; resolved P:  Continue telemetry Resume ASA when able  RENAL A:   Lactic acidosis; resolving P:   Repeat BMP in AM  GASTROINTESTINAL A:   SUP Nutrition P:   Protonix Continue tube feeds   HEMATOLOGIC A:   Leukocytosis; 2/2 infection vs steroids Macrocytic Anemia P:  Repeat BMP in AM Check LFT's  INFECTIOUS A:   Possible CAP; Procalcitonin 0.82 P:   Continue Levaquin PCT daily  ENDOCRINE A:   DM type II w/ hyperglycemia, improved P:   ISS q4h; can add TF coverage if continues to be elevated   NEUROLOGIC A:   Acute Encephalopathy 2/2 Hypercapnea P:   RASS goal: -2 Propofol gtt, Fentanyl prn for pain/sedation Consider adding Versed prn given home use of Klonopin  FAMILY  -  Updates: Will attempt to update husband/daughter today (1/10)   Natasha Bence, MD PGY-3, Internal Medicine Pager: [phone]   10/13/2015, 6:57 AM

## 2015-10-14 ENCOUNTER — Inpatient Hospital Stay (HOSPITAL_COMMUNITY): Payer: Medicare Other

## 2015-10-14 LAB — PROCALCITONIN: Procalcitonin: 0.48 ng/mL

## 2015-10-14 LAB — BASIC METABOLIC PANEL
ANION GAP: 11 (ref 5–15)
BUN: 27 mg/dL — ABNORMAL HIGH (ref 6–20)
CALCIUM: 8.3 mg/dL — AB (ref 8.9–10.3)
CO2: 31 mmol/L (ref 22–32)
Chloride: 101 mmol/L (ref 101–111)
Creatinine, Ser: 0.65 mg/dL (ref 0.44–1.00)
Glucose, Bld: 183 mg/dL — ABNORMAL HIGH (ref 65–99)
POTASSIUM: 4 mmol/L (ref 3.5–5.1)
SODIUM: 143 mmol/L (ref 135–145)

## 2015-10-14 LAB — CBC WITH DIFFERENTIAL/PLATELET
Basophils Absolute: 0 10*3/uL (ref 0.0–0.1)
Basophils Relative: 0 %
EOS PCT: 0 %
Eosinophils Absolute: 0 10*3/uL (ref 0.0–0.7)
HEMATOCRIT: 34.4 % — AB (ref 36.0–46.0)
Hemoglobin: 11.2 g/dL — ABNORMAL LOW (ref 12.0–15.0)
LYMPHS ABS: 0.7 10*3/uL (ref 0.7–4.0)
Lymphocytes Relative: 3 %
MCH: 33 pg (ref 26.0–34.0)
MCHC: 32.6 g/dL (ref 30.0–36.0)
MCV: 101.5 fL — AB (ref 78.0–100.0)
MONOS PCT: 6 %
Monocytes Absolute: 1.4 10*3/uL — ABNORMAL HIGH (ref 0.1–1.0)
NEUTROS ABS: 21.4 10*3/uL — AB (ref 1.7–7.7)
Neutrophils Relative %: 91 %
PLATELETS: 168 10*3/uL (ref 150–400)
RBC: 3.39 MIL/uL — ABNORMAL LOW (ref 3.87–5.11)
RDW: 13.1 % (ref 11.5–15.5)
WBC: 23.5 10*3/uL — ABNORMAL HIGH (ref 4.0–10.5)

## 2015-10-14 LAB — RESPIRATORY VIRUS PANEL
Adenovirus: NEGATIVE
INFLUENZA A: NEGATIVE
Influenza B: NEGATIVE
Metapneumovirus: NEGATIVE
Parainfluenza 1: NEGATIVE
Parainfluenza 2: NEGATIVE
Parainfluenza 3: NEGATIVE
RESPIRATORY SYNCYTIAL VIRUS A: POSITIVE — AB
RESPIRATORY SYNCYTIAL VIRUS B: NEGATIVE
RHINOVIRUS: NEGATIVE

## 2015-10-14 LAB — GLUCOSE, CAPILLARY
GLUCOSE-CAPILLARY: 163 mg/dL — AB (ref 65–99)
GLUCOSE-CAPILLARY: 198 mg/dL — AB (ref 65–99)
GLUCOSE-CAPILLARY: 264 mg/dL — AB (ref 65–99)
GLUCOSE-CAPILLARY: 279 mg/dL — AB (ref 65–99)
GLUCOSE-CAPILLARY: 92 mg/dL (ref 65–99)
Glucose-Capillary: 245 mg/dL — ABNORMAL HIGH (ref 65–99)

## 2015-10-14 MED ORDER — MIDAZOLAM HCL 2 MG/2ML IJ SOLN: 1.0000 mg | INTRAMUSCULAR | Status: DC | PRN

## 2015-10-14 MED ORDER — INSULIN ASPART 100 UNIT/ML ~~LOC~~ SOLN
0.0000 [IU] | Freq: Three times a day (TID) | SUBCUTANEOUS | Status: DC
  Administered 2015-10-14: 3 [IU] via SUBCUTANEOUS
  Administered 2015-10-15: 5 [IU] via SUBCUTANEOUS
  Administered 2015-10-15: 2 [IU] via SUBCUTANEOUS
  Administered 2015-10-15: 5 [IU] via SUBCUTANEOUS
  Administered 2015-10-16: 8 [IU] via SUBCUTANEOUS
  Administered 2015-10-16: 3 [IU] via SUBCUTANEOUS
  Administered 2015-10-16: 5 [IU] via SUBCUTANEOUS
  Administered 2015-10-17: 11 [IU] via SUBCUTANEOUS
  Administered 2015-10-17: 5 [IU] via SUBCUTANEOUS
  Administered 2015-10-17: 2 [IU] via SUBCUTANEOUS

## 2015-10-14 MED ORDER — INSULIN ASPART 100 UNIT/ML ~~LOC~~ SOLN
3.0000 [IU] | SUBCUTANEOUS | Status: DC
  Administered 2015-10-14: 3 [IU] via SUBCUTANEOUS

## 2015-10-14 MED ORDER — FENTANYL CITRATE (PF) 100 MCG/2ML IJ SOLN: 50.0000 ug | INTRAMUSCULAR | Status: DC | PRN

## 2015-10-14 MED ORDER — CLONAZEPAM 0.5 MG PO TABS
1.0000 mg | ORAL_TABLET | Freq: Two times a day (BID) | ORAL | Status: DC | PRN
  Administered 2015-10-14 – 2015-10-18 (×5): 1 mg via ORAL
  Filled 2015-10-14 (×5): qty 2

## 2015-10-14 MED ORDER — HYDRALAZINE HCL 20 MG/ML IJ SOLN
5.0000 mg | Freq: Once | INTRAMUSCULAR | Status: AC
  Administered 2015-10-14: 5 mg via INTRAVENOUS
  Filled 2015-10-14: qty 1

## 2015-10-14 MED ORDER — METHYLPREDNISOLONE SODIUM SUCC 125 MG IJ SOLR
60.0000 mg | Freq: Three times a day (TID) | INTRAMUSCULAR | Status: DC
  Administered 2015-10-14 – 2015-10-15 (×3): 60 mg via INTRAVENOUS
  Filled 2015-10-14 (×2): qty 0.96
  Filled 2015-10-14: qty 2
  Filled 2015-10-14: qty 0.96

## 2015-10-14 MED ORDER — INSULIN ASPART 100 UNIT/ML ~~LOC~~ SOLN
0.0000 [IU] | Freq: Every day | SUBCUTANEOUS | Status: DC
  Administered 2015-10-14 – 2015-10-15 (×2): 3 [IU] via SUBCUTANEOUS
  Administered 2015-10-16 – 2015-10-17 (×2): 2 [IU] via SUBCUTANEOUS

## 2015-10-14 MED ORDER — FENTANYL CITRATE (PF) 100 MCG/2ML IJ SOLN: 12.5000 ug | INTRAMUSCULAR | Status: DC | PRN

## 2015-10-14 NOTE — Evaluation (Signed)
Clinical/Bedside Swallow Evaluation Patient Details  Name: [name] MRN: [id number] Date of Birth: April 26, 1941  Today's Date: 10/14/2015 Time: SLP Start Time (ACUTE ONLY): 1330 SLP Stop Time (ACUTE ONLY): 1347 SLP Time Calculation (min) (ACUTE ONLY): 17 min  Past Medical History:  Past Medical History  Diagnosis Date  . CAD (coronary artery disease)     Stent RCA 2005, stent LAD 2006  . Hypertension   . Hyperlipemia   . COPD (chronic obstructive pulmonary disease) ([place])   . Tobacco abuse   . Fatigue   . Shortness of breath   . Myocardial infarction ([place])   . Heart murmur   . Recurrent upper respiratory infection (URI)   . Depression   . Carotid artery occlusion   . Diabetes mellitus without complication [place])    Past Surgical History:  Past Surgical History  Procedure Laterality Date  . Coronary stent placement      drug-eluting; right coronary artery  . Vesicovaginal fistula closure w/ tah    . Coronary angiography  Nov 25, 2004    CYPHER stenting, left anterior descending  . Cardiac catheterization  2011   HPI:  Pt is a 75 y.o. female with a h/o COPD, hypertension, MI, recurrent URI and DM. Pt presented to ED 1/9 in severe respiratory distress and was intubated upon arrival. Pt was extubated 1/11 and uses nasal cannula currently. CXR 1/10 shows no pleural effusion, infiltrates or atelectasis.   Assessment / Plan / Recommendation Clinical Impression  Pt was presented with thin liquids and solids during the session. Pt demonstrated swallow function within normal limits, however she expressed preference for mechanical soft diet. No s/s observed. Pt appeared fatigued with increase work of breathing. SLP educated pt on relationship between COPD and dysphagia. Pt and daughter were educated on compensatory strategies for rest breaks if needed, small bites/sips and slow rate. SLP will f/u to determine whether diet will be upgraded in the coming days.    Aspiration Risk        Diet Recommendation Dysphagia 3 (Mech soft);Thin liquid   Liquid Administration via: Cup;No straw Medication Administration: Whole meds with liquid Supervision: Patient able to self feed;Intermittent supervision to cue for compensatory strategies Compensations: Slow rate;Small sips/bites (rest breaks) Postural Changes: Seated upright at 90 degrees    Other  Recommendations Oral Care Recommendations: Oral care BID   Follow up Recommendations   (TBD)    Frequency and Duration min 1 x/week  1 week       Prognosis Prognosis for Safe Diet Advancement: Good      Swallow Study   General HPI: Pt is a 75 y.o. female with a h/o COPD, hypertension, MI, recurrent URI and DM. Pt presented to ED 1/9 in severe respiratory distress and was intubated upon arrival. Pt was extubated 1/11 and uses nasal cannula currently. CXR 1/10 shows no pleural effusion, infiltrates or atelectasis. Type of Study: Bedside Swallow Evaluation Diet Prior to this Study: Thin liquids (Clear liquids) Temperature Spikes Noted: No Respiratory Status: Nasal cannula History of Recent Intubation: Yes Length of Intubations (days): 2 days Date extubated: 10/14/15 Behavior/Cognition: Alert;Cooperative;Pleasant mood Oral Cavity Assessment: Within Functional Limits Oral Care Completed by SLP: No Oral Cavity - Dentition: Dentures, top;Dentures, bottom Vision: Functional for self-feeding Self-Feeding Abilities: Needs set up;Needs assist Patient Positioning: Upright in bed Baseline Vocal Quality: Normal Volitional Cough: Strong (caused reflexive coughs) Volitional Swallow: Able to elicit    Oral/Motor/Sensory Function Overall Oral Motor/Sensory Function: Within functional limits   Ice  Chips Ice chips: Not tested   Thin Liquid Thin Liquid: Within functional limits Presentation: Cup;Straw;Self Fed    Nectar Thick Nectar Thick Liquid: Not tested   Honey Thick Honey Thick Liquid: Not tested   Puree Puree: Not tested    Solid   GO    Solid: Within functional limits Presentation: Spoon Other Comments: Pt communicated preference to Dysphagia 3 diet due to her recent extubation.       Titus Mould 10/14/2015,2:12 PM

## 2015-10-14 NOTE — Progress Notes (Signed)
PULMONARY / CRITICAL CARE MEDICINE   Name: [name] MRN: [id number] DOB: 08-10-1941    ADMISSION DATE:  10/12/2015  REFERRING MD:  ED  CHIEF COMPLAINT:  Acute Respiratory Distress  SUBJECTIVE:  No overnight events. Doing very well on Precedex. Mental status significantly improved. Hopeful extubation this AM.   VITAL SIGNS: BP 163/87 mmHg  Pulse 81  Temp(Src) 97.5 F (36.4 C) (Oral)  Resp 17  Ht 5\' 1"  (1.549 m)  Wt 132 lb 11.5 oz (60.2 kg)  BMI 25.09 kg/m2  SpO2 100%  HEMODYNAMICS:    VENTILATOR SETTINGS: Vent Mode:  [-] PRVC FiO2 (%):  [40 %] 40 % Set Rate:  [16 bmp] 16 bmp Vt Set:  [500 mL] 500 mL PEEP:  [5 cmH20] 5 cmH20 Pressure Support:  [10 cmH20] 10 cmH20 Plateau Pressure:  [16 cmH20-28 cmH20] 16 cmH20  INTAKE / OUTPUT: I/O last 3 completed shifts: In: 2630.2 [I.V.:890.2; NG/GT:1590; IV Piggyback:150] Out: 1985 [id number]  PHYSICAL EXAMINATION: General: Elderly female, alert, in no distress.  Neuro: RASS 0. PERRL. Moves all extremities spontaneously.  HEENT: Moist mucus membranes, no JVD noted. ETT/OGT in place.  Cardiovascular: Mild tachycardia, no murmurs, gallops, rubs.  Lungs: Air entry equal bilaterally, no wheezes. Coarse breath sounds. No rales.  Abdomen: Soft, non-tender, non-distended, BS +.  Musculoskeletal: No LE edema.  Skin: Warm, well perfused, no rash.   LABS:  BMET  Recent Labs Lab 10/12/15 0422 10/13/15 0243 10/14/15 0217  NA 137 139 143  K 4.4 4.0 4.0  CL 90* 100* 101  CO2 36* 28 31  BUN 10 19 27*  CREATININE 0.64 0.72 0.65  GLUCOSE 273* 236* 183*    Electrolytes  Recent Labs Lab 10/12/15 0422 10/12/15 1315 10/12/15 1530 10/13/15 0243 10/14/15 0217  CALCIUM 8.7*  --   --  8.2* 8.3*  MG  --  2.1  --  2.0  --   PHOS  --  <1.0* <1.0* 2.4*  --     CBC  Recent Labs Lab 10/12/15 0422 10/13/15 0243 10/14/15 0217  WBC 9.7 20.1* 23.5*  HGB 12.4 10.7* 11.2*  HCT 38.9 34.2* 34.4*  PLT 180 165 168     Sepsis Markers  Recent Labs Lab 10/12/15 0739 10/12/15 1315 10/13/15 0243 10/14/15 0217  LATICACIDVEN 3.3* 4.4* 2.4*  --   PROCALCITON  --  0.70 0.82 0.48    ABG  Recent Labs Lab 10/12/15 0700 10/12/15 0755 10/12/15 0932  PHART 7.156* 7.174* 7.328*  PCO2ART 99.8* 94.3* 55.0*  PO2ART 85.0 58.0* 205.0*    Liver Enzymes  Recent Labs Lab 10/13/15 0243  AST 37  ALT 16  ALKPHOS 55  BILITOT 0.2*  ALBUMIN 3.1*    Cardiac Enzymes  Recent Labs Lab 10/12/15 1813 10/12/15 2253 10/13/15 0243  TROPONINI 0.08* 0.09* 0.07*    Glucose  Recent Labs Lab 10/13/15 0832 10/13/15 1137 10/13/15 1606 10/13/15 2029 10/13/15 2340 10/14/15 0352  GLUCAP 219* 173* 194* 274* 264* 198*    Imaging No results found.   STUDIES:  CXR 1/9 >> Left base atelectasis  CULTURES: Blood x 2 01/09 >> Urine 01/09 >> Sputum 01/09 >> Pseudomonas Resp Virus Panel 01/09 >>   ANTIBIOTICS: Rocephin 1/9 >> 1/9 Azithro 1/9 >> 1/9 Levaquin 1/10 >>   SIGNIFICANT EVENTS: 01/05: SOB 01/08: Worsening SOB and AMS > ED 01/09: No improvement on BiPAP > Intubated > ICU  LINES/TUBES: ETT 1/9 >> PIV  DISCUSSION: 75 y.o. female w/ PMHx of COPD on home O2 (  2L), CAD s/p stent x2, HTN, HLD, DM type II, admitted for acute respiratory failure 2/2 COPD exacerbation vs viral pneumonitis.   ASSESSMENT / PLAN:  PULMONARY A: Acute on Chronic Respiratory Failure COPD Exacerbation ?HCAP vs Pseudomonas Colonization P:   Full vent support SBT peep 5 ps 5; hopeful extubation this AM Decrease SoluMedrol to 60 mg q8h Duonebs scheduled/prn See ID  CARDIOVASCULAR A:  CAD s/p stent x2 Tachycardia P:  Continue telemetry Resume ASA when able  RENAL A:   Elevated BUN, likely 2/2 steroids P:   Repeat BMP in AM  GASTROINTESTINAL A:   SUP Nutrition P:   Protonix Hold tube feeds for possible extubation  HEMATOLOGIC A:   Leukocytosis; 2/2 infection vs steroids Macrocytic  Anemia P:  Repeat CBC in AM  INFECTIOUS A:   ?HCAP; respiratory cultures positive for Psuedomonas P:   Continue Levaquin  Follow sensitivities  ENDOCRINE A:   DM type II w/ hyperglycemia P:   ISS q4h  NEUROLOGIC A:   Acute Encephalopathy 2/2 Hypercapnea P:   RASS goal: 0 Precedex gtt for sedation Add back home prn Klonopin if extubated  FAMILY  - Updates: Husband updated at bedside 1/10   Natasha Bence, MD PGY-3, Internal Medicine Pager: [phone]   10/14/2015, 8:11 AM

## 2015-10-14 NOTE — Progress Notes (Signed)
NT in room assisting to use BSC. Patient desated to 60% on 4LNC. Increased o2 to 100% on NRB and assisted to bed. Reduced o2 to 4LNC after appx 10 minutes. Will monitor closely. MD made aware. Family at bedside.

## 2015-10-14 NOTE — Procedures (Signed)
Extubation Procedure Note  Patient Details:   Name: BANDY TORRENS DOB: 1941/05/08 MRN: [id number]   Airway Documentation:     Evaluation  O2 sats: stable throughout Complications: No apparent complications Patient did tolerate procedure well. Bilateral Breath Sounds: Diminished Suctioning: Airway Yes  Placed on 4L Powers IS instructed  Revonda Standard 10/14/2015, 9:31 AM

## 2015-10-15 LAB — CULTURE, RESPIRATORY

## 2015-10-15 LAB — GLUCOSE, CAPILLARY
GLUCOSE-CAPILLARY: 204 mg/dL — AB (ref 65–99)
GLUCOSE-CAPILLARY: 150 mg/dL — AB (ref 65–99)
GLUCOSE-CAPILLARY: 288 mg/dL — AB (ref 65–99)
Glucose-Capillary: 226 mg/dL — ABNORMAL HIGH (ref 65–99)

## 2015-10-15 LAB — CBC
HEMATOCRIT: 38.6 % (ref 36.0–46.0)
Hemoglobin: 12.2 g/dL (ref 12.0–15.0)
MCH: 32.2 pg (ref 26.0–34.0)
MCHC: 31.6 g/dL (ref 30.0–36.0)
MCV: 101.8 fL — ABNORMAL HIGH (ref 78.0–100.0)
PLATELETS: 202 10*3/uL (ref 150–400)
RBC: 3.79 MIL/uL — ABNORMAL LOW (ref 3.87–5.11)
RDW: 13.2 % (ref 11.5–15.5)
WBC: 28.3 10*3/uL — AB (ref 4.0–10.5)

## 2015-10-15 LAB — CULTURE, RESPIRATORY W GRAM STAIN

## 2015-10-15 LAB — BASIC METABOLIC PANEL
ANION GAP: 8 (ref 5–15)
BUN: 13 mg/dL (ref 6–20)
CALCIUM: 8.6 mg/dL — AB (ref 8.9–10.3)
CO2: 36 mmol/L — ABNORMAL HIGH (ref 22–32)
CREATININE: 0.61 mg/dL (ref 0.44–1.00)
Chloride: 97 mmol/L — ABNORMAL LOW (ref 101–111)
Glucose, Bld: 269 mg/dL — ABNORMAL HIGH (ref 65–99)
Potassium: 4.4 mmol/L (ref 3.5–5.1)
SODIUM: 141 mmol/L (ref 135–145)

## 2015-10-15 MED ORDER — GLUCERNA SHAKE PO LIQD
237.0000 mL | Freq: Three times a day (TID) | ORAL | Status: DC
  Administered 2015-10-16 – 2015-10-19 (×6): 237 mL via ORAL

## 2015-10-15 MED ORDER — MELATONIN 3 MG PO TABS
3.0000 mg | ORAL_TABLET | Freq: Every day | ORAL | Status: DC
  Administered 2015-10-15 – 2015-10-18 (×4): 3 mg via ORAL
  Filled 2015-10-15 (×5): qty 1

## 2015-10-15 MED ORDER — ASPIRIN EC 81 MG PO TBEC
81.0000 mg | DELAYED_RELEASE_TABLET | Freq: Every day | ORAL | Status: DC
  Administered 2015-10-18: 81 mg via ORAL
  Filled 2015-10-15 (×5): qty 1

## 2015-10-15 MED ORDER — LEVOFLOXACIN 750 MG PO TABS
750.0000 mg | ORAL_TABLET | Freq: Every day | ORAL | Status: AC
  Administered 2015-10-16 – 2015-10-19 (×4): 750 mg via ORAL
  Filled 2015-10-15 (×4): qty 1

## 2015-10-15 MED ORDER — AMLODIPINE BESYLATE 5 MG PO TABS
5.0000 mg | ORAL_TABLET | Freq: Every day | ORAL | Status: DC
  Administered 2015-10-15 – 2015-10-19 (×5): 5 mg via ORAL
  Filled 2015-10-15 (×6): qty 1

## 2015-10-15 MED ORDER — METHYLPREDNISOLONE SODIUM SUCC 125 MG IJ SOLR
60.0000 mg | Freq: Two times a day (BID) | INTRAMUSCULAR | Status: DC
  Administered 2015-10-15 – 2015-10-16 (×3): 60 mg via INTRAVENOUS
  Filled 2015-10-15 (×3): qty 2

## 2015-10-15 NOTE — Progress Notes (Signed)
Inpatient Diabetes Program Recommendations  AACE/ADA: New Consensus Statement on Inpatient Glycemic Control (2015)  Target Ranges:  Prepandial:   less than 140 mg/dL      Peak postprandial:   less than 180 mg/dL (1-2 hours)      Critically ill patients:  140 - 180 mg/dL  Results for [name], [name] (MRN [id number]) as of 10/15/2015 11:29  Ref. Range 10/14/2015 08:24 10/14/2015 11:34 10/14/2015 16:06 10/14/2015 22:59 10/15/2015 08:29  Glucose-Capillary Latest Ref Range: 65-99 mg/dL 245 (H) 92 163 (H) 279 (H) 226 (H)   Review of Glycemic Control  Current orders for Inpatient glycemic control: Novolog 0-15 units TID with meals, Novolog 0-5 units HS  Inpatient Diabetes Program Recommendations: Insulin - Meal Coverage: If patient is eating at least 50% of meals, please consider ordering Novolog 4 units TID with meals for meal coverage (in addition to Novolog correction scale).  Thanks, Barnie Alderman, RN, MSN, CDE Diabetes Coordinator Inpatient Diabetes Program [phone] (Team Pager from [place] to [place]) [phone] ([facility] office) [phone] [facility] office) [phone] [facility] office)

## 2015-10-15 NOTE — Progress Notes (Signed)
PT Cancellation Note  Patient Details Name: [name] MRN: [id number] DOB: July 10, 1941   Cancelled Treatment:    Reason Eval/Treat Not Completed: Patient not medically ready Pt on active bedrest at this time. Will await increase in activity orders prior to initiation of PT eval.   [name] [name] 10/15/2015, 7:52 AM Wray Kearns, PT, DPT [phone]

## 2015-10-15 NOTE — Progress Notes (Signed)
Speech Language Pathology Treatment: Dysphagia  Patient Details Name: [name] MRN: [id number] DOB: [birth year] Today's Date: 10/15/2015 Time: 9381-0175 SLP Time Calculation (min) (ACUTE ONLY): 18 min  Assessment / Plan / Recommendation Clinical Impression  SLP provided skilled observation of thin liquids and solids. No s/s of oral and pharyngeal phase deficits were observed. No cueing was necessary. Fatigue and shortness of breath was noted after intake of solids. Pt was advised that shortness of breath could increase aspiration risk. Pt and spouse were educated on diet recommendations and compensatory strategies such as small sips/bites and slow intake. Pt was advised of precautions for straw use. Pt will be d/c from tx.   HPI HPI: Pt is a 75 y.o. female with a h/o COPD, hypertension, MI, recurrent URI and DM. Pt presented to ED 1/9 in severe respiratory distress and was intubated upon arrival. Pt was extubated 1/11 and uses nasal cannula currently. CXR 1/10 shows no pleural effusion, infiltrates or atelectasis.      SLP Plan  All goals met     Recommendations  Diet recommendations: Regular;Thin liquid Liquids provided via: Cup;Straw (Pt advised on straw precautions) Medication Administration: Whole meds with liquid Supervision: Patient able to self feed Compensations: Slow rate;Small sips/bites              Oral Care Recommendations: Oral care BID Follow up Recommendations: None Plan: All goals met   Titus Mould 10/15/2015, 11:07 AM   Titus Mould, Student-SLP

## 2015-10-15 NOTE — Care Management Important Message (Signed)
Important Message  Patient Details  Name: [name] MRN: [id number] Date of Birth: 10-Mar-1941   Medicare Important Message Given:  Yes    Nathen May 10/15/2015, 12:24 PM

## 2015-10-15 NOTE — Progress Notes (Signed)
Nutrition Follow-up  DOCUMENTATION CODES:   Not applicable  INTERVENTION:    Glucerna Shake PO TID, each supplement provides 220 kcal and 10 grams of protein  NUTRITION DIAGNOSIS:   Inadequate oral intake related to poor appetite as evidenced by meal completion < 50%.  Ongoing  GOAL:   Patient will meet greater than or equal to 90% of their needs  Unmet  MONITOR:   PO intake, Supplement acceptance, Labs, Weight trends  ASSESSMENT:   75 yo White female with a h/o COPD on home O2 [place] 2L, CAD s/p MI with stents x2, hypertension, hyperlipidemia and Type 2 DM who presents with worsening shortness of breath and AMS.   Patient was intubated 1/9-1/11. TF off since extubation. SLP following. Diet upgraded to regular consistency with thin liquids today. Patient consuming ~25% of meals.   Diet Order:  Diet Carb Modified Fluid consistency:: Thin; Room service appropriate?: Yes  Skin:  Reviewed, no issues  Last BM:  1/8  Height:   Ht Readings from Last 1 Encounters:  10/12/15 5\' 1"  (1.549 m)    Weight:   Wt Readings from Last 1 Encounters:  10/15/15 123 lb 7.3 oz (56 kg)    Ideal Body Weight:  47.7 kg  BMI:  Body mass index is 23.34 kg/(m^2).  Estimated Nutritional Needs:   Kcal:  1400-1600  Protein:  70-80 gm  Fluid:  1.4-1.6 L  EDUCATION NEEDS:   No education needs identified at this time  Molli Barrows, [place], [place], [place] Pager [phone] After Hours Pager [phone]

## 2015-10-15 NOTE — Progress Notes (Signed)
PULMONARY / CRITICAL CARE MEDICINE   Name: [name] MRN: [id number] DOB: Jul 08, 1941    ADMISSION DATE:  10/12/2015  REFERRING MD:  ED  CHIEF COMPLAINT:  Acute Respiratory Distress  SUBJECTIVE:  Doing very well this AM, says she was unable to sleep however. Breathing seems to be at baseline. On [place].   VITAL SIGNS: BP 174/90 mmHg  Pulse 108  Temp(Src) 98.1 F (36.7 C) (Oral)  Resp 15  Ht 5\' 1"  (1.549 m)  Wt 123 lb 7.3 oz (56 kg)  BMI 23.34 kg/m2  SpO2 99%  HEMODYNAMICS:    VENTILATOR SETTINGS: Vent Mode:  [-] CPAP FiO2 (%):  [40 %] 40 % PEEP:  [5 cmH20] 5 cmH20 Pressure Support:  [5 cmH20] 5 cmH20 Plateau Pressure:  [8 cmH20] 8 cmH20  INTAKE / OUTPUT: I/O last 3 completed shifts: In: 2714.4 [P.O.:360; I.V.:664.4; NG/GT:1390; IV Piggyback:300] Out: [id number] [Urine:3105]  PHYSICAL EXAMINATION: General: Elderly female, alert, in no distress.  Neuro:  PERRL, EOMI. Moves all extremities spontaneously.  HEENT: Moist mucus membranes, no JVD noted.  Cardiovascular: RRR, no murmurs, gallops, rubs.  Lungs: Air entry equal bilaterally, no wheezes, rales, or rhonchi.  Abdomen: Soft, non-tender, non-distended, BS +.  Musculoskeletal: No LE edema.  Skin: Warm, well perfused, no rash.   LABS:  BMET  Recent Labs Lab 10/13/15 0243 10/14/15 0217 10/15/15 0330  NA 139 143 141  K 4.0 4.0 4.4  CL 100* 101 97*  CO2 28 31 36*  BUN 19 27* 13  CREATININE 0.72 0.65 0.61  GLUCOSE 236* 183* 269*    Electrolytes  Recent Labs Lab 10/12/15 1315 10/12/15 1530 10/13/15 0243 10/14/15 0217 10/15/15 0330  CALCIUM  --   --  8.2* 8.3* 8.6*  MG 2.1  --  2.0  --   --   PHOS <1.0* <1.0* 2.4*  --   --     CBC  Recent Labs Lab 10/13/15 0243 10/14/15 0217 10/15/15 0330  WBC 20.1* 23.5* 28.3*  HGB 10.7* 11.2* 12.2  HCT 34.2* 34.4* 38.6  PLT 165 168 202    Sepsis Markers  Recent Labs Lab 10/12/15 0739 10/12/15 1315 10/13/15 0243 10/14/15 0217  LATICACIDVEN 3.3*  4.4* 2.4*  --   PROCALCITON  --  0.70 0.82 0.48    ABG  Recent Labs Lab 10/12/15 0700 10/12/15 0755 10/12/15 0932  PHART 7.156* 7.174* 7.328*  PCO2ART 99.8* 94.3* 55.0*  PO2ART 85.0 58.0* 205.0*    Liver Enzymes  Recent Labs Lab 10/13/15 0243  AST 37  ALT 16  ALKPHOS 55  BILITOT 0.2*  ALBUMIN 3.1*    Cardiac Enzymes  Recent Labs Lab 10/12/15 1813 10/12/15 2253 10/13/15 0243  TROPONINI 0.08* 0.09* 0.07*    Glucose  Recent Labs Lab 10/13/15 2340 10/14/15 0352 10/14/15 0824 10/14/15 1134 10/14/15 1606 10/14/15 2259  GLUCAP 264* 198* 245* 92 163* 279*    Imaging Dg Chest Port 1 View  10/14/2015  CLINICAL DATA:  Respiratory failure. EXAM: PORTABLE CHEST 1 VIEW COMPARISON:  10/13/2015.  08/19/2014 . FINDINGS: Mediastinum hilar structures stable. Heart size stable. No focal infiltrate. No pleural effusion or pneumothorax. No acute bony abnormality. IMPRESSION: 1. Lines and tubes in stable position. 2. No acute cardiopulmonary disease. Electronically Signed   By: Marcello Moores  Register   On: 10/14/2015 08:20     STUDIES:  CXR 1/9 >> Left base atelectasis  CULTURES: Blood x 2 01/09 >> Urine 01/09 >> Sputum 01/09 >> Pseudomonas Resp Virus Panel 01/09 >> RSV  A   ANTIBIOTICS: Rocephin 1/9 >> 1/9 Azithro 1/9 >> 1/9 Levaquin 1/10 >>   SIGNIFICANT EVENTS: 01/05: SOB 01/08: Worsening SOB and AMS > ED 01/09: No improvement on BiPAP > Intubated > ICU  LINES/TUBES: ETT 1/9 >> 1/11 PIV  DISCUSSION: 75 y.o. female w/ PMHx of COPD on home O2 (2L), CAD s/p stent x2, HTN, HLD, DM type II, admitted for acute respiratory failure 2/2 COPD exacerbation vs viral pneumonitis.   ASSESSMENT / PLAN:  PULMONARY A: Acute on Chronic Respiratory Failure COPD Exacerbation HCAP  P:   Decrease SoluMedrol to 60 mg q12h Duonebs scheduled/prn See ID  CARDIOVASCULAR A:  CAD s/p stent x2 Tachycardia P:  Continue telemetry Resume ASA  RENAL A:   Elevated BUN,  likely 2/2 steroids P:   Repeat BMP in AM  GASTROINTESTINAL A:   SUP Nutrition P:   Discontinue Protonix Dys 3 diet  HEMATOLOGIC A:   Leukocytosis; 2/2 infection vs steroids Macrocytic Anemia P:  Repeat CBC in AM Check B12  INFECTIOUS A:   ?HCAP; respiratory cultures positive for Pseudomonas P:   Continue Levaquin  Follow sensitivities  ENDOCRINE A:   DM type II w/ hyperglycemia P:    ISS   NEUROLOGIC A:   Acute Encephalopathy 2/2 Hypercapnea; resolved P:   Add back home prn Klonopin  FAMILY  - Updates: Husband updated at bedside 1/11   Natasha Bence, MD PGY-3, Internal Medicine Pager: [phone]   10/15/2015, 7:00 AM

## 2015-10-16 ENCOUNTER — Other Ambulatory Visit: Payer: Self-pay

## 2015-10-16 DIAGNOSIS — J189 Pneumonia, unspecified organism: Secondary | ICD-10-CM

## 2015-10-16 LAB — GLUCOSE, CAPILLARY
GLUCOSE-CAPILLARY: 291 mg/dL — AB (ref 65–99)
GLUCOSE-CAPILLARY: 159 mg/dL — AB (ref 65–99)
Glucose-Capillary: 211 mg/dL — ABNORMAL HIGH (ref 65–99)
Glucose-Capillary: 221 mg/dL — ABNORMAL HIGH (ref 65–99)

## 2015-10-16 LAB — CBC
HEMATOCRIT: 39.9 % (ref 36.0–46.0)
Hemoglobin: 12.9 g/dL (ref 12.0–15.0)
MCH: 33 pg (ref 26.0–34.0)
MCHC: 32.3 g/dL (ref 30.0–36.0)
MCV: 102 fL — ABNORMAL HIGH (ref 78.0–100.0)
PLATELETS: 186 10*3/uL (ref 150–400)
RBC: 3.91 MIL/uL (ref 3.87–5.11)
RDW: 13.2 % (ref 11.5–15.5)
WBC: 23.4 10*3/uL — AB (ref 4.0–10.5)

## 2015-10-16 LAB — VITAMIN B12: VITAMIN B 12: 1394 pg/mL — AB (ref 180–914)

## 2015-10-16 MED ORDER — PANTOPRAZOLE SODIUM 40 MG PO TBEC
40.0000 mg | DELAYED_RELEASE_TABLET | Freq: Every day | ORAL | Status: DC
  Administered 2015-10-16 – 2015-10-19 (×4): 40 mg via ORAL
  Filled 2015-10-16 (×4): qty 1

## 2015-10-16 MED ORDER — PREDNISONE 50 MG PO TABS
60.0000 mg | ORAL_TABLET | Freq: Every day | ORAL | Status: DC
  Administered 2015-10-17: 60 mg via ORAL
  Filled 2015-10-16 (×3): qty 1

## 2015-10-16 NOTE — Progress Notes (Signed)
PULMONARY / CRITICAL CARE MEDICINE   Name: [name] MRN: [id number] DOB: [birth year]    ADMISSION DATE:  10/12/2015  REFERRING MD:  ED  CHIEF COMPLAINT:  Acute Respiratory Distress  STUDIES:  CXR 1/9 >> Left base atelectasis  CULTURES: Blood x 2 01/09 >> Urine 01/09: Negative Sputum 01/09: Pseudomonas Resp Virus Panel 01/09: RSV A   ANTIBIOTICS: Levaquin 1/10 >>  Rocephin 1/9  Azithro 1/9   SIGNIFICANT EVENTS: 01/05: SOB 01/08: Worsening SOB and AMS > ED 01/09: No improvement on BiPAP > Intubated > ICU  LINES/TUBES: PIV X2 OETT 1/9 - 1/11 (self-extubated)  SUBJECTIVE: Patient reports dyspnea is improving. Reports mild intermittent coughing. No subjective fever or chills. Patient is reporting some tremors.  REVIEW OF SYSTEMS: No chest pain or pressure. No nausea or vomiting. Tolerating diet.  VITAL SIGNS: BP 149/59 mmHg  Pulse 110  Temp(Src) 97.6 F (36.4 C) (Oral)  Resp 16  Ht 5\' 1"  (1.549 m)  Wt 120 lb 13 oz (54.8 kg)  BMI 22.84 kg/m2  SpO2 93%  HEMODYNAMICS:    VENTILATOR SETTINGS:    INTAKE / OUTPUT: I/O last 3 completed shifts: In: 372 [P.O.:222; IV Piggyback:150] Out: [id number] [Urine:4050]  PHYSICAL EXAMINATION: Gen.: Patient sitting up in chair. No distress. Daughter bedside. Integument: Warm and dry. No rash on exposed skin. Pulmonary: Symmetrically decreased breath sounds. No wheezing on auscultation. Normal work of breathing on nasal cannula oxygen. Speaking in complete sentences. Cardiovascular: Regular rate. No appreciable JVD. Normal S1 & S2. Abdomen: Soft. Nontender. Normal bowel sounds.  LABS:  BMET  Recent Labs Lab 10/13/15 0243 10/14/15 0217 10/15/15 0330  NA 139 143 141  K 4.0 4.0 4.4  CL 100* 101 97*  CO2 28 31 36*  BUN 19 27* 13  CREATININE 0.72 0.65 0.61  GLUCOSE 236* 183* 269*    Electrolytes  Recent Labs Lab 10/12/15 1315 10/12/15 1530 10/13/15 0243 10/14/15 0217 10/15/15 0330  CALCIUM  --   --  8.2*  8.3* 8.6*  MG 2.1  --  2.0  --   --   PHOS <1.0* <1.0* 2.4*  --   --     CBC  Recent Labs Lab 10/14/15 0217 10/15/15 0330 10/16/15 0318  WBC 23.5* 28.3* 23.4*  HGB 11.2* 12.2 12.9  HCT 34.4* 38.6 39.9  PLT 168 202 186    Sepsis Markers  Recent Labs Lab 10/12/15 0739 10/12/15 1315 10/13/15 0243 10/14/15 0217  LATICACIDVEN 3.3* 4.4* 2.4*  --   PROCALCITON  --  0.70 0.82 0.48    ABG  Recent Labs Lab 10/12/15 0700 10/12/15 0755 10/12/15 0932  PHART 7.156* 7.174* 7.328*  PCO2ART 99.8* 94.3* 55.0*  PO2ART 85.0 58.0* 205.0*    Liver Enzymes  Recent Labs Lab 10/13/15 0243  AST 37  ALT 16  ALKPHOS 55  BILITOT 0.2*  ALBUMIN 3.1*    Cardiac Enzymes  Recent Labs Lab 10/12/15 1813 10/12/15 2253 10/13/15 0243  TROPONINI 0.08* 0.09* 0.07*    Glucose  Recent Labs Lab 10/14/15 2259 10/15/15 0829 10/15/15 1200 10/15/15 1628 10/15/15 2218 10/16/15 0557  GLUCAP 279* 226* 204* 150* 288* 211*    Imaging No results found.   ASSESSMENT / PLAN:  75 y.o. female w/ PMHx of COPD on home O2 (2L), CAD s/p stent x2, HTN, HLD, DM type II, admitted for acute respiratory failure 2/2 COPD exacerbation vs viral pneumonitis. Patient clinically improving. Suspect some of the tremor she is reporting is secondary to IV steroid therapy.  Serum glucose seems to be stable despite IV Solu-Medrol therapy. The patient's leukocytosis is likely multifactorial with her underlying healthcare associated pneumonia and steroid therapy. Patient's respiratory status remains somewhat tenuous.  1. Acute Hypoxic Respiratory Failure: Clinically improving. Continuing to wean FiO2 for saturation greater than 90%. Continuing pulmonary toilet. 2. Acute on Chronic Hyperbcarbic Respiratory Failure/COPD Exacerbation: Discontinuing Slu-Medrol 60mg  IV q12hr. Switching to prednisone 60 mg by mouth daily starting tomorrow. Continuing DuoNeb every 6 hours.  3. HCAP:  Cultures positive for Pseudomonas &  Respiratory Panel positive for RSV. Levaquin Day #4 /7. Trending leukocytosis. 4. DM Type II:  Reasonable blood glucose control. Accu-Checks qAC & HS with SSI coverage. 5. H/O CAD:  ASA 81mg  daily. 6. H/O HTN:  Norvasc 5mg  daily started 1/12. Hydralazine IV prn. 7. Acute Encephalopathy:  Secondary to hypercarbia. Resolved. 8. H/O Anxiety:  Resumed home Klonopin 1/12. 9. Prophylaxis:  Heparin [place] q8hr, SCDs, & Protonix by mouth daily. 10. Diet: Carbohydrate modified diet.  Sonia Baller Ashok Cordia, M.D. [facility] Pulmonary & Critical Care Pager:  [phone] After 3pm or if no response, call [phone] 10/16/2015, 7:09 AM

## 2015-10-16 NOTE — Care Management Note (Signed)
Case Management Note  Patient Details  Name: [name] MRN: [id number] Date of Birth: [birth year]  Subjective/Objective:        Admitted with Respiratory Failure with Hypoxia            Action/Plan: Patient lives at home with spouse, has home 0xygen through [employer];  PCP is Dr Gertie Exon / Pulmonologist is Dr Annamaria Boots Patient could benefit from a Disease Management program for COPD, pt is refusing all [place] at this time; CM informed patient that if she changed her mind, her primary care physician could make the arrangements from her home.  Expected Discharge Date:   possibly 1/ 16/2017             Expected Discharge Plan:  Home/Self Care  Discharge planning Services  CM Consult    Choice offered to:  Patient  HH Arranged:  Patient Refused  Status of Service:  In process, will continue to follow  Medicare Important Message Given:  Yes  Sherrilyn Rist [phone] 10/16/2015, 3:04 PM

## 2015-10-16 NOTE — Progress Notes (Signed)
Results for ELLY, SALIDO (MRN [id number]) as of 10/16/2015 14:31  Ref. Range 10/15/2015 12:00 10/15/2015 16:28 10/15/2015 22:18 10/16/2015 05:57 10/16/2015 11:23  Glucose-Capillary Latest Ref Range: 65-99 mg/dL 204 (H) 150 (H) 288 (H) 211 (H) 291 (H)  Noted that postprandial blood sugars continue to be elevated. Recommend adding Novolog 4 units TID with meals if eating at least 50% of meal. This would be in addition to the correction scale. Will continue to monitor while in hospital. Harvel Ricks RN BSN CDE

## 2015-10-16 NOTE — Evaluation (Signed)
Physical Therapy Evaluation Patient Details Name: KALEIAH HARDEY MRN: [id number] DOB: 1941-04-07 Today's Date: 10/16/2015   History of Present Illness  Ms. Cheatham is a 75 yo [place] female with a h/o COPD (followed CY) on hone O2 [place] 2L, CAD s/p MI with stents x2, hypertension, hyperlipidemia and Type 2 DM who presents with worsening shortness of breath and AMS.  Patient intubated 1/9-1/11/17.  Clinical Impression  Patient presents with decreased mobility due to deficits listed in PT problem list.  Noteably limited by weakness and decreased balance.  Was functioning independent in the home short distances with O2, now needing walker and support for safety.  Feel skilled acute level PT indicated to address deficits and allow d/c home with family assist and HHPT.    Follow Up Recommendations Home health PT;Supervision/Assistance - 24 hour    Equipment Recommendations  Rolling walker with 5" wheels    Recommendations for Other Services       Precautions / Restrictions Precautions Precautions: Fall Precaution Comments: O2 dependent      Mobility  Bed Mobility Overal bed mobility: Needs Assistance Bed Mobility: Supine to Sit     Supine to sit: HOB elevated;Supervision     General bed mobility comments: assist for safety, environmental set up  Transfers Overall transfer level: Needs assistance Equipment used: Rolling walker (2 wheeled) Transfers: Sit to/from [facility] Sit to Stand: Supervision;Min guard Stand pivot transfers: Min guard       General transfer comment: bed to [facility] minguard for safety (though pt reports did it earlier unaided), supervision sit to stand, but pt reaching for hand hold on BSC, then walker, so minguard for safety  Ambulation/Gait Ambulation/Gait assistance: Min guard Ambulation Distance (Feet): 150 Feet Assistive device: Rolling walker (2 wheeled)          Stairs            Wheelchair Mobility    Modified Rankin  (Stroke Patients Only)       Balance Overall balance assessment: Needs assistance   Sitting balance-Leahy Scale: Good       Standing balance-Leahy Scale: Poor Standing balance comment: reaching for UE support when standing                             Pertinent Vitals/Pain Pain Assessment: No/denies pain    Home Living Family/patient expects to be discharged to:: Private residence Living Arrangements: Spouse/significant other Available Help at Discharge: Family;Available PRN/intermittently Type of Home: House Home Access: Stairs to enter Entrance Stairs-Rails: Psychiatric nurse of Steps: 4 Home Layout: One level Home Equipment: Other (comment);Shower seat - built in;Hand held shower head (home O2) Additional Comments: would get winded with ambulation even room to room with home O2; performed only light meal prep; spouse works, but could arrange to stay home initially after d/c.    Prior Function Level of Independence: Independent               Hand Dominance   Dominant Hand: Right    Extremity/Trunk Assessment   Upper Extremity Assessment: RUE deficits/detail;LUE deficits/detail RUE Deficits / Details: AROM WFL, strength grossly 4/5     LUE Deficits / Details: AROM WFL, strength grossly 4/5   Lower Extremity Assessment: RLE deficits/detail;LLE deficits/detail RLE Deficits / Details: AROM WFL, strength grossly 4/5 LLE Deficits / Details: AROM WFL, strength grossly 4/5     Communication   Communication: No difficulties  Cognition Arousal/Alertness: Awake/alert Behavior During  Therapy: WFL for tasks assessed/performed Overall Cognitive Status: Within Functional Limits for tasks assessed                      General Comments General comments (skin integrity, edema, etc.): SpO2 94% with ambulation on 4L, then 90% after back in room in chair    Exercises        Assessment/Plan    PT Assessment Patient needs  continued PT services  PT Diagnosis Generalized weakness;Abnormality of gait   PT Problem List Decreased strength;Decreased activity tolerance;Decreased mobility;Decreased balance;Cardiopulmonary status limiting activity;Decreased knowledge of use of DME;Decreased safety awareness  PT Treatment Interventions DME instruction;Gait training;Balance training;Stair training;Functional mobility training;Therapeutic activities;Patient/family education;Therapeutic exercise   PT Goals (Current goals can be found in the Care Plan section) Acute Rehab PT Goals Patient Stated Goal: To return home PT Goal Formulation: With patient/family Time For Goal Achievement: 10/23/15 Potential to Achieve Goals: Good    Frequency Min 3X/week   Barriers to discharge        Co-evaluation               End of Session Equipment Utilized During Treatment: Gait belt;Oxygen Activity Tolerance: Patient limited by fatigue Patient left: in chair;with call bell/phone within reach;with family/visitor present           Time: [id number] PT Time Calculation (min) (ACUTE ONLY): 35 min   Charges:   PT Evaluation $PT Eval Moderate Complexity: 1 Procedure PT Treatments $Gait Training: 8-22 mins   PT G CodesReginia Naas 10-29-2015, 11:13 AM  Magda Kiel, PT [phone] October 29, 2015

## 2015-10-16 NOTE — Consult Note (Signed)
   [facility] CM Inpatient Consult   10/16/2015  [name] Jul 18, 1941 [id number] Patient evaluated for community based chronic disease management services with [place] Management Program as a benefit of patient's [employer]. Spoke with patient and daughter at bedside to explain [place] Management services.  Patient admitted with COPD exacerbation.  History with home 02 and uses [facility] for their DME.  Patient feels like she will need some Physical Therapy stating, "I am weak and real jittery right now."  Consent form signed.  Patient endorses that Dr. Glendale Chard is her primary care provider.  Patient will receive post hospital discharge call and will be evaluated for monthly home visits for assessments and disease process education.  Left contact information and [facility] literature at bedside. Made Inpatient Case Manager aware that [place] Management following. Of note, [facility] Care Management services does not replace or interfere with any services that are arranged by inpatient case management or social work.  For additional questions or referrals please contact:   Natividad Brood, RN BSN [facility] Liaison  [phone] business mobile phone

## 2015-10-16 NOTE — Progress Notes (Signed)
Pt a/o, PRN Clonazepam given as ordered, no c/o pain, VSS, pt stable

## 2015-10-16 NOTE — Progress Notes (Signed)
Pt a/o, no c/o pain, pt requested Clonazepam, given as ordered, VSS, pt stable, pt stated she would like PT to work with her at the hospital and also would like to go home with PT, will pass on to next shift,

## 2015-10-17 LAB — CBC WITH DIFFERENTIAL/PLATELET
BASOS ABS: 0 10*3/uL (ref 0.0–0.1)
BASOS PCT: 0 %
EOS ABS: 0 10*3/uL (ref 0.0–0.7)
Eosinophils Relative: 0 %
HEMATOCRIT: 37.8 % (ref 36.0–46.0)
HEMOGLOBIN: 12 g/dL (ref 12.0–15.0)
Lymphocytes Relative: 7 %
Lymphs Abs: 1 10*3/uL (ref 0.7–4.0)
MCH: 32.7 pg (ref 26.0–34.0)
MCHC: 31.7 g/dL (ref 30.0–36.0)
MCV: 103 fL — ABNORMAL HIGH (ref 78.0–100.0)
Monocytes Absolute: 0.8 10*3/uL (ref 0.1–1.0)
Monocytes Relative: 5 %
NEUTROS ABS: 13.3 10*3/uL — AB (ref 1.7–7.7)
NEUTROS PCT: 88 %
Platelets: 207 10*3/uL (ref 150–400)
RBC: 3.67 MIL/uL — ABNORMAL LOW (ref 3.87–5.11)
RDW: 12.8 % (ref 11.5–15.5)
WBC: 15.2 10*3/uL — AB (ref 4.0–10.5)

## 2015-10-17 LAB — GLUCOSE, CAPILLARY
GLUCOSE-CAPILLARY: 246 mg/dL — AB (ref 65–99)
GLUCOSE-CAPILLARY: 348 mg/dL — AB (ref 65–99)
GLUCOSE-CAPILLARY: 127 mg/dL — AB (ref 65–99)
Glucose-Capillary: 204 mg/dL — ABNORMAL HIGH (ref 65–99)

## 2015-10-17 LAB — RENAL FUNCTION PANEL
ALBUMIN: 2.8 g/dL — AB (ref 3.5–5.0)
ANION GAP: 10 (ref 5–15)
BUN: 21 mg/dL — ABNORMAL HIGH (ref 6–20)
CHLORIDE: 93 mmol/L — AB (ref 101–111)
CO2: 38 mmol/L — AB (ref 22–32)
CREATININE: 0.72 mg/dL (ref 0.44–1.00)
Calcium: 8.8 mg/dL — ABNORMAL LOW (ref 8.9–10.3)
GFR calc non Af Amer: >60 mL/min (ref >60)
GLUCOSE: 287 mg/dL — AB (ref 65–99)
Phosphorus: 4.9 mg/dL — ABNORMAL HIGH (ref 2.5–4.6)
Potassium: 4.6 mmol/L (ref 3.5–5.1)
SODIUM: 141 mmol/L (ref 135–145)

## 2015-10-17 LAB — CULTURE, BLOOD (ROUTINE X 2)
Culture: NO GROWTH
Culture: NO GROWTH

## 2015-10-17 LAB — MAGNESIUM: MAGNESIUM: 2.2 mg/dL (ref 1.7–2.4)

## 2015-10-17 MED ORDER — PREDNISONE 20 MG PO TABS: 40.0000 mg | ORAL_TABLET | Freq: Every day | ORAL | Status: DC

## 2015-10-17 MED ORDER — GUAIFENESIN-DM 100-10 MG/5ML PO SYRP
5.0000 mL | ORAL_SOLUTION | ORAL | Status: DC | PRN
  Administered 2015-10-17: 5 mL via ORAL
  Filled 2015-10-17: qty 5

## 2015-10-17 MED ORDER — TRAMADOL HCL 50 MG PO TABS
50.0000 mg | ORAL_TABLET | Freq: Four times a day (QID) | ORAL | Status: DC | PRN
Start: 2015-10-17 — End: 2015-10-19

## 2015-10-17 NOTE — Progress Notes (Signed)
PULMONARY / CRITICAL CARE MEDICINE   Name: [name] MRN: [id number] DOB: 05-02-1941    ADMISSION DATE:  10/12/2015  REFERRING MD:  ED  CHIEF COMPLAINT:  Acute Respiratory Distress  STUDIES:  CXR 1/9 >> Left base atelectasis  CULTURES: Blood x 2 01/09 >> Urine 01/09: Negative Sputum 01/09: Pseudomonas Resp Virus Panel 01/09: RSV A   ANTIBIOTICS: Levaquin 1/10 >>  Rocephin 1/9  Azithro 1/9   SIGNIFICANT EVENTS: 01/05: SOB 01/08: Worsening SOB and AMS > ED 01/09: No improvement on BiPAP > Intubated > ICU  LINES/TUBES: PIV X2 OETT 1/9 - 1/11 (self-extubated)  SUBJECTIVE: Daughter here. Very weak. Minimal sputum  VITAL SIGNS: BP 109/84 mmHg  Pulse 118  Temp(Src) 98 F (36.7 C) (Oral)  Resp 18  Ht 5\' 1"  (1.549 m)  Wt 54.568 kg (120 lb 4.8 oz)  BMI 22.74 kg/m2  SpO2 98%  HEMODYNAMICS:    VENTILATOR SETTINGS:    INTAKE / OUTPUT: I/O last 3 completed shifts: In: 700 [P.O.:700] Out: 1650 [Urine:1650]  PHYSICAL EXAMINATION: Gen.: Patient in bed. No distress. Daughter bedside. Integument: Warm and dry. No rash on exposed skin. Pulmonary: Symmetrically decreased breath sounds. No wheezing on auscultation. Normal work of breathing on nasal cannula oxygen. Speaking in complete sentences. Cardiovascular: Regular rate. No appreciable JVD. Normal S1 & S2. Abdomen: Soft. Nontender. Normal bowel sounds. [place]:  Needed assist to sit  LABS:  BMET  Recent Labs Lab 10/14/15 0217 10/15/15 0330 10/17/15 0348  NA 143 141 141  K 4.0 4.4 4.6  CL 101 97* 93*  CO2 31 36* 38*  BUN 27* 13 21*  CREATININE 0.65 0.61 0.72  GLUCOSE 183* 269* 287*    Electrolytes  Recent Labs Lab 10/12/15 1315 10/12/15 1530 10/13/15 0243 10/14/15 0217 10/15/15 0330 10/17/15 0348  CALCIUM  --   --  8.2* 8.3* 8.6* 8.8*  MG 2.1  --  2.0  --   --  2.2  PHOS <1.0* <1.0* 2.4*  --   --  4.9*    CBC  Recent Labs Lab 10/15/15 0330 10/16/15 0318 10/17/15 0348  WBC  28.3* 23.4* 15.2*  HGB 12.2 12.9 12.0  HCT 38.6 39.9 37.8  PLT 202 186 207    Sepsis Markers  Recent Labs Lab 10/12/15 0739 10/12/15 1315 10/13/15 0243 10/14/15 0217  LATICACIDVEN 3.3* 4.4* 2.4*  --   PROCALCITON  --  0.70 0.82 0.48    ABG  Recent Labs Lab 10/12/15 0700 10/12/15 0755 10/12/15 0932  PHART 7.156* 7.174* 7.328*  PCO2ART 99.8* 94.3* 55.0*  PO2ART 85.0 58.0* 205.0*    Liver Enzymes  Recent Labs Lab 10/13/15 0243 10/17/15 0348  AST 37  --   ALT 16  --   ALKPHOS 55  --   BILITOT 0.2*  --   ALBUMIN 3.1* 2.8*    Cardiac Enzymes  Recent Labs Lab 10/12/15 1813 10/12/15 2253 10/13/15 0243  TROPONINI 0.08* 0.09* 0.07*    Glucose  Recent Labs Lab 10/15/15 2218 10/16/15 0557 10/16/15 1123 10/16/15 1640 10/16/15 2102 10/17/15 0553  GLUCAP 288* 211* 291* 159* 221* 246*    Imaging No results found.   ASSESSMENT / PLAN:  75 y.o. female w/ PMHx of COPD on home O2 (2L), CAD s/p stent x2, HTN, HLD, DM type II, admitted for acute respiratory failure 2/2 COPD exacerbation vs viral pneumonitis. Patient clinically improving. Suspect some of the tremor she is reporting is secondary to IV steroid therapy. Serum glucose seems to be stable  despite IV Solu-Medrol therapy. The patient's leukocytosis is likely multifactorial with her underlying healthcare associated pneumonia and steroid therapy. Patient's respiratory status remains somewhat tenuous. She accepts order for home PT eval and treat after discharge, but can't travel to a site.  1. Acute Hypoxic Respiratory Failure: Clinically improving. Continuing to wean FiO2 for saturation greater than 90%. Continuing pulmonary toilet. 2. Acute on Chronic Hyperbcarbic Respiratory Failure/COPD Exacerbation: Discontinuing Slu-Medrol 60mg  IV q12hr. Switching to prednisone 40 mg by mouth daily starting 1/15. Continuing DuoNeb every 6 hours.  3. HCAP:  Cultures positive for Pseudomonas & Respiratory Panel positive  for RSV. Levaquin Day #4 /7. Trending leukocytosis. 4. DM Type II:  Reasonable blood glucose control. Accu-Checks qAC & HS with SSI coverage.Reducing prednisone 5. H/O CAD:  ASA 81mg  daily. 6. H/O HTN:  Norvasc 5mg  daily started 1/12. Hydralazine IV prn. 7. Acute Encephalopathy:  Secondary to hypercarbia. Resolved. 8. H/O Anxiety:  Resumed home Klonopin 1/12. 9. Prophylaxis:  Heparin [place] q8hr, SCDs, & Protonix by mouth daily. 10. Diet: Carbohydrate modified diet.  Bartholomew Crews, MD M.D. [facility] Pulmonary & Critical Care Pager:  [phone] After 3pm or if no response, call [phone] 10/17/2015, 10:19 AM

## 2015-10-18 LAB — GLUCOSE, CAPILLARY
GLUCOSE-CAPILLARY: 308 mg/dL — AB (ref 65–99)
Glucose-Capillary: 134 mg/dL — ABNORMAL HIGH (ref 65–99)
Glucose-Capillary: 241 mg/dL — ABNORMAL HIGH (ref 65–99)
Glucose-Capillary: 131 mg/dL — ABNORMAL HIGH (ref 65–99)

## 2015-10-18 MED ORDER — GUAIFENESIN ER 600 MG PO TB12
600.0000 mg | ORAL_TABLET | Freq: Two times a day (BID) | ORAL | Status: DC
  Administered 2015-10-18 – 2015-10-19 (×3): 600 mg via ORAL
  Filled 2015-10-18 (×3): qty 1

## 2015-10-18 MED ORDER — INSULIN ASPART 100 UNIT/ML ~~LOC~~ SOLN
0.0000 [IU] | Freq: Three times a day (TID) | SUBCUTANEOUS | Status: DC
  Administered 2015-10-18: 11 [IU] via SUBCUTANEOUS
  Administered 2015-10-18 – 2015-10-19 (×2): 2 [IU] via SUBCUTANEOUS
  Administered 2015-10-19: 5 [IU] via SUBCUTANEOUS

## 2015-10-18 MED ORDER — PREDNISONE 20 MG PO TABS
40.0000 mg | ORAL_TABLET | Freq: Every day | ORAL | Status: DC
  Administered 2015-10-18 – 2015-10-19 (×2): 40 mg via ORAL
  Filled 2015-10-18 (×2): qty 2

## 2015-10-18 NOTE — Progress Notes (Signed)
PULMONARY / CRITICAL CARE MEDICINE   Name: [name] MRN: [id number] DOB: 1941-07-12    ADMISSION DATE:  10/12/2015  REFERRING MD:  ED  CHIEF COMPLAINT:  Acute Respiratory Distress  STUDIES:  CXR 1/9 >> Left base atelectasis  CULTURES: Blood x 2 01/09 >> Urine 01/09: Negative Sputum 01/09: Pseudomonas Resp Virus Panel 01/09: RSV A   ANTIBIOTICS: Levaquin 1/10 >>  Rocephin 1/9  Azithro 1/9   SIGNIFICANT EVENTS: 01/05: SOB 01/08: Worsening SOB and AMS > ED 01/09: No improvement on BiPAP > Intubated > ICU  LINES/TUBES: PIV X2 OETT 1/9 - 1/11 (self-extubated)  SUBJECTIVE: Daughter and husband here. Pt trying to sleep after restless night.  VITAL SIGNS: BP 150/66 mmHg  Pulse 95  Temp(Src) 98.1 F (36.7 C) (Oral)  Resp 18  Ht 5\' 1"  (1.549 m)  Wt 54.613 kg (120 lb 6.4 oz)  BMI 22.76 kg/m2  SpO2 98%  HEMODYNAMICS:    VENTILATOR SETTINGS:    INTAKE / OUTPUT: I/O last 3 completed shifts: In: 79 [P.O.:820] Out: 1125 [Urine:1125]  PHYSICAL EXAMINATION: Gen.: Patient in bed. No distress. Family here.  Integument: Warm and dry. No rash on exposed skin. Pulmonary: Symmetrically decreased breath sounds. No wheezing on auscultation. Normal work of breathing on nasal cannula oxygen. Speaking in complete sentences. Woke easily w sat 100%  Cardiovascular: Regular rate. No appreciable JVD. Normal S1 & S2. Abdomen: Soft. Nontender. Normal bowel sounds. MuscSkel:  weak  LABS:  BMET  Recent Labs Lab 10/14/15 0217 10/15/15 0330 10/17/15 0348  NA 143 141 141  K 4.0 4.4 4.6  CL 101 97* 93*  CO2 31 36* 38*  BUN 27* 13 21*  CREATININE 0.65 0.61 0.72  GLUCOSE 183* 269* 287*    Electrolytes  Recent Labs Lab 10/12/15 1315 10/12/15 1530 10/13/15 0243 10/14/15 0217 10/15/15 0330 10/17/15 0348  CALCIUM  --   --  8.2* 8.3* 8.6* 8.8*  MG 2.1  --  2.0  --   --  2.2  PHOS <1.0* <1.0* 2.4*  --   --  4.9*    CBC  Recent Labs Lab 10/15/15 0330  10/16/15 0318 10/17/15 0348  WBC 28.3* 23.4* 15.2*  HGB 12.2 12.9 12.0  HCT 38.6 39.9 37.8  PLT 202 186 207    Sepsis Markers  Recent Labs Lab 10/12/15 0739 10/12/15 1315 10/13/15 0243 10/14/15 0217  LATICACIDVEN 3.3* 4.4* 2.4*  --   PROCALCITON  --  0.70 0.82 0.48    ABG  Recent Labs Lab 10/12/15 0700 10/12/15 0755 10/12/15 0932  PHART 7.156* 7.174* 7.328*  PCO2ART 99.8* 94.3* 55.0*  PO2ART 85.0 58.0* 205.0*    Liver Enzymes  Recent Labs Lab 10/13/15 0243 10/17/15 0348  AST 37  --   ALT 16  --   ALKPHOS 55  --   BILITOT 0.2*  --   ALBUMIN 3.1* 2.8*    Cardiac Enzymes  Recent Labs Lab 10/12/15 1813 10/12/15 2253 10/13/15 0243  TROPONINI 0.08* 0.09* 0.07*    Glucose  Recent Labs Lab 10/16/15 2102 10/17/15 0553 10/17/15 1136 10/17/15 1643 10/17/15 2135 10/18/15 0522  GLUCAP 221* 246* 348* 127* 204* 134*    Imaging No results found.   ASSESSMENT / PLAN:  75 y.o. female w/ PMHx of COPD on home O2 (2L), CAD s/p stent x2, HTN, HLD, DM type II, admitted for acute respiratory failure 2/2 COPD exacerbation / viral pneumonitis. Serum glucose seems to be stable despite IV Solu-Medrol therapy. The patient's leukocytosis is  likely multifactorial with her underlying healthcare associated pneumonia and steroid therapy. Patient's respiratory status remains somewhat tenuous. She accepts order for home PT eval and treat after discharge, but can't travel to a site.  1. Acute Hypoxic Respiratory Failure: Clinically improving. Continuing to wean FiO2 for saturation greater than 90%. Continuing pulmonary toilet. 2. Acute on Chronic Hyperbcarbic Respiratory Failure/COPD Exacerbation: For gradual steroid taper..  3. HCAP:  Cultures positive for Pseudomonas & Respiratory Panel positive for RSV. Levaquin Day #5 /7. Trending leukocytosis. 4. DM Type II:  Reasonable blood glucose control. Accu-Checks qAC & HS with SSI coverage.Reducing prednisone 5. H/O CAD:  ASA  81mg  daily. 6. H/O HTN:  Norvasc 5mg  daily started 1/12. Hydralazine IV prn. 7. Acute Encephalopathy:  Secondary to hypercarbia. Resolved. 8. H/O Anxiety:  Resumed home Klonopin 1/12. 9. Prophylaxis:  Heparin [place] q8hr, SCDs, & Protonix by mouth daily. 10. Diet: Carbohydrate modified diet.  Bartholomew Crews, MD M.D. [facility] Pager:  [phone] After 3pm or if no response, call [phone] 10/18/2015, 11:20 AM

## 2015-10-19 LAB — GLUCOSE, CAPILLARY
Glucose-Capillary: 142 mg/dL — ABNORMAL HIGH (ref 65–99)
Glucose-Capillary: 248 mg/dL — ABNORMAL HIGH (ref 65–99)

## 2015-10-19 MED ORDER — PREDNISONE 10 MG PO TABS: ORAL_TABLET | ORAL | Status: DC

## 2015-10-19 MED ORDER — GUAIFENESIN ER 600 MG PO TB12: 600.0000 mg | ORAL_TABLET | Freq: Two times a day (BID) | ORAL | Status: AC

## 2015-10-19 MED ORDER — LEVOFLOXACIN 750 MG PO TABS: 750.0000 mg | ORAL_TABLET | Freq: Every day | ORAL | Status: DC

## 2015-10-19 MED ORDER — FLUCONAZOLE 150 MG PO TABS: 150.0000 mg | ORAL_TABLET | Freq: Every day | ORAL | Status: DC

## 2015-10-19 MED ORDER — AMLODIPINE BESYLATE 5 MG PO TABS: 5.0000 mg | ORAL_TABLET | Freq: Every day | ORAL | Status: DC

## 2015-10-19 NOTE — Care Management Important Message (Signed)
Important Message  Patient Details  Name: [name] MRN: [id number] Date of Birth: Apr 29, 1941   Medicare Important Message Given:  Yes    Barb Merino Freida Nebel 10/19/2015, 3:44 PM

## 2015-10-19 NOTE — Discharge Summary (Addendum)
PULMONARY / CRITICAL CARE MEDICINE   Physician Discharge Summary       Patient ID: [name] MRN: [id number] DOB/AGE: 06/24/1941 75 y.o.  Admit date: 10-21-2015 75 y.o. Discharge date: 10/19/2015  Discharge Diagnoses:  Acute on Chronic Hyperbcarbic Respiratory Failure ACOPD Exacerbation.  Viral Pneumonitis  Pseudomonas Pneumonia  DM Type II H/O CAD H/O HTN:  Acute Encephalopathy H/O Anxiety Dysuria    Detailed Hospital Course:   75 y.o. female w/ PMHx of COPD on home O2 (2L), CAD s/p stent x2, HTN, HLD, DM type II, admitted for acute respiratory failure 2/2 COPD exacerbation / viral pneumonitis. Serum glucose seems to be stable despite IV Solu-Medrol therapy. She failed NIPPV and required intubation and mechanical ventilation. Her cultures grew out pseudomonas from respiratory culture and RVP was positive for RSV. She was treated w/ appropriate antibiotics, systemic steroids and scheduled BDs. She self extubated on 1/11 and was able to stay off the vent. We continued supportive care which consisted of nebs, steroids and O2 step-down. At time of d/c she feels close to baseline. She will be discharged to home w/ the following plan as outlined below.    Discharge Plan by active problems   Acute on Chronic Hyperbcarbic Respiratory Failure/COPD Exacerbation Plan:  Resume home BDs Albuterol Neb for nex 5 d f/b flutter valve gradual steroid taper.  HCAP:  Cultures positive for Pseudomonas & Respiratory Panel positive for RSV. Plan Home w/ 5 more days  Levaquin  DM Type II Plan: Reducing prednisone  H/O CAD Plan Asa 81mg  daily.  H/O HTN Plan:   Norvasc 5mg  daily started 1/12  H/O Anxiety:   Plan Resumed home Klonopin 1/12.  Dysuria : likely yeast from [place] 1x [facility] tests/ studies  Consults  STUDIES:  CXR 2022/10/20 >> Left base atelectasis  CULTURES: Blood x 2 10-20-2022 >>neg  Urine October 20, 2022: Negative Sputum 10/20/2022: Pseudomonas Resp Virus  Panel 20-Oct-2022: RSV A  NTIBIOTICS: Levaquin 1/10 >>  Rocephin October 20, 2022  Azithro 10/20/22   SIGNIFICANT EVENTS: 01/05: SOB 01/08: Worsening SOB and AMS > ED Oct 20, 2022: No improvement on BiPAP > Intubated > ICU  LINES/TUBES: PIV X2 OETT 10-20-2022 - 1/11 (self-extubated)  Discharge Exam: BP 114/94 mmHg  Pulse 102  Temp(Src) 97.3 F (36.3 C) (Oral)  Resp 18  Ht 5\' 1"  (1.549 m)  Wt 120 lb 3.2 oz (54.522 kg)  BMI 22.72 kg/m2  SpO2 95%  Gen.: Patient in chair. No distress. Family here.  Integument: Warm and dry. No rash on exposed skin. Pulmonary: Symmetrically decreased breath sounds. No wheezing on auscultation. Normal work of breathing on nasal cannula oxygen. Speaking in complete sentences. Woke easily w sat 100%  Cardiovascular: Regular rate. No appreciable JVD. Normal S1 & S2. Abdomen: Soft. Nontender. Normal bowel sounds. MuscSkel:  weak  Labs at discharge Lab Results  Component Value Date   CREATININE 0.72 10/17/2015   BUN 21* 10/17/2015   NA 141 10/17/2015   K 4.6 10/17/2015   CL 93* 10/17/2015   CO2 38* 10/17/2015   Lab Results  Component Value Date   WBC 15.2* 10/17/2015   HGB 12.0 10/17/2015   HCT 37.8 10/17/2015   MCV 103.0* 10/17/2015   PLT 207 10/17/2015   Lab Results  Component Value Date   ALT 16 10/13/2015   AST 37 10/13/2015   ALKPHOS 55 10/13/2015   BILITOT 0.2* 10/13/2015   Lab Results  Component Value Date   INR 0.9 ratio 07/06/2010  Current radiology studies No results found.  Disposition:  01-Home or Self Care      Discharge Instructions    AMB Referral to [facility]    Complete by:  As directed   Reason for consult:  COPD exacerbation, post hospital monitoring  Diagnoses of:   COPD/ Pneumonia Diabetes    Expected date of contact:  1-3 days (reserved for hospital discharges)  Please assign to community nurse for transition of care calls and assess for home visits.Patient currently uses [facility] for her home oxygen.    Questions please call: Natividad Brood, RN BSN [facility] Liaison  [phone] business mobile phone     Diet - low sodium heart healthy    Complete by:  As directed      Increase activity slowly    Complete by:  As directed             Medication List    TAKE these medications        ADVAIR DISKUS 250-50 MCG/DOSE Aepb  Generic drug:  Fluticasone-Salmeterol  INHALE 1 PUFF EVERY 12HRS     albuterol 108 (90 Base) MCG/ACT inhaler  Commonly known as:  PROAIR HFA  Inhale 2 puffs into the lungs 4 (four) times daily as needed. Rescue inhaler     Albuterol Sulfate 108 (90 Base) MCG/ACT Aepb  Commonly known as:  PROAIR RESPICLICK  Inhale 2 puffs into the lungs every 6 (six) hours as needed.     albuterol (2.5 MG/3ML) 0.083% nebulizer solution  Commonly known as:  PROVENTIL  USE 1 VIAL IN NEBULIZER EVERY 6HRS FOR WHEEZING     amLODipine 5 MG tablet  Commonly known as:  NORVASC  Take 1 tablet (5 mg total) by mouth daily.     aspirin EC 81 MG tablet  Take 81 mg by mouth at bedtime.     BIOTIN MAXIMUM STRENGTH 10 MG Tabs  Generic drug:  Biotin  Take 10 mg by mouth daily. 10,000 mcg     CINNAMON PO  Take 1,000 mg by mouth 2 (two) times daily.     clonazePAM 1 MG tablet  Commonly known as:  KLONOPIN  Take 1 mg by mouth See admin instructions. Take 1 tablet (1 mg) by mouth every morning, may take an additional tablet later in the day as needed for anxiety     fluconazole 150 MG tablet  Commonly known as:  DIFLUCAN  Take 1 tablet (150 mg total) by mouth daily.     guaiFENesin 600 MG 12 hr tablet  Commonly known as:  MUCINEX  Take 1 tablet (600 mg total) by mouth 2 (two) times daily.     levofloxacin 750 MG tablet  Commonly known as:  LEVAQUIN  Take 1 tablet (750 mg total) by mouth daily.     Melatonin 5 MG Tabs  Take 5 mg by mouth at bedtime.     metFORMIN 500 MG 24 hr tablet  Commonly known as:  GLUCOPHAGE-XR  Take 500-1,000 mg by mouth 2 (two)  times daily. Take 1 tablet (500 mg) by mouth daily with breakfast, take 2 tablets (1000 mg) with supper     multivitamin with minerals Tabs tablet  Take 1 tablet by mouth daily.     nitroGLYCERIN 0.4 MG SL tablet  Commonly known as:  NITROSTAT  Place 1 tablet (0.4 mg total) under the tongue every 5 (five) minutes as needed for chest pain.     OXYGEN  Inhale into the lungs continuous. 2 1/2 - 3 L     predniSONE 10 MG tablet  Commonly known as:  DELTASONE  Take 4 tabs  daily with food x 4 days, then 3 tabs daily x 4 days, then 2 tabs daily x 4 days, then 1 tab daily x4 days then stop. #40     Red Yeast Rice 600 MG Tabs  Take 600 mg by mouth daily.     SPIRIVA HANDIHALER 18 MCG inhalation capsule  Generic drug:  tiotropium  INHALE 1 THE CONTENTS OF 1 CAPSULE DAILY     triamcinolone cream 0.1 %  Commonly known as:  KENALOG  Apply 1 application topically 3 (three) times daily as needed (itching).     vitamin B-12 1000 MCG tablet  Commonly known as:  CYANOCOBALAMIN  Take 1,000 mcg by mouth daily.     vitamin C 1000 MG tablet  Take 1,000 mg by mouth daily.     Vitamin D 2000 units tablet  Take 2,000 Units by mouth daily.         Discharged Condition: fair  Physician Statement:   The Patient was personally examined, the discharge assessment and plan has been personally reviewed and I agree with ACNP Babcock's assessment and plan. > 30 minutes of time have been dedicated to discharge assessment, planning and discharge instructions.    Attending note: I have seen and examined the patient with nurse practitioner/resident and agree with the note. History, labs and imaging reviewed.  75 year old with COPD exacerbation, HCAP -Pseudomonas, RSV. Breathing symptoms continues to improve. She feels close to baseline. She'll be discharged on her home inhalers, prednisone taper.  Marshell Garfinkel MD [facility] Pager [phone] If no answer or after 3pm call:  [phone] 10/19/2015, 12:16 PM

## 2015-10-19 NOTE — Discharge Instructions (Signed)
Albuterol NEB 3 times a day for next 5 days First thing in am (before breakfast), before lunch and before dinner  Resume advair After breakfast and dinner and spiriva after breakfast   Rinse Mouth out after advair

## 2015-10-21 ENCOUNTER — Other Ambulatory Visit: Payer: Self-pay | Admitting: *Deleted

## 2015-10-21 NOTE — Patient Outreach (Signed)
Referral received from hospital liaison while member was still inpatient.  Member recently discharged after being admitted for COPD exacerbation and respiratory failure with hypoxia requiring mechanical ventilation.  According to chart, member also has history of hypertension, coronary artery disease and diabetes.  Call placed to member to initiate transition of care.  This care manager introduced self and purpose of call.  Member states that she has been "getting better, getting my strength back."  She still has a cough, but denies it being productive.  She reports taking all of her medications appropriately, including steroids and antibiotics, and using her flutter valve as instructed.  She has a follow up appointment with [facility] next week and states that her husband will be able to take her.  She denies any concerns at this time and is open to having a home visit, which is set for next week.  Encouraged to contact this care manager with any questions.  Valente David, BSN, [facility]  [facility] Care Manager [phone]

## 2015-10-22 ENCOUNTER — Other Ambulatory Visit: Payer: Self-pay

## 2015-10-23 ENCOUNTER — Ambulatory Visit: Payer: Medicare Other

## 2015-10-28 ENCOUNTER — Encounter: Payer: Self-pay | Admitting: Acute Care

## 2015-10-28 ENCOUNTER — Other Ambulatory Visit (INDEPENDENT_AMBULATORY_CARE_PROVIDER_SITE_OTHER): Payer: Medicare Other

## 2015-10-28 ENCOUNTER — Telehealth: Payer: Self-pay | Admitting: Internal Medicine

## 2015-10-28 ENCOUNTER — Ambulatory Visit (INDEPENDENT_AMBULATORY_CARE_PROVIDER_SITE_OTHER): Payer: Medicare Other | Admitting: Acute Care

## 2015-10-28 VITALS — BP 124/62 | HR 106 | Temp 98.0°F | Ht 61.0 in | Wt 121.0 lb

## 2015-10-28 DIAGNOSIS — J9621 Acute and chronic respiratory failure with hypoxia: Secondary | ICD-10-CM

## 2015-10-28 DIAGNOSIS — J449 Chronic obstructive pulmonary disease, unspecified: Secondary | ICD-10-CM

## 2015-10-28 DIAGNOSIS — J9622 Acute and chronic respiratory failure with hypercapnia: Secondary | ICD-10-CM | POA: Diagnosis not present

## 2015-10-28 LAB — CBC WITH DIFFERENTIAL/PLATELET
BASOS ABS: 0.5 10*3/uL — AB (ref 0.0–0.1)
Basophils Relative: 3.9 % — ABNORMAL HIGH (ref 0.0–3.0)
EOS PCT: 0.1 % (ref 0.0–5.0)
Eosinophils Absolute: 0 10*3/uL (ref 0.0–0.7)
HEMATOCRIT: 38.8 % (ref 36.0–46.0)
Hemoglobin: 12.6 g/dL (ref 12.0–15.0)
LYMPHS PCT: 3.6 % — AB (ref 12.0–46.0)
Lymphs Abs: 0.4 10*3/uL — ABNORMAL LOW (ref 0.7–4.0)
MCHC: 32.5 g/dL (ref 30.0–36.0)
MCV: 99.6 fl (ref 78.0–100.0)
MONOS PCT: 1.7 % — AB (ref 3.0–12.0)
Monocytes Absolute: 0.2 10*3/uL (ref 0.1–1.0)
NEUTROS ABS: 11.2 10*3/uL — AB (ref 1.4–7.7)
Neutrophils Relative %: 90.7 % — ABNORMAL HIGH (ref 43.0–77.0)
PLATELETS: 253 10*3/uL (ref 150.0–400.0)
RBC: 3.89 Mil/uL (ref 3.87–5.11)
RDW: 14.2 % (ref 11.5–15.5)
WBC: 12.3 10*3/uL — ABNORMAL HIGH (ref 4.0–10.5)

## 2015-10-28 NOTE — Assessment & Plan Note (Signed)
Slowly returning to baseline after hospitalization.  Plan:  Continue your Spiriva and Advair. These are your maintenance medications for your COPD. Use the Albuterol nebulizer as needed every 6 hours for wheezing. Finish your prednisone taper. Continue your oxygen at 2.5 - 3 liters, titrate up with activity as needed. Follow up with Dr. Annamaria Boots in 4 weeks.

## 2015-10-28 NOTE — Telephone Encounter (Signed)
Please advise [name] where pt can be worked in at? thanks

## 2015-10-28 NOTE — Patient Instructions (Addendum)
You look great today. Continue your Spiriva and Advair. These are your maintenance medications for your COPD. Use the Albuterol nebulizer as needed every 6 hours for wheezing. Use your flutter valve as needed. Finish your prednisone taper. Continue your oxygen at 2.5 - 3 liters, titrate up with activity as needed. Continue your incentive spirometer as you have been doing. Follow up with Dr. Annamaria Boots in 4 weeks. CBC with diff today. We will call you the results. Please contact office for sooner follow up if symptoms do not improve or worsen or seek emergency care.

## 2015-10-28 NOTE — Assessment & Plan Note (Addendum)
Resolving with some residual deconditioning. Antibiotic treatment completed. Finishing prednisone taper Last CXR in the hospital showed resolution of pneumonia. Plan:  CBC with diff today shows WBC trending down, 12.3 K/uL. Results have been called to the patient. We discussed being proactive in seeking care at the office at the first signs of respiratory disress, versus waiting until she is very sick. Use  flutter valve as needed. Finish your prednisone taper. Continue your oxygen at 2.5 - 3 liters, titrate up with activity as needed. Continue your incentive spirometer as you have been doing. Follow up with Dr. Annamaria Boots in 4 weeks. Please contact office for sooner follow up if symptoms do not improve or worsen or seek emergency care

## 2015-10-28 NOTE — Telephone Encounter (Signed)
lmtcb for pt.  

## 2015-10-28 NOTE — Telephone Encounter (Signed)
Pt can be seen 11-25-15 at 11:15am slot. Thanks.

## 2015-10-28 NOTE — Progress Notes (Signed)
Subjective:    Patient ID: [name], female    DOB: 1941-06-24, 75 y.o.   MRN: [id number]  HPI 75 y.o. female w/ PMHx of COPD on home O2 (2L), CAD s/p stent x2, HTN, HLD, DM type II, admitted for acute respiratory failure 10/12/15, 2/2 COPD exacerbation / viral pneumonitis.  Significant Events:  Admit date: 10/12/2015 Discharge date: 10/19/2015  Discharge Diagnoses:  Acute on Chronic Hyperbcarbic Respiratory Failure ACOPD Exacerbation.  Viral Pneumonitis  Pseudomonas Pneumonia  DM Type II H/O CAD H/O HTN:  Acute Encephalopathy H/O Anxiety Dysuria   Consults  STUDIES:  CXR 1/9 >> Left base atelectasis  CULTURES: Blood x 2 01/09 >>neg  Urine 01/09: Negative Sputum 01/09: Pseudomonas Resp Virus Panel 01/09: RSV A  NTIBIOTICS: Levaquin 1/10 >>  Rocephin 1/9  Azithro 1/9   SIGNIFICANT EVENTS: 01/05: SOB 01/08: Worsening SOB and AMS > ED 01/09: No improvement on BiPAP > Intubated > ICU  LINES/TUBES: PIV X2 OETT 1/9 - 1/11 (self-extubated)   Recent Labs Lab 10/28/15 1449  HGB 12.6  HCT 38.8  WBC 12.3*  PLT 253.0   10/28/15: Hospital follow-up visit: Patient continues to slowly improve. She was discharged home on home oxygen which she is using at 2-1/2-3 L. She denies fever chills or sweats. She states she is feeling better but is not yet at her baseline. Antibiotics and Diflucan treatment are complete. She is continuing with her prednisone taper .She is using her Spiriva and Advair as her COPD maintenance as prescribed. She is using her albuterol nebulizer treatments about 4 times daily. She continues to use her incentive spirometer and flutter valve. We discussed being proactive in seeking help from the office as soon as possible for any changes in  respiratory status. We discussed the importance of prompt reaction to  unusual sleepiness and altered mental status in patient's with COPD with family. She continues to have a productive cough with  creamy-looking secretions. Patient states that this is improving.CBC checked today continues to trend down.  Past Medical History  Diagnosis Date  . CAD (coronary artery disease)     Stent RCA 2005, stent LAD 2006  . Hypertension   . Hyperlipemia   . COPD (chronic obstructive pulmonary disease) ([place])   . Tobacco abuse   . Fatigue   . Shortness of breath   . Myocardial infarction ([place])   . Heart murmur   . Recurrent upper respiratory infection (URI)   . Depression   . Carotid artery occlusion   . Diabetes mellitus without complication ([place])     Current outpatient prescriptions:  .  ADVAIR DISKUS 250-50 MCG/DOSE AEPB, INHALE 1 PUFF EVERY 12HRS, Disp: 60 each, Rfl: 1 .  albuterol (PROVENTIL) (2.5 MG/3ML) 0.083% nebulizer solution, USE 1 VIAL IN NEBULIZER EVERY 6HRS FOR WHEEZING, Disp: 75 mL, Rfl: 1 .  Albuterol Sulfate (PROAIR RESPICLICK) 123XX123 (90 BASE) MCG/ACT AEPB, Inhale 2 puffs into the lungs every 6 (six) hours as needed. (Patient taking differently: Inhale 2 puffs into the lungs every 6 (six) hours as needed (shortness of breath/ wheezing). ), Disp: 1 each, Rfl: 11 .  amLODipine (NORVASC) 5 MG tablet, Take 1 tablet (5 mg total) by mouth daily., Disp: 30 tablet, Rfl: 6 .  Ascorbic Acid (VITAMIN C) 1000 MG tablet, Take 1,000 mg by mouth daily., Disp: , Rfl:  .  aspirin EC 81 MG tablet, Take 81 mg by mouth at bedtime. , Disp: , Rfl:  .  Biotin (BIOTIN MAXIMUM STRENGTH) 10  MG TABS, Take 10 mg by mouth daily. 10,000 mcg, Disp: , Rfl:  .  Cholecalciferol (VITAMIN D) 2000 units tablet, Take 2,000 Units by mouth daily., Disp: , Rfl:  .  CINNAMON PO, Take 1,000 mg by mouth 2 (two) times daily. , Disp: , Rfl:  .  clonazePAM (KLONOPIN) 1 MG tablet, Take 1 mg by mouth at bedtime. Take 1 tablet (1 mg) by mouth every morning, may take an additional tablet later in the day as needed for anxiety, Disp: , Rfl:  .  guaiFENesin (MUCINEX) 600 MG 12 hr tablet, Take 1 tablet (600 mg total) by mouth 2  (two) times daily., Disp: , Rfl:  .  Melatonin 5 MG TABS, Take 5 mg by mouth at bedtime., Disp: , Rfl:  .  metFORMIN (GLUCOPHAGE-XR) 500 MG 24 hr tablet, Take 1 tablet (500 mg) by mouth daily with breakfast, take 2 tablets (1000 mg) with supper, Disp: , Rfl: 3 .  Multiple Vitamin (MULTIVITAMIN WITH MINERALS) TABS tablet, Take 1 tablet by mouth daily., Disp: , Rfl:  .  nitroGLYCERIN (NITROSTAT) 0.4 MG SL tablet, Place 1 tablet (0.4 mg total) under the tongue every 5 (five) minutes as needed for chest pain., Disp: 25 tablet, Rfl: 6 .  OXYGEN, Inhale into the lungs continuous. 2 1/2 - 3 L, Disp: , Rfl:  .  predniSONE (DELTASONE) 10 MG tablet, Take 4 tabs  daily with food x 4 days, then 3 tabs daily x 4 days, then 2 tabs daily x 4 days, then 1 tab daily x4 days then stop. #40, Disp: 40 tablet, Rfl: 0 .  Red Yeast Rice 600 MG TABS, Take 600 mg by mouth daily., Disp: , Rfl:  .  SPIRIVA HANDIHALER 18 MCG inhalation capsule, INHALE 1 THE CONTENTS OF 1 CAPSULE DAILY, Disp: 30 capsule, Rfl: 8 .  vitamin B-12 (CYANOCOBALAMIN) 1000 MCG tablet, Take 1,000 mcg by mouth daily., Disp: , Rfl:    Allergies  Allergen Reactions  . Bactrim [Sulfamethoxazole-Trimethoprim] Nausea And Vomiting  . Codeine Other (See Comments)    Unknown reaction but cannot take per husband  . Vytorin [Ezetimibe-Simvastatin] Other (See Comments)    Reaction: possibly malaise per husband    Review of Systems Constitutional:   No  weight loss, night sweats,  Fevers, chills, fatigue, or  lassitude.  HEENT:   No headaches,  Difficulty swallowing,  Tooth/dental problems, or  Sore throat,                No sneezing, itching, ear ache, nasal congestion, post nasal drip,   CV:  No chest pain,  Orthopnea, PND, swelling in lower extremities, anasarca, dizziness, palpitations, syncope.   GI  No heartburn, indigestion, abdominal pain, nausea, vomiting, diarrhea, change in bowel habits, loss of appetite, bloody stools.   Resp: + shortness of  breath with exertion or at rest, but improving.  No excess mucus, + productive cough,  No non-productive cough,  No coughing up of blood.  No change in color of mucus.  No wheezing.  No chest wall deformity  Skin: no rash or lesions.  GU: no dysuria, change in color of urine, no urgency or frequency.  No flank pain, no hematuria   MS:  No joint pain or swelling.  No decreased range of motion.  No back pain.  Psych:  No change in mood or affect. No depression or anxiety.  No memory loss.         Objective:   Physical Exam  BP 124/62 mmHg  Pulse 106  Temp(Src) 98 F (36.7 C) (Oral)  Ht 5\' 1"  (1.549 m)  Wt 121 lb (54.885 kg)  BMI 22.87 kg/m2  SpO2 93%  Physical Exam:  General- No distress,  A&Ox3, frail ,elderly, pleasant female. ENT: No sinus tenderness, TM clear, pale nasal mucosa, no oral exudate,no post nasal drip, no LAN Cardiac: S1, S2, regular rate and rhythm, no murmur Chest: No wheeze/ rales/ dullness per bases bilaterally.; no accessory muscle use, no nasal flaring, no sternal retractions Abd.: Soft Non-tender Ext: No edema Neuro:  Deconditioned from recent hospitalization. Skin: No rashes, warm and dry Psych: normal mood and behavior       Assessment & Plan:

## 2015-10-29 ENCOUNTER — Encounter: Payer: Self-pay | Admitting: *Deleted

## 2015-10-29 ENCOUNTER — Other Ambulatory Visit: Payer: Self-pay | Admitting: *Deleted

## 2015-10-29 VITALS — BP 136/64 | HR 103 | Resp 20 | Ht 61.0 in | Wt 121.0 lb

## 2015-10-29 DIAGNOSIS — E1169 Type 2 diabetes mellitus with other specified complication: Secondary | ICD-10-CM

## 2015-10-29 DIAGNOSIS — J441 Chronic obstructive pulmonary disease with (acute) exacerbation: Secondary | ICD-10-CM

## 2015-10-29 NOTE — Patient Outreach (Signed)
[facility]) Care Management   10/29/2015  DALEEN STEINHAUS 1941/10/01 [id number]  [name] is an 75 y.o. female  Subjective:   Member states that she is feeling better, but has started to have diarrhea today.  She reports that her last bowel movement was Sunday.  She continues to have shortness of breath, especially with exertion, even while on the home oxygen.  Objective:   Review of Systems  Constitutional: Negative.   HENT: Negative.   Eyes: Negative.   Respiratory: Positive for shortness of breath.   Cardiovascular: Negative.   Gastrointestinal: Positive for diarrhea.  Genitourinary: Negative.   Musculoskeletal: Negative.   Skin: Negative.   Neurological: Negative.   Endo/Heme/Allergies: Negative.     Physical Exam  Constitutional: She appears well-developed.  Neck: Normal range of motion.  Cardiovascular: Normal rate, regular rhythm and normal heart sounds.   Respiratory: Breath sounds normal.  GI: Soft. Bowel sounds are normal.  Skin: Skin is warm and dry.    BP 136/64 mmHg  Pulse 103  Resp 20  Ht 1.549 m ($Remove'5\' 1"'jhEfHxv$ )  Wt 121 lb (54.885 kg)  BMI 22.87 kg/m2  SpO2 96%   Current Medications:   Current Outpatient Prescriptions  Medication Sig Dispense Refill  . ADVAIR DISKUS 250-50 MCG/DOSE AEPB INHALE 1 PUFF EVERY 12HRS 60 each 1  . albuterol (PROVENTIL) (2.5 MG/3ML) 0.083% nebulizer solution USE 1 VIAL IN NEBULIZER EVERY 6HRS FOR WHEEZING 75 mL 1  . Albuterol Sulfate (PROAIR RESPICLICK) 865 (90 BASE) MCG/ACT AEPB Inhale 2 puffs into the lungs every 6 (six) hours as needed. (Patient taking differently: Inhale 2 puffs into the lungs every 6 (six) hours as needed (shortness of breath/ wheezing). ) 1 each 11  . amLODipine (NORVASC) 5 MG tablet Take 1 tablet (5 mg total) by mouth daily. 30 tablet 6  . Ascorbic Acid (VITAMIN C) 1000 MG tablet Take 1,000 mg by mouth daily.    Marland Kitchen aspirin EC 81 MG tablet Take 81 mg by mouth at bedtime.     . Biotin  (BIOTIN MAXIMUM STRENGTH) 10 MG TABS Take 10 mg by mouth daily. 10,000 mcg    . Cholecalciferol (VITAMIN D) 2000 units tablet Take 2,000 Units by mouth daily.    Marland Kitchen CINNAMON PO Take 1,000 mg by mouth 2 (two) times daily.     . clonazePAM (KLONOPIN) 1 MG tablet Take 1 mg by mouth at bedtime. Take 1 tablet (1 mg) by mouth every morning, may take an additional tablet later in the day as needed for anxiety    . guaiFENesin (MUCINEX) 600 MG 12 hr tablet Take 1 tablet (600 mg total) by mouth 2 (two) times daily.    . Melatonin 5 MG TABS Take 5 mg by mouth at bedtime.    . metFORMIN (GLUCOPHAGE-XR) 500 MG 24 hr tablet Take 1 tablet (500 mg) by mouth daily with breakfast, take 2 tablets (1000 mg) with supper  3  . Multiple Vitamin (MULTIVITAMIN WITH MINERALS) TABS tablet Take 1 tablet by mouth daily.    . nitroGLYCERIN (NITROSTAT) 0.4 MG SL tablet Place 1 tablet (0.4 mg total) under the tongue every 5 (five) minutes as needed for chest pain. 25 tablet 6  . OXYGEN Inhale into the lungs continuous. 2 1/2 - 3 L    . predniSONE (DELTASONE) 10 MG tablet Take 4 tabs  daily with food x 4 days, then 3 tabs daily x 4 days, then 2 tabs daily x 4 days, then  1 tab daily x4 days then stop. #40 40 tablet 0  . Red Yeast Rice 600 MG TABS Take 600 mg by mouth daily.    Marland Kitchen SPIRIVA HANDIHALER 18 MCG inhalation capsule INHALE 1 THE CONTENTS OF 1 CAPSULE DAILY 30 capsule 8  . vitamin B-12 (CYANOCOBALAMIN) 1000 MCG tablet Take 1,000 mcg by mouth daily.     No current facility-administered medications for this visit.    Functional Status:   In your present state of health, do you have any difficulty performing the following activities: 10/29/2015 10/15/2015  Hearing? N N  Vision? Y N  Difficulty concentrating or making decisions? N N  Walking or climbing stairs? Y N  Dressing or bathing? N N  Doing errands, shopping? Y N  Preparing Food and eating ? Y -  Using the Toilet? N -  In the past six months, have you accidently  leaked urine? Y -  Do you have problems with loss of bowel control? N -  Managing your Medications? N -  Managing your Finances? N -  Housekeeping or managing your Housekeeping? Y -    Fall/Depression Screening:    PHQ 2/9 Scores 10/29/2015 08/01/2012  PHQ - 2 Score 0 6    Assessment:    Initial home visit complete, daughter present during visit.  Member continues to improve with her breathing, using her flutter valve daily.  She states that she has had 4 episodes of diarrhea this morning.  Daughter provides imodium and ginger ale to assist with decreasing stools.  Member and daughter instructed to contact PCP office or PA at the pulmonary office if her diarrhea is not improved by tomorrow morning.  They both verbalize understanding.  Member states that she is taking all of her medications as prescribed, denies needing her rescue inhaler rescue inhaler since discharge.  She is unsure if she has medication coverage with her insurance and is requesting review of her medications, hoping to find alternative ways to get medications to reduce cost. She denies having any difficulties with her oxygen, which is provided by [place].  She does state that she uses furniture to balance herself while walking and has found it difficult to go outside of the home without assistance.  She is requesting a rolling walker with the seat.  Will contact the PA to request this.  Both member and daughter deny any other concerns at this time.  Member provided with [facility] calendar tool, COPD action plan reviewed.  Encouraged to contact this care manager with any questions.  Contact information provided.  Plan:   Will consult pharmacy for medication review. Will continue with weekly transition of care calls next week.  THN CM Care Plan Problem One        Most Recent Value   Care Plan Problem One  Recent hospitalization for COPD   Role Documenting the Problem One  Care Management Coordinator   Care Plan for  Problem One  Active   THN Long Term Goal (31-90 days)  Member will not be readmitted to hospital with COPD related condition within the next 31 days   THN Long Term Goal Start Date  10/21/15   Interventions for Problem One Long Term Goal  Discussed with member the importance of following discharge instructions, including follow up appointments, medications, diet, and home health involvement, to decrease the risk of readmission   THN CM Short Term Goal #1 (0-30 days)  Member will take all medications as instructed over the next  4 weeks   THN CM Short Term Goal #1 Start Date  10/21/15   Interventions for Short Term Goal #1  Discussed with member the importance of following discharge instructions, including follow up appointments, medications, diet, and home health involvement, to decrease the risk of readmission   THN CM Short Term Goal #2 (0-30 days)  Member will have follow up appointment with primary care provider within the next 4 weeks   THN CM Short Term Goal #2 Start Date  10/21/15   [facility] CM Short Term Goal #2 Met Date  10/29/15   Interventions for Short Term Goal #2  Discussed with member the importance of following discharge instructions, including follow up appointments, medications, diet, and home health involvement, to decrease the risk of readmission     Valente David, BSN, [place] Manager [phone]

## 2015-10-30 NOTE — Telephone Encounter (Signed)
Pt scheduled with CY 11/25/15 at 11:15. Pt aware to arrive about 15 mins early. Nothing further needed.

## 2015-11-03 ENCOUNTER — Encounter: Payer: Self-pay | Admitting: *Deleted

## 2015-11-05 ENCOUNTER — Telehealth: Payer: Self-pay | Admitting: Acute Care

## 2015-11-05 NOTE — Telephone Encounter (Signed)
Patient says that she finished her prednisone and she is currently taking blood pressure medicine and she wants to know if she can stop the blood pressure medication.  Patient says that she has been having nosebleeds, but her blood pressure has been doing fine.  She said that she was not on blood pressure medication when she was in the hospital but was sent home on blood pressure medication.   Allergies  Allergen Reactions  . Bactrim [Sulfamethoxazole-Trimethoprim] Nausea And Vomiting  . Codeine Other (See Comments)    Unknown reaction but cannot take per husband  . Vytorin [Ezetimibe-Simvastatin] Other (See Comments)    Reaction: possibly malaise per husband   Current Outpatient Prescriptions on File Prior to Visit  Medication Sig Dispense Refill  . ADVAIR DISKUS 250-50 MCG/DOSE AEPB INHALE 1 PUFF EVERY 12HRS 60 each 1  . albuterol (PROVENTIL) (2.5 MG/3ML) 0.083% nebulizer solution USE 1 VIAL IN NEBULIZER EVERY 6HRS FOR WHEEZING 75 mL 1  . Albuterol Sulfate (PROAIR RESPICLICK) 123XX123 (90 BASE) MCG/ACT AEPB Inhale 2 puffs into the lungs every 6 (six) hours as needed. (Patient taking differently: Inhale 2 puffs into the lungs every 6 (six) hours as needed (shortness of breath/ wheezing). ) 1 each 11  . amLODipine (NORVASC) 5 MG tablet Take 1 tablet (5 mg total) by mouth daily. 30 tablet 6  . Ascorbic Acid (VITAMIN C) 1000 MG tablet Take 1,000 mg by mouth daily.    Marland Kitchen aspirin EC 81 MG tablet Take 81 mg by mouth at bedtime.     . Biotin (BIOTIN MAXIMUM STRENGTH) 10 MG TABS Take 10 mg by mouth daily. 10,000 mcg    . Cholecalciferol (VITAMIN D) 2000 units tablet Take 2,000 Units by mouth daily.    Marland Kitchen CINNAMON PO Take 1,000 mg by mouth 2 (two) times daily.     . clonazePAM (KLONOPIN) 1 MG tablet Take 1 mg by mouth at bedtime. Take 1 tablet (1 mg) by mouth every morning, may take an additional tablet later in the day as needed for anxiety    . guaiFENesin (MUCINEX) 600 MG 12 hr tablet Take 1 tablet (600 mg  total) by mouth 2 (two) times daily.    . Melatonin 5 MG TABS Take 5 mg by mouth at bedtime.    . metFORMIN (GLUCOPHAGE-XR) 500 MG 24 hr tablet Take 1 tablet (500 mg) by mouth daily with breakfast, take 2 tablets (1000 mg) with supper  3  . Multiple Vitamin (MULTIVITAMIN WITH MINERALS) TABS tablet Take 1 tablet by mouth daily.    . nitroGLYCERIN (NITROSTAT) 0.4 MG SL tablet Place 1 tablet (0.4 mg total) under the tongue every 5 (five) minutes as needed for chest pain. 25 tablet 6  . OXYGEN Inhale into the lungs continuous. 2 1/2 - 3 L    . predniSONE (DELTASONE) 10 MG tablet Take 4 tabs  daily with food x 4 days, then 3 tabs daily x 4 days, then 2 tabs daily x 4 days, then 1 tab daily x4 days then stop. #40 40 tablet 0  . Red Yeast Rice 600 MG TABS Take 600 mg by mouth daily.    Marland Kitchen SPIRIVA HANDIHALER 18 MCG inhalation capsule INHALE 1 THE CONTENTS OF 1 CAPSULE DAILY 30 capsule 8  . vitamin B-12 (CYANOCOBALAMIN) 1000 MCG tablet Take 1,000 mcg by mouth daily.     No current facility-administered medications on file prior to visit.   Patient Instructions     You look great today. Continue your  Spiriva and Advair. These are your maintenance medications for your COPD. Use the Albuterol nebulizer as needed every 6 hours for wheezing. Use your flutter valve as needed. Finish your prednisone taper. Continue your oxygen at 2.5 - 3 liters, titrate up with activity as needed. Continue your incentive spirometer as you have been doing. Follow up with Dr. Annamaria Boots in 4 weeks. CBC with diff today. We will call you the results. Please contact office for sooner follow up if symptoms do not improve or worsen or seek emergency care.

## 2015-11-05 NOTE — Telephone Encounter (Signed)
lmtcb for pt.  

## 2015-11-05 NOTE — Telephone Encounter (Signed)
[phone], pt cb

## 2015-11-06 ENCOUNTER — Other Ambulatory Visit: Payer: Self-pay | Admitting: *Deleted

## 2015-11-06 NOTE — Telephone Encounter (Signed)
Called made pt aware of sarah's recs below. She verbalized understanding and needed nothing further

## 2015-11-06 NOTE — Patient Outreach (Signed)
Weekly transition of care call placed to member.  She state that she is doing well, denies any complaints at this time.  She reports that she has completed her course of prednisone and denies any diarrhea as she was experiencing last week.  States her cough have decreased, and she continues to use her flutter valve.  She inquires about an order for a rolling walker, call placed to member's PCP office, however, office is closed for lunch.  Will call back to request an order for DME as member states that she continues to need another person to stabilize herself when outside of the home (in the home she uses furniture).  Will provide follow up call to member next week.  Encouraged to contact this care manager with any other concerns.  Valente David, BSN, [place] Management  [facility] Care Manager [phone]

## 2015-11-06 NOTE — Telephone Encounter (Signed)
Please call the patient and ask her to follow-up with her primary care physician Dr. Bryon Lions regarding blood pressure management. The medication was started in the hospital during her critical illness . She has a documented history of hypertension and medical records prior to the Hospital admission, although I do not see that it was being treated with medication.. Blood pressure management needs to be coordinated by her primary care physician.Thanks

## 2015-11-07 DIAGNOSIS — J449 Chronic obstructive pulmonary disease, unspecified: Secondary | ICD-10-CM | POA: Diagnosis not present

## 2015-11-09 ENCOUNTER — Other Ambulatory Visit: Payer: Self-pay | Admitting: *Deleted

## 2015-11-09 NOTE — Patient Outreach (Signed)
Call placed to PCP office per [facility] request.  Message left for  Thayer Headings stating that member is requesting a rolling walker with the seat for mobility.  Contact information for this care manager and member provided on voice message.  Will await call back.  Valente David, BSN, [place] Management  [facility] Care Manager [phone]

## 2015-11-11 ENCOUNTER — Telehealth: Payer: Self-pay | Admitting: Internal Medicine

## 2015-11-11 NOTE — Telephone Encounter (Signed)
Spoke with the pt  She states that she is not improving since her last visit here on 1.25.17 She is still having sore throat and cough with minimal green sputum  She reports no SOB, wheezing, CP/Chest tightness, f/c/s  Please advise, thanks! Allergies  Allergen Reactions  . Bactrim [Sulfamethoxazole-Trimethoprim] Nausea And Vomiting  . Codeine Other (See Comments)    Unknown reaction but cannot take per husband  . Vytorin [Ezetimibe-Simvastatin] Other (See Comments)    Reaction: possibly malaise per husband   Current Outpatient Prescriptions on File Prior to Visit  Medication Sig Dispense Refill  . ADVAIR DISKUS 250-50 MCG/DOSE AEPB INHALE 1 PUFF EVERY 12HRS 60 each 1  . albuterol (PROVENTIL) (2.5 MG/3ML) 0.083% nebulizer solution USE 1 VIAL IN NEBULIZER EVERY 6HRS FOR WHEEZING 75 mL 1  . Albuterol Sulfate (PROAIR RESPICLICK) 123XX123 (90 BASE) MCG/ACT AEPB Inhale 2 puffs into the lungs every 6 (six) hours as needed. (Patient taking differently: Inhale 2 puffs into the lungs every 6 (six) hours as needed (shortness of breath/ wheezing). ) 1 each 11  . amLODipine (NORVASC) 5 MG tablet Take 1 tablet (5 mg total) by mouth daily. 30 tablet 6  . Ascorbic Acid (VITAMIN C) 1000 MG tablet Take 1,000 mg by mouth daily.    Marland Kitchen aspirin EC 81 MG tablet Take 81 mg by mouth at bedtime.     . Biotin (BIOTIN MAXIMUM STRENGTH) 10 MG TABS Take 10 mg by mouth daily. 10,000 mcg    . Cholecalciferol (VITAMIN D) 2000 units tablet Take 2,000 Units by mouth daily.    Marland Kitchen CINNAMON PO Take 1,000 mg by mouth 2 (two) times daily.     . clonazePAM (KLONOPIN) 1 MG tablet Take 1 mg by mouth at bedtime. Take 1 tablet (1 mg) by mouth every morning, may take an additional tablet later in the day as needed for anxiety    . guaiFENesin (MUCINEX) 600 MG 12 hr tablet Take 1 tablet (600 mg total) by mouth 2 (two) times daily.    . Melatonin 5 MG TABS Take 5 mg by mouth at bedtime.    . metFORMIN (GLUCOPHAGE-XR) 500 MG 24 hr tablet Take  1 tablet (500 mg) by mouth daily with breakfast, take 2 tablets (1000 mg) with supper  3  . Multiple Vitamin (MULTIVITAMIN WITH MINERALS) TABS tablet Take 1 tablet by mouth daily.    . nitroGLYCERIN (NITROSTAT) 0.4 MG SL tablet Place 1 tablet (0.4 mg total) under the tongue every 5 (five) minutes as needed for chest pain. 25 tablet 6  . OXYGEN Inhale into the lungs continuous. 2 1/2 - 3 L    . predniSONE (DELTASONE) 10 MG tablet Take 4 tabs  daily with food x 4 days, then 3 tabs daily x 4 days, then 2 tabs daily x 4 days, then 1 tab daily x4 days then stop. #40 40 tablet 0  . Red Yeast Rice 600 MG TABS Take 600 mg by mouth daily.    Marland Kitchen SPIRIVA HANDIHALER 18 MCG inhalation capsule INHALE 1 THE CONTENTS OF 1 CAPSULE DAILY 30 capsule 8  . vitamin B-12 (CYANOCOBALAMIN) 1000 MCG tablet Take 1,000 mcg by mouth daily.     No current facility-administered medications on file prior to visit.

## 2015-11-11 NOTE — Telephone Encounter (Signed)
Pt's daughter came into the office to pick up samples. I reviewed and instructed her on how to use the Bevespi inhaler. She had no further question. Nothing further needed. Will sign off on message.

## 2015-11-11 NOTE — Telephone Encounter (Signed)
Spoke with the pt and notified of recs per CDY  She verbalized understanding  Daughter to pick up sample and ask for nurse to show her technique

## 2015-11-11 NOTE — Telephone Encounter (Signed)
I would like to see if the Advair inhaler is contributing to her sore throat and cough  Can we have her husband come by and pick up a sample Bevespi inhaler to use instead-  2 puffs, twice daily

## 2015-11-12 ENCOUNTER — Other Ambulatory Visit: Payer: Self-pay | Admitting: *Deleted

## 2015-11-12 NOTE — Patient Outreach (Addendum)
Weekly transition of care call placed to member.  She states that she is feeling better, "I think I'm doing good."  She reports that she was still having some coughing and sputum production, but contacted Dr. Janee Morn office yesterday and received a new inhaler.  She states that she has been able to do more housework than usual today and "feel pretty good."   This care manager inquired if she had receive a call from her PCP office regarding the request for a walker, she denies.  Informed member that another call will be placed to request walker.  She denies any further concerns at this time.  Will follow up next week.  Call placed to PCP office, message left for Darleen Crocker to contact either the member or this care manager about a request for a rolling walker (rollator).  Will await call back.  Valente David, BSN, [place] Management  [facility] Care Manager [phone]

## 2015-11-16 ENCOUNTER — Telehealth: Payer: Self-pay | Admitting: Cardiovascular Disease

## 2015-11-16 NOTE — Telephone Encounter (Signed)
Spoke with pt. She is asking if she needs carotid doppler study done.  I told her it was done in July 2016 with one year follow up planned.  Will plan to schedule at next office visit.

## 2015-11-16 NOTE — Telephone Encounter (Signed)
New message   Pt wants rn to call about adding an order for Vascular

## 2015-11-18 ENCOUNTER — Other Ambulatory Visit: Payer: Self-pay | Admitting: Pharmacist

## 2015-11-18 ENCOUNTER — Telehealth: Payer: Self-pay | Admitting: Internal Medicine

## 2015-11-18 DIAGNOSIS — R269 Unspecified abnormalities of gait and mobility: Secondary | ICD-10-CM | POA: Diagnosis not present

## 2015-11-18 DIAGNOSIS — J449 Chronic obstructive pulmonary disease, unspecified: Secondary | ICD-10-CM | POA: Diagnosis not present

## 2015-11-18 NOTE — Telephone Encounter (Signed)
Spoke with [name]. She is aware of CY's recommendation. She asks that we call the pt and let her know. Pt is aware to keep taking Advair and Spiriva. Nothing further was needed.

## 2015-11-18 NOTE — Telephone Encounter (Signed)
Spoke with Benjamine Mola with [facility]. States Bevespi and Spiriva interact with each other. Also Bevespi is not on pt's formulary. Benjamine Mola is recommending that if CY wants an alterative to Advair to change her to Symbicort.  CY - please advise. Thanks.

## 2015-11-18 NOTE — Telephone Encounter (Signed)
I think her last instruction was from NP to continue Advair and Spiriva. I would have her continue that. Not sure when she got Bevespi but it would have been to use instead of Spiriva anyway, so just drop it.

## 2015-11-18 NOTE — Patient Outreach (Signed)
Receive a call back from [place] in the patient's Pulmonologist's office, stating that per Dr. Annamaria Boots, patient is to stop the Bevespi and instead just continue on the Advair and Spiriva. Mendel Ryder states that she will also call and follow up with the patient to let her know.  Will close pharmacy episode at this time. Will notify RNCM of episode closure.  Harlow Asa, PharmD Clinical Pharmacist [place] Management [phone]

## 2015-11-18 NOTE — Addendum Note (Signed)
Addended by: Harlow Asa A on: 11/18/2015 05:02 PM   Modules accepted: Orders, Medications

## 2015-11-18 NOTE — Telephone Encounter (Signed)
lmtcb x1 for [employer]

## 2015-11-18 NOTE — Telephone Encounter (Signed)
[name] returned call [phone]

## 2015-11-18 NOTE — Telephone Encounter (Signed)
lmtcb x1 for [place].

## 2015-11-18 NOTE — Patient Outreach (Signed)
[facility]) Care Management  [place]   11/18/2015  [name] [birth year] [id number]  Subjective: [name] is a 75 y.o. female referred to pharmacy for medication review and to discuss medication cost. Reviewed patient's allergy, medication and problem list in order to perform this evaluation.   Called and spoke with [name] who reports that she is wondering if there are cheaper alternatives for some of her current medications. Note that patient has [employer] 1. Patient reports that she [facility] gets [facility]. Let [name] know about the [facility] pharmacy associated with [employer]. Let her know that using this pharmacy would offer cost savings. Patient reports that she is not interested in using mail order at this time given the number of prescription medications that she currently has. Patient also states that she does not believe that she would qualify for any type of assistance based on the income that she and her husband receive. Note that patient's Proair and Spiriva inhalers are both covered tier 3 inhalers through this plan.  Patient reports that her Advair inhaler was recently changed to [facility] by Dr. Annamaria Boots. Patient reports that she does not remember the reason for this change. However, per EPIC, this change was made on 11/11/15. The note in EPIC from Dr. Janee Morn office states that Dr. Annamaria Boots "would like to see if the Advair inhaler is contributing to her sore throat and cough." Patient was started on Bevespi as an alternative to Advair. Note that while Advair is a combination of a long-acting beta2 agonist and a long-acting corticosteroid, Bevespi is a combination of a long-acting beta2-agonist and a long-acting anticholinergic agent. [name] has been on another long-acting anticholinergic agent, Spiriva. Patient states that she was not instructed to stop Spiriva at  the time of starting the [facility] and has been continuing to use it as well. Let [name] know that I will call and follow up with Dr. Annamaria Boots about this duplication today.  Patient reports that she has been using the Bevespi inhaler just one puff twice daily. Have patient review the directions that she was provided by her pulmonologist, which appropriately instruct her to use 2 puffs by mouth twice daily. Patient states that she will start using the inhaler as directed.  Also let [name] know that [name] is a newer inhaler and that it is not listed on the 2017 formulary currently available on the [employer]. However, let her know that the Advair that she had been on was a tier 3 and that, as an alternative to Advair, Symbicort is also listed as a tier 3. Patient states that she currently has the sample of the Bevespi from Dr. Annamaria Boots, but that she will follow up with Dr. Annamaria Boots about these inhalers and cost at their upcoming visit on 11/25/15.  Patient reports that she is no longer taking amlodipine or prednisone. Reports that she was only on the amlodipine while taking the prednisone as the prednisone had caused an increase in her blood pressure. Reports that she has been instructed to stop.  Patient reports that she is taking Vitamin D, rather than Calcium + Vitamin D, because she was instructed to make this change years ago by a different provider that she was seeing at the time. Reports that she had a bone density test performed, was told that her bone density was becoming thin and that she was to stop the Calcium +  Vitamin D that she was already taking at that time and told to start this dose of Vitamin D. Discuss with patient how calcium and Vitamin D work to support bone health. Patient reports that she does not know of why she was told to stop the calcium or any reason that she cannot take it. Reports no history of kidney stones. Let [name] know that I will send a note to her  PCP, about this. Advise patient also to follow up with her PCP about this at their next visit.  Patient reports that she is taking red yeast rice for her cholesterol. Reports that she took statins such as Lipitor and Crestor in the past and that both caused muscle pain. Discuss with the patient how the active ingredient of red yeast rice is itself the same as a statin, lovastatin. Discuss with patient how as a dietary supplement red yeast rice is not as closely regulated. Explain how this can lead to variation in how much active ingredient and what other ingredients might be in the product and how this can make taking this dietary supplement less safe than taking a prescription alternative. Patient verbalizes understanding. Also talk about this risk with dietary supplements in relation to some of the other supplements that the patient is taking, including cinnamon for her blood sugar. Let patient know that the evidence for the use of cinnamon to lower blood sugar is unclear. Let [name] know that I will contact her Cardiologist, Dr. Angelena Form, regarding the red yeast rice and suggest using a prescription statin as an alternative.  Advise patient to follow up with her PCP about her Vitamin B12 to request to have a level taken to determine if she still needs this supplement.  Ask patient about the symptom of dry mouth with use of Spiriva. Patient reports that she does experience some dry mouth. Counsel patient to using chewing gum or artificial saliva products, which can help to increase saliva production. Patient reports that she does currently use the Biotene saliva product sometimes. Spoke with patient about the particular importance of having enough saliva in the event that she needed to use her Nitrostat. Patient verbalizes understanding. Patient states that she has checked her Nitrostat bottle and confirmed that it is not expired.  Patient reports that she takes melatonin and clonazepam at night for  sleep. Patient denies any morning sleepiness or issues with dizziness or gait with use of clonazepam. Reports that she did have a recent fall, but that this was related to her COPD. Counsel patient to still use caution with use of clonazepam.  Let patient know that I will follow up with her once I have heard back from Dr. Annamaria Boots regarding her inhalers. Patient reports that she has no further questions for me at this time. Provide her with my phone number.  Objective:   Current Medications:  Current Outpatient Prescriptions  Medication Sig Dispense Refill  . albuterol (PROVENTIL) (2.5 MG/3ML) 0.083% nebulizer solution USE 1 VIAL IN NEBULIZER EVERY 6HRS FOR WHEEZING 75 mL 1  . Albuterol Sulfate (PROAIR RESPICLICK) 123XX123 (90 BASE) MCG/ACT AEPB Inhale 2 puffs into the lungs every 6 (six) hours as needed. (Patient taking differently: Inhale 2 puffs into the lungs every 6 (six) hours as needed (shortness of breath/ wheezing). ) 1 each 11  . Ascorbic Acid (VITAMIN C) 1000 MG tablet Take 1,000 mg by mouth daily.    Marland Kitchen aspirin EC 81 MG tablet Take 81 mg by mouth at bedtime.     Marland Kitchen  Biotin (BIOTIN MAXIMUM STRENGTH) 10 MG TABS Take 10 mg by mouth daily. 10,000 mcg    . Cholecalciferol (VITAMIN D) 2000 units tablet Take 2,000 Units by mouth daily.    Marland Kitchen CINNAMON PO Take 1,000 mg by mouth 2 (two) times daily.     . clonazePAM (KLONOPIN) 1 MG tablet Take 1 mg by mouth at bedtime. Take 1 tablet (1 mg) by mouth every morning, may take an additional tablet later in the day as needed for anxiety    . Glycopyrrolate-Formoterol (BEVESPI) 9-4.8 MCG/ACT AERO Inhale 2 puffs into the lungs 2 (two) times daily.    Marland Kitchen guaiFENesin (MUCINEX) 600 MG 12 hr tablet Take 1 tablet (600 mg total) by mouth 2 (two) times daily.    . Melatonin 5 MG TABS Take 5 mg by mouth at bedtime.    . metFORMIN (GLUCOPHAGE-XR) 500 MG 24 hr tablet Take 1 tablet (500 mg) by mouth daily with breakfast, take 2 tablets (1000 mg) with supper  3  . Multiple  Vitamin (MULTIVITAMIN WITH MINERALS) TABS tablet Take 1 tablet by mouth daily.    . nitroGLYCERIN (NITROSTAT) 0.4 MG SL tablet Place 1 tablet (0.4 mg total) under the tongue every 5 (five) minutes as needed for chest pain. 25 tablet 6  . OXYGEN Inhale into the lungs continuous. 2 1/2 - 3 L    . Red Yeast Rice 600 MG TABS Take 600 mg by mouth daily.    Marland Kitchen SPIRIVA HANDIHALER 18 MCG inhalation capsule INHALE 1 THE CONTENTS OF 1 CAPSULE DAILY 30 capsule 8  . vitamin B-12 (CYANOCOBALAMIN) 1000 MCG tablet Take 1,000 mcg by mouth daily.     No current facility-administered medications for this visit.    Assessment:  Drugs sorted by system:  Neurologic/Psychologic: clonazepam, melatonin  Cardiovascular: aspirin, Nitrostat, red yeast rice  Pulmonary/Allergy: albuterol nebulizer solution, Proair, Bevespi, Mucinex, Spiriva  Endocrine: cinnamon, metformin  Vitamins/Minerals: Vitamin C, biotin, Vitamin D, multivitamin, Vitamin 123456   Duplications in therapy: Bevespi and Spiriva  Gaps in therapy: Calcium, prescription statin  Medications to avoid in the elderly: clonazepam, patient denies any related falls and counseled about risk  Drug interactions: . Bevespi + Spiriva: Patient on two long-acting anticholinergic inhalers. Therapeutic duplication. Camillo Flaming + Nitrostat: Anticholinergic Agents may decrease the absorption of Nitroglycerin. Specifically, anticholinergic agents may decrease the dissolution of sublingual nitroglycerin tablets, possibly impairing or slowing nitroglycerin absorption. Patient counseled, see above  Plan:  1) Will call patient's pulmonologist regarding the therapeutic duplication between patient's Bevespi and Spiriva long-acting anticholinergic inhalers.  2) Will send a letter within this encounter to patient's PCP, Dr. Baird Cancer, regarding patient's taking just Vitamin D, rather than calcium + Vitamin D for her bone health. 3) Will send a letter within this  encounter to patient's Cardiologist, Dr. Angelena Form, regarding the patient's taking red yeast rice and suggest using a prescription statin as an alternative.  Harlow Asa, PharmD Clinical Pharmacist [place] [facility] [phone]

## 2015-11-18 NOTE — Patient Outreach (Signed)
Called patient's pulmonologist, Dr. Janee Morn, office regarding a therapeutic duplication between patient's new inhaler, Bevespi, and Spiriva.   Note that per EPIC, on 11/11/15 patient was started on Bevespi as an alternative to Advair. Note that while Advair is a combination of a long-acting beta2 agonist and a long acting corticosteroid, Bevespi is a combination of a beta2-agonist and a long-acting anticholinergic agent. [name] has been on another long-acting anticholinergic agent, Spiriva. Patient states that she was not instructed to stop Spiriva at the time of starting the [facility] and has been continuing to use it as well.   Called and left a message about this interaction with Kelli in the office. If have not heard back from [facility] by the end of the day, will follow up again at that time.  Harlow Asa, PharmD Clinical Pharmacist [place] [facility] [phone]

## 2015-11-18 NOTE — Telephone Encounter (Signed)
[phone], [name]

## 2015-11-19 ENCOUNTER — Other Ambulatory Visit: Payer: Self-pay | Admitting: *Deleted

## 2015-11-19 NOTE — Patient Outreach (Signed)
Weekly transition of care call placed to member.  She state that she is "doing pretty good" stating that she has gotten back to "doing what I want."  She has now received her walker and is appreciative of the assistance in obtaining it.  She reports receiving a call from E. Dhalla, the pharmacist, with clarification of her inhalers.  She denies questions regarding administration and further needs.  Offered again to have member involved with telephonic health coach (as discussed during initial home visit) for continued education, but member denies.  She state that she has had COPD for a while and has managed it well (this was her second admission with the diagnosis since admitted several years ago). Member made aware that this care manager will now close her case, but advised that if she need additional assistance in the future to contact the [facility] care management office.  She verbalizes understanding.    Will notify PCP and care management assistant of case closure.  Valente David, BSN, [place] [facility]  [facility] Care Manager [phone]

## 2015-11-24 ENCOUNTER — Encounter: Payer: Self-pay | Admitting: *Deleted

## 2015-11-25 ENCOUNTER — Encounter: Payer: Self-pay | Admitting: Internal Medicine

## 2015-11-25 ENCOUNTER — Ambulatory Visit (INDEPENDENT_AMBULATORY_CARE_PROVIDER_SITE_OTHER): Payer: Medicare Other | Admitting: Internal Medicine

## 2015-11-25 VITALS — BP 122/76 | HR 96 | Ht 61.0 in | Wt 120.4 lb

## 2015-11-25 DIAGNOSIS — J9622 Acute and chronic respiratory failure with hypercapnia: Secondary | ICD-10-CM | POA: Diagnosis not present

## 2015-11-25 DIAGNOSIS — J9621 Acute and chronic respiratory failure with hypoxia: Secondary | ICD-10-CM | POA: Diagnosis not present

## 2015-11-25 DIAGNOSIS — J441 Chronic obstructive pulmonary disease with (acute) exacerbation: Secondary | ICD-10-CM | POA: Diagnosis not present

## 2015-11-25 NOTE — Patient Instructions (Signed)
Try your nebulizer treatments to see if they help the breathing discomfort  Ok to continue routine meds  Please call if we can help

## 2015-11-25 NOTE — Assessment & Plan Note (Signed)
Remains dependent on oxygen and I doubt this will improve.

## 2015-11-25 NOTE — Progress Notes (Signed)
Patient ID: [name], female    DOB: [birth year], 75 y.o.   MRN: [id number]  HPI 03/21/11- 80 yoF followed for COPD, ongoing tobacco use, complicated by CAD Last here Nov 18, 2010- note reviewed.  Continues in a Spiriva drug trial CXR 01/25/11 - stable COPD, NAD She continues to find that sleeping with oxygen makes her feel much better. Continues Advair and Spiriva regularly. Rare need for rescue inhaler.  07/08/11 62 yoF followed for COPD, ongoing tobacco use, complicated by CAD, DM   Husband and daughter here Acute visit- Dyspnea   She complains of feeling somehow bad with more short of breath particularly in the last 2-3 weeks. Her husband says she has been steadily declining, weaker, for 4 or 5 months. She complains of pain in her left bicep if she tries to reach around to her back but this is not exertional pain otherwise and she denies angina-type pain or pleuritic pain. Mainly she is complaining of shortness of breath. There is some increased cough with scant clear mucus. She gets a burning sensation in the upper sternum just in the last few days. Some nausea last week. She was seen at her primary care office 4 days ago , given a Rocephin injection and started on Levaquin. Chest x-ray was done and she was told it was clear. She denies swollen glands, rash, nausea or vomiting, change in bowel or bladder , swelling or aching in legs. EKG- RSR, RAE, NAD  07/15/11-69 yoF followed for COPD, ongoing tobacco use, complicated by CAD, DM..........Marland Kitchenhusband here Much stronger and feeling much better than last time. Has reduced but not quit smoking-discussed. Notes desat walking through home on room air to as low as 83%. Discussed use of O2 for goal sat >/= 88% and discussed CO2 retention. Coughs up a little thick clear mucus.   10/31/11- 82 yoF followed for COPD, ongoing tobacco use, complicated by CAD, DM..........Marland Kitchenhusband here In past week just gotten over the breathing concerns since seen last  time and in December (when had flu like symptoms); Has been using O2 more-having low O2 levels when not on O2.(low as 75%). Unfortunately she has continued to smoke a few cigarettes daily against advice. She has been dealing with a sustained exacerbation of COPD really since October. Using oxygen continuously between 2 and 3 L per minute/Advanced. We talked about pulmonary rehabilitation. She never took the invitation to try the cone program. She said then that she would [facility] the [facility] but she didn't do that either. She paces herself at home wearing oxygen, to do limited housework. She can move a vacuum cleaner but not move furniture. Bothersome pain in the left shoulder with decreased range of motion. Little cough, phlegm, exertional chest pain, edema. No blood. PFT-09/06/2010-reviewed again with her and her husband. Very severe obstructive airways disease, emphysema pattern, FEV1/FVC 0.28, DLCO 42%. She has a home nebulizer machine with albuterol and that works better for her than metered-dose inhalers.  01/30/12- 78 yoF followed for COPD, ongoing tobacco use, complicated by CAD, DM..........Marland Kitchenhusband here  Pt states breathing is worsening, increased sob. Pt is wearing O2 24/7 at 2-3LPM, pt states she is trying to quit smoking and is currently using the electronic cigarrette rather than the tobacco. She is worrking toward quitting. Pt states mucus is increased, green-yellow phlegm Now with 2 days of green sputum and watery rhinorrhea without sore throat or fever. She restarted Spiriva   CXR 07/04/11- IMPRESSION:  COPD. No  active lung disease.  Original Report Authenticated By: Joretta Bachelor, M.D.    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 Post hospital May 16 through May 18 for exacerbation of COPD. Daughter here  today. Has finally quit smoking. Shows me some green tinged mucus, not frankly purulent. Registers a CAT score of 11.  05/24/12- 73 yoF followed for COPD, ongoing tobacco use, complicated by CAD, DM..........Marland Kitchenhusband here Has noticed somedays coughing and able to cough something up-green in color but not often; having more "hot flashes" but unware of cause. SOB with activity. Describes occasional "white bumps" on her feet.  02/01/13- 30 yoF followed for COPD, ongoing tobacco use, complicated by CAD, DM..........Marland Kitchenhusband here FOLLOWS FOR: patient states she has good days and bad days w her breahting, has had some green mucus throughout the day,decreased energy level x1week . has increased o2 to 3L at all times x3weeks--off tudorza x1 wk (ran out) Home O2 3 L with activity, 2.5 L at rest/Advanced. Quit smoking one year ago CXR 8/27/ 13 IMPRESSION:  COPD with hyperinflation. No acute cardiopulmonary disease.  Original Report Authenticated By: Truett Perna, M.D.  08/20/13- 85 yoF former smoker followed for COPD,  complicated by CAD, DM..........Marland Kitchenhusband here Home O2 3 L  at rest/Advanced. FOLLOWS FOR: Pt states she has been doing well since last visit; has not been sick either. She feels better-noticing that she was able to walk in from parking lot through cold air by herself. Waiting for portable concentrator. Brother has died of COPD  2014-02-20-  11 yoF former smoker followed for COPD,  complicated by CAD, DM...........daughter here Home O2 3 L /Advanced. FOLLOWS FOR: Pt states some days she has productive cough-green in color and then other days she cant get anything up. Has happened in the last week-tired as well but no fevers. On arrival on 4L pulse, O2 sat 84%, up to 92% on continuous. Had tripped and fractured right ankle 2 months ago-no surgery. Clonazepam not much help for insomnia CXR 08/20/13 Stable cardiac and mediastinal contours. Lungs remain hyperinflated.  No large  consolidative pulmonary opacities. No pleural effusion or  pneumothorax. Regional skeleton is unremarkable.  IMPRESSION:  Stable chest radiograph.  Electronically Signed  By: Lovey Newcomer M.D.  On: 08/20/2013 13:51   08/19/14- 46 yoF former smoker followed for COPD,  complicated by CAD, DM..........Marland Kitchenhusband here    Had flu vax FOLLOWS FOR: Continues to have SOB with exertion if O2 2-3L is not up enough; DME is [facility]. Occ up at night for neb. DOE. No acute event or fluid retention, chest pain or palpitation.  11/03/2014 Acute OV -former smoker followed for COPD,  complicated by CAD, DM. On home O2 at 2-3l/m . Remains on Spiriva and Advair.  Complains of CY pt here for increased SOB, dry cough, wheezing/rattling at night, decreased energy x4-5days.  denies f/c/s, hemoptysis, PND or leg swelling.   Began Augmentin 1/29 x7days No hemoptysis , chest pian, orthopnea, or edema.   12/19/14-  Acute OV -former smoker followed for COPD,  complicated by CAD, DM.   Husband here FOLLOWS FOR: Pt having good and bad days with breathing.  Pt has noticed pain in right side and back for about 1 week ago. O2 2-3L/M Advanced Sputum sometimes gray or green with no blood, fever or sweat. She has not been using Advair routinely.  When shortness of breath gets worse she will use her nebulizer 3 or 4 times a day. Has had sharp "grabbing" pains right lateral chest intermittently-not really pleuritic and not clearly related to twisting or bending, x3 days. CXR 08/20/14 IMPRESSION: 1. COPD. Pleural parenchymal scarring . 2. No acute abnormality. Electronically Signed  By: Marcello Moores Register  On: 08/20/2014 08:56  06/05/15- 88 yoF former smoker followed for COPD,  complicated by CAD/ stent/MI, DM..........Marland Kitchenhusband here    FOLLOWS FOR: Still has good and bad days due to weather changes.  Had hard time with extreme heat. Pt is having pain with breathing on left upper side of back and comes around and down her left arm for  past several days. O2 2-3L/M Advanced After using a back massage device 7-10 days ago she began having pain centered on the left scapula but up into the left side of her neck and left upper arm to elbow. Not affected by moving the arm or shoulder. Nonexertional. Pain came on once while lying in bed and seemed relieved by taking 1 standard aspirin. She has not tried nitroglycerin. CXR - 12/19/14 IMPRESSION: Stable chronic lung disease. No superimposed acute findings are identified. Electronically Signed  By: Genevie Ann M.D.  On: 12/19/2014 12:01  11/25/2015-75 year old female former smoker followed for COPD, chronic hypoxic respiratory failure, complicated by CAD/stents/MI, DM O2 2. 5-3 liters continuous and portable /Advanced Seen 10/28/2015 for acute hospital (acute respiratory failure) follow-up. Note reviewed   Husband here FOLLOWS FOR: Pt states she continues to cough-productive slightly-beige in color-hard to get come up-feels stuck in chest. Feels she needs to cough something out from trachea but scant sputum is usually white. Using Mucinex but admits she has not tried her nebulizer machine. Liked a DM cough syrup her husband has  ROS-see HPI   Negative unless "+" Constitutional:    weight loss, night sweats, fevers, chills, fatigue, lassitude. HEENT:    headaches, difficulty swallowing, tooth/dental problems, sore throat,       sneezing, itching, ear ache, nasal congestion, post nasal drip, snoring CV:    +chest pain, orthopnea, PND, swelling in lower extremities, anasarca,                                                             dizziness, palpitations Resp:   +shortness of breath with exertion or at rest.                productive cough,   +non-productive cough, coughing up of blood.              change in color of mucus.  wheezing.   Skin:    rash or lesions. GI:  No-   heartburn, indigestion, abdominal pain, nausea, vomiting,  GU: . MS:   joint pain, stiffness, . Neuro-      nothing unusual Psych:  change in mood or affect.  depression or anxiety.   memory loss.  OBJ- Physical Exam   O2 3L General- Alert, Oriented, Affect-appropriate, Distress- none acute Skin- rash-none, lesions- none, excoriation- none Lymphadenopathy- none Head- atraumatic            Eyes- Gross vision intact, PERRLA, conjunctivae and secretions clear            Ears- Hearing, canals-normal  Nose- Clear, no-Septal dev, mucus, polyps, erosion, perforation             Throat- Mallampati II , mucosa clear , drainage- none, tonsils- atrophic Neck- flexible , trachea midline, no stridor , thyroid nl, carotid no bruit Chest - symmetrical excursion , unlabored           Heart/CV- RRR , no murmur , no gallop  , no rub, nl s1 s2                           - JVD- none , edema- none, stasis changes- none, varices- none           Lung- clear to P&A /very distant, unlabored,  wheeze- none, cough + rattling upper airway , dullness-none, rub- none           Chest wall- no rub, no tenderness to light pressure Abd-  Br/ Gen/ Rectal- Not done, not indicated Extrem- cyanosis- none, clubbing, none, atrophy- none, strength- nl Neuro- grossly intact to observation

## 2015-11-25 NOTE — Assessment & Plan Note (Signed)
Continues slow improvement after hospitalization for exacerbation with respiratory failure. Residual tracheobronchitis. Plan-okay to use husband's DM cough syrup. Try using nebulizer machine to see if that eases chest sensation.

## 2015-11-28 ENCOUNTER — Other Ambulatory Visit: Payer: Self-pay | Admitting: Internal Medicine

## 2015-12-02 ENCOUNTER — Ambulatory Visit (INDEPENDENT_AMBULATORY_CARE_PROVIDER_SITE_OTHER): Payer: Medicare Other | Admitting: Cardiovascular Disease

## 2015-12-02 ENCOUNTER — Encounter: Payer: Self-pay | Admitting: Cardiovascular Disease

## 2015-12-02 VITALS — BP 118/64 | HR 90 | Ht 61.0 in | Wt 118.6 lb

## 2015-12-02 DIAGNOSIS — F17201 Nicotine dependence, unspecified, in remission: Secondary | ICD-10-CM

## 2015-12-02 DIAGNOSIS — I25110 Atherosclerotic heart disease of native coronary artery with unstable angina pectoris: Secondary | ICD-10-CM

## 2015-12-02 DIAGNOSIS — I10 Essential (primary) hypertension: Secondary | ICD-10-CM | POA: Diagnosis not present

## 2015-12-02 DIAGNOSIS — I779 Disorder of arteries and arterioles, unspecified: Secondary | ICD-10-CM | POA: Diagnosis not present

## 2015-12-02 DIAGNOSIS — I739 Peripheral vascular disease, unspecified: Principal | ICD-10-CM

## 2015-12-02 NOTE — Patient Instructions (Addendum)
Medication Instructions:  Your physician recommends that you continue on your current medications as directed. Please refer to the Current Medication list given to you today.   Labwork: none  Testing/Procedures: Your physician has requested that you have a carotid duplex. This test is an ultrasound of the carotid arteries in your neck. It looks at blood flow through these arteries that supply the brain with blood. Allow one hour for this exam. There are no restrictions or special instructions.  To be done in July     Follow-Up: Your physician wants you to follow-up in: 12 months.  You will receive a reminder letter in the mail two months in advance. If you don't receive a letter, please call our office to schedule the follow-up appointment.   Any Other Special Instructions Will Be Listed Below (If Applicable).     If you need a refill on your cardiac medications before your next appointment, please call your pharmacy.

## 2015-12-02 NOTE — Progress Notes (Signed)
Chief Complaint  Patient presents with  . Follow-up     History of Present Illness: 75 yo WF with prior history of HTN, HLD, COPD and CAD status post stenting of right coronary artery in 2005 and LAD in 2006 here for cardiac follow up. She has been followed in the past by Dr. Haroldine Laws. She underwent cardiac catheterization on July 09, 2010 due to CP, showed patent stents in the LAD and RCA with nonobstructive disease throughout. It was felt the majority of her symptoms were probably due to COPD and she was counseled to stop smoking. She was sent home on Imdur. The patient said she became extremely dizzy after she took one dose and had to stop it. Referred to pulmonary. Saw Dr. Annamaria Boots. PFTs with severe COPD. She has not tolerated beta blockers due to fatigue. She has not tolerated statins due to arm and leg pains. Carotid dopplers July 2016 with stable 40-59% bilateral ICA stenosis. Stress myoview July 2016 with no ischemia, large scar. She was admitted to [facility] with respiratory failure January 2017. This was due to pneumonia and COPD exacerbation.   She is here today for follow up. No chest pain. No change in weight. No palpitations. No LE edema. Breathing is at baseline.   Primary Care Physician: [facility]  Past Medical History  Diagnosis Date  . CAD (coronary artery disease)     Stent RCA 2005, stent LAD 2006  . Hypertension   . Hyperlipemia   . COPD (chronic obstructive pulmonary disease) ([place])   . Tobacco abuse   . Fatigue   . Shortness of breath   . Myocardial infarction ([place])   . Heart murmur   . Recurrent upper respiratory infection (URI)   . Depression   . Carotid artery occlusion   . Diabetes mellitus without complication [facility])     Past Surgical History  Procedure Laterality Date  . Coronary stent placement      drug-eluting; right coronary artery  . Vesicovaginal fistula closure w/ tah    . Coronary angiography  Nov 25, 2004    CYPHER stenting,  left anterior descending  . Cardiac catheterization  2011    Current Outpatient Prescriptions  Medication Sig Dispense Refill  . ADVAIR DISKUS 250-50 MCG/DOSE AEPB INHALE 1 PUFF EVERY 12HRS 60 each 1  . albuterol (PROVENTIL) (2.5 MG/3ML) 0.083% nebulizer solution USE 1 VIAL IN NEBULIZER EVERY 6HRS FOR WHEEZING 75 mL 5  . Albuterol Sulfate (PROAIR RESPICLICK) 123XX123 (90 BASE) MCG/ACT AEPB Inhale 2 puffs into the lungs every 6 (six) hours as needed. (Patient taking differently: Inhale 2 puffs into the lungs every 6 (six) hours as needed (shortness of breath/ wheezing). ) 1 each 11  . Ascorbic Acid (VITAMIN C) 1000 MG tablet Take 1,000 mg by mouth daily.    Marland Kitchen aspirin EC 81 MG tablet Take 81 mg by mouth at bedtime.     . Biotin (BIOTIN MAXIMUM STRENGTH) 10 MG TABS Take 10 mg by mouth daily. 10,000 mcg    . Cholecalciferol (VITAMIN D) 2000 units tablet Take 2,000 Units by mouth daily.    Marland Kitchen CINNAMON PO Take 1,000 mg by mouth 2 (two) times daily.     . clonazePAM (KLONOPIN) 1 MG tablet Take 1 mg by mouth at bedtime. Take 1 tablet (1 mg) by mouth every morning, may take an additional tablet later in the day as needed for anxiety    . guaiFENesin (MUCINEX) 600 MG 12 hr tablet Take 1  tablet (600 mg total) by mouth 2 (two) times daily.    . Melatonin 5 MG TABS Take 5 mg by mouth at bedtime.    . metFORMIN (GLUCOPHAGE-XR) 500 MG 24 hr tablet Take 1 tablet (500 mg) by mouth daily with breakfast, take 2 tablets (1000 mg) with supper  3  . Multiple Vitamin (MULTIVITAMIN WITH MINERALS) TABS tablet Take 1 tablet by mouth daily.    . nitroGLYCERIN (NITROSTAT) 0.4 MG SL tablet Place 1 tablet (0.4 mg total) under the tongue every 5 (five) minutes as needed for chest pain. 25 tablet 6  . OXYGEN Inhale into the lungs continuous. 2 1/2 - 3 L    . Red Yeast Rice 600 MG TABS Take 600 mg by mouth daily.    Marland Kitchen SPIRIVA HANDIHALER 18 MCG inhalation capsule INHALE 1 THE CONTENTS OF 1 CAPSULE DAILY 30 capsule 8  . vitamin B-12  (CYANOCOBALAMIN) 1000 MCG tablet Take 1,000 mcg by mouth daily.     No current facility-administered medications for this visit.    Allergies  Allergen Reactions  . Bactrim [Sulfamethoxazole-Trimethoprim] Nausea And Vomiting  . Codeine Other (See Comments)    Unknown reaction but cannot take per husband  . Vytorin [Ezetimibe-Simvastatin] Other (See Comments)    Reaction: possibly malaise per husband    Social History   Social History  . Marital Status: Married    Spouse Name: N/A  . Number of Children: 3  . Years of Education: N/A   Occupational History  . Retired Administrator   Social History Main Topics  . Smoking status: Former Smoker -- 0.25 packs/day for 50 years    Types: Cigarettes    Quit date: 02/15/2012  . Smokeless tobacco: Never Used     Comment: started smoking at age 20/// Pt is currently trying to quit smoking, using the electronic cigs, not longer smoking tobacco  . Alcohol Use: Yes     Comment: occasional wine  . Drug Use: No  . Sexual Activity: Not Currently    Birth Control/ Protection: Post-menopausal   Other Topics Concern  . Not on file   Social History Narrative   Husband smokes cigars    Family History  Problem Relation Age of Onset  . Diabetes Mother     deceased  . Heart disease Mother   . Alzheimer's disease Father     deceased  . Diabetes Sister   . Heart attack Sister     Review of Systems:  As stated in the HPI and otherwise negative.   BP 118/64 mmHg  Pulse 90  Ht 5\' 1"  (1.549 m)  Wt 118 lb 9.6 oz (53.797 kg)  BMI 22.42 kg/m2  SpO2 96%  Physical Examination: General: Well developed, well nourished, NAD HEENT: OP clear, mucus membranes moist SKIN: warm, dry. No rashes. Neuro: No focal deficits Musculoskeletal: Muscle strength 5/5 all ext Psychiatric: Mood and affect normal Neck: No JVD, no carotid bruits, no thyromegaly, no lymphadenopathy. Lungs:Clear bilaterally, no wheezes, rhonci,  crackles Cardiovascular: Regular rate and rhythm. No murmurs, gallops or rubs. Abdomen:Soft. Bowel sounds present. Non-tender.  Extremities: No lower extremity edema. Pulses are 2 + in the bilateral DP/PT.  Carotid artery dopplers: 04/09/15: 40-59% bilateral stenosis. Patent vertebral   EKG:  EKG is not ordered today. The ekg ordered today demonstrates   Recent Labs: 10/12/2015: B Natriuretic Peptide 80.4 10/13/2015: ALT 16 10/17/2015: BUN 21*; Creatinine, Ser 0.72; Magnesium 2.2; Potassium 4.6; Sodium 141 10/28/2015: Hemoglobin 12.6;  Platelets 253.0   Lipid Panel    Component Value Date/Time   CHOL 149 08/17/2012 1059   TRIG 124 10/12/2015 1315   HDL 51.70 08/17/2012 1059   CHOLHDL 3 08/17/2012 1059   VLDL 45.4* 08/17/2012 1059   LDLDIRECT 67.3 08/17/2012 1059     Wt Readings from Last 3 Encounters:  12/02/15 118 lb 9.6 oz (53.797 kg)  11/25/15 120 lb 6.4 oz (54.613 kg)  10/29/15 121 lb (54.885 kg)     Other studies Reviewed: Additional studies/ records that were reviewed today include: . Review of the above records demonstrates:    Assessment and Plan:   1. TOBACCO ABUSE, in remission: She has stopped smoking in May 2013. She has severe COPD.  2. CAD: No recent chest pain concerning for angina. Nuclear stress test July 2016 with no ischemia. Continue ASA. She does not tolerate statins or beta blockers.   3. CAROTID ARTERY DISEASE : Stable by dopplers July 2016. Repeat July 2017.   4. HTN: BP controlled. No changes   Current medicines are reviewed at length with the patient today.  The patient does not have concerns regarding medicines.  The following changes have been made:  no change  Labs/ tests ordered today include:   No orders of the defined types were placed in this encounter.    Disposition:   FU with me  in 8 weeks  Signed, Lauree Chandler, MD 12/02/2015 5:09 PM    [facility] [address], [place], [place]  [address] Phone:  [phone]; Fax: [phone]

## 2015-12-03 ENCOUNTER — Telehealth: Payer: Self-pay | Admitting: Cardiovascular Disease

## 2015-12-03 ENCOUNTER — Ambulatory Visit: Payer: Medicare Other | Admitting: Internal Medicine

## 2015-12-03 NOTE — Telephone Encounter (Signed)
Spoke with [name]. She states Dr. Angelena Form had asked her to have primary care fax [place] recent lab work. [name] reports it is being faxed to [place] today and she would like call back to make sure we received the results.

## 2015-12-03 NOTE — Telephone Encounter (Signed)
New Message  Pt requested to speak w/ RN concerning lab work that was done by PCP- is having the lab results faxed to our office. Please call back and discuss.

## 2015-12-05 DIAGNOSIS — J449 Chronic obstructive pulmonary disease, unspecified: Secondary | ICD-10-CM | POA: Diagnosis not present

## 2015-12-09 NOTE — Telephone Encounter (Signed)
Lab results not received in office yet.  I left message on voicemail for medical records department at Dr. Baird Cancer' office requesting results be refaxed to our office.

## 2015-12-14 NOTE — Telephone Encounter (Signed)
Follow Up:  Pt wants to know if you have received her lab work results from her primary doctor,Dr Bryon Lions?Marland Kitchen

## 2015-12-14 NOTE — Telephone Encounter (Signed)
Spoke with pt and told her we had not received lab work. She will contact primary care office and ask them to send to [place].

## 2015-12-15 ENCOUNTER — Encounter: Payer: Self-pay | Admitting: Cardiovascular Disease

## 2015-12-15 NOTE — Telephone Encounter (Signed)
Addendum--Labs are from 07/22/15

## 2015-12-15 NOTE — Telephone Encounter (Signed)
Labs now in chart for review (dated 07/18/16)

## 2015-12-15 NOTE — Telephone Encounter (Signed)
thanks

## 2015-12-18 ENCOUNTER — Encounter: Payer: Self-pay | Admitting: Cardiovascular Disease

## 2015-12-18 DIAGNOSIS — E119 Type 2 diabetes mellitus without complications: Secondary | ICD-10-CM | POA: Diagnosis not present

## 2015-12-18 DIAGNOSIS — E784 Other hyperlipidemia: Secondary | ICD-10-CM | POA: Diagnosis not present

## 2015-12-18 DIAGNOSIS — Z9981 Dependence on supplemental oxygen: Secondary | ICD-10-CM | POA: Diagnosis not present

## 2015-12-18 DIAGNOSIS — J449 Chronic obstructive pulmonary disease, unspecified: Secondary | ICD-10-CM | POA: Diagnosis not present

## 2015-12-21 NOTE — Telephone Encounter (Signed)
Labs reviewed with Dr. Angelena Form and since pt cannot take statins she can be referred to Lipid clinic to discuss new injectable medications.  I spoke with pt and she does not want to go to Lipid clinic at this time. Pt also reports she had lab work done this past Friday that is being sent to our office.  She would like call once it is received.

## 2015-12-24 NOTE — Telephone Encounter (Signed)
°  Follow Up   Pt is calling following up on some paperwork that faxed over to [place] from [place]. Please call.

## 2015-12-24 NOTE — Telephone Encounter (Signed)
Called patient back and let her know we received her lab work. Lab work has been scanned and in patient's chart. Patient verbalized understanding.

## 2015-12-25 ENCOUNTER — Telehealth: Payer: Self-pay | Admitting: Cardiovascular Disease

## 2015-12-25 NOTE — Telephone Encounter (Signed)
Spoke with pt and told her we have received results.

## 2015-12-25 NOTE — Telephone Encounter (Signed)
NewMessage  Pt checking to see if our office has received her A1C from her PCP. Please call back and discuss.

## 2016-01-05 DIAGNOSIS — J449 Chronic obstructive pulmonary disease, unspecified: Secondary | ICD-10-CM | POA: Diagnosis not present

## 2016-02-04 DIAGNOSIS — J449 Chronic obstructive pulmonary disease, unspecified: Secondary | ICD-10-CM | POA: Diagnosis not present

## 2016-02-25 ENCOUNTER — Telehealth: Payer: Self-pay | Admitting: Internal Medicine

## 2016-02-25 ENCOUNTER — Other Ambulatory Visit: Payer: Self-pay | Admitting: Internal Medicine

## 2016-02-25 MED ORDER — FLUTICASONE-SALMETEROL 250-50 MCG/DOSE IN AEPB: INHALATION_SPRAY | RESPIRATORY_TRACT | Status: DC

## 2016-02-25 MED ORDER — ALBUTEROL SULFATE (2.5 MG/3ML) 0.083% IN NEBU: INHALATION_SOLUTION | RESPIRATORY_TRACT | Status: DC

## 2016-02-25 NOTE — Telephone Encounter (Signed)
Spoke with pt.  Requesting rx for advair to be sent to [facility]. Rx sent - pt aware and voiced no further questions or concerns at this time.

## 2016-02-25 NOTE — Telephone Encounter (Signed)
Called spoke with patient to verify whether she needs a refill on the proair or the albuterol neb solution > pt stated neb solution Rx sent Nothing further needed; will sign off

## 2016-03-03 ENCOUNTER — Ambulatory Visit
Admission: RE | Admit: 2016-03-03 | Discharge: 2016-03-03 | Disposition: A | Discharge status: Home / Self Care | Payer: Medicare Other | Source: Ambulatory Visit

## 2016-03-03 DIAGNOSIS — Z1231 Encounter for screening mammogram for malignant neoplasm of breast: Secondary | ICD-10-CM

## 2016-03-04 ENCOUNTER — Telehealth: Payer: Self-pay | Admitting: Internal Medicine

## 2016-03-04 MED ORDER — AMOXICILLIN-POT CLAVULANATE 500-125 MG PO TABS: 1.0000 | ORAL_TABLET | Freq: Two times a day (BID) | ORAL | Status: DC

## 2016-03-04 NOTE — Telephone Encounter (Signed)
Suggest augmentin 500 mg # 14, 1 twice daily, refill x 1

## 2016-03-04 NOTE — Telephone Encounter (Signed)
Spoke with pt. Reports increased cough for 2-3 days. Cough is producing green mucus. Denies chest tightness, wheezing, SOB or fever. She has not tried any OTC medications for her cough. Would like an antibiotic called in. CY - please advise. Thanks.  Allergies  Allergen Reactions  . Bactrim [Sulfamethoxazole-Trimethoprim] Nausea And Vomiting  . Codeine Other (See Comments)    Unknown reaction but cannot take per husband  . Vytorin [Ezetimibe-Simvastatin] Other (See Comments)    Reaction: possibly malaise per husband   Current Outpatient Prescriptions on File Prior to Visit  Medication Sig Dispense Refill  . ADVAIR DISKUS 250-50 MCG/DOSE AEPB INHALE 1 PUFF EVERY 12HRS 60 each 1  . albuterol (PROVENTIL) (2.5 MG/3ML) 0.083% nebulizer solution USE 1 VIAL IN NEBULIZER EVERY 6HRS FOR WHEEZING 75 mL 5  . Albuterol Sulfate (PROAIR RESPICLICK) 123XX123 (90 BASE) MCG/ACT AEPB Inhale 2 puffs into the lungs every 6 (six) hours as needed. (Patient taking differently: Inhale 2 puffs into the lungs every 6 (six) hours as needed (shortness of breath/ wheezing). ) 1 each 11  . Ascorbic Acid (VITAMIN C) 1000 MG tablet Take 1,000 mg by mouth daily.    Marland Kitchen aspirin EC 81 MG tablet Take 81 mg by mouth at bedtime.     . Biotin (BIOTIN MAXIMUM STRENGTH) 10 MG TABS Take 10 mg by mouth daily. 10,000 mcg    . Cholecalciferol (VITAMIN D) 2000 units tablet Take 2,000 Units by mouth daily.    Marland Kitchen CINNAMON PO Take 1,000 mg by mouth 2 (two) times daily.     . clonazePAM (KLONOPIN) 1 MG tablet Take 1 mg by mouth at bedtime. Take 1 tablet (1 mg) by mouth every morning, may take an additional tablet later in the day as needed for anxiety    . guaiFENesin (MUCINEX) 600 MG 12 hr tablet Take 1 tablet (600 mg total) by mouth 2 (two) times daily.    . Melatonin 5 MG TABS Take 5 mg by mouth at bedtime.    . metFORMIN (GLUCOPHAGE-XR) 500 MG 24 hr tablet Take 1 tablet (500 mg) by mouth daily with breakfast, take 2 tablets (1000 mg) with  supper  3  . Multiple Vitamin (MULTIVITAMIN WITH MINERALS) TABS tablet Take 1 tablet by mouth daily.    . nitroGLYCERIN (NITROSTAT) 0.4 MG SL tablet Place 1 tablet (0.4 mg total) under the tongue every 5 (five) minutes as needed for chest pain. 25 tablet 6  . OXYGEN Inhale into the lungs continuous. 2 1/2 - 3 L    . Red Yeast Rice 600 MG TABS Take 600 mg by mouth daily.    Marland Kitchen SPIRIVA HANDIHALER 18 MCG inhalation capsule INHALE 1 THE CONTENTS OF 1 CAPSULE DAILY 30 capsule 8  . vitamin B-12 (CYANOCOBALAMIN) 1000 MCG tablet Take 1,000 mcg by mouth daily.     No current facility-administered medications on file prior to visit.

## 2016-03-04 NOTE — Telephone Encounter (Signed)
Spoke with pt and gave recommendations. [facility]. Pt advised to call us if not improving. Rx sent. Nothing further needed.

## 2016-03-06 DIAGNOSIS — J449 Chronic obstructive pulmonary disease, unspecified: Secondary | ICD-10-CM | POA: Diagnosis not present

## 2016-03-16 ENCOUNTER — Telehealth: Payer: Self-pay | Admitting: Internal Medicine

## 2016-03-16 DIAGNOSIS — J9612 Chronic respiratory failure with hypercapnia: Principal | ICD-10-CM

## 2016-03-16 DIAGNOSIS — J9611 Chronic respiratory failure with hypoxia: Secondary | ICD-10-CM

## 2016-03-16 NOTE — Telephone Encounter (Signed)
Spoke with pt, states that per [facility] a new order needs to be placed for pt's 02.  Pt's current order states that pt is to use 3-4lpm with exertion.   Called and spoke to [facility], states that the pt is on a 5lpm concentrator but was unable to find pt's 02 order.  CY ok to place new 02 order for pt?  Also, does any changes need to be made to pt's order?  Thanks  Allergies  Allergen Reactions  . Bactrim [Sulfamethoxazole-Trimethoprim] Nausea And Vomiting  . Codeine Other (See Comments)    Unknown reaction but cannot take per husband  . Vytorin [Ezetimibe-Simvastatin] Other (See Comments)    Reaction: possibly malaise per husband   Current Outpatient Prescriptions on File Prior to Visit  Medication Sig Dispense Refill  . ADVAIR DISKUS 250-50 MCG/DOSE AEPB INHALE 1 PUFF EVERY 12HRS 60 each 1  . albuterol (PROVENTIL) (2.5 MG/3ML) 0.083% nebulizer solution USE 1 VIAL IN NEBULIZER EVERY 6HRS FOR WHEEZING 75 mL 5  . Albuterol Sulfate (PROAIR RESPICLICK) 123XX123 (90 BASE) MCG/ACT AEPB Inhale 2 puffs into the lungs every 6 (six) hours as needed. (Patient taking differently: Inhale 2 puffs into the lungs every 6 (six) hours as needed (shortness of breath/ wheezing). ) 1 each 11  . amoxicillin-clavulanate (AUGMENTIN) 500-125 MG tablet Take 1 tablet (500 mg total) by mouth 2 (two) times daily. 14 tablet 1  . Ascorbic Acid (VITAMIN C) 1000 MG tablet Take 1,000 mg by mouth daily.    Marland Kitchen aspirin EC 81 MG tablet Take 81 mg by mouth at bedtime.     . Biotin (BIOTIN MAXIMUM STRENGTH) 10 MG TABS Take 10 mg by mouth daily. 10,000 mcg    . Cholecalciferol (VITAMIN D) 2000 units tablet Take 2,000 Units by mouth daily.    Marland Kitchen CINNAMON PO Take 1,000 mg by mouth 2 (two) times daily.     . clonazePAM (KLONOPIN) 1 MG tablet Take 1 mg by mouth at bedtime. Take 1 tablet (1 mg) by mouth every morning, may take an additional tablet later in the day as needed for anxiety    . guaiFENesin (MUCINEX) 600 MG 12 hr tablet Take 1 tablet (600 mg  total) by mouth 2 (two) times daily.    . Melatonin 5 MG TABS Take 5 mg by mouth at bedtime.    . metFORMIN (GLUCOPHAGE-XR) 500 MG 24 hr tablet Take 1 tablet (500 mg) by mouth daily with breakfast, take 2 tablets (1000 mg) with supper  3  . Multiple Vitamin (MULTIVITAMIN WITH MINERALS) TABS tablet Take 1 tablet by mouth daily.    . nitroGLYCERIN (NITROSTAT) 0.4 MG SL tablet Place 1 tablet (0.4 mg total) under the tongue every 5 (five) minutes as needed for chest pain. 25 tablet 6  . OXYGEN Inhale into the lungs continuous. 2 1/2 - 3 L    . Red Yeast Rice 600 MG TABS Take 600 mg by mouth daily.    Marland Kitchen SPIRIVA HANDIHALER 18 MCG inhalation capsule INHALE 1 THE CONTENTS OF 1 CAPSULE DAILY 30 capsule 8  . vitamin B-12 (CYANOCOBALAMIN) 1000 MCG tablet Take 1,000 mcg by mouth daily.     No current facility-administered medications on file prior to visit.

## 2016-03-17 NOTE — Telephone Encounter (Signed)
Ok to reorder O2 as 3L at rest, 5 L with ambulation

## 2016-03-17 NOTE — Telephone Encounter (Signed)
Order sent to [facility] and pt made aware  Nothing further needed

## 2016-03-25 ENCOUNTER — Encounter: Payer: Self-pay | Admitting: Internal Medicine

## 2016-03-25 ENCOUNTER — Ambulatory Visit (INDEPENDENT_AMBULATORY_CARE_PROVIDER_SITE_OTHER): Payer: Medicare Other | Admitting: Internal Medicine

## 2016-03-25 VITALS — BP 114/70 | HR 89 | Ht 61.0 in | Wt 119.6 lb

## 2016-03-25 DIAGNOSIS — J9612 Chronic respiratory failure with hypercapnia: Secondary | ICD-10-CM | POA: Diagnosis not present

## 2016-03-25 DIAGNOSIS — Z7189 Other specified counseling: Secondary | ICD-10-CM

## 2016-03-25 DIAGNOSIS — J310 Chronic rhinitis: Secondary | ICD-10-CM

## 2016-03-25 DIAGNOSIS — J9611 Chronic respiratory failure with hypoxia: Secondary | ICD-10-CM | POA: Diagnosis not present

## 2016-03-25 NOTE — Assessment & Plan Note (Signed)
Chronic hypoxic respiratory failure. She remains definitely oxygen dependent and compliant, usually at 4 L.

## 2016-03-25 NOTE — Patient Instructions (Addendum)
For mouth breathing- you could try otc chin strap for snoring,   For stuffy nose- you can try otc flonase nasal spray, Breathe Right nasal strips, or saline nasal spray

## 2016-03-25 NOTE — Assessment & Plan Note (Signed)
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 She gets adequate oxygen supply with mouth open but we discussed use of Flonase, Breathe Right nasal strips and saline nasal spray.

## 2016-03-25 NOTE — Progress Notes (Signed)
Patient ID: [name], female    DOB: [birth year], 75 y.o.   MRN: [id number]  HPI 03/21/11- 80 yoF followed for COPD, ongoing tobacco use, complicated by CAD Last here Nov 18, 2010- note reviewed.  Continues in a Spiriva drug trial CXR 01/25/11 - stable COPD, NAD She continues to find that sleeping with oxygen makes her feel much better. Continues Advair and Spiriva regularly. Rare need for rescue inhaler.  07/08/11 62 yoF followed for COPD, ongoing tobacco use, complicated by CAD, DM   Husband and daughter here Acute visit- Dyspnea   She complains of feeling somehow bad with more short of breath particularly in the last 2-3 weeks. Her husband says she has been steadily declining, weaker, for 4 or 5 months. She complains of pain in her left bicep if she tries to reach around to her back but this is not exertional pain otherwise and she denies angina-type pain or pleuritic pain. Mainly she is complaining of shortness of breath. There is some increased cough with scant clear mucus. She gets a burning sensation in the upper sternum just in the last few days. Some nausea last week. She was seen at her primary care office 4 days ago , given a Rocephin injection and started on Levaquin. Chest x-ray was done and she was told it was clear. She denies swollen glands, rash, nausea or vomiting, change in bowel or bladder , swelling or aching in legs. EKG- RSR, RAE, NAD  07/15/11-69 yoF followed for COPD, ongoing tobacco use, complicated by CAD, DM..........Marland Kitchenhusband here Much stronger and feeling much better than last time. Has reduced but not quit smoking-discussed. Notes desat walking through home on room air to as low as 83%. Discussed use of O2 for goal sat >/= 88% and discussed CO2 retention. Coughs up a little thick clear mucus.   10/31/11- 82 yoF followed for COPD, ongoing tobacco use, complicated by CAD, DM..........Marland Kitchenhusband here In past week just gotten over the breathing concerns since seen last  time and in December (when had flu like symptoms); Has been using O2 more-having low O2 levels when not on O2.(low as 75%). Unfortunately she has continued to smoke a few cigarettes daily against advice. She has been dealing with a sustained exacerbation of COPD really since October. Using oxygen continuously between 2 and 3 L per minute/Advanced. We talked about pulmonary rehabilitation. She never took the invitation to try the [facility] program. She said then that she would [facility] but she didn't do that either. She paces herself at home wearing oxygen, to do limited housework. She can move a vacuum cleaner but not move furniture. Bothersome pain in the left shoulder with decreased range of motion. Little cough, phlegm, exertional chest pain, edema. No blood. PFT-09/06/2010-reviewed again with her and her husband. Very severe obstructive airways disease, emphysema pattern, FEV1/FVC 0.28, DLCO 42%. She has a home nebulizer machine with albuterol and that works better for her than metered-dose inhalers.  01/30/12- 78 yoF followed for COPD, ongoing tobacco use, complicated by CAD, DM..........Marland Kitchenhusband here  Pt states breathing is worsening, increased sob. Pt is wearing O2 24/7 at 2-3LPM, pt states she is trying to quit smoking and is currently using the electronic cigarrette rather than the tobacco. She is worrking toward quitting. Pt states mucus is increased, green-yellow phlegm Now with 2 days of green sputum and watery rhinorrhea without sore throat or fever. She restarted Spiriva   CXR 07/04/11- IMPRESSION:  COPD. No  active lung disease.  Original Report Authenticated By: Joretta Bachelor, M.D.    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 Post hospital May 16 through May 18 for exacerbation of COPD. Daughter here  today. Has finally quit smoking. Shows me some green tinged mucus, not frankly purulent. Registers a CAT score of 11.  05/24/12- 73 yoF followed for COPD, ongoing tobacco use, complicated by CAD, DM..........Marland Kitchenhusband here Has noticed somedays coughing and able to cough something up-green in color but not often; having more "hot flashes" but unware of cause. SOB with activity. Describes occasional "white bumps" on her feet.  02/01/13- 30 yoF followed for COPD, ongoing tobacco use, complicated by CAD, DM..........Marland Kitchenhusband here FOLLOWS FOR: patient states she has good days and bad days w her breahting, has had some green mucus throughout the day,decreased energy level x1week . has increased o2 to 3L at all times x3weeks--off tudorza x1 wk (ran out) Home O2 3 L with activity, 2.5 L at rest/Advanced. Quit smoking one year ago CXR 8/27/ 13 IMPRESSION:  COPD with hyperinflation. No acute cardiopulmonary disease.  Original Report Authenticated By: Truett Perna, M.D.  08/20/13- 85 yoF former smoker followed for COPD,  complicated by CAD, DM..........Marland Kitchenhusband here Home O2 3 L  at rest/Advanced. FOLLOWS FOR: Pt states she has been doing well since last visit; has not been sick either. She feels better-noticing that she was able to walk in from parking lot through cold air by herself. Waiting for portable concentrator. Brother has died of COPD  2014-02-20-  11 yoF former smoker followed for COPD,  complicated by CAD, DM...........daughter here Home O2 3 L /Advanced. FOLLOWS FOR: Pt states some days she has productive cough-green in color and then other days she cant get anything up. Has happened in the last week-tired as well but no fevers. On arrival on 4L pulse, O2 sat 84%, up to 92% on continuous. Had tripped and fractured right ankle 2 months ago-no surgery. Clonazepam not much help for insomnia CXR 08/20/13 Stable cardiac and mediastinal contours. Lungs remain hyperinflated.  No large  consolidative pulmonary opacities. No pleural effusion or  pneumothorax. Regional skeleton is unremarkable.  IMPRESSION:  Stable chest radiograph.  Electronically Signed  By: Lovey Newcomer M.D.  On: 08/20/2013 13:51   08/19/14- 46 yoF former smoker followed for COPD,  complicated by CAD, DM..........Marland Kitchenhusband here    Had flu vax FOLLOWS FOR: Continues to have SOB with exertion if O2 2-3L is not up enough; DME is [facility]. Occ up at night for neb. DOE. No acute event or fluid retention, chest pain or palpitation.  11/03/2014 Acute OV -former smoker followed for COPD,  complicated by CAD, DM. On home O2 at 2-3l/m . Remains on Spiriva and Advair.  Complains of CY pt here for increased SOB, dry cough, wheezing/rattling at night, decreased energy x4-5days.  denies f/c/s, hemoptysis, PND or leg swelling.   Began Augmentin 1/29 x7days No hemoptysis , chest pian, orthopnea, or edema.   12/19/14-  Acute OV -former smoker followed for COPD,  complicated by CAD, DM.   Husband here FOLLOWS FOR: Pt having good and bad days with breathing.  Pt has noticed pain in right side and back for about 1 week ago. O2 2-3L/M Advanced Sputum sometimes gray or green with no blood, fever or sweat. She has not been using Advair routinely.  When shortness of breath gets worse she will use her nebulizer 3 or 4 times a day. Has had sharp "grabbing" pains right lateral chest intermittently-not really pleuritic and not clearly related to twisting or bending, x3 days. CXR 08/20/14 IMPRESSION: 1. COPD. Pleural parenchymal scarring . 2. No acute abnormality. Electronically Signed  By: Marcello Moores Register  On: 08/20/2014 08:56  06/05/15- 63 yoF former smoker followed for COPD,  complicated by CAD/ stent/MI, DM..........Marland Kitchenhusband here    FOLLOWS FOR: Still has good and bad days due to weather changes.  Had hard time with extreme heat. Pt is having pain with breathing on left upper side of back and comes around and down her left arm for  past several days. O2 2-3L/M Advanced After using a back massage device 7-10 days ago she began having pain centered on the left scapula but up into the left side of her neck and left upper arm to elbow. Not affected by moving the arm or shoulder. Nonexertional. Pain came on once while lying in bed and seemed relieved by taking 1 standard aspirin. She has not tried nitroglycerin. CXR - 12/19/14 IMPRESSION: Stable chronic lung disease. No superimposed acute findings are identified. Electronically Signed  By: Genevie Ann M.D.  On: 12/19/2014 12:01  11/25/2015-75 year old female former smoker followed for COPD, chronic hypoxic respiratory failure, complicated by CAD/stents/MI, DM O2 2. 5-3 liters continuous and portable /Advanced Seen 10/28/2015 for acute hospital (acute respiratory failure) follow-up. Note reviewed   Husband here FOLLOWS FOR: Pt states she continues to cough-productive slightly-beige in color-hard to get come up-feels stuck in chest. Feels she needs to cough something out from trachea but scant sputum is usually white. Using Mucinex but admits she has not tried her nebulizer machine. Liked a DM cough syrup her husband has  03/25/2016-75 year old female former smoker followed for COPD, chronic hypoxic respiratory failure, complicated by CAD/stents/MI, DM Husband here FOLLOWS FOR: Pt has been coughing more than usual; breathing has not gotten any better-sits in chair and pants for air even with O2 on. She depends on continuous oxygen. Worries about mouth breathing, thinking that means she is not getting oxygen in if she wears a nasal cannula. Aware of nasal septal deviation and some persistent nasal stuffiness but she can breathe comfortably through her nose if she deliberately closes her mouth. End of Life Discussion-reviewed options briefly. She would accept short-term intubation if we thought a problem was reversible, such as an acute pneumonia. I suggested she share her thoughts  with her family so they would understand her wishes. I think she is pretty realistic and understands she has end-stage lung disease.  ROS-see HPI   Negative unless "+" Constitutional:    weight loss, night sweats, fevers, chills, fatigue, lassitude. HEENT:    headaches, difficulty swallowing, tooth/dental problems, sore throat,       sneezing, itching, ear ache, nasal congestion, post nasal drip, snoring CV:    +chest pain, orthopnea, PND, swelling in lower extremities, anasarca,                                                             dizziness, palpitations Resp:   +shortness of breath with exertion or at rest.                productive cough,   +  non-productive cough, coughing up of blood.              change in color of mucus.  wheezing.   Skin:    rash or lesions. GI:  No-   heartburn, indigestion, abdominal pain, nausea, vomiting,  GU: . MS:   joint pain, stiffness, . Neuro-     nothing unusual Psych:  change in mood or affect.  depression or anxiety.   memory loss.  OBJ- Physical Exam   O2 3L General- Alert, Oriented, Affect-appropriate, Distress- none acute Skin- rash-none, lesions- none, excoriation- none Lymphadenopathy- none Head- atraumatic            Eyes- Gross vision intact, PERRLA, conjunctivae and secretions clear            Ears- Hearing, canals-normal            Nose- Clear, no-Septal dev, mucus, polyps, erosion, perforation             Throat- Mallampati II , mucosa clear , drainage- none, tonsils- atrophic Neck- flexible , trachea midline, no stridor , thyroid nl, carotid no bruit Chest - symmetrical excursion , unlabored           Heart/CV- RRR , no murmur , no gallop  , no rub, nl s1 s2                           - JVD- none , edema- none, stasis changes- none, varices- none           Lung- clear to P&A /very distant, unlabored,  wheeze- none, cough-none , dullness-none, rub- none           Chest wall- no rub, no tenderness to light pressure Abd-  Br/ Gen/  Rectal- Not done, not indicated Extrem- cyanosis- none, clubbing, none, atrophy- none, strength- nl Neuro- grossly intact to observation

## 2016-04-22 DIAGNOSIS — E784 Other hyperlipidemia: Secondary | ICD-10-CM | POA: Diagnosis not present

## 2016-04-22 DIAGNOSIS — R21 Rash and other nonspecific skin eruption: Secondary | ICD-10-CM | POA: Diagnosis not present

## 2016-04-22 DIAGNOSIS — E119 Type 2 diabetes mellitus without complications: Secondary | ICD-10-CM | POA: Diagnosis not present

## 2016-04-22 DIAGNOSIS — Z9981 Dependence on supplemental oxygen: Secondary | ICD-10-CM | POA: Diagnosis not present

## 2016-04-29 ENCOUNTER — Ambulatory Visit (HOSPITAL_COMMUNITY)
Admission: RE | Admit: 2016-04-29 | Discharge: 2016-04-29 | Disposition: A | Discharge status: Home / Self Care | Payer: Medicare Other | Source: Ambulatory Visit | Attending: Cardiology | Admitting: Cardiology

## 2016-04-29 DIAGNOSIS — J449 Chronic obstructive pulmonary disease, unspecified: Secondary | ICD-10-CM | POA: Diagnosis not present

## 2016-04-29 DIAGNOSIS — E119 Type 2 diabetes mellitus without complications: Secondary | ICD-10-CM | POA: Insufficient documentation

## 2016-04-29 DIAGNOSIS — I10 Essential (primary) hypertension: Secondary | ICD-10-CM | POA: Insufficient documentation

## 2016-04-29 DIAGNOSIS — Z720 Tobacco use: Secondary | ICD-10-CM | POA: Insufficient documentation

## 2016-04-29 DIAGNOSIS — I6523 Occlusion and stenosis of bilateral carotid arteries: Secondary | ICD-10-CM | POA: Insufficient documentation

## 2016-04-29 DIAGNOSIS — F329 Major depressive disorder, single episode, unspecified: Secondary | ICD-10-CM | POA: Insufficient documentation

## 2016-04-29 DIAGNOSIS — I2510 Atherosclerotic heart disease of native coronary artery without angina pectoris: Secondary | ICD-10-CM | POA: Diagnosis not present

## 2016-04-29 DIAGNOSIS — E785 Hyperlipidemia, unspecified: Secondary | ICD-10-CM | POA: Diagnosis not present

## 2016-04-29 DIAGNOSIS — I779 Disorder of arteries and arterioles, unspecified: Secondary | ICD-10-CM | POA: Diagnosis not present

## 2016-04-29 DIAGNOSIS — I739 Peripheral vascular disease, unspecified: Secondary | ICD-10-CM

## 2016-05-16 ENCOUNTER — Telehealth: Payer: Self-pay | Admitting: Internal Medicine

## 2016-05-16 MED ORDER — PREDNISONE 10 MG PO TABS: ORAL_TABLET | ORAL | 2 refills | Status: DC

## 2016-05-16 NOTE — Telephone Encounter (Signed)
Offer prednisone 10 mg, # 20, 4 X 2 DAYS, 3 X 2 DAYS, 2 X 2 DAYS, 1 X 2 DAYS    Ref x 2

## 2016-05-16 NOTE — Telephone Encounter (Signed)
Called spoke with pt. She is requesting prednisone. C/o hoarseness, SOB w/ exertion, very little cough.  Please advise Dr. Annamaria Boots. Thanks  Allergies  Allergen Reactions  . Bactrim [Sulfamethoxazole-Trimethoprim] Nausea And Vomiting  . Codeine Other (See Comments)    Unknown reaction but cannot take per husband  . Vytorin [Ezetimibe-Simvastatin] Other (See Comments)    Reaction: possibly malaise per husband     Current Outpatient Prescriptions on File Prior to Visit  Medication Sig Dispense Refill  . ADVAIR DISKUS 250-50 MCG/DOSE AEPB INHALE 1 PUFF EVERY 12HRS 60 each 1  . albuterol (PROVENTIL) (2.5 MG/3ML) 0.083% nebulizer solution USE 1 VIAL IN NEBULIZER EVERY 6HRS FOR WHEEZING 75 mL 5  . Albuterol Sulfate (PROAIR RESPICLICK) 123XX123 (90 BASE) MCG/ACT AEPB Inhale 2 puffs into the lungs every 6 (six) hours as needed. (Patient taking differently: Inhale 2 puffs into the lungs every 6 (six) hours as needed (shortness of breath/ wheezing). ) 1 each 11  . Ascorbic Acid (VITAMIN C) 1000 MG tablet Take 1,000 mg by mouth daily.    Marland Kitchen aspirin EC 81 MG tablet Take 81 mg by mouth at bedtime.     . Biotin (BIOTIN MAXIMUM STRENGTH) 10 MG TABS Take 10 mg by mouth daily. 10,000 mcg    . Cholecalciferol (VITAMIN D) 2000 units tablet Take 2,000 Units by mouth daily.    Marland Kitchen CINNAMON PO Take 1,000 mg by mouth 2 (two) times daily.     . clonazePAM (KLONOPIN) 1 MG tablet Take 1 mg by mouth at bedtime. Take 1 tablet (1 mg) by mouth every morning, may take an additional tablet later in the day as needed for anxiety    . guaiFENesin (MUCINEX) 600 MG 12 hr tablet Take 1 tablet (600 mg total) by mouth 2 (two) times daily.    . Melatonin 5 MG TABS Take 5 mg by mouth at bedtime.    . metFORMIN (GLUCOPHAGE-XR) 500 MG 24 hr tablet Take 1 tablet (500 mg) by mouth daily with breakfast, take 2 tablets (1000 mg) with supper  3  . Multiple Vitamin (MULTIVITAMIN WITH MINERALS) TABS tablet Take 1 tablet by mouth daily.    .  nitroGLYCERIN (NITROSTAT) 0.4 MG SL tablet Place 1 tablet (0.4 mg total) under the tongue every 5 (five) minutes as needed for chest pain. 25 tablet 6  . OXYGEN Inhale into the lungs continuous. 2 1/2 - 3 L    . Red Yeast Rice 600 MG TABS Take 600 mg by mouth daily.    Marland Kitchen SPIRIVA HANDIHALER 18 MCG inhalation capsule INHALE 1 THE CONTENTS OF 1 CAPSULE DAILY 30 capsule 8  . vitamin B-12 (CYANOCOBALAMIN) 1000 MCG tablet Take 1,000 mcg by mouth daily.     No current facility-administered medications on file prior to visit.

## 2016-05-16 NOTE — Telephone Encounter (Signed)
Called spoke with pt. Reviewed recs, verified pharmacy as [facility] on [address]. She voiced understanding and had no further questions. Rx sent. Nothing further needed.

## 2016-06-13 ENCOUNTER — Telehealth: Payer: Self-pay | Admitting: Internal Medicine

## 2016-06-13 NOTE — Telephone Encounter (Signed)
Spoke with pt. States that she is still having issues with a productive cough. She has had 2 rounds of prednisone with no relief. Advised her that since she has had 2 rounds of prednisone we would need to see her. Pt has been scheduled to see TP tomorrow at 12pm. Nothing further was needed.

## 2016-06-14 ENCOUNTER — Other Ambulatory Visit (INDEPENDENT_AMBULATORY_CARE_PROVIDER_SITE_OTHER): Payer: Medicare Other

## 2016-06-14 ENCOUNTER — Ambulatory Visit (INDEPENDENT_AMBULATORY_CARE_PROVIDER_SITE_OTHER)
Admission: RE | Admit: 2016-06-14 | Discharge: 2016-06-14 | Disposition: A | Discharge status: Home / Self Care | Payer: Medicare Other | Source: Ambulatory Visit | Attending: Adult Health | Admitting: Adult Health

## 2016-06-14 ENCOUNTER — Encounter: Payer: Self-pay | Admitting: Adult Health

## 2016-06-14 ENCOUNTER — Ambulatory Visit (INDEPENDENT_AMBULATORY_CARE_PROVIDER_SITE_OTHER): Payer: Medicare Other | Admitting: Adult Health

## 2016-06-14 DIAGNOSIS — R0602 Shortness of breath: Secondary | ICD-10-CM | POA: Diagnosis not present

## 2016-06-14 DIAGNOSIS — J441 Chronic obstructive pulmonary disease with (acute) exacerbation: Secondary | ICD-10-CM | POA: Diagnosis not present

## 2016-06-14 DIAGNOSIS — R05 Cough: Secondary | ICD-10-CM | POA: Diagnosis not present

## 2016-06-14 LAB — CBC WITH DIFFERENTIAL/PLATELET
Basophils Absolute: 0 10*3/uL (ref 0.0–0.1)
Basophils Relative: 0.4 % (ref 0.0–3.0)
EOS PCT: 3.1 % (ref 0.0–5.0)
Eosinophils Absolute: 0.3 10*3/uL (ref 0.0–0.7)
HCT: 40.3 % (ref 36.0–46.0)
HEMOGLOBIN: 13.2 g/dL (ref 12.0–15.0)
LYMPHS PCT: 22.7 % (ref 12.0–46.0)
Lymphs Abs: 1.9 10*3/uL (ref 0.7–4.0)
MCHC: 32.9 g/dL (ref 30.0–36.0)
MCV: 98.5 fl (ref 78.0–100.0)
MONO ABS: 0.8 10*3/uL (ref 0.1–1.0)
Monocytes Relative: 8.9 % (ref 3.0–12.0)
Neutro Abs: 5.5 10*3/uL (ref 1.4–7.7)
Neutrophils Relative %: 64.9 % (ref 43.0–77.0)
Platelets: 280 10*3/uL (ref 150.0–400.0)
RBC: 4.08 Mil/uL (ref 3.87–5.11)
RDW: 13.9 % (ref 11.5–15.5)
WBC: 8.5 10*3/uL (ref 4.0–10.5)

## 2016-06-14 MED ORDER — AMOXICILLIN-POT CLAVULANATE 875-125 MG PO TABS: 1.0000 | ORAL_TABLET | Freq: Two times a day (BID) | ORAL | 0 refills | Status: AC

## 2016-06-14 NOTE — Progress Notes (Signed)
Subjective:    Patient ID: [name], female    DOB: March 18, 1941, 75 y.o.   MRN: [id number]  HPI 75 year old female former smoker followed for COPD, chronic hypoxic respiratory failure on oxygen 2l/m   06/14/2016 Acute OV  Patient presented for an acute office visit. She complains of 3 weeks cough, congestion, , increased dyspnea, green mucus. Not much wheezing . Taking mucinex DM .  She took 2 prednisone tapers without much improvement .  Remains on  O2 at 2l/m . And  Advair and Spiriva .  She denies any hemoptysis, chest pain, orthopnea, PND, increased leg swelling, or nausea, vomiting, diarrhea.  Says she just does not feel good and has low energy  Past Medical History:  Diagnosis Date  . CAD (coronary artery disease)    Stent RCA 2005, stent LAD 2006  . Carotid artery occlusion   . COPD (chronic obstructive pulmonary disease) ([place])   . Depression   . Diabetes mellitus without complication ([place])   . Fatigue   . Heart murmur   . Hyperlipemia   . Hypertension   . Myocardial infarction ([place])   . Recurrent upper respiratory infection (URI)   . Shortness of breath   . Tobacco abuse    Current Outpatient Prescriptions on File Prior to Visit  Medication Sig Dispense Refill  . ADVAIR DISKUS 250-50 MCG/DOSE AEPB INHALE 1 PUFF EVERY 12HRS 60 each 1  . albuterol (PROVENTIL) (2.5 MG/3ML) 0.083% nebulizer solution USE 1 VIAL IN NEBULIZER EVERY 6HRS FOR WHEEZING 75 mL 5  . Albuterol Sulfate (PROAIR RESPICLICK) 123XX123 (90 BASE) MCG/ACT AEPB Inhale 2 puffs into the lungs every 6 (six) hours as needed. (Patient taking differently: Inhale 2 puffs into the lungs every 6 (six) hours as needed (shortness of breath/ wheezing). ) 1 each 11  . Ascorbic Acid (VITAMIN C) 1000 MG tablet Take 1,000 mg by mouth daily.    Marland Kitchen aspirin EC 81 MG tablet Take 81 mg by mouth at bedtime.     . Biotin (BIOTIN MAXIMUM STRENGTH) 10 MG TABS Take 10 mg by mouth daily. 10,000 mcg    . Cholecalciferol (VITAMIN D)  2000 units tablet Take 2,000 Units by mouth daily.    Marland Kitchen CINNAMON PO Take 1,000 mg by mouth 2 (two) times daily.     . clonazePAM (KLONOPIN) 1 MG tablet Take 1 mg by mouth at bedtime. Take 1 tablet (1 mg) by mouth every morning, may take an additional tablet later in the day as needed for anxiety    . guaiFENesin (MUCINEX) 600 MG 12 hr tablet Take 1 tablet (600 mg total) by mouth 2 (two) times daily.    . Melatonin 5 MG TABS Take 5 mg by mouth at bedtime.    . metFORMIN (GLUCOPHAGE-XR) 500 MG 24 hr tablet Take 1 tablet (500 mg) by mouth daily with breakfast, take 2 tablets (1000 mg) with supper  3  . Multiple Vitamin (MULTIVITAMIN WITH MINERALS) TABS tablet Take 1 tablet by mouth daily.    . nitroGLYCERIN (NITROSTAT) 0.4 MG SL tablet Place 1 tablet (0.4 mg total) under the tongue every 5 (five) minutes as needed for chest pain. 25 tablet 6  . OXYGEN Inhale into the lungs continuous. 2 1/2 - 3 L    . Red Yeast Rice 600 MG TABS Take 600 mg by mouth daily.    Marland Kitchen SPIRIVA HANDIHALER 18 MCG inhalation capsule INHALE 1 THE CONTENTS OF 1 CAPSULE DAILY 30 capsule 8  . vitamin  B-12 (CYANOCOBALAMIN) 1000 MCG tablet Take 1,000 mcg by mouth daily.     No current facility-administered medications on file prior to visit.      Review of Systems Constitutional:   No  weight loss, night sweats,  Fevers, chills,  +fatigue, or  lassitude.  HEENT:   No headaches,  Difficulty swallowing,  Tooth/dental problems, or  Sore throat,                No sneezing, itching, ear ache,  +nasal congestion, post nasal drip,   CV:  No chest pain,  Orthopnea, PND, swelling in lower extremities, anasarca, dizziness, palpitations, syncope.   GI  No heartburn, indigestion, abdominal pain, nausea, vomiting, diarrhea, change in bowel habits, loss of appetite, bloody stools.   Resp:   No chest wall deformity  Skin: no rash or lesions.  GU: no dysuria, change in color of urine, no urgency or frequency.  No flank pain, no  hematuria   MS:  No joint pain or swelling.  No decreased range of motion.  No back pain.  Psych:  No change in mood or affect. No depression or anxiety.  No memory loss.         Objective:   Physical Exam  Vitals:   06/14/16 1206  BP: 140/80  Pulse: 93  Temp: 98.1 F (36.7 C)  TempSrc: Oral  SpO2: 96%  Weight: 119 lb (54 kg)  Height: 5\' 1"  (1.549 m)    GEN: A/Ox3; pleasant, thin and elderly    HEENT:  [place]/AT,  EACs-clear, TMs-wnl, NOSE-clear, THROAT-clear, no lesions, no postnasal drip or exudate noted.   NECK:  Supple w/ fair ROM; no JVD; normal carotid impulses w/o bruits; no thyromegaly or nodules palpated; no lymphadenopathy.    RESP  Decreased BS in bases  w/o, wheezes/ rales/ or rhonchi. no accessory muscle use, no dullness to percussion  CARD:  RRR, no m/r/g  , no peripheral edema, pulses intact, no cyanosis or clubbing.  GI:   Soft & nt; nml bowel sounds; no organomegaly or masses detected.   Musco: Warm bil, no deformities or joint swelling noted.   Neuro: alert, no focal deficits noted.    Skin: Warm, no lesions or rashes  Tyrone Balash NP-C  [facility]  06/14/2016        Assessment & Plan:

## 2016-06-14 NOTE — Addendum Note (Signed)
Addended by: Osa Craver on: 06/14/2016 12:31 PM   Modules accepted: Orders

## 2016-06-14 NOTE — Assessment & Plan Note (Addendum)
Slow to resolve flare  Check cxr and cbc today   Plan  Patient Instructions  Begin Augmentin Twice daily  For 1 week .  Mucinex DM Twice daily  As needed  Cough/congestion  Chest xray and labs today .  Follow up with Dr. Annamaria Boots  As planned and As needed   Please contact office for sooner follow up if symptoms do not improve or worsen or seek emergency care

## 2016-06-14 NOTE — Patient Instructions (Addendum)
Begin Augmentin Twice daily  For 1 week .  Mucinex DM Twice daily  As needed  Cough/congestion  Chest xray and labs today .  Follow up with Dr. Annamaria Boots  As planned and As needed   Please contact office for sooner follow up if symptoms do not improve or worsen or seek emergency care

## 2016-06-17 NOTE — Progress Notes (Signed)
Called spoke with pt. Reviewed results and recs. Pt voiced understanding and had no further questions.

## 2016-06-22 ENCOUNTER — Telehealth: Payer: Self-pay | Admitting: Adult Health

## 2016-06-22 NOTE — Telephone Encounter (Signed)
lmtcb X1 for pt's daughter [name] (dpr on file)

## 2016-06-23 NOTE — Telephone Encounter (Signed)
Spoke with pt's daughter [name] (dpr on file), reviewed most recent lab results.  Nothing further needed.

## 2016-06-30 ENCOUNTER — Telehealth: Payer: Self-pay | Admitting: Internal Medicine

## 2016-06-30 NOTE — Telephone Encounter (Signed)
Opened in error

## 2016-07-01 ENCOUNTER — Ambulatory Visit: Payer: Medicare Other

## 2016-07-22 DIAGNOSIS — E119 Type 2 diabetes mellitus without complications: Secondary | ICD-10-CM | POA: Diagnosis not present

## 2016-07-22 DIAGNOSIS — E784 Other hyperlipidemia: Secondary | ICD-10-CM | POA: Diagnosis not present

## 2016-07-22 DIAGNOSIS — Z9981 Dependence on supplemental oxygen: Secondary | ICD-10-CM | POA: Diagnosis not present

## 2016-07-22 DIAGNOSIS — J449 Chronic obstructive pulmonary disease, unspecified: Secondary | ICD-10-CM | POA: Diagnosis not present

## 2016-07-26 ENCOUNTER — Other Ambulatory Visit: Payer: Self-pay | Admitting: Internal Medicine

## 2016-08-05 ENCOUNTER — Telehealth: Payer: Self-pay | Admitting: Internal Medicine

## 2016-08-05 ENCOUNTER — Other Ambulatory Visit: Payer: Self-pay | Admitting: Internal Medicine

## 2016-08-05 MED ORDER — ALBUTEROL SULFATE (2.5 MG/3ML) 0.083% IN NEBU: INHALATION_SOLUTION | RESPIRATORY_TRACT | 5 refills | Status: DC

## 2016-08-05 NOTE — Telephone Encounter (Signed)
Called and spoke with pt and she is aware of refill of albuterol that has been sent to the pharmacy.  Nothing further is needed.

## 2016-08-08 ENCOUNTER — Other Ambulatory Visit: Payer: Self-pay | Admitting: Internal Medicine

## 2016-08-19 DIAGNOSIS — R21 Rash and other nonspecific skin eruption: Secondary | ICD-10-CM | POA: Diagnosis not present

## 2016-09-06 ENCOUNTER — Encounter: Payer: Self-pay | Admitting: Internal Medicine

## 2016-09-06 ENCOUNTER — Ambulatory Visit (INDEPENDENT_AMBULATORY_CARE_PROVIDER_SITE_OTHER): Payer: Medicare Other | Admitting: Internal Medicine

## 2016-09-06 DIAGNOSIS — J9621 Acute and chronic respiratory failure with hypoxia: Secondary | ICD-10-CM | POA: Diagnosis not present

## 2016-09-06 DIAGNOSIS — B0229 Other postherpetic nervous system involvement: Secondary | ICD-10-CM | POA: Diagnosis not present

## 2016-09-06 DIAGNOSIS — J449 Chronic obstructive pulmonary disease, unspecified: Secondary | ICD-10-CM | POA: Diagnosis not present

## 2016-09-06 MED ORDER — AMITRIPTYLINE HCL 10 MG PO TABS: ORAL_TABLET | ORAL | 0 refills | Status: AC

## 2016-09-06 MED ORDER — PREDNISONE 10 MG PO TABS: ORAL_TABLET | ORAL | 0 refills | Status: DC

## 2016-09-06 NOTE — Progress Notes (Signed)
Patient ID: [name], female    DOB: May 19, 1941, 75 y.o.   MRN: [id number]  HPI Female former smoker followed for COPD, chronic hypoxic respiratory failure, complicated by CAD/stents/MI, DM End of Life Discussion-reviewed options briefly. She would accept short-term intubation if we thought a problem was reversible, such as an acute pneumonia. I suggested she share her thoughts with her family so they would understand her wishes. I think she is pretty realistic and understands she has end-stage lung disease  03/25/2016-75 year old female former smoker followed for COPD, chronic hypoxic respiratory failure, complicated by CAD/stents/MI, DM Husband here FOLLOWS FOR: Pt has been coughing more than usual; breathing has not gotten any better-sits in chair and pants for air even with O2 on. She depends on continuous oxygen. Worries about mouth breathing, thinking that means she is not getting oxygen in if she wears a nasal cannula. Aware of nasal septal deviation and some persistent nasal stuffiness but she can breathe comfortably through her nose if she deliberately closes her mouth. End of Life Discussion-reviewed options briefly. She would accept short-term intubation if we thought a problem was reversible, such as an acute pneumonia. I suggested she share her thoughts with her family so they would understand her wishes. I think she is pretty realistic and understands she has end-stage lung disease.  09/06/2016-75 year old female former smoker followed for COPD, chronic hypoxic respiratory failure, complicated by CAD/stents/MI, DM 2       Husband here Acute Visit-ACUTE VISIT: Shingles 3wk.,up and down hurting in lung area.SOB.  On Valtrex/ Gabapentin Shingles right scapula. Treated by PCP. Rash fading but still significant neuralgic pain. Not pleuritic. Her cough is increased a little and is often productive white to discolored with no blood. Remains on O2 at husband says she can desaturate  quickly into the 70% range she tries to walk without it.    ROS-see HPI   Negative unless "+" Constitutional:    weight loss, night sweats, fevers, chills, fatigue, lassitude. HEENT:    headaches, difficulty swallowing, tooth/dental problems, sore throat,       sneezing, itching, ear ache, nasal congestion, post nasal drip, snoring CV:    +chest pain, orthopnea, PND, swelling in lower extremities, anasarca,                                                             dizziness, palpitations Resp:   +shortness of breath with exertion or at rest.                productive cough,   +non-productive cough, coughing up of blood.              change in color of mucus.  wheezing.   Skin:    rash or lesions. GI:  No-   heartburn, indigestion, abdominal pain, nausea, vomiting,  GU: . MS:   joint pain, stiffness, . Neuro-     nothing unusual Psych:  change in mood or affect.  depression or anxiety.   memory loss.  OBJ- Physical Exam   O2 2-3L General- Alert, Oriented, Affect-appropriate, Distress- none acute Skin- rash-none, lesions- none, excoriation- none Lymphadenopathy- none Head- atraumatic            Eyes- Gross vision intact, PERRLA, conjunctivae and secretions clear  Ears- Hearing, canals-normal            Nose- Clear, no-Septal dev, mucus, polyps, erosion, perforation             Throat- Mallampati II , mucosa clear , drainage- none, tonsils- atrophic Neck- flexible , trachea midline, no stridor , thyroid nl, carotid no bruit Chest - symmetrical excursion , unlabored           Heart/CV- RRR , no murmur , no gallop  , no rub, nl s1 s2                           - JVD- none , edema- none, stasis changes- none, varices- none           Lung- clear to P&A /very distant, unlabored,  wheeze- none, cough + rattling , dullness-none, rub- none           Chest wall- no rub, no tenderness to light pressure Abd-  Br/ Gen/ Rectal- Not done, not indicated Extrem- cyanosis- none, clubbing,  none, atrophy- none, strength- nl Neuro- grossly intact to observation

## 2016-09-06 NOTE — Patient Instructions (Addendum)
Script sent for prednisone taper    Script sent for amitriptyline/ Elavil 1 at bedtime to help ease shingles pain  We will continue oxygen  Please call if we can help  We can keep January appointment

## 2016-09-06 NOTE — Assessment & Plan Note (Signed)
Prednisone taper today for bronchitis component. Call for antibiotic if needed as discussed. Continue routine meds.

## 2016-09-06 NOTE — Assessment & Plan Note (Signed)
Being treated appropriately by primary care office. Pain is significant. Plan-given prednisone taper which may help with inflammatory pain while also addressing her chronic bronchitis status. Add amitriptyline 10 mg at bedtime while needed for pain.

## 2016-09-06 NOTE — Assessment & Plan Note (Signed)
Remains O2 dependent. Mild exacerbation of bronchitis. Plan-prednisone taper

## 2016-09-19 DIAGNOSIS — I10 Essential (primary) hypertension: Secondary | ICD-10-CM | POA: Diagnosis not present

## 2016-09-19 DIAGNOSIS — Z1231 Encounter for screening mammogram for malignant neoplasm of breast: Secondary | ICD-10-CM | POA: Diagnosis not present

## 2016-09-19 DIAGNOSIS — E119 Type 2 diabetes mellitus without complications: Secondary | ICD-10-CM | POA: Diagnosis not present

## 2016-09-19 DIAGNOSIS — Z0000 Encounter for general adult medical examination without abnormal findings: Secondary | ICD-10-CM | POA: Diagnosis not present

## 2016-09-19 DIAGNOSIS — J449 Chronic obstructive pulmonary disease, unspecified: Secondary | ICD-10-CM | POA: Diagnosis not present

## 2016-09-20 DIAGNOSIS — Z1231 Encounter for screening mammogram for malignant neoplasm of breast: Secondary | ICD-10-CM | POA: Diagnosis not present

## 2016-09-20 DIAGNOSIS — Z0000 Encounter for general adult medical examination without abnormal findings: Secondary | ICD-10-CM | POA: Diagnosis not present

## 2016-09-20 DIAGNOSIS — I10 Essential (primary) hypertension: Secondary | ICD-10-CM | POA: Diagnosis not present

## 2016-09-23 ENCOUNTER — Telehealth: Payer: Self-pay | Admitting: Internal Medicine

## 2016-09-23 MED ORDER — PREDNISONE 10 MG PO TABS: ORAL_TABLET | ORAL | 0 refills | Status: DC

## 2016-09-23 NOTE — Telephone Encounter (Signed)
Prednisone 10 mg take  4 each am x 2 days,   2 each am x 2 days,  1 each am x 2 days and stop   Go to ER at any point getting worse

## 2016-09-23 NOTE — Telephone Encounter (Signed)
Called and spoke with pts daughter and she stated that the pt has been doing good until the last couple of days.  She stated that the pt is on 2 liters. She will start out at 93% and once she gets up to the [place] she will drop down to 70%.  They have doubled up on her albuterol txs today and the daughter is requesting a taper of prednisone to be sent in.  The daughter stated that if the pt does not get better, they will take her to the ER.  CY is out of the office today, MW please advise. Thanks  Allergies  Allergen Reactions  . Bactrim [Sulfamethoxazole-Trimethoprim] Nausea And Vomiting  . Codeine Other (See Comments)    Unknown reaction but cannot take per husband  . Vytorin [Ezetimibe-Simvastatin] Other (See Comments)    Reaction: possibly malaise per husband

## 2016-09-23 NOTE — Telephone Encounter (Signed)
Called and spoke with pts daughter [name] and she is aware of rx for the prednisone that has been sent to her pharmacy.  Nothing further is needed.

## 2016-09-30 DIAGNOSIS — H04123 Dry eye syndrome of bilateral lacrimal glands: Secondary | ICD-10-CM | POA: Diagnosis not present

## 2016-09-30 DIAGNOSIS — H2513 Age-related nuclear cataract, bilateral: Secondary | ICD-10-CM | POA: Diagnosis not present

## 2016-09-30 DIAGNOSIS — E119 Type 2 diabetes mellitus without complications: Secondary | ICD-10-CM | POA: Diagnosis not present

## 2016-10-07 ENCOUNTER — Ambulatory Visit: Payer: Medicare Other | Admitting: Internal Medicine

## 2016-10-20 ENCOUNTER — Ambulatory Visit: Payer: Medicare Other | Admitting: Internal Medicine

## 2016-11-14 ENCOUNTER — Telehealth: Payer: Self-pay | Admitting: Internal Medicine

## 2016-11-14 NOTE — Telephone Encounter (Signed)
I spoke with pt. And informed her that I do see the papers are here and we will follow up with her once we are aware CY has completed these forms. They have placed forms in [name]'s look at (black paper holder) on her desk.   Will cc Cherina just in case needed in [name]'s absence.

## 2016-11-16 NOTE — Telephone Encounter (Signed)
This form is not located on [employer] or CY's cart.  CY - do you know if you have seen or completed this form?

## 2016-11-16 NOTE — Telephone Encounter (Signed)
Forms completed by CY and faxed to [facility] at [phone]. Copy made for our records and originials mailed to patients home address (confirmed). Nothing more needed at this time.

## 2016-11-16 NOTE — Telephone Encounter (Signed)
Pt is aware as well.  

## 2016-11-24 ENCOUNTER — Emergency Department (HOSPITAL_COMMUNITY): Payer: Medicare Other

## 2016-11-24 ENCOUNTER — Encounter (HOSPITAL_COMMUNITY): Payer: Self-pay

## 2016-11-24 ENCOUNTER — Inpatient Hospital Stay (HOSPITAL_COMMUNITY)
Admission: EM | Admit: 2016-11-24 | Discharge: 2016-11-30 | DRG: 189 | Disposition: A | Discharge status: Home / Self Care | Payer: Medicare Other | Attending: Internal Medicine | Admitting: Internal Medicine

## 2016-11-24 DIAGNOSIS — Z9981 Dependence on supplemental oxygen: Secondary | ICD-10-CM

## 2016-11-24 DIAGNOSIS — I5033 Acute on chronic diastolic (congestive) heart failure: Secondary | ICD-10-CM | POA: Diagnosis present

## 2016-11-24 DIAGNOSIS — J9691 Respiratory failure, unspecified with hypoxia: Secondary | ICD-10-CM | POA: Diagnosis present

## 2016-11-24 DIAGNOSIS — Z515 Encounter for palliative care: Secondary | ICD-10-CM

## 2016-11-24 DIAGNOSIS — J9621 Acute and chronic respiratory failure with hypoxia: Secondary | ICD-10-CM | POA: Diagnosis not present

## 2016-11-24 DIAGNOSIS — J441 Chronic obstructive pulmonary disease with (acute) exacerbation: Secondary | ICD-10-CM | POA: Diagnosis not present

## 2016-11-24 DIAGNOSIS — Z7982 Long term (current) use of aspirin: Secondary | ICD-10-CM

## 2016-11-24 DIAGNOSIS — I10 Essential (primary) hypertension: Secondary | ICD-10-CM | POA: Diagnosis present

## 2016-11-24 DIAGNOSIS — J209 Acute bronchitis, unspecified: Secondary | ICD-10-CM

## 2016-11-24 DIAGNOSIS — J9622 Acute and chronic respiratory failure with hypercapnia: Secondary | ICD-10-CM | POA: Diagnosis present

## 2016-11-24 DIAGNOSIS — J9602 Acute respiratory failure with hypercapnia: Secondary | ICD-10-CM | POA: Diagnosis not present

## 2016-11-24 DIAGNOSIS — J9601 Acute respiratory failure with hypoxia: Secondary | ICD-10-CM | POA: Diagnosis present

## 2016-11-24 DIAGNOSIS — F329 Major depressive disorder, single episode, unspecified: Secondary | ICD-10-CM | POA: Diagnosis present

## 2016-11-24 DIAGNOSIS — I5031 Acute diastolic (congestive) heart failure: Secondary | ICD-10-CM | POA: Diagnosis not present

## 2016-11-24 DIAGNOSIS — I110 Hypertensive heart disease with heart failure: Secondary | ICD-10-CM | POA: Diagnosis not present

## 2016-11-24 DIAGNOSIS — I252 Old myocardial infarction: Secondary | ICD-10-CM | POA: Diagnosis not present

## 2016-11-24 DIAGNOSIS — Z8249 Family history of ischemic heart disease and other diseases of the circulatory system: Secondary | ICD-10-CM

## 2016-11-24 DIAGNOSIS — J9620 Acute and chronic respiratory failure, unspecified whether with hypoxia or hypercapnia: Secondary | ICD-10-CM | POA: Diagnosis present

## 2016-11-24 DIAGNOSIS — Z7984 Long term (current) use of oral hypoglycemic drugs: Secondary | ICD-10-CM | POA: Diagnosis not present

## 2016-11-24 DIAGNOSIS — J9692 Respiratory failure, unspecified with hypercapnia: Secondary | ICD-10-CM

## 2016-11-24 DIAGNOSIS — Z7189 Other specified counseling: Secondary | ICD-10-CM | POA: Diagnosis not present

## 2016-11-24 DIAGNOSIS — Z7951 Long term (current) use of inhaled steroids: Secondary | ICD-10-CM | POA: Diagnosis not present

## 2016-11-24 DIAGNOSIS — E119 Type 2 diabetes mellitus without complications: Secondary | ICD-10-CM

## 2016-11-24 DIAGNOSIS — R0602 Shortness of breath: Secondary | ICD-10-CM

## 2016-11-24 DIAGNOSIS — Z833 Family history of diabetes mellitus: Secondary | ICD-10-CM

## 2016-11-24 DIAGNOSIS — R0600 Dyspnea, unspecified: Secondary | ICD-10-CM | POA: Diagnosis not present

## 2016-11-24 DIAGNOSIS — I2510 Atherosclerotic heart disease of native coronary artery without angina pectoris: Secondary | ICD-10-CM | POA: Diagnosis present

## 2016-11-24 DIAGNOSIS — F17201 Nicotine dependence, unspecified, in remission: Secondary | ICD-10-CM | POA: Diagnosis present

## 2016-11-24 DIAGNOSIS — R000 Tachycardia, unspecified: Secondary | ICD-10-CM | POA: Diagnosis present

## 2016-11-24 DIAGNOSIS — F419 Anxiety disorder, unspecified: Secondary | ICD-10-CM | POA: Diagnosis present

## 2016-11-24 DIAGNOSIS — Z885 Allergy status to narcotic agent status: Secondary | ICD-10-CM

## 2016-11-24 DIAGNOSIS — K219 Gastro-esophageal reflux disease without esophagitis: Secondary | ICD-10-CM | POA: Diagnosis present

## 2016-11-24 DIAGNOSIS — Z87891 Personal history of nicotine dependence: Secondary | ICD-10-CM | POA: Diagnosis not present

## 2016-11-24 DIAGNOSIS — R0902 Hypoxemia: Secondary | ICD-10-CM | POA: Diagnosis not present

## 2016-11-24 DIAGNOSIS — Z955 Presence of coronary angioplasty implant and graft: Secondary | ICD-10-CM

## 2016-11-24 DIAGNOSIS — I4581 Long QT syndrome: Secondary | ICD-10-CM | POA: Diagnosis not present

## 2016-11-24 DIAGNOSIS — T380X5A Adverse effect of glucocorticoids and synthetic analogues, initial encounter: Secondary | ICD-10-CM | POA: Diagnosis present

## 2016-11-24 DIAGNOSIS — D72829 Elevated white blood cell count, unspecified: Secondary | ICD-10-CM | POA: Diagnosis not present

## 2016-11-24 DIAGNOSIS — J449 Chronic obstructive pulmonary disease, unspecified: Secondary | ICD-10-CM | POA: Diagnosis not present

## 2016-11-24 DIAGNOSIS — J440 Chronic obstructive pulmonary disease with acute lower respiratory infection: Secondary | ICD-10-CM | POA: Diagnosis present

## 2016-11-24 DIAGNOSIS — F32A Depression, unspecified: Secondary | ICD-10-CM

## 2016-11-24 DIAGNOSIS — R0682 Tachypnea, not elsewhere classified: Secondary | ICD-10-CM | POA: Diagnosis not present

## 2016-11-24 DIAGNOSIS — Z888 Allergy status to other drugs, medicaments and biological substances status: Secondary | ICD-10-CM

## 2016-11-24 LAB — BASIC METABOLIC PANEL
Anion gap: 13 (ref 5–15)
BUN: 11 mg/dL (ref 6–20)
CO2: 33 mmol/L — ABNORMAL HIGH (ref 22–32)
CREATININE: 0.63 mg/dL (ref 0.44–1.00)
Calcium: 9.5 mg/dL (ref 8.9–10.3)
Chloride: 96 mmol/L — ABNORMAL LOW (ref 101–111)
Glucose, Bld: 232 mg/dL — ABNORMAL HIGH (ref 65–99)
POTASSIUM: 4.3 mmol/L (ref 3.5–5.1)
SODIUM: 142 mmol/L (ref 135–145)

## 2016-11-24 LAB — CBC WITH DIFFERENTIAL/PLATELET
BASOS PCT: 1 %
Basophils Absolute: 0.1 10*3/uL (ref 0.0–0.1)
EOS ABS: 0.2 10*3/uL (ref 0.0–0.7)
EOS PCT: 2 %
HCT: 41.2 % (ref 36.0–46.0)
HEMOGLOBIN: 12.9 g/dL (ref 12.0–15.0)
LYMPHS ABS: 3.2 10*3/uL (ref 0.7–4.0)
Lymphocytes Relative: 28 %
MCH: 32.4 pg (ref 26.0–34.0)
MCHC: 31.3 g/dL (ref 30.0–36.0)
MCV: 103.5 fL — ABNORMAL HIGH (ref 78.0–100.0)
Monocytes Absolute: 0.7 10*3/uL (ref 0.1–1.0)
Monocytes Relative: 6 %
NEUTROS PCT: 63 %
Neutro Abs: 7.4 10*3/uL (ref 1.7–7.7)
PLATELETS: 232 10*3/uL (ref 150–400)
RBC: 3.98 MIL/uL (ref 3.87–5.11)
RDW: 13.4 % (ref 11.5–15.5)
WBC: 11.6 10*3/uL — AB (ref 4.0–10.5)

## 2016-11-24 LAB — I-STAT ARTERIAL BLOOD GAS, ED
ACID-BASE EXCESS: 6 mmol/L — AB (ref 0.0–2.0)
BICARBONATE: 35.2 mmol/L — AB (ref 20.0–28.0)
O2 Saturation: 95 %
PH ART: 7.281 — AB (ref 7.350–7.450)
TCO2: 37 mmol/L (ref 0–100)
pCO2 arterial: 74.9 mmHg (ref 32.0–48.0)
pO2, Arterial: 90 mmHg (ref 83.0–108.0)

## 2016-11-24 LAB — I-STAT TROPONIN, ED: Troponin i, poc: 0.07 ng/mL (ref 0.00–0.08)

## 2016-11-24 LAB — CBG MONITORING, ED: Glucose-Capillary: 297 mg/dL — ABNORMAL HIGH (ref 65–99)

## 2016-11-24 LAB — BRAIN NATRIURETIC PEPTIDE: B NATRIURETIC PEPTIDE 5: 46.4 pg/mL (ref 0.0–100.0)

## 2016-11-24 LAB — GLUCOSE, CAPILLARY
GLUCOSE-CAPILLARY: 75 mg/dL (ref 65–99)
GLUCOSE-CAPILLARY: 241 mg/dL — AB (ref 65–99)

## 2016-11-24 LAB — MAGNESIUM: MAGNESIUM: 2.9 mg/dL — AB (ref 1.7–2.4)

## 2016-11-24 LAB — MRSA PCR SCREENING: MRSA BY PCR: NEGATIVE

## 2016-11-24 MED ORDER — METHYLPREDNISOLONE SODIUM SUCC 125 MG IJ SOLR
125.0000 mg | Freq: Three times a day (TID) | INTRAMUSCULAR | Status: DC
  Administered 2016-11-24 – 2016-11-25 (×4): 125 mg via INTRAVENOUS
  Filled 2016-11-24 (×4): qty 2

## 2016-11-24 MED ORDER — IPRATROPIUM BROMIDE 0.02 % IN SOLN: 0.5000 mg | RESPIRATORY_TRACT | Status: DC | PRN

## 2016-11-24 MED ORDER — LORATADINE 10 MG PO TABS
10.0000 mg | ORAL_TABLET | Freq: Every day | ORAL | Status: DC
  Administered 2016-11-24 – 2016-11-30 (×7): 10 mg via ORAL
  Filled 2016-11-24 (×7): qty 1

## 2016-11-24 MED ORDER — LEVALBUTEROL HCL 0.63 MG/3ML IN NEBU: 0.6300 mg | INHALATION_SOLUTION | RESPIRATORY_TRACT | Status: DC | PRN

## 2016-11-24 MED ORDER — IPRATROPIUM BROMIDE 0.02 % IN SOLN
0.5000 mg | Freq: Four times a day (QID) | RESPIRATORY_TRACT | Status: DC
  Administered 2016-11-24 (×2): 0.5 mg via RESPIRATORY_TRACT
  Filled 2016-11-24 (×2): qty 2.5

## 2016-11-24 MED ORDER — VALACYCLOVIR HCL 500 MG PO TABS
500.0000 mg | ORAL_TABLET | Freq: Two times a day (BID) | ORAL | Status: DC
  Administered 2016-11-24: 500 mg via ORAL
  Filled 2016-11-24 (×3): qty 1

## 2016-11-24 MED ORDER — SODIUM CHLORIDE 0.9% FLUSH
3.0000 mL | Freq: Two times a day (BID) | INTRAVENOUS | Status: DC
  Administered 2016-11-24 – 2016-11-30 (×11): 3 mL via INTRAVENOUS

## 2016-11-24 MED ORDER — INSULIN GLARGINE 100 UNIT/ML ~~LOC~~ SOLN
5.0000 [IU] | Freq: Every day | SUBCUTANEOUS | Status: DC
  Administered 2016-11-24 – 2016-11-26 (×3): 5 [IU] via SUBCUTANEOUS
  Filled 2016-11-24 (×3): qty 0.05

## 2016-11-24 MED ORDER — FLUTICASONE FUROATE-VILANTEROL 200-25 MCG/INH IN AEPB: 1.0000 | INHALATION_SPRAY | Freq: Every day | RESPIRATORY_TRACT | Status: DC

## 2016-11-24 MED ORDER — INSULIN ASPART 100 UNIT/ML ~~LOC~~ SOLN: 0.0000 [IU] | SUBCUTANEOUS | Status: DC

## 2016-11-24 MED ORDER — ADULT MULTIVITAMIN W/MINERALS CH
1.0000 | ORAL_TABLET | Freq: Every day | ORAL | Status: DC
  Administered 2016-11-24 – 2016-11-30 (×7): 1 via ORAL
  Filled 2016-11-24 (×7): qty 1

## 2016-11-24 MED ORDER — PANTOPRAZOLE SODIUM 40 MG PO TBEC
40.0000 mg | DELAYED_RELEASE_TABLET | Freq: Every day | ORAL | Status: DC
  Administered 2016-11-24 – 2016-11-27 (×3): 40 mg via ORAL
  Filled 2016-11-24 (×4): qty 1

## 2016-11-24 MED ORDER — INSULIN ASPART 100 UNIT/ML ~~LOC~~ SOLN
0.0000 [IU] | Freq: Three times a day (TID) | SUBCUTANEOUS | Status: DC
  Administered 2016-11-24: 11 [IU] via SUBCUTANEOUS
  Administered 2016-11-25: 4 [IU] via SUBCUTANEOUS
  Administered 2016-11-25: 11 [IU] via SUBCUTANEOUS
  Administered 2016-11-25 – 2016-11-26 (×3): 4 [IU] via SUBCUTANEOUS
  Administered 2016-11-26: 15 [IU] via SUBCUTANEOUS
  Administered 2016-11-27: 4 [IU] via SUBCUTANEOUS
  Administered 2016-11-27: 11 [IU] via SUBCUTANEOUS
  Administered 2016-11-27: 3 [IU] via SUBCUTANEOUS
  Administered 2016-11-28: 4 [IU] via SUBCUTANEOUS
  Administered 2016-11-28: 7 [IU] via SUBCUTANEOUS
  Administered 2016-11-28 – 2016-11-29 (×3): 4 [IU] via SUBCUTANEOUS
  Administered 2016-11-29: 7 [IU] via SUBCUTANEOUS
  Administered 2016-11-30: 3 [IU] via SUBCUTANEOUS
  Administered 2016-11-30: 4 [IU] via SUBCUTANEOUS
  Filled 2016-11-24: qty 1

## 2016-11-24 MED ORDER — BUDESONIDE 0.25 MG/2ML IN SUSP
0.2500 mg | Freq: Two times a day (BID) | RESPIRATORY_TRACT | Status: DC
  Administered 2016-11-24 – 2016-11-26 (×5): 0.25 mg via RESPIRATORY_TRACT
  Filled 2016-11-24 (×5): qty 2

## 2016-11-24 MED ORDER — ORAL CARE MOUTH RINSE
15.0000 mL | Freq: Two times a day (BID) | OROMUCOSAL | Status: DC
  Administered 2016-11-25 – 2016-11-29 (×6): 15 mL via OROMUCOSAL

## 2016-11-24 MED ORDER — LEVALBUTEROL HCL 0.63 MG/3ML IN NEBU
0.6300 mg | INHALATION_SOLUTION | Freq: Four times a day (QID) | RESPIRATORY_TRACT | Status: DC
  Administered 2016-11-24 (×2): 0.63 mg via RESPIRATORY_TRACT
  Filled 2016-11-24 (×2): qty 3

## 2016-11-24 MED ORDER — ENOXAPARIN SODIUM 40 MG/0.4ML ~~LOC~~ SOLN
40.0000 mg | SUBCUTANEOUS | Status: DC
  Administered 2016-11-24 – 2016-11-29 (×6): 40 mg via SUBCUTANEOUS
  Filled 2016-11-24 (×6): qty 0.4

## 2016-11-24 MED ORDER — DEXTROSE 5 % IV SOLN
500.0000 mg | INTRAVENOUS | Status: DC
  Administered 2016-11-24 – 2016-11-27 (×4): 500 mg via INTRAVENOUS
  Filled 2016-11-24 (×5): qty 500

## 2016-11-24 MED ORDER — TIOTROPIUM BROMIDE MONOHYDRATE 18 MCG IN CAPS: 18.0000 ug | ORAL_CAPSULE | Freq: Every day | RESPIRATORY_TRACT | Status: DC

## 2016-11-24 MED ORDER — WHITE PETROLATUM GEL
Status: AC
  Administered 2016-11-24: 23:00:00
  Filled 2016-11-24: qty 1

## 2016-11-24 MED ORDER — PREDNISONE 20 MG PO TABS: 40.0000 mg | ORAL_TABLET | Freq: Every day | ORAL | Status: DC

## 2016-11-24 MED ORDER — ALBUTEROL SULFATE (2.5 MG/3ML) 0.083% IN NEBU: 2.5000 mg | INHALATION_SOLUTION | RESPIRATORY_TRACT | Status: DC | PRN

## 2016-11-24 MED ORDER — GABAPENTIN 100 MG PO CAPS: 100.0000 mg | ORAL_CAPSULE | Freq: Three times a day (TID) | ORAL | Status: DC | PRN

## 2016-11-24 MED ORDER — ARFORMOTEROL TARTRATE 15 MCG/2ML IN NEBU
15.0000 ug | INHALATION_SOLUTION | Freq: Two times a day (BID) | RESPIRATORY_TRACT | Status: DC
  Administered 2016-11-24 – 2016-11-30 (×12): 15 ug via RESPIRATORY_TRACT
  Filled 2016-11-24 (×13): qty 2

## 2016-11-24 MED ORDER — FLUTICASONE PROPIONATE 50 MCG/ACT NA SUSP
2.0000 | Freq: Every day | NASAL | Status: DC
  Administered 2016-11-24 – 2016-11-30 (×7): 2 via NASAL
  Filled 2016-11-24: qty 16

## 2016-11-24 MED ORDER — CLONAZEPAM 0.5 MG PO TABS
1.0000 mg | ORAL_TABLET | Freq: Every day | ORAL | Status: DC
Start: 2016-11-24 — End: 2016-11-30
  Administered 2016-11-24 – 2016-11-29 (×6): 1 mg via ORAL
  Filled 2016-11-24 (×3): qty 2
  Filled 2016-11-24: qty 1
  Filled 2016-11-24 (×2): qty 2

## 2016-11-24 MED ORDER — ASPIRIN EC 81 MG PO TBEC
81.0000 mg | DELAYED_RELEASE_TABLET | Freq: Every day | ORAL | Status: DC
  Administered 2016-11-24 – 2016-11-29 (×6): 81 mg via ORAL
  Filled 2016-11-24 (×6): qty 1

## 2016-11-24 MED ORDER — GUAIFENESIN ER 600 MG PO TB12
600.0000 mg | ORAL_TABLET | Freq: Two times a day (BID) | ORAL | Status: DC
  Administered 2016-11-24 – 2016-11-30 (×13): 600 mg via ORAL
  Filled 2016-11-24 (×13): qty 1

## 2016-11-24 MED ORDER — AMITRIPTYLINE HCL 10 MG PO TABS
10.0000 mg | ORAL_TABLET | Freq: Every day | ORAL | Status: DC
Start: 2016-11-24 — End: 2016-11-30
  Administered 2016-11-24 – 2016-11-29 (×6): 10 mg via ORAL
  Filled 2016-11-24 (×7): qty 1

## 2016-11-24 NOTE — Care Management Note (Signed)
Case Management Note  Patient Details  Name: [name] MRN: [id number] Date of Birth: May 28, 1941  Subjective/Objective:                  From home with spouse. /75 yo patient presents to the emergency department for respiratory distress. She has a history of COPD. On 2L Oxygen at home.  Action/Plan: Admit status INPATIENT (COPD with acute exacerbation); anticipate discharge [place].   Expected Discharge Date:   (unsure)               Expected Discharge Plan:  [place]  In-House Referral:     Discharge planning Services  CM Consult  Post Acute Care Choice:    Choice offered to:     DME Arranged:    DME Agency:     HH Arranged:    HH Agency:     Status of Service:  In process, will continue to follow  If discussed at Long Length of Stay Meetings, dates discussed:    Additional Comments:  Fuller Mandril, RN 11/24/2016, 9:33 AM

## 2016-11-24 NOTE — ED Notes (Signed)
Pt. Assisted with bed pan and got very short of breath lifting her hips to get her pants off. Pt. Oxygen saturation remains 94-97% on 2L.

## 2016-11-24 NOTE — Progress Notes (Signed)
Dentures taken out at this time and placed in denture cup with pt label. Pt then placed on BiPAP and is tolerating well. RT to continue to monitor as needed

## 2016-11-24 NOTE — ED Notes (Addendum)
No response from admitting MD. Paged again. Admitting MD recommending patient stay stepdown for initial 24 hours.

## 2016-11-24 NOTE — H&P (Signed)
History and Physical    [name] [id number] DOB: 1941/09/21 DOA: 11/24/2016   PCP: Maximino Greenland, MD Chief Complaint:  Chief Complaint  Patient presents with  . Respiratory Distress    HPI: [name] is a 76 y.o. female with medical history significant of COPD.  Patient is on 2L O2 at home at baseline.  Patient last admitted in Jan of 2017, required intubation during that admission.  Patient has had numerous recent exacerbations, including what looks like 2 courses of steroid tapers in Dec.  Patient presents to the ED today with c/o severe SOB.  Symptoms are constant, gradually worsening.  Symptoms onset yesterday AM.  Tried increasing O2 at home to 2.5L for resp distress, home nebs gave no significant relief.  EMS called for respiratory distress.  ED Course: Initially patient with retractions and tachycardia to the 130s.  ABG demonstrates acute on chronic hypercapnic failure.  Patient got 125 solumedrol with EMS, multiple breathing treatments.  Patient ultimately placed on BIPAP which provided significant relief.  Patient's breathing is significantly improved and WOB reduced but she remains on BIPAP.   Review of Systems: As per HPI otherwise 10 point review of systems negative.    Past Medical History:  Diagnosis Date  . CAD (coronary artery disease)    Stent RCA 2005, stent LAD 2006  . Carotid artery occlusion   . COPD (chronic obstructive pulmonary disease) ([place])   . Depression   . Diabetes mellitus without complication ([place])   . Fatigue   . Heart murmur   . Hyperlipemia   . Hypertension   . Myocardial infarction   . Recurrent upper respiratory infection (URI)   . Shortness of breath   . Tobacco abuse     Past Surgical History:  Procedure Laterality Date  . CARDIAC CATHETERIZATION  2011  . coronary angiography  Nov 25, 2004   CYPHER stenting, left anterior descending  . CORONARY STENT PLACEMENT     drug-eluting; right coronary artery  .  VESICOVAGINAL FISTULA CLOSURE W/ TAH       reports that she quit smoking about 4 years ago. Her smoking use included Cigarettes. She has a 12.50 pack-year smoking history. She has never used smokeless tobacco. She reports that she drinks alcohol. She reports that she does not use drugs.  Allergies  Allergen Reactions  . Bactrim [Sulfamethoxazole-Trimethoprim] Nausea And Vomiting  . Codeine Other (See Comments)    Unknown reaction but cannot take per husband  . Vytorin [Ezetimibe-Simvastatin] Other (See Comments)    Reaction: possibly malaise per husband    Family History  Problem Relation Age of Onset  . Diabetes Mother     deceased  . Heart disease Mother   . Alzheimer's disease Father     deceased  . Diabetes Sister   . Heart attack Sister       Prior to Admission medications   Medication Sig Start Date End Date Taking? Authorizing Provider  ADVAIR DISKUS 250-50 MCG/DOSE AEPB INHALE 1 PUFF EVERY 12HRS 02/25/16   Deneise Lever, MD  albuterol (PROVENTIL) (2.5 MG/3ML) 0.083% nebulizer solution USE 1 VIAL IN NEBULIZER EVERY 6HRS FOR WHEEZING 08/11/16   Deneise Lever, MD  Albuterol Sulfate (PROAIR RESPICLICK) [id number] (90 BASE) MCG/ACT AEPB Inhale 2 puffs into the lungs every 6 (six) hours as needed. Patient taking differently: Inhale 2 puffs into the lungs every 6 (six) hours as needed (shortness of breath/ wheezing).  12/19/14   Deneise Lever, MD  amitriptyline (ELAVIL) 10 MG tablet 1 daily at bedtime 09/06/16   Deneise Lever, MD  Ascorbic Acid (VITAMIN C) 1000 MG tablet Take 1,000 mg by mouth daily.    Historical Provider, MD  aspirin EC 81 MG tablet Take 81 mg by mouth at bedtime.     Historical Provider, MD  Biotin (BIOTIN MAXIMUM STRENGTH) 10 MG TABS Take 10 mg by mouth daily. 10,000 mcg    Historical Provider, MD  Cholecalciferol (VITAMIN D) 2000 units tablet Take 2,000 Units by mouth daily.    Historical Provider, MD  CINNAMON PO Take 1,000 mg by mouth 2 (two) times daily.      Historical Provider, MD  clonazePAM (KLONOPIN) 1 MG tablet Take 1 mg by mouth at bedtime. Take 1 tablet (1 mg) by mouth every morning, may take an additional tablet later in the day as needed for anxiety    Historical Provider, MD  gabapentin (NEURONTIN) 100 MG capsule Take 1 capsule by mouth 3 (three) times daily as needed. 09/02/16   Historical Provider, MD  guaiFENesin (MUCINEX) 600 MG 12 hr tablet Take 1 tablet (600 mg total) by mouth 2 (two) times daily. 10/19/15   Erick Colace, NP  Melatonin 5 MG TABS Take 5 mg by mouth at bedtime.    Historical Provider, MD  metFORMIN (GLUCOPHAGE-XR) 500 MG 24 hr tablet Take 1 tablet (500 mg) by mouth daily with breakfast, take 2 tablets (1000 mg) with supper    Historical Provider, MD  Multiple Vitamin (MULTIVITAMIN WITH MINERALS) TABS tablet Take 1 tablet by mouth daily.    Historical Provider, MD  nitroGLYCERIN (NITROSTAT) 0.4 MG SL tablet Place 1 tablet (0.4 mg total) under the tongue every 5 (five) minutes as needed for chest pain. 04/09/15   Burnell Blanks, MD  OXYGEN Inhale into the lungs continuous. 2 1/2 - 3 L    Historical Provider, MD  Red Yeast Rice 600 MG TABS Take 600 mg by mouth daily.    Historical Provider, MD  SPIRIVA HANDIHALER 18 MCG inhalation capsule INHALE 1 CAPSULE DAILY 07/27/16   Deneise Lever, MD  valACYclovir (VALTREX) 500 MG tablet Take 1 tablet by mouth 2 (two) times daily. 08/29/16   Historical Provider, MD  vitamin B-12 (CYANOCOBALAMIN) 1000 MCG tablet Take 1,000 mcg by mouth daily.    Historical Provider, MD    Physical Exam: Vitals:   11/24/16 0330 11/24/16 0345 11/24/16 0400 11/24/16 0415  BP: (!) 129/51 129/63 129/67 110/59  Pulse: (!) 131 (!) 126 (!) 127 107  Resp: 16 21 17 21   Temp:      TempSrc:      SpO2: 99% 100% 99% 99%      Constitutional: NAD, calm, comfortable Eyes: PERRL, lids and conjunctivae normal ENMT: Mucous membranes are moist. Posterior pharynx clear of any exudate or lesions.Normal  dentition.  Neck: normal, supple, no masses, no thyromegaly Respiratory: clear to auscultation bilaterally, no wheezing, no crackles. Normal respiratory effort. No accessory muscle use.  Cardiovascular: Regular rate and rhythm, no murmurs / rubs / gallops. No extremity edema. 2+ pedal pulses. No carotid bruits.  Abdomen: no tenderness, no masses palpated. No hepatosplenomegaly. Bowel sounds positive.  Musculoskeletal: no clubbing / cyanosis. No joint deformity upper and lower extremities. Good ROM, no contractures. Normal muscle tone.  Skin: no rashes, lesions, ulcers. No induration Neurologic: CN 2-12 grossly intact. Sensation intact, DTR normal. Strength 5/5 in all 4.  Psychiatric: Normal judgment and insight. Alert and oriented x 3.  Normal mood.    Labs on Admission: I have personally reviewed following labs and imaging studies  CBC:  Recent Labs Lab 11/24/16 0301  WBC 11.6*  NEUTROABS 7.4  HGB 12.9  HCT 41.2  MCV 103.5*  PLT [id number]   Basic Metabolic Panel:  Recent Labs Lab 11/24/16 0301  NA 142  K 4.3  CL 96*  CO2 33*  GLUCOSE 232*  BUN 11  CREATININE 0.63  CALCIUM 9.5   GFR: CrCl cannot be calculated (Unknown ideal weight.). Liver Function Tests: No results for input(s): AST, ALT, ALKPHOS, BILITOT, PROT, ALBUMIN in the last 168 hours. No results for input(s): LIPASE, AMYLASE in the last 168 hours. No results for input(s): AMMONIA in the last 168 hours. Coagulation Profile: No results for input(s): INR, PROTIME in the last 168 hours. Cardiac Enzymes: No results for input(s): CKTOTAL, CKMB, CKMBINDEX, TROPONINI in the last 168 hours. BNP (last 3 results) No results for input(s): PROBNP in the last 8760 hours. HbA1C: No results for input(s): HGBA1C in the last 72 hours. CBG: No results for input(s): GLUCAP in the last 168 hours. Lipid Profile: No results for input(s): CHOL, HDL, LDLCALC, TRIG, CHOLHDL, LDLDIRECT in the last 72 hours. Thyroid Function Tests: No  results for input(s): TSH, T4TOTAL, FREET4, T3FREE, THYROIDAB in the last 72 hours. Anemia Panel: No results for input(s): VITAMINB12, FOLATE, FERRITIN, TIBC, IRON, RETICCTPCT in the last 72 hours. Urine analysis:    Component Value Date/Time   COLORURINE YELLOW 10/12/2015 1018   APPEARANCEUR CLOUDY (A) 10/12/2015 1018   LABSPEC 1.033 (H) 10/12/2015 1018   PHURINE 5.5 10/12/2015 1018   GLUCOSEU >1000 (A) 10/12/2015 1018   HGBUR NEGATIVE 10/12/2015 1018   BILIRUBINUR NEGATIVE 10/12/2015 1018   KETONESUR 40 (A) 10/12/2015 1018   PROTEINUR NEGATIVE 10/12/2015 1018   UROBILINOGEN 0.2 12/26/2010 0926   NITRITE NEGATIVE 10/12/2015 1018   LEUKOCYTESUR NEGATIVE 10/12/2015 1018   Sepsis Labs: @LABRCNTIP (procalcitonin:4,lacticidven:4) )No results found for this or any previous visit (from the past 240 hour(s)).   Radiological Exams on Admission: Dg Chest Port 1 View  Result Date: 11/24/2016 CLINICAL DATA:  Shortness of breath EXAM: PORTABLE CHEST 1 VIEW COMPARISON:  06/14/2016 FINDINGS: Hyperinflation with emphysematous disease. No focal consolidation or effusion. Coarse interstitial opacities at the lung bases likely chronic. Normal heart size. No pneumothorax. IMPRESSION: Hyperinflation with emphysematous disease. No acute pulmonary infiltrate Electronically Signed   By: Donavan Foil M.D.   On: 11/24/2016 03:28    EKG: Independently reviewed.  Assessment/Plan Principal Problem:   COPD with acute exacerbation (HCC) Active Problems:   Acute on chronic respiratory failure (HCC)   Acute respiratory failure with hypoxia and hypercapnia (HCC)   DM2 (diabetes mellitus, type 2) ([place])    1. COPD with acute exacerbation - 1. Patient looks to be doing very well but on BIPAP 2. Continue home nebs 3. Albuterol PRN 4. Prednisone daily 5. COPD pathway 6. Continuous pulse ox 7. Tele monitor 2. DM2 - 1. Hold metformin 2. Mod scale SSI Q4H (patient on BIPAP and steroids).   DVT  prophylaxis: Lovenox Code Status: Full - confirmed with patient Family Communication: No family in room Consults called: None Admission status: Admit to inpatient   Etta Quill DO [facility] Pager [phone] from 7PM-7AM  If 7AM-7PM, please contact the day physician for the patient [email or web address] Password [facility]  11/24/2016, 5:57 AM

## 2016-11-24 NOTE — ED Triage Notes (Signed)
Pt arrived via [facility] from home  C/o COPD exacerbation and respiratory distress.  Wears 1.5-2L O2 @ home.  EMS gave 2 Duoneb's in route.  125mg  Solumedrol.  2mg  Mag sulfate.

## 2016-11-24 NOTE — ED Notes (Signed)
Paged admitting MD about change in bed request at this time from stepdown to telemetry.

## 2016-11-24 NOTE — ED Notes (Signed)
Admitting at bedside 

## 2016-11-24 NOTE — ED Notes (Addendum)
Pt taken off BiPap, weaned to 2L [place], RT notified. Breathing comfortably, NAD, sats 97%.

## 2016-11-24 NOTE — ED Notes (Signed)
Contacted pharmacy to verify daily meds.

## 2016-11-24 NOTE — Progress Notes (Signed)
I have seen and assessed patient and agree with Dr. Juleen China assessment. Patient is a pleasant 76 year old female history of chronic respiratory failure secondary to COPD on chronic home O2 2 L nasal cannula recently admitted January 2017 requiring intubation, numerous recent exacerbations with 2 courses of steroid tapers in December presented to the ED with severe shortness of breath, in acute respiratory distress despite home nebulizers noted to have retractions and tachycardic in the 130s on admission. ABG done with a pH of 7.281, PCO2 of 75, PO2 of 90, bicarbonate of 35 O2 of 95. Patient was placed on the BiPAP in the ED with some improvement.  Subjective: Patient states feels a little bit better than on presentation. Physical exam Gen.: Patient with pursed lip breathing, some use of accessory muscles of respiration with some retractions. Respirations: Poor air movement. Tight. Some use of accessory muscles of respiration. Cardiovascular: Tachycardic regular rhythm GI: Abdomen is soft, nontender, nondistended, positive bowel sounds Extremities: No clubbing cyanosis or edema  Assessment/plan #1 acute on chronic respiratory failure secondary to acute COPD exacerbation. Patient with multiple admissions over the past 3 months with acute COPD exacerbations. Patient denies any ongoing tobacco abuse. PH on admission was 7.28 PCO2 of 75 PO2 of 90 bicarbonate of 35.2. Patient currently off the BiPAP however some use of accessory muscles of respiration in pursed breathing. Patient's disposition on admission will remain to the step down unit for close monitoring. Will discontinue Brieo, oral prednisone, Spiriva. Place on Solu-Medrol 125 mg IV every 8 hours, Pulmicort, Brovana, Xopenex and Atrovent scheduled nebulizers, IV azithromycin, Protonix, Claritin, Flonase. BiPAP as needed. If no significant improvement will consult with critical care medicine for further evaluation and management.  No charge.

## 2016-11-24 NOTE — ED Provider Notes (Signed)
[facility] DEPT Provider Note   CSN: [id number] Arrival date & time:    By signing my name below, I, [name], attest that this documentation has been prepared under the direction and in the presence of Orpah Greek, MD . Electronically Signed: Dyke Duffy, Scribe. 11/24/2016. 4:54 AM.   History   Chief Complaint Chief Complaint  Patient presents with  . Respiratory Distress    HPI [name] is a 76 y.o. female with a history of CAD, COPD, and MI, brought in by ambulance, who presents to the Emergency Department complaining of constant, gradually worsening, severe shortness breath which began yesterday morning. Per pt, she has used her albuterol inhaler and Spiriva with no significant relief. Pt wears 2 L O2 chronically, and increased her oxygen to 2.5 due to distress.  Pt notes associated palpitations and rhinorrhea. Per EMS, pt was tachycardic in the 120's upon their arrival. She was administered 2 Duoneb's, 125 mg Solumedrol and 2 mg Mag sulfate en route to the ED with some relief of SOB and palpitations. She denies any fever, vomiting, diarrhea, or chest pain.  The history is provided by the patient. No language interpreter was used.   Past Medical History:  Diagnosis Date  . CAD (coronary artery disease)    Stent RCA 2005, stent LAD 2006  . Carotid artery occlusion   . COPD (chronic obstructive pulmonary disease) ([place])   . Depression   . Diabetes mellitus without complication ([place])   . Fatigue   . Heart murmur   . Hyperlipemia   . Hypertension   . Myocardial infarction   . Recurrent upper respiratory infection (URI)   . Shortness of breath   . Tobacco abuse     Patient Active Problem List   Diagnosis Date Noted  . Post herpetic neuralgia 09/06/2016  . Rhinitis, chronic 03/25/2016  . Counseling regarding end of life decision making 03/25/2016  . Acute respiratory failure with hypoxemia ([place])   . COPD with acute exacerbation ([place])   .  Respiratory failure with hypoxia and hypercapnia ([place]) 10/12/2015  . Acute on chronic respiratory failure ([place]) 10/12/2015  . Shoulder pain, left 06/05/2015  . Right-sided chest wall pain 12/21/2014  . Respiratory acidosis 02/16/2012  . Palpitations 12/21/2011  . CHEST PAIN, PRECORDIAL 07/06/2010  . CAROTID ARTERY DISEASE 09/22/2009  . FATIGUE 04/10/2009  . HYPERLIPIDEMIA 04/09/2009  . Tobacco use disorder, severe, in sustained remission 04/09/2009  . HYPERTENSION 04/09/2009  . CAD 04/09/2009  . COPD (chronic obstructive pulmonary disease) with chronic bronchitis ([place]) 04/09/2009    Past Surgical History:  Procedure Laterality Date  . CARDIAC CATHETERIZATION  2011  . coronary angiography  Nov 25, 2004   CYPHER stenting, left anterior descending  . CORONARY STENT PLACEMENT     drug-eluting; right coronary artery  . VESICOVAGINAL FISTULA CLOSURE W/ TAH      OB History    No data available       Home Medications    Prior to Admission medications   Medication Sig Start Date End Date Taking? Authorizing Provider  ADVAIR DISKUS 250-50 MCG/DOSE AEPB INHALE 1 PUFF EVERY 12HRS 02/25/16   Deneise Lever, MD  albuterol (PROVENTIL) (2.5 MG/3ML) 0.083% nebulizer solution USE 1 VIAL IN NEBULIZER EVERY 6HRS FOR WHEEZING 08/11/16   Deneise Lever, MD  Albuterol Sulfate (PROAIR RESPICLICK) [id number] (90 BASE) MCG/ACT AEPB Inhale 2 puffs into the lungs every 6 (six) hours as needed. Patient taking differently: Inhale 2 puffs into the lungs  every 6 (six) hours as needed (shortness of breath/ wheezing).  12/19/14   Deneise Lever, MD  amitriptyline (ELAVIL) 10 MG tablet 1 daily at bedtime 09/06/16   Deneise Lever, MD  Ascorbic Acid (VITAMIN C) 1000 MG tablet Take 1,000 mg by mouth daily.    Historical Provider, MD  aspirin EC 81 MG tablet Take 81 mg by mouth at bedtime.     Historical Provider, MD  Biotin (BIOTIN MAXIMUM STRENGTH) 10 MG TABS Take 10 mg by mouth daily. 10,000 mcg    Historical  Provider, MD  Cholecalciferol (VITAMIN D) 2000 units tablet Take 2,000 Units by mouth daily.    Historical Provider, MD  CINNAMON PO Take 1,000 mg by mouth 2 (two) times daily.     Historical Provider, MD  clonazePAM (KLONOPIN) 1 MG tablet Take 1 mg by mouth at bedtime. Take 1 tablet (1 mg) by mouth every morning, may take an additional tablet later in the day as needed for anxiety    Historical Provider, MD  gabapentin (NEURONTIN) 100 MG capsule Take 1 capsule by mouth 3 (three) times daily as needed. 09/02/16   Historical Provider, MD  guaiFENesin (MUCINEX) 600 MG 12 hr tablet Take 1 tablet (600 mg total) by mouth 2 (two) times daily. 10/19/15   Erick Colace, NP  Melatonin 5 MG TABS Take 5 mg by mouth at bedtime.    Historical Provider, MD  metFORMIN (GLUCOPHAGE-XR) 500 MG 24 hr tablet Take 1 tablet (500 mg) by mouth daily with breakfast, take 2 tablets (1000 mg) with supper    Historical Provider, MD  Multiple Vitamin (MULTIVITAMIN WITH MINERALS) TABS tablet Take 1 tablet by mouth daily.    Historical Provider, MD  nitroGLYCERIN (NITROSTAT) 0.4 MG SL tablet Place 1 tablet (0.4 mg total) under the tongue every 5 (five) minutes as needed for chest pain. 04/09/15   Burnell Blanks, MD  OXYGEN Inhale into the lungs continuous. 2 1/2 - 3 L    Historical Provider, MD  predniSONE (DELTASONE) 10 MG tablet 4 X 2 DAYS, 3 X 2 DAYS, 2 X 2 DAYS, 1 X 2 DAYS 09/06/16   Deneise Lever, MD  predniSONE (DELTASONE) 10 MG tablet Take 4 tab x 2 days, 2 tab x 2 days, 1 tab x 2 days then stop 09/23/16   Tanda Rockers, MD  Red Yeast Rice 600 MG TABS Take 600 mg by mouth daily.    Historical Provider, MD  SPIRIVA HANDIHALER 18 MCG inhalation capsule INHALE 1 CAPSULE DAILY 07/27/16   Deneise Lever, MD  valACYclovir (VALTREX) 500 MG tablet Take 1 tablet by mouth 2 (two) times daily. 08/29/16   Historical Provider, MD  vitamin B-12 (CYANOCOBALAMIN) 1000 MCG tablet Take 1,000 mcg by mouth daily.    Historical  Provider, MD    Family History Family History  Problem Relation Age of Onset  . Diabetes Mother     deceased  . Heart disease Mother   . Alzheimer's disease Father     deceased  . Diabetes Sister   . Heart attack Sister     Social History Social History  Substance Use Topics  . Smoking status: Former Smoker    Packs/day: 0.25    Years: 50.00    Types: Cigarettes    Quit date: 02/15/2012  . Smokeless tobacco: Never Used     Comment: started smoking at age 88/// Pt is currently trying to quit smoking, using the electronic cigs, not longer  smoking tobacco  . Alcohol use Yes     Comment: occasional wine     Allergies   Bactrim [sulfamethoxazole-trimethoprim]; Codeine; and Vytorin [ezetimibe-simvastatin]   Review of Systems Review of Systems 10 systems reviewed and all are negative for acute change except as noted in the HPI.   Physical Exam Updated Vital Signs BP 110/59   Pulse 107   Temp 98.2 F (36.8 C) (Oral)   Resp 21   SpO2 99%   Physical Exam  Constitutional: She is oriented to person, place, and time. She appears well-developed and well-nourished. She appears distressed.  HENT:  Head: Normocephalic and atraumatic.  Right Ear: Hearing normal.  Left Ear: Hearing normal.  Nose: Nose normal.  Mouth/Throat: Oropharynx is clear and moist and mucous membranes are normal.  Eyes: Conjunctivae and EOM are normal. Pupils are equal, round, and reactive to light.  Neck: Normal range of motion. Neck supple.  Cardiovascular: Regular rhythm, S1 normal and S2 normal.  Exam reveals no gallop and no friction rub.   No murmur heard. Pulmonary/Chest: Breath sounds normal. Accessory muscle usage present. Tachypnea noted. She is in respiratory distress. She exhibits no tenderness.  Pt is tripoding.   Abdominal: Soft. Normal appearance and bowel sounds are normal. There is no hepatosplenomegaly. There is no tenderness. There is no rebound, no guarding, no tenderness at  McBurney's point and negative Murphy's sign. No hernia.  Musculoskeletal: Normal range of motion.  Neurological: She is alert and oriented to person, place, and time. She has normal strength. No cranial nerve deficit or sensory deficit. Coordination normal. GCS eye subscore is 4. GCS verbal subscore is 5. GCS motor subscore is 6.  Skin: Skin is warm, dry and intact. No rash noted. No cyanosis.  Psychiatric: She has a normal mood and affect. Her speech is normal and behavior is normal. Thought content normal.  Nursing note and vitals reviewed.    ED Treatments / Results  DIAGNOSTIC STUDIES:  Oxygen Saturation is 100% on aersol mask, nl by my interpretation.    COORDINATION OF CARE:  2:53 AM Discussed treatment plan with pt at bedside and pt agreed to plan.  Labs (all labs ordered are listed, but only abnormal results are displayed) Labs Reviewed  CBC WITH DIFFERENTIAL/PLATELET - Abnormal; Notable for the following:       Result Value   WBC 11.6 (*)    MCV 103.5 (*)    All other components within normal limits  BASIC METABOLIC PANEL - Abnormal; Notable for the following:    Chloride 96 (*)    CO2 33 (*)    Glucose, Bld 232 (*)    All other components within normal limits  I-STAT ARTERIAL BLOOD GAS, ED - Abnormal; Notable for the following:    pH, Arterial 7.281 (*)    pCO2 arterial 74.9 (*)    Bicarbonate 35.2 (*)    Acid-Base Excess 6.0 (*)    All other components within normal limits  BRAIN NATRIURETIC PEPTIDE  I-STAT TROPOININ, ED    EKG  EKG Interpretation  Date/Time:  Thursday November 24 2016 02:48:48 EST Ventricular Rate:  139 PR Interval:    QRS Duration: 86 QT Interval:  394 QTC Calculation: 600 R Axis:   80 Text Interpretation:  Sinus tachycardia Repolarization abnormality, prob rate related Prolonged QT interval No significant change since last tracing Confirmed by Betsey Holiday  MD, Deloma Spindle [phone]) on 11/24/2016 4:00:28 AM       Radiology Dg Chest [facility]  Result Date: 11/24/2016 CLINICAL DATA:  Shortness of breath EXAM: PORTABLE CHEST 1 VIEW COMPARISON:  06/14/2016 FINDINGS: Hyperinflation with emphysematous disease. No focal consolidation or effusion. Coarse interstitial opacities at the lung bases likely chronic. Normal heart size. No pneumothorax. IMPRESSION: Hyperinflation with emphysematous disease. No acute pulmonary infiltrate Electronically Signed   By: Donavan Foil M.D.   On: 11/24/2016 03:28    Procedures Procedures (including critical care time)  Medications Ordered in ED Medications - No data to display   Initial Impression / Assessment and Plan / ED Course  I have reviewed the triage vital signs and the nursing notes.  Pertinent labs & imaging results that were available during my care of the patient were reviewed by me and considered in my medical decision making (see chart for details).     Patient presents to the emergency department for respiratory distress. She has a history of COPD. Patient has presumably severe COPD, required intubation for COPD exacerbation 1 month ago. Patient reports that she was experiencing progressively worsening breathing throughout the day. No flu, infection type symptoms. She does not have a cough. Patient has been using her nebulizers without improvement. At arrival to the ER, patient has received Solu-Medrol, magnesium sulfate, multiple 2 mg with only slight improvement. She is in respiratory distress with tripoding present at arrival. She was placed on BiPAP and has improved significantly. Initial blood gas shows significant CO2 retention with mild hypoxia. Patient will be admitted to the hospital for further management.  CRITICAL CARE Performed by: Orpah Greek   Total critical care time: 30 minutes  Critical care time was exclusive of separately billable procedures and treating other patients.  Critical care was necessary to treat or prevent imminent or life-threatening  deterioration.  Critical care was time spent personally by me on the following activities: development of treatment plan with patient and/or surrogate as well as nursing, discussions with consultants, evaluation of patient's response to treatment, examination of patient, obtaining history from patient or surrogate, ordering and performing treatments and interventions, ordering and review of laboratory studies, ordering and review of radiographic studies, pulse oximetry and re-evaluation of patient's condition.   Final Clinical Impressions(s) / ED Diagnoses   Final diagnoses:  Acute respiratory failure with hypoxia and hypercapnia (HCC)  COPD exacerbation (HCC)    New Prescriptions New Prescriptions   No medications on file  I personally performed the services described in this documentation, which was scribed in my presence. The recorded information has been reviewed and is accurate.    Orpah Greek, MD 11/24/16 [phone]

## 2016-11-24 NOTE — ED Notes (Signed)
Rn called to room by patient for red markings on her veins above her IV site. RN stopped medication at this time. RN spoke with pharmacist that recommended changing IV site and slowing medication at this time.

## 2016-11-25 DIAGNOSIS — D72829 Elevated white blood cell count, unspecified: Secondary | ICD-10-CM

## 2016-11-25 DIAGNOSIS — F32A Depression, unspecified: Secondary | ICD-10-CM

## 2016-11-25 DIAGNOSIS — F329 Major depressive disorder, single episode, unspecified: Secondary | ICD-10-CM

## 2016-11-25 LAB — CBC
HEMATOCRIT: 37.5 % (ref 36.0–46.0)
Hemoglobin: 11.9 g/dL — ABNORMAL LOW (ref 12.0–15.0)
MCH: 32.1 pg (ref 26.0–34.0)
MCHC: 31.7 g/dL (ref 30.0–36.0)
MCV: 101.1 fL — AB (ref 78.0–100.0)
Platelets: 215 10*3/uL (ref 150–400)
RBC: 3.71 MIL/uL — AB (ref 3.87–5.11)
RDW: 13.1 % (ref 11.5–15.5)
WBC: 15 10*3/uL — AB (ref 4.0–10.5)

## 2016-11-25 LAB — GLUCOSE, CAPILLARY
GLUCOSE-CAPILLARY: 190 mg/dL — AB (ref 65–99)
Glucose-Capillary: 194 mg/dL — ABNORMAL HIGH (ref 65–99)
Glucose-Capillary: 271 mg/dL — ABNORMAL HIGH (ref 65–99)
Glucose-Capillary: 190 mg/dL — ABNORMAL HIGH (ref 65–99)

## 2016-11-25 LAB — BASIC METABOLIC PANEL
ANION GAP: 6 (ref 5–15)
BUN: 11 mg/dL (ref 6–20)
CO2: 33 mmol/L — ABNORMAL HIGH (ref 22–32)
Calcium: 9.2 mg/dL (ref 8.9–10.3)
Chloride: 100 mmol/L — ABNORMAL LOW (ref 101–111)
Creatinine, Ser: 0.63 mg/dL (ref 0.44–1.00)
GFR calc Af Amer: >60 mL/min (ref >60)
GFR calc non Af Amer: >60 mL/min (ref >60)
GLUCOSE: 220 mg/dL — AB (ref 65–99)
POTASSIUM: 4.8 mmol/L (ref 3.5–5.1)
Sodium: 139 mmol/L (ref 135–145)

## 2016-11-25 MED ORDER — SODIUM CHLORIDE 0.9 % IV SOLN
INTRAVENOUS | Status: DC
  Administered 2016-11-25 – 2016-11-26 (×2): via INTRAVENOUS

## 2016-11-25 MED ORDER — IPRATROPIUM BROMIDE 0.02 % IN SOLN
0.5000 mg | Freq: Three times a day (TID) | RESPIRATORY_TRACT | Status: DC
  Administered 2016-11-25 – 2016-11-26 (×6): 0.5 mg via RESPIRATORY_TRACT
  Filled 2016-11-25 (×7): qty 2.5

## 2016-11-25 MED ORDER — CLONAZEPAM 0.5 MG PO TABS
1.0000 mg | ORAL_TABLET | Freq: Two times a day (BID) | ORAL | Status: DC | PRN
  Administered 2016-11-25 – 2016-11-28 (×4): 1 mg via ORAL
  Filled 2016-11-25 (×2): qty 2
  Filled 2016-11-25: qty 1
  Filled 2016-11-25: qty 2

## 2016-11-25 MED ORDER — METHYLPREDNISOLONE SODIUM SUCC 125 MG IJ SOLR
80.0000 mg | Freq: Three times a day (TID) | INTRAMUSCULAR | Status: DC
  Administered 2016-11-25 – 2016-11-26 (×3): 80 mg via INTRAVENOUS
  Filled 2016-11-25 (×3): qty 2

## 2016-11-25 MED ORDER — LEVALBUTEROL HCL 0.63 MG/3ML IN NEBU
0.6300 mg | INHALATION_SOLUTION | Freq: Three times a day (TID) | RESPIRATORY_TRACT | Status: DC
  Administered 2016-11-25 – 2016-11-26 (×6): 0.63 mg via RESPIRATORY_TRACT
  Filled 2016-11-25 (×6): qty 3

## 2016-11-25 MED ORDER — SODIUM CHLORIDE 0.9 % IV BOLUS (SEPSIS)
500.0000 mL | Freq: Once | INTRAVENOUS | Status: AC
  Administered 2016-11-25: 500 mL via INTRAVENOUS

## 2016-11-25 NOTE — Care Management Note (Signed)
Case Management Note  Patient Details  Name: [name] MRN: [id number] Date of Birth: 1941/08/31  Subjective/Objective:    Pt lives with husband, states her daughter calls and visits on a frequent basis.  Has home O2 through [place], also has rollator and wheelchair.  CM received a referral to arrange home health services - discussed with pt and she refused, stating that she doesn't need anyone to visit her.  Pt is followed by [facility] community care coordinator.                          Expected Discharge Plan:  Home/Self Care  Discharge planning Services  CM Consult  HH Arranged:  Patient Refused   Status of Service:  Completed, signed off   Girard Cooter, [place] 11/25/2016, 10:53 AM

## 2016-11-25 NOTE — Progress Notes (Signed)
Inpatient Diabetes Program Recommendations  AACE/ADA: New Consensus Statement on Inpatient Glycemic Control (2015)  Target Ranges:  Prepandial:   less than 140 mg/dL      Peak postprandial:   less than 180 mg/dL (1-2 hours)      Critically ill patients:  140 - 180 mg/dL  Results for AINE, BOSTIAN (MRN [id number]) as of 11/25/2016 09:13  Ref. Range 11/24/2016 11:10 11/24/2016 16:56 11/24/2016 21:10 11/25/2016 08:00  Glucose-Capillary Latest Ref Range: 65 - 99 mg/dL 297 (H) 75 241 (H) 194 (H)    Review of Glycemic Control  Diabetes history: DM2 Outpatient Diabetes medications: Metformin XR 500 mg QAM, Metformin XR 1000 mg QPM Current orders for Inpatient glycemic control: Lantus 5 units daily, Novolog 0-20 units TID with meals  Inpatient Diabetes Program Recommendations: Correction (SSI): Please consider ordering Novolog 0-5 units QHS for bedtime correction. HgbA1C: Please consider ordering an A1C to evaluate glycemic control over the past 2-3 months.  Thanks, Barnie Alderman, RN, MSN, CDE Diabetes Coordinator Inpatient Diabetes Program [phone] (Team Pager from 8am to 5pm)

## 2016-11-25 NOTE — Evaluation (Signed)
Physical Therapy Evaluation Patient Details Name: [name] MRN: [id number] DOB: 1940/10/20 Today's Date: 11/25/2016   History of Present Illness  Pt is a 76 y/o female admitted secondary to acute respiratory distress, COPD exacerbation. Pt reports that she is normally on 1.75 L at home at night only. PMH including but not limited to CAD, COPD, DM, HTN and hx of MI.  Clinical Impression  Pt presented supine in bed with HOB elevated, awake and willing to participate in therapy session. Prior to admission, pt reported being independent with functional mobility and ADLs. Pt currently at mod I for bed mobility, min guard for transfers and min guard for ambulation without an AD. However, pt was very limited in distance ambulated this session secondary to being anxious and with increased WOB. Pt on 3 L of supplemental O2 with SPO2 maintaining >94% throughout. All VSS. Pt would continue to benefit from skilled physical therapy services at this time while admitted and after d/c to address her below listed limitations in order to improve her overall safety and independence with functional mobility.      Follow Up Recommendations Home health PT    Equipment Recommendations  None recommended by PT    Recommendations for Other Services       Precautions / Restrictions Precautions Precautions: Fall Restrictions Weight Bearing Restrictions: No      Mobility  Bed Mobility Overal bed mobility: Modified Independent                Transfers Overall transfer level: Needs assistance Equipment used: None Transfers: Sit to/from Stand Sit to Stand: Min guard         General transfer comment: increased time, min guard for safety  Ambulation/Gait Ambulation/Gait assistance: Min guard Ambulation Distance (Feet): 5 Feet Assistive device: None Gait Pattern/deviations: Step-through pattern;Decreased stride length Gait velocity: decreased Gait velocity interpretation: Below normal  speed for age/gender General Gait Details: pt very cautious with ambulation, increased WOB but SPO2 maintained >94% throughout on 3 L of supplemental O2 via [place]  Stairs            Wheelchair Mobility    Modified Rankin (Stroke Patients Only)       Balance Overall balance assessment: Needs assistance Sitting-balance support: Feet supported Sitting balance-Leahy Scale: Good     Standing balance support: During functional activity;No upper extremity supported Standing balance-Leahy Scale: Fair                               Pertinent Vitals/Pain Pain Assessment: No/denies pain    Home Living Family/patient expects to be discharged to:: Private residence Living Arrangements: Spouse/significant other Available Help at Discharge: Family;Available 24 hours/day Type of Home: House Home Access: Stairs to enter Entrance Stairs-Rails: Right;Left;Can reach both Entrance Stairs-Number of Steps: 3 Home Layout: One level Home Equipment: Wheelchair - [employer] - 4 wheels      Prior Function Level of Independence: Independent               Hand Dominance        Extremity/Trunk Assessment   Upper Extremity Assessment Upper Extremity Assessment: Overall WFL for tasks assessed    Lower Extremity Assessment Lower Extremity Assessment: Overall WFL for tasks assessed       Communication   Communication: No difficulties  Cognition Arousal/Alertness: Awake/alert Behavior During Therapy: Anxious Overall Cognitive Status: Within Functional Limits for tasks assessed  General Comments      Exercises     Assessment/Plan    PT Assessment Patient needs continued PT services  PT Problem List Decreased strength;Decreased activity tolerance;Decreased balance;Decreased mobility;Decreased coordination;Decreased safety awareness;Cardiopulmonary status limiting activity       PT Treatment Interventions DME instruction;Gait  training;Stair training;Functional mobility training;Therapeutic activities;Therapeutic exercise;Balance training;Neuromuscular re-education;Patient/family education    PT Goals (Current goals can be found in the Care Plan section)  Acute Rehab PT Goals Patient Stated Goal: return home PT Goal Formulation: With patient Time For Goal Achievement: 12/09/16 Potential to Achieve Goals: Good    Frequency Min 3X/week   Barriers to discharge        Co-evaluation               End of Session Equipment Utilized During Treatment: Oxygen Activity Tolerance: Patient tolerated treatment well Patient left: in chair;with call bell/phone within reach;with chair alarm set;with nursing/sitter in room Nurse Communication: Mobility status PT Visit Diagnosis: Other abnormalities of gait and mobility (R26.89)         Time: [id number] PT Time Calculation (min) (ACUTE ONLY): 20 min   Charges:   PT Evaluation $PT Eval Moderate Complexity: 1 Procedure     PT G CodesClearnce Sorrel Jimma Ortman 11/25/2016, 4:50 PM Sherie Don, [place], DPT [phone]

## 2016-11-25 NOTE — Progress Notes (Signed)
Nutrition Consult/Brief Note  RD consulted per COPD Gold Protocol.  Wt Readings from Last 15 Encounters:  11/24/16 116 lb 10 oz (52.9 kg)  09/06/16 117 lb (53.1 kg)  06/14/16 119 lb (54 kg)  03/25/16 119 lb 9.6 oz (54.3 kg)  12/02/15 118 lb 9.6 oz (53.8 kg)  11/25/15 120 lb 6.4 oz (54.6 kg)  10/29/15 121 lb (54.9 kg)  10/28/15 121 lb (54.9 kg)  10/19/15 120 lb 3.2 oz (54.5 kg)  06/05/15 127 lb 12.8 oz (58 kg)  04/22/15 125 lb (56.7 kg)  04/09/15 125 lb 6.4 oz (56.9 kg)  12/19/14 125 lb 3.2 oz (56.8 kg)  11/03/14 127 lb 3.2 oz (57.7 kg)  08/19/14 131 lb 12.8 oz (59.8 kg)    Body mass index is 22.04 kg/m. Patient meets criteria for Normal based on current BMI.   Current diet order is Carbohydrate Modified, patient is consuming approximately 100% of meals at this time. Labs and medications reviewed.   No nutrition interventions warranted at this time. If nutrition issues arise, please consult RD.   Arthur Holms, RD, LDN Pager #: [phone] After-Hours Pager #: [phone]

## 2016-11-25 NOTE — Consult Note (Signed)
   [facility] CM Inpatient Consult   11/25/2016  [name] 1941/05/06 [id number]   Referral received from inpatient RNCM, Blain Pais,  for ongoing [facility] services.  Patient was found to have a history with [facility] a year ago.  Will follow up on disposition and community care management needs.  For questions, please contact:  Natividad Brood, RN BSN [facility] Liaison  [phone] business mobile phone Toll free office [phone]

## 2016-11-25 NOTE — Consult Note (Signed)
   [facility] CM Inpatient Consult   11/25/2016  ALMETTA GERVIN July 09, 1941 [id number]    [facility] Care Management follow up. Spoke with inpatient RNCM prior to bedside visit. Waited to speak with Mrs. Higashi after her visit with her Doristine Bosworth. Discussed [facility] Care Management referral. Mrs. Duplessis states " I had your program 1 year ago after my hospital visit in January". States " I have a [employer] that comes out once a year. States "I do not need you guys again". Denies having any [facility] Care Management needs at this time. However, she is agreeable to COPD EMMI calls. Provided EMMI informational brochure.   Made inpatient [place], aware that Mrs. Repass declined [facility] but is agreeable to EMMI COPD transition calls and understands that [facility] will follow up if EMMI alerts.   Marthenia Rolling, MSN-Ed, RN,BSN [facility] Liaison [phone]

## 2016-11-25 NOTE — Progress Notes (Signed)
PROGRESS NOTE    [name]  [id number] DOB: 11/23/1940 DOA: 11/24/2016 PCP: Maximino Greenland, MD   Brief Narrative:  Patient is a pleasant 76 year old female history of chronic respiratory failure secondary to COPD on chronic home O2 2 L nasal cannula recently admitted January 2017 requiring intubation, numerous recent exacerbations with 2 courses of steroid tapers in December presented to the ED with severe shortness of breath, in acute respiratory distress despite home nebulizers noted to have retractions and tachycardic in the 130s on admission. ABG done with a pH of 7.281, PCO2 of 75, PO2 of 90, bicarbonate of 35 O2 of 95. Patient was placed on the BiPAP in the ED with some improvement.  Assessment & Plan:   Principal Problem:   Acute respiratory failure with hypoxia and hypercapnia (HCC) Active Problems:   COPD with acute exacerbation (HCC)   Tobacco use disorder, severe, in sustained remission   Essential hypertension   Acute on chronic respiratory failure (HCC)   DM2 (diabetes mellitus, type 2) (HCC)   Leukocytosis   Depression  #1 acute on chronic respiratory failure with hypoxia and hypercapnia secondary to acute COPD exacerbation. Patient with clinical improvement. Patient off the BiPAP and on nasal cannula. Patient speaking in full sentences and less use of accessory muscles of respiration. Chest x-ray negative for any acute infiltrate. Tachycardia and tachypnea improved. Continue Pulmicort, Brovana, Xopenex and Atrovent nebs, IV azithromycin, Flonase, Claritin, Mucinex, IV steroid taper. DC BiPAP. Patient's pulmonologist has been informed of patient's admission per patient request.  #2 hypertension Blood pressure borderline. Patient given a bolus of IV fluids. Normal saline 75 mL per hour for the next 24 hours.  #3 leukocytosis Likely secondary to steroids. Chest x-ray negative for any acute infiltrate. Patient with no urinary symptoms. Monitor with steroid  taper.  #4 diabetes mellitus type 2 Hemoglobin A1c pending. CBGs have ranged from 75-241. Continue Lantus 5 units daily and sliding scale insulin. Follow.  #5 coronary artery disease Stable. Patient noted to be tachycardic on admission which has improved with improvement in respiratory status. Continue aspirin.  #6 depression/anxiety Stable. Continue Klonopin.   DVT prophylaxis: Lovenox Code Status: Full Family Communication: Updated patient and husband at bedside. Disposition Plan: Transfer to telemetry. Home when respiratory status is improved and back to baseline.   Consultants:   None  Procedures:   Chest x-ray 11/24/2016  Antimicrobials:   IV azithromycin 11/24/2016   Subjective: Sitting up at bedside just finished eating breakfast. States shortness of breath has improved since admission. Denies any chest pain. Husband at bedside.  Objective: Vitals:   11/25/16 0801 11/25/16 0916 11/25/16 0920 11/25/16 0923  BP: (!) 112/58     Pulse: 82     Resp: (!) 25     Temp: 97.8 F (36.6 C)     TempSrc: Oral     SpO2: 97% 97% 95% 100%  Weight:      Height:        Intake/Output Summary (Last 24 hours) at 11/25/16 0929 Last data filed at 11/25/16 0740  Gross per 24 hour  Intake              690 ml  Output              830 ml  Net             -140 ml   Filed Weights   11/24/16 1502  Weight: 52.9 kg (116 lb 10 oz)    Examination:  General exam: Appears calm and comfortable  Respiratory system: Less tight. Poor-fair air movement. Less use of accessory muscles of respiration. Cardiovascular system: S1 & S2 heard, RRR. No JVD, murmurs, rubs, gallops or clicks. No pedal edema. Gastrointestinal system: Abdomen is nondistended, soft and nontender. No organomegaly or masses felt. Normal bowel sounds heard. Central nervous system: Alert and oriented. No focal neurological deficits. Extremities: Symmetric 5 x 5 power. Skin: No rashes, lesions or ulcers Psychiatry:  Judgement and insight appear normal. Mood & affect appropriate.     Data Reviewed: I have personally reviewed following labs and imaging studies  CBC:  Recent Labs Lab 11/24/16 0301 11/25/16 0657  WBC 11.6* 15.0*  NEUTROABS 7.4  --   HGB 12.9 11.9*  HCT 41.2 37.5  MCV 103.5* 101.1*  PLT 232 123456   Basic Metabolic Panel:  Recent Labs Lab 11/24/16 0301 11/24/16 0914 11/25/16 0657  NA 142  --  139  K 4.3  --  4.8  CL 96*  --  100*  CO2 33*  --  33*  GLUCOSE 232*  --  220*  BUN 11  --  11  CREATININE 0.63  --  0.63  CALCIUM 9.5  --  9.2  MG  --  2.9*  --    GFR: Estimated Creatinine Clearance: 47.3 mL/min (by C-G formula based on SCr of 0.63 mg/dL). Liver Function Tests: No results for input(s): AST, ALT, ALKPHOS, BILITOT, PROT, ALBUMIN in the last 168 hours. No results for input(s): LIPASE, AMYLASE in the last 168 hours. No results for input(s): AMMONIA in the last 168 hours. Coagulation Profile: No results for input(s): INR, PROTIME in the last 168 hours. Cardiac Enzymes: No results for input(s): CKTOTAL, CKMB, CKMBINDEX, TROPONINI in the last 168 hours. BNP (last 3 results) No results for input(s): PROBNP in the last 8760 hours. HbA1C: No results for input(s): HGBA1C in the last 72 hours. CBG:  Recent Labs Lab 11/24/16 1110 11/24/16 1656 11/24/16 2110 11/25/16 0800  GLUCAP 297* 75 241* 194*   Lipid Profile: No results for input(s): CHOL, HDL, LDLCALC, TRIG, CHOLHDL, LDLDIRECT in the last 72 hours. Thyroid Function Tests: No results for input(s): TSH, T4TOTAL, FREET4, T3FREE, THYROIDAB in the last 72 hours. Anemia Panel: No results for input(s): VITAMINB12, FOLATE, FERRITIN, TIBC, IRON, RETICCTPCT in the last 72 hours. Sepsis Labs: No results for input(s): PROCALCITON, LATICACIDVEN in the last 168 hours.  Recent Results (from the past 240 hour(s))  MRSA PCR Screening     Status: None   Collection Time: 11/24/16  3:15 PM  Result Value Ref Range  Status   MRSA by PCR NEGATIVE NEGATIVE Final    Comment:        The GeneXpert MRSA Assay (FDA approved for NASAL specimens only), is one component of a comprehensive MRSA colonization surveillance program. It is not intended to diagnose MRSA infection nor to guide or monitor treatment for MRSA infections.          Radiology Studies: Dg Chest Port 1 View  Result Date: 11/24/2016 CLINICAL DATA:  Shortness of breath EXAM: PORTABLE CHEST 1 VIEW COMPARISON:  06/14/2016 FINDINGS: Hyperinflation with emphysematous disease. No focal consolidation or effusion. Coarse interstitial opacities at the lung bases likely chronic. Normal heart size. No pneumothorax. IMPRESSION: Hyperinflation with emphysematous disease. No acute pulmonary infiltrate Electronically Signed   By: Donavan Foil M.D.   On: 11/24/2016 03:28        Scheduled Meds: . amitriptyline  10 mg Oral QHS  .  arformoterol  15 mcg Nebulization BID  . aspirin EC  81 mg Oral QHS  . azithromycin  500 mg Intravenous Q24H  . budesonide (PULMICORT) nebulizer solution  0.25 mg Nebulization BID  . clonazePAM  1 mg Oral QHS  . enoxaparin (LOVENOX) injection  40 mg Subcutaneous Q24H  . fluticasone  2 spray Each Nare Daily  . guaiFENesin  600 mg Oral BID  . insulin aspart  0-20 Units Subcutaneous TID WC  . insulin glargine  5 Units Subcutaneous Daily  . ipratropium  0.5 mg Nebulization TID  . levalbuterol  0.63 mg Nebulization TID  . loratadine  10 mg Oral Daily  . mouth rinse  15 mL Mouth Rinse BID  . methylPREDNISolone (SOLU-MEDROL) injection  125 mg Intravenous Q8H  . multivitamin with minerals  1 tablet Oral Daily  . pantoprazole  40 mg Oral Q0600  . sodium chloride flush  3 mL Intravenous Q12H   Continuous Infusions: . sodium chloride       LOS: 1 day    Time spent: 31 mins    Dalaney Needle, MD [facility] Pager [phone]  If 7PM-7AM, please contact night-coverage [email or web address] Password  [facility] 11/25/2016, 9:29 AM

## 2016-11-26 ENCOUNTER — Inpatient Hospital Stay (HOSPITAL_COMMUNITY): Payer: Medicare Other

## 2016-11-26 DIAGNOSIS — F17201 Nicotine dependence, unspecified, in remission: Secondary | ICD-10-CM

## 2016-11-26 LAB — GLUCOSE, CAPILLARY
GLUCOSE-CAPILLARY: 184 mg/dL — AB (ref 65–99)
GLUCOSE-CAPILLARY: 167 mg/dL — AB (ref 65–99)
Glucose-Capillary: 325 mg/dL — ABNORMAL HIGH (ref 65–99)
Glucose-Capillary: 132 mg/dL — ABNORMAL HIGH (ref 65–99)

## 2016-11-26 LAB — CBC
HCT: 35.5 % — ABNORMAL LOW (ref 36.0–46.0)
HEMOGLOBIN: 10.9 g/dL — AB (ref 12.0–15.0)
MCH: 31.5 pg (ref 26.0–34.0)
MCHC: 30.7 g/dL (ref 30.0–36.0)
MCV: 102.6 fL — ABNORMAL HIGH (ref 78.0–100.0)
Platelets: 204 10*3/uL (ref 150–400)
RBC: 3.46 MIL/uL — AB (ref 3.87–5.11)
RDW: 13.2 % (ref 11.5–15.5)
WBC: 17 10*3/uL — ABNORMAL HIGH (ref 4.0–10.5)

## 2016-11-26 LAB — BASIC METABOLIC PANEL
Anion gap: 7 (ref 5–15)
BUN: 19 mg/dL (ref 6–20)
CHLORIDE: 102 mmol/L (ref 101–111)
CO2: 33 mmol/L — ABNORMAL HIGH (ref 22–32)
CREATININE: 0.67 mg/dL (ref 0.44–1.00)
Calcium: 8.7 mg/dL — ABNORMAL LOW (ref 8.9–10.3)
GFR calc non Af Amer: >60 mL/min (ref >60)
Glucose, Bld: 226 mg/dL — ABNORMAL HIGH (ref 65–99)
Potassium: 4.8 mmol/L (ref 3.5–5.1)
SODIUM: 142 mmol/L (ref 135–145)

## 2016-11-26 LAB — HEMOGLOBIN A1C
Hgb A1c MFr Bld: 6.2 % — ABNORMAL HIGH (ref 4.8–5.6)
Mean Plasma Glucose: 131 mg/dL

## 2016-11-26 MED ORDER — METHYLPREDNISOLONE SODIUM SUCC 125 MG IJ SOLR
60.0000 mg | Freq: Three times a day (TID) | INTRAMUSCULAR | Status: DC
  Administered 2016-11-26 – 2016-11-27 (×3): 60 mg via INTRAVENOUS
  Filled 2016-11-26 (×3): qty 2

## 2016-11-26 MED ORDER — FUROSEMIDE 10 MG/ML IJ SOLN
40.0000 mg | Freq: Two times a day (BID) | INTRAMUSCULAR | Status: DC
  Administered 2016-11-26 – 2016-11-27 (×3): 40 mg via INTRAVENOUS
  Filled 2016-11-26 (×4): qty 4

## 2016-11-26 MED ORDER — INSULIN GLARGINE 100 UNIT/ML ~~LOC~~ SOLN
5.0000 [IU] | Freq: Once | SUBCUTANEOUS | Status: AC
  Administered 2016-11-26: 5 [IU] via SUBCUTANEOUS
  Filled 2016-11-26: qty 0.05

## 2016-11-26 MED ORDER — FUROSEMIDE 10 MG/ML IJ SOLN
40.0000 mg | Freq: Once | INTRAMUSCULAR | Status: AC
  Administered 2016-11-26: 40 mg via INTRAVENOUS
  Filled 2016-11-26: qty 4

## 2016-11-26 MED ORDER — INSULIN GLARGINE 100 UNIT/ML ~~LOC~~ SOLN
10.0000 [IU] | Freq: Every day | SUBCUTANEOUS | Status: DC
  Administered 2016-11-27 – 2016-11-30 (×4): 10 [IU] via SUBCUTANEOUS
  Filled 2016-11-26 (×4): qty 0.1

## 2016-11-26 MED ORDER — BUDESONIDE 0.5 MG/2ML IN SUSP
0.5000 mg | Freq: Two times a day (BID) | RESPIRATORY_TRACT | Status: DC
  Administered 2016-11-26 – 2016-11-30 (×7): 0.5 mg via RESPIRATORY_TRACT
  Filled 2016-11-26 (×8): qty 2

## 2016-11-26 NOTE — Progress Notes (Signed)
Patient with no complaints or concerns during 7pm - 7am shift. Alert and oriented, slept during the night.   Eraina Winnie, RN 

## 2016-11-26 NOTE — Progress Notes (Signed)
Transfer Note:   Arrival Method: Wheelchair  Mental Orientation: Alert and oriented x4 Telemetry:3e box 33 Assessment: Completed Skin: Intact Pain:0/10 Tubes: None Safety Measures: Safety Fall Prevention Plan has been discussed  Admission:  [facility] Orientation: Patient has been orientated to the room, unit and staff.  Family: husband at beside   Orders to be reviewed and implemented. Will continue to monitor the patient. Call light has been placed within reach and bed alarm has been activated.   Venetia Night, RN Phone: [phone]

## 2016-11-26 NOTE — Progress Notes (Addendum)
PROGRESS NOTE    [name]  [id number] DOB: Mar 24, 1941 DOA: 11/24/2016 PCP: Maximino Greenland, MD   Brief Narrative:  Patient is a pleasant 76 year old female history of chronic respiratory failure secondary to COPD on chronic home O2 2 L nasal cannula recently admitted January 2017 requiring intubation, numerous recent exacerbations with 2 courses of steroid tapers in December presented to the ED with severe shortness of breath, in acute respiratory distress despite home nebulizers noted to have retractions and tachycardic in the 130s on admission. ABG done with a pH of 7.281, PCO2 of 75, PO2 of 90, bicarbonate of 35 O2 of 95. Patient was placed on the BiPAP in the ED with some improvement.  Assessment & Plan:   Principal Problem:   Acute respiratory failure with hypoxia and hypercapnia (HCC) Active Problems:   COPD with acute exacerbation (HCC)   Tobacco use disorder, severe, in sustained remission   Essential hypertension   Acute on chronic respiratory failure (HCC)   DM2 (diabetes mellitus, type 2) (HCC)   Leukocytosis   Depression  #1 acute on chronic respiratory failure with hypoxia and hypercapnia secondary to acute COPD exacerbation and +/- volume overload. Patient states shortness of breath not as improved as it was yesterday. Patient however speaking in full sentences though occasionally choppy.  Patient off the BiPAP and on nasal cannula. Chest x-ray negative for any acute infiltrate. Tachycardia and tachypnea improved. Continue Pulmicort, Brovana, Xopenex and Atrovent nebs, IV azithromycin, Flonase, Claritin, Mucinex, IV steroid taper. DC BiPAP. Due to patient's worsening shortness of breath will repeat chest x-ray. Lasix 40 mg IV every 12 hours 2 doses. Strict I's and O's. Daily weights. Increase Pulmicort to 0.5 mg twice a day. Patient's pulmonologist has been informed of patient's admission per patient request. If no improvement with diuretics and adjustment of  Pulmicort will consult with critical care medicine for further evaluation and management.  #2 hypertension Blood pressure borderline. Patient given a bolus of IV fluids. Patient also normal saline. Patient with complaints of worsening shortness of breath. Due to concern for volume overload will discontinue IV fluids.   #3 leukocytosis Likely secondary to steroids. Chest x-ray negative for any acute infiltrate. Patient with no urinary symptoms. Monitor with steroid taper.  #4 diabetes mellitus type 2 Hemoglobin A1c 6.2. CBGs ranging from 184 -325. Elevated CBGs likely secondary to IV steroids. Increase Lantus to 10 units daily. Continue sliding scale insulin.   #5 coronary artery disease Stable. Patient noted to be tachycardic on admission which has improved with improvement in respiratory status since admission. Continue aspirin.  #6 depression/anxiety Stable. Continue Klonopin.   DVT prophylaxis: Lovenox Code Status: Full Family Communication: Updated patient. Updated daughter via telephone. Disposition Plan: Home when respiratory status is improved and back to baseline.   Consultants:   None  Procedures:   Chest x-ray 11/24/2016, 11/26/2016  Antimicrobials:   IV azithromycin 11/24/2016   Subjective: Patient laying in bed states shortness of breath is worse than it was yesterday. Patient denies any chest pain.   Objective: Vitals:   11/26/16 0900 11/26/16 0949 11/26/16 1233 11/26/16 1407  BP:   123/71   Pulse: 95  (!) 103   Resp:   18   Temp:   98 F (36.7 C)   TempSrc:   Oral   SpO2: 95% 95% 98% 97%  Weight:      Height:        Intake/Output Summary (Last 24 hours) at 11/26/16 1513 Last data filed  at 11/26/16 1449  Gross per 24 hour  Intake             2415 ml  Output             3245 ml  Net             -830 ml   Filed Weights   11/24/16 1502 11/26/16 0641  Weight: 52.9 kg (116 lb 10 oz) 51.9 kg (114 lb 8 oz)    Examination:  General exam:  Appears calm and comfortable  Respiratory system: Less tight. Poor-fair air movement. Some use of accessory muscles of respiration.Some coarse rhonchorous breath sounds. Cardiovascular system: S1 & S2 heard, RRR. No JVD, murmurs, rubs, gallops or clicks. No pedal edema. Gastrointestinal system: Abdomen is nondistended, soft and nontender. No organomegaly or masses felt. Normal bowel sounds heard. Central nervous system: Alert and oriented. No focal neurological deficits. Extremities: Symmetric 5 x 5 power. Skin: No rashes, lesions or ulcers Psychiatry: Judgement and insight appear normal. Mood & affect appropriate.     Data Reviewed: I have personally reviewed following labs and imaging studies  CBC:  Recent Labs Lab 11/24/16 0301 11/25/16 0657 11/26/16 0435  WBC 11.6* 15.0* 17.0*  NEUTROABS 7.4  --   --   HGB 12.9 11.9* 10.9*  HCT 41.2 37.5 35.5*  MCV 103.5* 101.1* 102.6*  PLT 232 215 0000000   Basic Metabolic Panel:  Recent Labs Lab 11/24/16 0301 11/24/16 0914 11/25/16 0657 11/26/16 0435  NA 142  --  139 142  K 4.3  --  4.8 4.8  CL 96*  --  100* 102  CO2 33*  --  33* 33*  GLUCOSE 232*  --  220* 226*  BUN 11  --  11 19  CREATININE 0.63  --  0.63 0.67  CALCIUM 9.5  --  9.2 8.7*  MG  --  2.9*  --   --    GFR: Estimated Creatinine Clearance: 47.3 mL/min (by C-G formula based on SCr of 0.67 mg/dL). Liver Function Tests: No results for input(s): AST, ALT, ALKPHOS, BILITOT, PROT, ALBUMIN in the last 168 hours. No results for input(s): LIPASE, AMYLASE in the last 168 hours. No results for input(s): AMMONIA in the last 168 hours. Coagulation Profile: No results for input(s): INR, PROTIME in the last 168 hours. Cardiac Enzymes: No results for input(s): CKTOTAL, CKMB, CKMBINDEX, TROPONINI in the last 168 hours. BNP (last 3 results) No results for input(s): PROBNP in the last 8760 hours. HbA1C:  Recent Labs  11/25/16 0657  HGBA1C 6.2*   CBG:  Recent Labs Lab  11/25/16 1220 11/25/16 1611 11/25/16 2044 11/26/16 0744 11/26/16 1128  GLUCAP 271* 190* 190* 184* 325*   Lipid Profile: No results for input(s): CHOL, HDL, LDLCALC, TRIG, CHOLHDL, LDLDIRECT in the last 72 hours. Thyroid Function Tests: No results for input(s): TSH, T4TOTAL, FREET4, T3FREE, THYROIDAB in the last 72 hours. Anemia Panel: No results for input(s): VITAMINB12, FOLATE, FERRITIN, TIBC, IRON, RETICCTPCT in the last 72 hours. Sepsis Labs: No results for input(s): PROCALCITON, LATICACIDVEN in the last 168 hours.  Recent Results (from the past 240 hour(s))  MRSA PCR Screening     Status: None   Collection Time: 11/24/16  3:15 PM  Result Value Ref Range Status   MRSA by PCR NEGATIVE NEGATIVE Final    Comment:        The GeneXpert MRSA Assay (FDA approved for NASAL specimens only), is one component of a comprehensive MRSA colonization surveillance  program. It is not intended to diagnose MRSA infection nor to guide or monitor treatment for MRSA infections.          Radiology Studies: No results found.      Scheduled Meds: . amitriptyline  10 mg Oral QHS  . arformoterol  15 mcg Nebulization BID  . aspirin EC  81 mg Oral QHS  . azithromycin  500 mg Intravenous Q24H  . budesonide (PULMICORT) nebulizer solution  0.5 mg Nebulization BID  . clonazePAM  1 mg Oral QHS  . enoxaparin (LOVENOX) injection  40 mg Subcutaneous Q24H  . fluticasone  2 spray Each Nare Daily  . guaiFENesin  600 mg Oral BID  . insulin aspart  0-20 Units Subcutaneous TID WC  . [START ON 11/27/2016] insulin glargine  10 Units Subcutaneous Daily  . ipratropium  0.5 mg Nebulization TID  . levalbuterol  0.63 mg Nebulization TID  . loratadine  10 mg Oral Daily  . mouth rinse  15 mL Mouth Rinse BID  . methylPREDNISolone (SOLU-MEDROL) injection  60 mg Intravenous Q8H  . multivitamin with minerals  1 tablet Oral Daily  . pantoprazole  40 mg Oral Q0600  . sodium chloride flush  3 mL Intravenous  Q12H   Continuous Infusions:    LOS: 2 days    Time spent: 40 mins    THOMPSON,DANIEL, MD [facility] Pager [phone]  If 7PM-7AM, please contact night-coverage [email or web address] Password [facility] 11/26/2016, 3:13 PM

## 2016-11-27 ENCOUNTER — Inpatient Hospital Stay (HOSPITAL_COMMUNITY): Payer: Medicare Other

## 2016-11-27 DIAGNOSIS — J9601 Acute respiratory failure with hypoxia: Secondary | ICD-10-CM

## 2016-11-27 DIAGNOSIS — J9602 Acute respiratory failure with hypercapnia: Secondary | ICD-10-CM

## 2016-11-27 DIAGNOSIS — J441 Chronic obstructive pulmonary disease with (acute) exacerbation: Secondary | ICD-10-CM

## 2016-11-27 DIAGNOSIS — R0600 Dyspnea, unspecified: Secondary | ICD-10-CM

## 2016-11-27 LAB — BASIC METABOLIC PANEL
Anion gap: 7 (ref 5–15)
BUN: 20 mg/dL (ref 6–20)
CALCIUM: 8.8 mg/dL — AB (ref 8.9–10.3)
CO2: 39 mmol/L — AB (ref 22–32)
Chloride: 96 mmol/L — ABNORMAL LOW (ref 101–111)
Creatinine, Ser: 0.66 mg/dL (ref 0.44–1.00)
GFR calc non Af Amer: >60 mL/min (ref >60)
GLUCOSE: 199 mg/dL — AB (ref 65–99)
Potassium: 4 mmol/L (ref 3.5–5.1)
Sodium: 142 mmol/L (ref 135–145)

## 2016-11-27 LAB — CBC WITH DIFFERENTIAL/PLATELET
BASOS PCT: 0 %
Basophils Absolute: 0 10*3/uL (ref 0.0–0.1)
Eosinophils Absolute: 0 10*3/uL (ref 0.0–0.7)
Eosinophils Relative: 0 %
HEMATOCRIT: 41 % (ref 36.0–46.0)
HEMOGLOBIN: 12.7 g/dL (ref 12.0–15.0)
LYMPHS PCT: 4 %
Lymphs Abs: 0.7 10*3/uL (ref 0.7–4.0)
MCH: 31.8 pg (ref 26.0–34.0)
MCHC: 31 g/dL (ref 30.0–36.0)
MCV: 102.8 fL — AB (ref 78.0–100.0)
MONOS PCT: 3 %
Monocytes Absolute: 0.5 10*3/uL (ref 0.1–1.0)
NEUTROS ABS: 14.9 10*3/uL — AB (ref 1.7–7.7)
NEUTROS PCT: 93 %
Platelets: 188 10*3/uL (ref 150–400)
RBC: 3.99 MIL/uL (ref 3.87–5.11)
RDW: 13.2 % (ref 11.5–15.5)
WBC: 16.1 10*3/uL — ABNORMAL HIGH (ref 4.0–10.5)

## 2016-11-27 LAB — ECHOCARDIOGRAM COMPLETE
HEIGHTINCHES: 61 in
WEIGHTICAEL: 1830.4 [oz_av]

## 2016-11-27 LAB — GLUCOSE, CAPILLARY
GLUCOSE-CAPILLARY: 147 mg/dL — AB (ref 65–99)
Glucose-Capillary: 194 mg/dL — ABNORMAL HIGH (ref 65–99)
Glucose-Capillary: 288 mg/dL — ABNORMAL HIGH (ref 65–99)
Glucose-Capillary: 149 mg/dL — ABNORMAL HIGH (ref 65–99)

## 2016-11-27 LAB — MAGNESIUM: Magnesium: 2 mg/dL (ref 1.7–2.4)

## 2016-11-27 MED ORDER — SENNOSIDES-DOCUSATE SODIUM 8.6-50 MG PO TABS
1.0000 | ORAL_TABLET | Freq: Two times a day (BID) | ORAL | Status: DC
  Administered 2016-11-27 – 2016-11-30 (×4): 1 via ORAL
  Filled 2016-11-27 (×7): qty 1

## 2016-11-27 MED ORDER — POLYETHYLENE GLYCOL 3350 17 G PO PACK
17.0000 g | PACK | Freq: Two times a day (BID) | ORAL | Status: DC
  Administered 2016-11-27 – 2016-11-28 (×3): 17 g via ORAL
  Filled 2016-11-27 (×7): qty 1

## 2016-11-27 MED ORDER — IPRATROPIUM BROMIDE 0.02 % IN SOLN
0.5000 mg | Freq: Four times a day (QID) | RESPIRATORY_TRACT | Status: DC
  Administered 2016-11-27 – 2016-11-28 (×5): 0.5 mg via RESPIRATORY_TRACT
  Filled 2016-11-27 (×5): qty 2.5

## 2016-11-27 MED ORDER — PANTOPRAZOLE SODIUM 40 MG PO TBEC
40.0000 mg | DELAYED_RELEASE_TABLET | Freq: Two times a day (BID) | ORAL | Status: DC
  Administered 2016-11-27 – 2016-11-30 (×6): 40 mg via ORAL
  Filled 2016-11-27 (×6): qty 1

## 2016-11-27 MED ORDER — METHYLPREDNISOLONE SODIUM SUCC 125 MG IJ SOLR
80.0000 mg | Freq: Two times a day (BID) | INTRAMUSCULAR | Status: DC
  Administered 2016-11-27 – 2016-11-28 (×2): 80 mg via INTRAVENOUS
  Filled 2016-11-27 (×2): qty 2

## 2016-11-27 NOTE — Progress Notes (Signed)
MD stated to wean pt oxygen down and he will continue few more doses of lasix.  Family at bedside inquiring about pt weight. Education provided on congestive heart failure  Xena Propst Leory Plowman

## 2016-11-27 NOTE — Progress Notes (Signed)
  Echocardiogram 2D Echocardiogram has been performed.  Bobbye Charleston 11/27/2016, 3:28 PM

## 2016-11-27 NOTE — Progress Notes (Signed)
Paged MD regarding pt has not had a BM in 48 hours. Pt states that is not regular for her. Ask for mirilax or colace order. Awaiting call back   Deitrich Steve

## 2016-11-27 NOTE — Progress Notes (Signed)
PROGRESS NOTE    [name]  [id number] DOB: 01-Jan-1941 DOA: 11/24/2016 PCP: Maximino Greenland, MD   Brief Narrative:  Patient is a pleasant 76 year old female history of chronic respiratory failure secondary to COPD on chronic home O2 2 L nasal cannula recently admitted January 2017 requiring intubation, numerous recent exacerbations with 2 courses of steroid tapers in December presented to the ED with severe shortness of breath, in acute respiratory distress despite home nebulizers noted to have retractions and tachycardic in the 130s on admission. ABG done with a pH of 7.281, PCO2 of 75, PO2 of 90, bicarbonate of 35 O2 of 95. Patient was placed on the BiPAP in the ED with some improvement.  Assessment & Plan:   Principal Problem:   Acute respiratory failure with hypoxia and hypercapnia (HCC) Active Problems:   COPD with acute exacerbation (HCC)   Tobacco use disorder, severe, in sustained remission   Essential hypertension   Acute on chronic respiratory failure (HCC)   DM2 (diabetes mellitus, type 2) (HCC)   Leukocytosis   Depression  #1 acute on chronic respiratory failure with hypoxia and hypercapnia secondary to acute COPD exacerbation and +/- volume overload. Patient states shortness of breath a little bit improved improved from yesterday. Patient however speaking in full sentences though occasionally choppy.  Patient off the BiPAP and on nasal cannula. Chest x-ray negative for any acute infiltrate. Tachycardia and tachypnea improved. Continue Pulmicort, Brovana, Xopenex and Atrovent nebs, IV azithromycin, Flonase, Claritin, Mucinex, IV steroid taper. DC BiPAP.  Due to patient's worsening shortness of breath on 11/26/2016 chest x-ray was repeated which was unremarkable. Patient placed on Lasix 40 mg IV every 12 hours with urine output of 5 L/24 hours. Patient is -2.1 L during this hospitalization. Continue Lasix 40 mg IV every 12 hours for 2 more doses. Check a 2-D echo.  Strict I's and O's. Daily weights. Increased Pulmicort to 0.5 mg twice a day. Patient's pulmonologist has been informed of patient's admission per patient request. Patient and family requesting involvement of pulmonary/PCCM. Thus will consult critical care.  #2 hypertension Blood pressure improved. Patient given a bolus of IV fluids when BP was borderline. Patient with complaints of worsening shortness of breath 11/27/2015 and due to concern for volume overload, IVF were discontinued and patient placed on IV Lasix.   #3 leukocytosis Likely secondary to steroids. Chest x-ray negative for any acute infiltrate. Patient with no urinary symptoms. Monitor with steroid taper.  #4 diabetes mellitus type 2 Hemoglobin A1c 6.2. CBGs ranging from 132 -194. Elevated CBGs likely secondary to IV steroids. Increased Lantus to 10 units daily. Continue sliding scale insulin.   #5 coronary artery disease Stable. Patient noted to be tachycardic on admission which has improved with improvement in respiratory status since admission. Continue aspirin.  #6 depression/anxiety Stable. Continue Klonopin.   DVT prophylaxis: Lovenox Code Status: Full Family Communication: Updated patient. No family at bedside. Disposition Plan: Home when respiratory status is improved and back to baseline.   Consultants:   None  Procedures:   Chest x-ray 11/24/2016, 11/26/2016  Antimicrobials:   IV azithromycin 11/24/2016>>>> oral azithromycin 11/27/2016   Subjective: Patient just finished eating breakfast. Patient states shortness of breath a little bit improved from yesterday. No chest pain.  Family requesting assessment by pulmonary.  Objective: Vitals:   11/26/16 1407 11/26/16 2030 11/27/16 0026 11/27/16 0446  BP:   (!) 154/69 128/72  Pulse:   96 80  Resp:   20 20  Temp:  97.7 F (36.5 C) 98 F (36.7 C)  TempSrc:   Oral Oral  SpO2: 97% 96% 98% 98%  Weight:    51.9 kg (114 lb 6.4 oz)  Height:         Intake/Output Summary (Last 24 hours) at 11/27/16 0840 Last data filed at 11/27/16 0500  Gross per 24 hour  Intake              831 ml  Output             4600 ml  Net            -3769 ml   Filed Weights   11/24/16 1502 11/26/16 0641 11/27/16 0446  Weight: 52.9 kg (116 lb 10 oz) 51.9 kg (114 lb 8 oz) 51.9 kg (114 lb 6.4 oz)    Examination:  General exam: Appears calm and comfortable  Respiratory system: Less tight. Poor-fair air movement. Some use of accessory muscles of respiration.Some bibasilar crackles L > R Cardiovascular system: S1 & S2 heard, RRR. No JVD, murmurs, rubs, gallops or clicks. No pedal edema. Gastrointestinal system: Abdomen is nondistended, soft and nontender. No organomegaly or masses felt. Normal bowel sounds heard. Central nervous system: Alert and oriented. No focal neurological deficits. Extremities: Symmetric 5 x 5 power. Skin: No rashes, lesions or ulcers Psychiatry: Judgement and insight appear normal. Mood & affect appropriate.     Data Reviewed: I have personally reviewed following labs and imaging studies  CBC:  Recent Labs Lab 11/24/16 0301 11/25/16 0657 11/26/16 0435  WBC 11.6* 15.0* 17.0*  NEUTROABS 7.4  --   --   HGB 12.9 11.9* 10.9*  HCT 41.2 37.5 35.5*  MCV 103.5* 101.1* 102.6*  PLT 232 215 0000000   Basic Metabolic Panel:  Recent Labs Lab 11/24/16 0301 11/24/16 0914 11/25/16 0657 11/26/16 0435  NA 142  --  139 142  K 4.3  --  4.8 4.8  CL 96*  --  100* 102  CO2 33*  --  33* 33*  GLUCOSE 232*  --  220* 226*  BUN 11  --  11 19  CREATININE 0.63  --  0.63 0.67  CALCIUM 9.5  --  9.2 8.7*  MG  --  2.9*  --   --    GFR: Estimated Creatinine Clearance: 47.3 mL/min (by C-G formula based on SCr of 0.67 mg/dL). Liver Function Tests: No results for input(s): AST, ALT, ALKPHOS, BILITOT, PROT, ALBUMIN in the last 168 hours. No results for input(s): LIPASE, AMYLASE in the last 168 hours. No results for input(s): AMMONIA in the  last 168 hours. Coagulation Profile: No results for input(s): INR, PROTIME in the last 168 hours. Cardiac Enzymes: No results for input(s): CKTOTAL, CKMB, CKMBINDEX, TROPONINI in the last 168 hours. BNP (last 3 results) No results for input(s): PROBNP in the last 8760 hours. HbA1C:  Recent Labs  11/25/16 0657  HGBA1C 6.2*   CBG:  Recent Labs Lab 11/26/16 0744 11/26/16 1128 11/26/16 1621 11/26/16 2135 11/27/16 0747  GLUCAP 184* 325* 167* 132* 194*   Lipid Profile: No results for input(s): CHOL, HDL, LDLCALC, TRIG, CHOLHDL, LDLDIRECT in the last 72 hours. Thyroid Function Tests: No results for input(s): TSH, T4TOTAL, FREET4, T3FREE, THYROIDAB in the last 72 hours. Anemia Panel: No results for input(s): VITAMINB12, FOLATE, FERRITIN, TIBC, IRON, RETICCTPCT in the last 72 hours. Sepsis Labs: No results for input(s): PROCALCITON, LATICACIDVEN in the last 168 hours.  Recent Results (from the past 240 hour(s))  MRSA  PCR Screening     Status: None   Collection Time: 11/24/16  3:15 PM  Result Value Ref Range Status   MRSA by PCR NEGATIVE NEGATIVE Final    Comment:        The GeneXpert MRSA Assay (FDA approved for NASAL specimens only), is one component of a comprehensive MRSA colonization surveillance program. It is not intended to diagnose MRSA infection nor to guide or monitor treatment for MRSA infections.          Radiology Studies: Dg Chest Port 1 View  Result Date: 11/26/2016 CLINICAL DATA:  Shortness of breath EXAM: PORTABLE CHEST 1 VIEW COMPARISON:  11/24/2016 FINDINGS: The heart size and mediastinal contours are within normal limits. Aortic atherosclerosis noted. Both lungs are clear. The visualized skeletal structures are unremarkable. IMPRESSION: No active disease. Electronically Signed   By: Kerby Moors M.D.   On: 11/26/2016 15:35        Scheduled Meds: . amitriptyline  10 mg Oral QHS  . arformoterol  15 mcg Nebulization BID  . aspirin EC  81  mg Oral QHS  . azithromycin  500 mg Intravenous Q24H  . budesonide (PULMICORT) nebulizer solution  0.5 mg Nebulization BID  . clonazePAM  1 mg Oral QHS  . enoxaparin (LOVENOX) injection  40 mg Subcutaneous Q24H  . fluticasone  2 spray Each Nare Daily  . furosemide  40 mg Intravenous Q12H  . guaiFENesin  600 mg Oral BID  . insulin aspart  0-20 Units Subcutaneous TID WC  . insulin glargine  10 Units Subcutaneous Daily  . ipratropium  0.5 mg Nebulization TID  . levalbuterol  0.63 mg Nebulization TID  . loratadine  10 mg Oral Daily  . mouth rinse  15 mL Mouth Rinse BID  . methylPREDNISolone (SOLU-MEDROL) injection  60 mg Intravenous Q8H  . multivitamin with minerals  1 tablet Oral Daily  . pantoprazole  40 mg Oral Q0600  . sodium chloride flush  3 mL Intravenous Q12H   Continuous Infusions:    LOS: 3 days    Time spent: 60 mins    Edras Wilford, MD [facility] Pager [phone]  If 7PM-7AM, please contact night-coverage [email or web address] Password [email or web address] 11/27/2016, 8:40 AM

## 2016-11-27 NOTE — Consult Note (Signed)
PULMONARY / CRITICAL CARE MEDICINE   Name: [name] MRN: [id number] DOB: 1941-06-24    ADMISSION DATE:  11/24/2016 CONSULTATION DATE:  2/25  REFERRING MD:  [facility]  CHIEF COMPLAINT:  sob  HISTORY OF PRESENT ILLNESS:   29 yowf quit smoking about 2014 followed by Dr Annamaria Boots with [place] on spiriva/advair and multiple forms of saba which she uses as maint also (really not using as prns) but says always a lot better prednisone and w/in a week or so much worse esp since last ov with Dr Annamaria Boots 09/23/16 with gradually worse sob since last dose of pred (she isn't sure when that was) > admitted with severe sob at rest 2/22 but no h/o excess/ purulent sputum or mucus plugs  Or cp/ sinus complaints. Daughter convinced she's not using her meds correctly   No obvious day to day or daytime variability or assoc   chest tightness, subjective wheeze or overt sinus or hb symptoms. No unusual exp hx or h/o childhood pna/ asthma or knowledge of premature birth.  Sleeping ok without nocturnal  or early am exacerbation  of respiratory  c/o's or need for noct saba. Also denies any obvious fluctuation of symptoms with weather or environmental changes or other aggravating or alleviating factors except as outlined above   Current Medications, Allergies, Complete Past Medical History, Past Surgical History, Family History, and Social History were reviewed in [employer] record.  ROS  The following are not active complaints unless bolded sore throat, dysphagia, dental problems, itching, sneezing,  nasal congestion or excess/ purulent secretions, ear ache,   fever, chills, sweats, unintended wt loss, classically pleuritic or exertional cp, hemoptysis,  orthopnea pnd or leg swelling, presyncope, palpitations, abdominal pain, anorexia, nausea, vomiting, diarrhea  or change in bowel or bladder habits, change in stools or urine, dysuria,hematuria,  rash, arthralgias, visual complaints,  headache, numbness, weakness or ataxia or problems with walking or coordination,  change in mood/affect or memory.        PAST MEDICAL HISTORY :  She  has a past medical history of CAD (coronary artery disease); Carotid artery occlusion; COPD (chronic obstructive pulmonary disease) ([place]); Depression; Diabetes mellitus without complication ([place]); Fatigue; Heart murmur; Hyperlipemia; Hypertension; Myocardial infarction; Recurrent upper respiratory infection (URI); Shortness of breath; and Tobacco abuse.  PAST SURGICAL HISTORY: She  has a past surgical history that includes Coronary stent placement; Vesicovaginal fistula closure w/ TAH; coronary angiography (Nov 25, 2004); and Cardiac catheterization (2011).  Allergies  Allergen Reactions  . Bactrim [Sulfamethoxazole-Trimethoprim] Nausea And Vomiting  . Codeine Other (See Comments)    Unknown reaction but cannot take per husband  . Vytorin [Ezetimibe-Simvastatin] Other (See Comments)    Reaction: possibly malaise per husband    No current facility-administered medications on file prior to encounter.    Current Outpatient Prescriptions on File Prior to Encounter  Medication Sig  . ADVAIR DISKUS 250-50 MCG/DOSE AEPB INHALE 1 PUFF EVERY 12HRS  . albuterol (PROVENTIL) (2.5 MG/3ML) 0.083% nebulizer solution USE 1 VIAL IN NEBULIZER EVERY 6HRS FOR WHEEZING  . Ascorbic Acid (VITAMIN C) 1000 MG tablet Take 1,000 mg by mouth daily.  Marland Kitchen aspirin EC 81 MG tablet Take 81 mg by mouth at bedtime.   . Biotin (BIOTIN MAXIMUM STRENGTH) 10 MG TABS Take 10 mg by mouth daily. 10,000 mcg  . Cholecalciferol (VITAMIN D) 2000 units tablet Take 2,000 Units by mouth daily.  Marland Kitchen CINNAMON PO Take 1,000 mg by mouth 2 (two)  times daily.   . clonazePAM (KLONOPIN) 1 MG tablet Take 1 mg by mouth at bedtime. Take 1 tablet (1 mg) by mouth every morning, may take an additional tablet later in the day as needed for anxiety  . guaiFENesin (MUCINEX) 600 MG 12 hr tablet Take 1  tablet (600 mg total) by mouth 2 (two) times daily.  . Melatonin 5 MG TABS Take 5 mg by mouth at bedtime.  . metFORMIN (GLUCOPHAGE-XR) 500 MG 24 hr tablet Take 1 tablet (500 mg) by mouth daily with breakfast, take 2 tablets (1000 mg) with supper  . Multiple Vitamin (MULTIVITAMIN WITH MINERALS) TABS tablet Take 1 tablet by mouth daily.  . nitroGLYCERIN (NITROSTAT) 0.4 MG SL tablet Place 1 tablet (0.4 mg total) under the tongue every 5 (five) minutes as needed for chest pain.  . OXYGEN Inhale into the lungs continuous. 2 1/2 - 3 L  . Red Yeast Rice 600 MG TABS Take 600 mg by mouth daily.  Marland Kitchen SPIRIVA HANDIHALER 18 MCG inhalation capsule INHALE 1 CAPSULE DAILY  . Albuterol Sulfate (PROAIR RESPICLICK) 123XX123 (90 BASE) MCG/ACT AEPB Inhale 2 puffs into the lungs every 6 (six) hours as needed. (Patient taking differently: Inhale 2 puffs into the lungs every 6 (six) hours as needed (shortness of breath/ wheezing). )  . amitriptyline (ELAVIL) 10 MG tablet 1 daily at bedtime    FAMILY HISTORY:  Her indicated that her mother is deceased. She indicated that her father is deceased. She indicated that the status of her sister is unknown. She indicated that her maternal grandmother is deceased. She indicated that her maternal grandfather is deceased. She indicated that her paternal grandmother is deceased. She indicated that her paternal grandfather is deceased.    SOCIAL HISTORY: She  reports that she quit smoking about 4 years ago. Her smoking use included Cigarettes. She has a 12.50 pack-year smoking history. She has never used smokeless tobacco. She reports that she drinks alcohol. She reports that she does not use drugs.    SUBJECTIVE:   comfortable at rest at 30 degree HOB  On 2lpm NP   VITAL SIGNS: BP 128/72 (BP Location: Left Arm)   Pulse 80   Temp 98 F (36.7 C) (Oral)   Resp 20   Ht 5\' 1"  (1.549 m)   Wt 114 lb 6.4 oz (51.9 kg) Comment: scale B  SpO2 98%   BMI 21.62 kg/m   HEMODYNAMICS:     VENTILATOR SETTINGS:    INTAKE / OUTPUT: I/O last 3 completed shifts: In: 2301 [P.O.:1121; I.V.:1180] Out: 5545 [id number]  PHYSICAL EXAMINATION: General:  Chronically ill appearing wf mild increased wob at rest  Wt Readings from Last 3 Encounters:  11/27/16 114 lb 6.4 oz (51.9 kg)  09/06/16 117 lb (53.1 kg)  06/14/16 119 lb (54 kg)    Vital signs reviewed  HEENT: nl dentition,  and oropharynx.     NECK :  without JVD/Nodes/TM/ nl carotid upstrokes bilaterally/ pos acc muscle use    LUNGS:    slt barrel  contour chest with very distant bs bilaterally and prolonged exp   CV:  RRR  no s3 or murmur or increase in P2, and no edema   ABD:  soft and nontender with nl inspiratory excursion in the supine position. No bruits or organomegaly appreciated, bowel sounds nl  MS:  Nl gait/ ext warm without deformities, calf tenderness, cyanosis or clubbing No obvious joint restrictions   SKIN: warm and dry without lesions  NEURO:  alert, approp, nl sensorium with  no motor or cerebellar deficits apparent.    LABS:  BMET  Recent Labs Lab 11/25/16 0657 11/26/16 0435 11/27/16 0829  NA 139 142 142  K 4.8 4.8 4.0  CL 100* 102 96*  CO2 33* 33* 39*  BUN 11 19 20   CREATININE 0.63 0.67 0.66  GLUCOSE 220* 226* 199*    Electrolytes  Recent Labs Lab 11/24/16 0914 11/25/16 0657 11/26/16 0435 11/27/16 0829  CALCIUM  --  9.2 8.7* 8.8*  MG 2.9*  --   --  2.0    CBC  Recent Labs Lab 11/25/16 0657 11/26/16 0435 11/27/16 0829  WBC 15.0* 17.0* 16.1*  HGB 11.9* 10.9* 12.7  HCT 37.5 35.5* 41.0  PLT 215 204 188    Coag's No results for input(s): APTT, INR in the last 168 hours.  Sepsis Markers No results for input(s): LATICACIDVEN, PROCALCITON, O2SATVEN in the last 168 hours.  ABG  Recent Labs Lab 11/24/16 0333  PHART 7.281*  PCO2ART 74.9*  PO2ART 90.0    Liver Enzymes No results for input(s): AST, ALT, ALKPHOS, BILITOT, ALBUMIN in the last 168  hours.  Cardiac Enzymes No results for input(s): TROPONINI, PROBNP in the last 168 hours.  Glucose  Recent Labs Lab 11/25/16 2044 11/26/16 0744 11/26/16 1128 11/26/16 1621 11/26/16 2135 11/27/16 0747  GLUCAP 190* 184* 325* 167* 132* 194*    BNP  11/24/16 = 46   Imaging Dg Chest Port 1 View  Result Date: 11/26/2016 CLINICAL DATA:  Shortness of breath EXAM: PORTABLE CHEST 1 VIEW COMPARISON:  11/24/2016 FINDINGS: The heart size and mediastinal contours are within normal limits. Aortic atherosclerosis noted. Both lungs are clear. The visualized skeletal structures are unremarkable. IMPRESSION: No active disease. Electronically Signed   By: Kerby Moors M.D.   On: 11/26/2016 15:35      ASSESSMENT / PLAN:  1)  Severe copd  GOLD IV/ group D with recurrent exac off prednisone while main on advair/ spiriva - rec laba/ICS neb and change spiriva to respimat on d/c instead of dpi - add max gerd rx empirically -  as up to 75% of pts in some series report no assoc GI/ Heartburn symptoms   2) acute on chronic hypercarbic an hypoxemic resp failure  Though somewhat paradoxic, when the lung fails to clear C02 properly and pC02 rises the lung then becomes a more efficient scavenger of C02 allowing lower work of breathing and  better C02 clearance albeit at a higher serum pC02 level - this is why pts can look a lot better than their ABG's would suggest and why it's so difficult to prognosticate endstage dz.  It's also why I strongly rec DNI status (ventilating pts down to a nl pC02 adversely affects this compensatory mechanism)  - rec adjust 02 to sats low 90s no higher      Will need f/u with Dr Annamaria Boots or me w/in 2 weeks of discharge to keep from recurrent er visits/ admits or consider hospice if we can't get her turned around.    Christinia Gully, MD Pulmonary and [place] [phone] After 5:30 PM or weekends, use [facility] [phone]

## 2016-11-27 NOTE — Progress Notes (Signed)
Patient requested foley cath, but educated her on the high incidence of  Foley cath & infection. Accepted concept well. Assured patient that we will assist frequently to [facility].

## 2016-11-28 DIAGNOSIS — J209 Acute bronchitis, unspecified: Secondary | ICD-10-CM

## 2016-11-28 DIAGNOSIS — I5031 Acute diastolic (congestive) heart failure: Secondary | ICD-10-CM

## 2016-11-28 LAB — BASIC METABOLIC PANEL
Anion gap: 9 (ref 5–15)
BUN: 26 mg/dL — AB (ref 6–20)
CHLORIDE: 87 mmol/L — AB (ref 101–111)
CO2: 45 mmol/L — ABNORMAL HIGH (ref 22–32)
Calcium: 9 mg/dL (ref 8.9–10.3)
Creatinine, Ser: 0.74 mg/dL (ref 0.44–1.00)
GFR calc Af Amer: >60 mL/min (ref >60)
GLUCOSE: 249 mg/dL — AB (ref 65–99)
POTASSIUM: 4.1 mmol/L (ref 3.5–5.1)
Sodium: 141 mmol/L (ref 135–145)

## 2016-11-28 LAB — GLUCOSE, CAPILLARY
GLUCOSE-CAPILLARY: 238 mg/dL — AB (ref 65–99)
GLUCOSE-CAPILLARY: 199 mg/dL — AB (ref 65–99)
GLUCOSE-CAPILLARY: 199 mg/dL — AB (ref 65–99)
Glucose-Capillary: 180 mg/dL — ABNORMAL HIGH (ref 65–99)

## 2016-11-28 LAB — CBC
HEMATOCRIT: 41.6 % (ref 36.0–46.0)
Hemoglobin: 13 g/dL (ref 12.0–15.0)
MCH: 31.9 pg (ref 26.0–34.0)
MCHC: 31.3 g/dL (ref 30.0–36.0)
MCV: 102.2 fL — ABNORMAL HIGH (ref 78.0–100.0)
PLATELETS: 191 10*3/uL (ref 150–400)
RBC: 4.07 MIL/uL (ref 3.87–5.11)
RDW: 12.9 % (ref 11.5–15.5)
WBC: 12.2 10*3/uL — ABNORMAL HIGH (ref 4.0–10.5)

## 2016-11-28 LAB — MAGNESIUM: Magnesium: 2.4 mg/dL (ref 1.7–2.4)

## 2016-11-28 MED ORDER — AZITHROMYCIN 500 MG PO TABS
500.0000 mg | ORAL_TABLET | Freq: Every day | ORAL | Status: DC
  Administered 2016-11-28 – 2016-11-30 (×3): 500 mg via ORAL
  Filled 2016-11-28 (×3): qty 1

## 2016-11-28 MED ORDER — METHYLPREDNISOLONE SODIUM SUCC 40 MG IJ SOLR
40.0000 mg | Freq: Two times a day (BID) | INTRAMUSCULAR | Status: DC
  Administered 2016-11-28 – 2016-11-29 (×2): 40 mg via INTRAVENOUS
  Filled 2016-11-28 (×2): qty 1

## 2016-11-28 MED ORDER — FUROSEMIDE 40 MG PO TABS
40.0000 mg | ORAL_TABLET | Freq: Every day | ORAL | Status: DC
  Administered 2016-11-28 – 2016-11-30 (×3): 40 mg via ORAL
  Filled 2016-11-28 (×3): qty 1

## 2016-11-28 MED ORDER — FAMOTIDINE 20 MG PO TABS
20.0000 mg | ORAL_TABLET | Freq: Every day | ORAL | Status: DC
  Administered 2016-11-28 – 2016-11-30 (×3): 20 mg via ORAL
  Filled 2016-11-28 (×3): qty 1

## 2016-11-28 NOTE — Progress Notes (Signed)
PROGRESS NOTE    POLINA MCGUFFIE  [id number] DOB: [birth year] DOA: 11/24/2016 PCP: Maximino Greenland, MD   Brief Narrative:  Patient is a pleasant 76 year old female history of chronic respiratory failure secondary to COPD on chronic home O2 2 L nasal cannula recently admitted January 2017 requiring intubation, numerous recent exacerbations with 2 courses of steroid tapers in December presented to the ED with severe shortness of breath, in acute respiratory distress despite home nebulizers noted to have retractions and tachycardic in the 130s on admission. ABG done with a pH of 7.281, PCO2 of 75, PO2 of 90, bicarbonate of 35 O2 of 95. Patient was placed on the BiPAP in the ED with some improvement.  Assessment & Plan:   Principal Problem:   Acute respiratory failure with hypoxia and hypercapnia (HCC) Active Problems:   COPD with acute exacerbation (HCC)   Tobacco use disorder, severe, in sustained remission   Essential hypertension   Respiratory failure with hypoxia and hypercapnia (HCC)   Acute on chronic respiratory failure (HCC)   DM2 (diabetes mellitus, type 2) (HCC)   Leukocytosis   Depression   Acute diastolic CHF (congestive heart failure) (HCC)  #1 acute on chronic respiratory failure with hypoxia and hypercapnia secondary to acute COPD exacerbation and +/- volume overload. Patient states shortness of breath improved improved from yesterday with diuresis. Patient however speaking in full sentences though occasionally choppy.  Patient off the BiPAP and on nasal cannula. Chest x-ray negative for any acute infiltrate. Tachycardia and tachypnea improved. Continue Pulmicort, Brovana, Xopenex and Atrovent nebs, oral azithromycin, Flonase, Claritin, Mucinex, IV steroid taper. DC BiPAP.  Due to patient's worsening shortness of breath on 11/26/2016 chest x-ray was repeated which was unremarkable. Patient placed on Lasix 40 mg IV every 12 hours with urine output of 4.175L/24 hours.  Patient is -3.981 L during this hospitalization. 2-D echo with EF of [id number], grade 1 diastolic dysfunction, wall thickness increase in the pattern of moderate LVH. Patient with clinical improvement with diuresis. Discontinue IV Lasix.   Strict I's and O's. Daily weights. Increased Pulmicort to 0.5 mg twice a day. Patient's pulmonologist has been informed of patient's admission per patient request. Patient and family requesting involvement of pulmonary/PCCM. Patient was seen in consultation by pulmonary who recommended maximizing antireflux medication and as such PPI was increased to twice a day. Start Pepcid 20 mg daily.  #2 acute diastolic CHF exacerbation Likely secondary to volume overload from hydration and patient's blood pressure was borderline. Patient denies any chest pain. 2-D echo with EF of 50-55% with grade 1 diastolic dysfunction. Clinical improvement. Patient is -3.91 L during this hospitalization. Discontinue IV Lasix and place on oral Lasix 40 mg daily. Follow.  #3 hypertension Blood pressure improved. Patient given a bolus of IV fluids when BP was borderline. Patient with complaints of worsening shortness of breath 11/27/2015 and due to concern for volume overload, IVF were discontinued and patient placed on IV Lasix.   #4 leukocytosis Likely secondary to steroids. Chest x-ray negative for any acute infiltrate. Patient with no urinary symptoms. WBC trending down. Monitor with steroid taper.  #5 diabetes mellitus type 2 Hemoglobin A1c 6.2. CBGs ranging from 149 -180. Elevated CBGs likely secondary to IV steroids. Increased Lantus to 10 units daily. Monitor with steroid taper. Continue sliding scale insulin.   #6 coronary artery disease Stable. Patient noted to be tachycardic on admission which has improved with improvement in respiratory status since admission. Continue aspirin.  #7 depression/anxiety Stable. Continue Klonopin.  DVT prophylaxis: Lovenox Code Status:  Full Family Communication: Updated patient. No family at bedside. Disposition Plan: Home when respiratory status is improved and back to baseline.   Consultants:   Pulmonary: Dr. Melvyn Novas 11/27/2016  Procedures:   Chest x-ray 11/24/2016, 11/26/2016  2-D echo 11/27/2016  Antimicrobials:   IV azithromycin 11/24/2016>>>> oral azithromycin 11/27/2016   Subjective: Patient states shortness of breath improved with diuresis over the past day. No chest pain. Patient noted to ambulate on room air yesterday and noted to desat. Patient states feeling better and on admission however not at baseline.   Objective: Vitals:   11/27/16 2134 11/28/16 0309 11/28/16 0514 11/28/16 0944  BP: 140/80  123/61   Pulse: (!) 104  77   Resp: 20  20   Temp: 98.3 F (36.8 C)  97.4 F (36.3 C)   TempSrc: Oral  Oral   SpO2: 96%  100% 97%  Weight:  50.5 kg (111 lb 4.8 oz)    Height:        Intake/Output Summary (Last 24 hours) at 11/28/16 1017 Last data filed at 11/28/16 0924  Gross per 24 hour  Intake             1453 ml  Output             3575 ml  Net            -2122 ml   Filed Weights   11/26/16 0641 11/27/16 0446 11/28/16 0309  Weight: 51.9 kg (114 lb 8 oz) 51.9 kg (114 lb 6.4 oz) 50.5 kg (111 lb 4.8 oz)    Examination:  General exam: Appears calm and comfortable  Respiratory system: Less tight. Poor-fair air movement. Cardiovascular system: S1 & S2 heard, RRR. No JVD, murmurs, rubs, gallops or clicks. No pedal edema. Gastrointestinal system: Abdomen is nondistended, soft and nontender. No organomegaly or masses felt. Normal bowel sounds heard. Central nervous system: Alert and oriented. No focal neurological deficits. Extremities: Symmetric 5 x 5 power. Skin: No rashes, lesions or ulcers Psychiatry: Judgement and insight appear normal. Mood & affect appropriate.     Data Reviewed: I have personally reviewed following labs and imaging studies  CBC:  Recent Labs Lab 11/24/16 0301  11/25/16 0657 11/26/16 0435 11/27/16 0829 11/28/16 0535  WBC 11.6* 15.0* 17.0* 16.1* 12.2*  NEUTROABS 7.4  --   --  14.9*  --   HGB 12.9 11.9* 10.9* 12.7 13.0  HCT 41.2 37.5 35.5* 41.0 41.6  MCV 103.5* 101.1* 102.6* 102.8* 102.2*  PLT 232 215 204 188 [id number]   Basic Metabolic Panel:  Recent Labs Lab 11/24/16 0301 11/24/16 0914 11/25/16 0657 11/26/16 0435 11/27/16 0829 11/28/16 0535  NA 142  --  139 142 142 141  K 4.3  --  4.8 4.8 4.0 4.1  CL 96*  --  100* 102 96* 87*  CO2 33*  --  33* 33* 39* 45*  GLUCOSE 232*  --  220* 226* 199* 249*  BUN 11  --  11 19 20  26*  CREATININE 0.63  --  0.63 0.67 0.66 0.74  CALCIUM 9.5  --  9.2 8.7* 8.8* 9.0  MG  --  2.9*  --   --  2.0 2.4   GFR: Estimated Creatinine Clearance: 47.3 mL/min (by C-G formula based on SCr of 0.74 mg/dL). Liver Function Tests: No results for input(s): AST, ALT, ALKPHOS, BILITOT, PROT, ALBUMIN in the last 168 hours. No results for input(s): LIPASE, AMYLASE in the last 168 hours.  No results for input(s): AMMONIA in the last 168 hours. Coagulation Profile: No results for input(s): INR, PROTIME in the last 168 hours. Cardiac Enzymes: No results for input(s): CKTOTAL, CKMB, CKMBINDEX, TROPONINI in the last 168 hours. BNP (last 3 results) No results for input(s): PROBNP in the last 8760 hours. HbA1C: No results for input(s): HGBA1C in the last 72 hours. CBG:  Recent Labs Lab 11/27/16 0747 11/27/16 1138 11/27/16 1700 11/27/16 2155 11/28/16 0739  GLUCAP 194* 288* 147* 149* 180*   Lipid Profile: No results for input(s): CHOL, HDL, LDLCALC, TRIG, CHOLHDL, LDLDIRECT in the last 72 hours. Thyroid Function Tests: No results for input(s): TSH, T4TOTAL, FREET4, T3FREE, THYROIDAB in the last 72 hours. Anemia Panel: No results for input(s): VITAMINB12, FOLATE, FERRITIN, TIBC, IRON, RETICCTPCT in the last 72 hours. Sepsis Labs: No results for input(s): PROCALCITON, LATICACIDVEN in the last 168 hours.  Recent Results  (from the past 240 hour(s))  MRSA PCR Screening     Status: None   Collection Time: 11/24/16  3:15 PM  Result Value Ref Range Status   MRSA by PCR NEGATIVE NEGATIVE Final    Comment:        The GeneXpert MRSA Assay (FDA approved for NASAL specimens only), is one component of a comprehensive MRSA colonization surveillance program. It is not intended to diagnose MRSA infection nor to guide or monitor treatment for MRSA infections.          Radiology Studies: Dg Chest Port 1 View  Result Date: 11/26/2016 CLINICAL DATA:  Shortness of breath EXAM: PORTABLE CHEST 1 VIEW COMPARISON:  11/24/2016 FINDINGS: The heart size and mediastinal contours are within normal limits. Aortic atherosclerosis noted. Both lungs are clear. The visualized skeletal structures are unremarkable. IMPRESSION: No active disease. Electronically Signed   By: Kerby Moors M.D.   On: 11/26/2016 15:35        Scheduled Meds: . amitriptyline  10 mg Oral QHS  . arformoterol  15 mcg Nebulization BID  . aspirin EC  81 mg Oral QHS  . azithromycin  500 mg Oral Daily  . budesonide (PULMICORT) nebulizer solution  0.5 mg Nebulization BID  . clonazePAM  1 mg Oral QHS  . enoxaparin (LOVENOX) injection  40 mg Subcutaneous Q24H  . famotidine  20 mg Oral Daily  . fluticasone  2 spray Each Nare Daily  . guaiFENesin  600 mg Oral BID  . insulin aspart  0-20 Units Subcutaneous TID WC  . insulin glargine  10 Units Subcutaneous Daily  . ipratropium  0.5 mg Nebulization QID  . loratadine  10 mg Oral Daily  . mouth rinse  15 mL Mouth Rinse BID  . methylPREDNISolone (SOLU-MEDROL) injection  80 mg Intravenous Q12H  . multivitamin with minerals  1 tablet Oral Daily  . pantoprazole  40 mg Oral BID AC  . polyethylene glycol  17 g Oral BID  . senna-docusate  1 tablet Oral BID  . sodium chloride flush  3 mL Intravenous Q12H   Continuous Infusions:    LOS: 4 days    Time spent: 10 mins    Laurent Cargile, MD [facility] Pager [phone]  If 7PM-7AM, please contact night-coverage [email or web address] Password [facility] 11/28/2016, 10:17 AM

## 2016-11-28 NOTE — Care Management Note (Signed)
Case Management Note  Patient Details  Name: KHIA SCHMALTZ MRN: [id number] Date of Birth: 09/29/1941  Subjective/Objective:                    Action/Plan:  Patient declined home health again. Has home oxygen with [facility] and rollator and wc Expected Discharge Date:   (unsure)               Expected Discharge Plan:  Home/Self Care  In-House Referral:     Discharge planning Services  CM Consult  Post Acute Care Choice:    Choice offered to:  Patient  DME Arranged:    DME Agency:     HH Arranged:  Patient Refused [place] Agency:     Status of Service:  Completed, signed off  If discussed at [employer], dates discussed:    Additional Comments:  Marilu Favre, RN 11/28/2016, 11:19 AM

## 2016-11-28 NOTE — Progress Notes (Signed)
Physical Therapy Treatment Patient Details Name: [name] MRN: [id number] DOB: 27-Nov-1940 Today's Date: 11/28/2016    History of Present Illness Pt is a 76 y/o female admitted secondary to acute respiratory distress, COPD exacerbation. Pt reports that she is normally on 1.75 L at home at night only. PMH including but not limited to CAD, COPD, DM, HTN and hx of MI.    PT Comments    Pt able to increase ambulation distance with slow guarded gait.  o2 92% and HR 116 on room air.  She requested to use a RW today and did rely on it for support. She reports she has a rollator at home.  Currently declining HHPT.   Follow Up Recommendations  Home health PT;Other (comment) (pt currently refusing)     Equipment Recommendations  None recommended by PT    Recommendations for Other Services       Precautions / Restrictions Precautions Precautions: Fall Restrictions Weight Bearing Restrictions: No    Mobility  Bed Mobility Overal bed mobility: Modified Independent                Transfers Overall transfer level: Modified independent Equipment used: None Transfers: Sit to/from [facility] Sit to Stand: Supervision Stand pivot transfers: Supervision       General transfer comment: S for safety  Ambulation/Gait Ambulation/Gait assistance: Min guard Ambulation Distance (Feet): 150 Feet Assistive device: Rolling walker (2 wheeled) Gait Pattern/deviations: Step-through pattern Gait velocity: very slow and cautious Gait velocity interpretation: Below normal speed for age/gender General Gait Details: Pt ambulated very slowly and guarded on 2 L/min with o2 92% and HR 116. Pt had requested to use RW, although normally does not use. Pt with some unsteadiness noted and was reliant on RW for balance.   Stairs            Wheelchair Mobility    Modified Rankin (Stroke Patients Only)       Balance Overall balance assessment: Needs  assistance Sitting-balance support: Feet supported Sitting balance-Leahy Scale: Good     Standing balance support: During functional activity;No upper extremity supported Standing balance-Leahy Scale: Fair Standing balance comment: Pt able to stand and pull her mesh underwear up                    Cognition Arousal/Alertness: Awake/alert Behavior During Therapy: WFL for tasks assessed/performed Overall Cognitive Status: Within Functional Limits for tasks assessed                      Exercises      General Comments        Pertinent Vitals/Pain Pain Assessment: Faces Faces Pain Scale: No hurt    Home Living                      Prior Function            PT Goals (current goals can now be found in the care plan section) Acute Rehab PT Goals Patient Stated Goal: return home PT Goal Formulation: With patient Time For Goal Achievement: 12/09/16 Potential to Achieve Goals: Good Progress towards PT goals: Progressing toward goals    Frequency    Min 3X/week      PT Plan Current plan remains appropriate    Co-evaluation             End of Session Equipment Utilized During Treatment: Oxygen Activity Tolerance: Patient tolerated treatment well Patient left: in  chair;with call bell/phone within reach;Other (comment) (with NP present) Nurse Communication: Mobility status PT Visit Diagnosis: Difficulty in walking, not elsewhere classified (R26.2)     Time: [id number] PT Time Calculation (min) (ACUTE ONLY): 25 min  Charges:  $Gait Training: 8-22 mins $Therapeutic Activity: 8-22 mins                    G Codes:       Jaysin Gayler LUBECK 11/28/2016, 1:45 PM

## 2016-11-28 NOTE — Evaluation (Addendum)
Occupational Therapy Evaluation Patient Details Name: [name] MRN: [id number] DOB: 07-Dec-1940 Today's Date: 11/28/2016    History of Present Illness Pt is a 76 y/o female admitted secondary to acute respiratory distress, COPD exacerbation. Pt reports that she is normally on 1.75 L at home at night only. PMH including but not limited to CAD, COPD, DM, HTN and hx of MI.   Clinical Impression   PTA, pt lived with husband and was Mod independent for ADLs. Pt performed ADLs and functional mobility with et up and supervision for safety; Min guard for toilet transfer. Pt denies SOB or dizziness and VSS throughout session. Pt near baseline and was educated on energy conservations strategies. Recommend dc home with intermittent supervision for bathing to prevent falls once medically stable per physician. Will sign off acute OT.    Follow Up Recommendations  No OT follow up;Supervision - Intermittent    Equipment Recommendations  None recommended by OT    Recommendations for Other Services       Precautions / Restrictions Precautions Precautions: Fall Restrictions Weight Bearing Restrictions: No      Mobility Bed Mobility Overal bed mobility: Modified Independent                Transfers Overall transfer level: Modified independent Equipment used: None Transfers: Sit to/from Stand Sit to Stand: Supervision         General transfer comment: Extra time; supervision for safety    Balance Overall balance assessment: Needs assistance Sitting-balance support: Feet supported Sitting balance-Leahy Scale: Good     Standing balance support: During functional activity;No upper extremity supported Standing balance-Leahy Scale: Fair                              ADL Overall ADL's : Modified independent Eating/Feeding: Set up;Sitting   Grooming: Wash/dry hands;Wash/dry face;Oral care;Supervision/safety;Standing   Upper Body Bathing: Supervision/  safety;Set up;Sitting   Lower Body Bathing: Set up;Supervison/ safety;Sit to/from stand   Upper Body Dressing : Set up;Sitting;Supervision/safety   Lower Body Dressing: Set up;Supervision/safety;Sit to/from stand   Toilet Transfer: Min guard;Ambulation;BSC   Toileting- Clothing Manipulation and Hygiene: Set up;Supervision/safety;Sitting/lateral lean       Functional mobility during ADLs: Supervision/safety;Modified independent (Extra time) General ADL Comments: Pt performed near baseline and reports "I am slower than before"     Vision         Perception     Praxis      Pertinent Vitals/Pain Pain Assessment: Faces Faces Pain Scale: No hurt     Hand Dominance Right   Extremity/Trunk Assessment Upper Extremity Assessment Upper Extremity Assessment: Overall WFL for tasks assessed   Lower Extremity Assessment Lower Extremity Assessment: Overall WFL for tasks assessed   Cervical / Trunk Assessment Cervical / Trunk Assessment: Normal   Communication Communication Communication: No difficulties   Cognition Arousal/Alertness: Awake/alert Behavior During Therapy: WFL for tasks assessed/performed Overall Cognitive Status: Within Functional Limits for tasks assessed                     General Comments       Exercises       Shoulder Instructions      Home Living Family/patient expects to be discharged to:: Private residence Living Arrangements: Spouse/significant other Available Help at Discharge: Family;Available 24 hours/day Type of Home: House Home Access: Stairs to enter [employer] of Steps: 3 Entrance Stairs-Rails: Right;Left;Can reach both Home Layout: One  level     Bathroom Shower/Tub: Tub/shower unit;Walk-in shower   Bathroom Toilet: Handicapped height     Home Equipment: Wheelchair - [employer] - 2 wheels;Shower seat - built in;Hand held shower head;Adaptive equipment Adaptive Equipment: Reacher        Prior  Functioning/Environment Level of Independence: Independent with assistive device(s)        Comments: Performed ADLs and light IADLs; husband A with IADLs (home 02 L2)        OT Problem List: Decreased strength;Decreased activity tolerance;Decreased safety awareness      OT Treatment/Interventions:      OT Goals(Current goals can be found in the care plan section) Acute Rehab OT Goals Patient Stated Goal: return home OT Goal Formulation: With patient Time For Goal Achievement: 12/12/16 Potential to Achieve Goals: Good  OT Frequency:     Barriers to D/C:            Co-evaluation              End of Session Equipment Utilized During Treatment: Gait belt Nurse Communication: Mobility status  Activity Tolerance: Patient tolerated treatment well Patient left: in bed;with call bell/phone within reach;with family/visitor present;with bed alarm set  OT Visit Diagnosis: Unsteadiness on feet (R26.81);Muscle weakness (generalized) (M62.81)                ADL either performed or assessed with clinical judgement  Time: 0811-0847 OT Time Calculation (min): 36 min Charges:  OT General Charges $OT Visit: 1 Procedure OT Evaluation $OT Eval Low Complexity: 1 Procedure OT Treatments $Self Care/Home Management : 8-22 mins G-Codes:     [employer], OTR/L [place] 11/28/2016, 8:58 AM

## 2016-11-28 NOTE — Progress Notes (Signed)
Patient is tolerating lower O2 at 1.5L/min very well. No complications with weaning down.

## 2016-11-28 NOTE — Progress Notes (Signed)
PULMONARY / CRITICAL CARE MEDICINE   Name: [name] MRN: [id number] DOB: 07-May-1941    ADMISSION DATE:  11/24/2016 CONSULTATION DATE:  2/25  REFERRING MD:  [facility]  CHIEF COMPLAINT:  sob  HISTORY OF PRESENT ILLNESS:   50 yowf quit smoking about 2014 followed by Dr Annamaria Boots with [place] on spiriva/advair and multiple forms of saba which she uses as maint also (really not using as prns) but says always a lot better prednisone and w/in a week or so much worse esp since last ov with Dr Annamaria Boots 09/23/16 with gradually worse sob since last dose of pred (she isn't sure when that was) > admitted with severe sob at rest 2/22 but no h/o excess/ purulent sputum or mucus plugs  Or cp/ sinus complaints. Daughter convinced she's not using her meds correctly   No obvious day to day or daytime variability or assoc   chest tightness, subjective wheeze or overt sinus or hb symptoms. No unusual exp hx or h/o childhood pna/ asthma or knowledge of premature birth.  Sleeping ok without nocturnal  or early am exacerbation  of respiratory  c/o's or need for noct saba. Also denies any obvious fluctuation of symptoms with weather or environmental changes or other aggravating or alleviating factors except as outlined above   Current Medications, Allergies, Complete Past Medical History, Past Surgical History, Family History, and Social History were reviewed in [employer] record.   SUBJECTIVE:  Resting comfortably in chair. Feeling better, but not back to baseline yet.  VITAL SIGNS: BP (!) 152/75 (BP Location: Left Arm)   Pulse (!) 102   Temp 98.1 F (36.7 C) (Oral)   Resp 18   Ht 5\' 1"  (1.549 m)   Wt 50.5 kg (111 lb 4.8 oz) Comment: scale b  SpO2 96%   BMI 21.03 kg/m   HEMODYNAMICS:    VENTILATOR SETTINGS:    INTAKE / OUTPUT: I/O last 3 completed shifts: In: 1931 [P.O.:1431; IV Piggyback:500] Out: 6075 [Urine:6075]  PHYSICAL EXAMINATION: General:  Elderly  female in NAD Neuro:  Alert, oriented, non-focal HEENT:  [place]/AT, No JVD noted, PERRL Cardiovascular:  RRR, no MRG Lungs:  Poor air movement throughout. Worse on L. Some R sided rhonchi Abdomen:  Soft, non-distended Musculoskeletal:  No acute deformity Skin:  Intact, MMM     LABS:  BMET  Recent Labs Lab 11/26/16 0435 11/27/16 0829 11/28/16 0535  NA 142 142 141  K 4.8 4.0 4.1  CL 102 96* 87*  CO2 33* 39* 45*  BUN 19 20 26*  CREATININE 0.67 0.66 0.74  GLUCOSE 226* 199* 249*    Electrolytes  Recent Labs Lab 11/24/16 0914  11/26/16 0435 11/27/16 0829 11/28/16 0535  CALCIUM  --   < > 8.7* 8.8* 9.0  MG 2.9*  --   --  2.0 2.4  < > = values in this interval not displayed.  CBC  Recent Labs Lab 11/26/16 0435 11/27/16 0829 11/28/16 0535  WBC 17.0* 16.1* 12.2*  HGB 10.9* 12.7 13.0  HCT 35.5* 41.0 41.6  PLT 204 188 191    Coag's No results for input(s): APTT, INR in the last 168 hours.  Sepsis Markers No results for input(s): LATICACIDVEN, PROCALCITON, O2SATVEN in the last 168 hours.  ABG  Recent Labs Lab 11/24/16 0333  PHART 7.281*  PCO2ART 74.9*  PO2ART 90.0    Liver Enzymes No results for input(s): AST, ALT, ALKPHOS, BILITOT, ALBUMIN in the last 168 hours.  Cardiac Enzymes No results for input(s): TROPONINI, PROBNP in the last 168 hours.  Glucose  Recent Labs Lab 11/27/16 0747 11/27/16 1138 11/27/16 1700 11/27/16 2155 11/28/16 0739 11/28/16 1137  GLUCAP 194* 288* 147* 149* 180* 238*    BNP  11/24/16 = 46   Imaging No results found.    ASSESSMENT / PLAN:  1)  Severe copd  GOLD IV/ group D with recurrent exac off prednisone while remain on advair/ spiriva - Continue budesonide/brovana nebs, suspect will need this at discharge as well due to frequent SABA use prior to admission. - would D/C on respimat rather than home spiriva if she can afford this. - Continue scheduled atrovent and PRN xopenex. - Maximize GERD therapy with  Protonix EC 40mg  BID. - Wean steroids to solumedrol 40mg  q 12 hours.  2) acute on chronic hypercarbic an hypoxemic resp failure  - Supplemental oxygen now at home rate 1.5L/min - Diurese as tolerated.  Georgann Housekeeper, AGACNP-BC [facility] Pager [phone] or [phone]  Attending Note:  76 year old female with severe COPD, gold 4, presenting with acute exacerbation of COPD with acute hypercarbic and hypoxemic respiratory failure.  On exam, chest is very quite and BS are distant.  I reviewed CXR myself, hyperinflation noted.  Discussed with PCCM-NP.  Acute COPD exacerbation:  - Solumedrol 40 BID  - Budesonide/brovana nebs (will need to see if patient can afford in the outpatient world).  - PRN albuterol via nebs  - Protonix to BID  - Scheduled atrovent.  Acute bronchitis  - Zithromax  - Steroids as above  - IS and flutter valve  Acute on chronic hypoxemic respiratory failure:  - Titrate O2 for sat of 88-92%  - May need an ambulatory desat study to determine new O2 need  GOC:  - Recommend involvement of palliative care as I believe patient is ES-COPD at this point.  PCCM will follow  Patient seen and examined, agree with above note.  I dictated the care and orders written for this patient under my direction.  Rush Farmer, MD [phone]  11/28/2016 12:41 PM

## 2016-11-28 NOTE — Care Management Important Message (Signed)
Important Message  Patient Details  Name: [name] MRN: [id number] Date of Birth: [birth year]   Medicare Important Message Given:  Yes    Kendallyn Lippold [address] 11/28/2016, 3:10 PM

## 2016-11-29 ENCOUNTER — Telehealth: Payer: Self-pay | Admitting: Internal Medicine

## 2016-11-29 DIAGNOSIS — Z7189 Other specified counseling: Secondary | ICD-10-CM

## 2016-11-29 DIAGNOSIS — Z515 Encounter for palliative care: Secondary | ICD-10-CM

## 2016-11-29 DIAGNOSIS — R0902 Hypoxemia: Secondary | ICD-10-CM

## 2016-11-29 LAB — BASIC METABOLIC PANEL
Anion gap: 9 (ref 5–15)
BUN: 28 mg/dL — ABNORMAL HIGH (ref 6–20)
CHLORIDE: 89 mmol/L — AB (ref 101–111)
CO2: 42 mmol/L — ABNORMAL HIGH (ref 22–32)
Calcium: 9.1 mg/dL (ref 8.9–10.3)
Creatinine, Ser: 0.73 mg/dL (ref 0.44–1.00)
Glucose, Bld: 128 mg/dL — ABNORMAL HIGH (ref 65–99)
POTASSIUM: 3.9 mmol/L (ref 3.5–5.1)
SODIUM: 140 mmol/L (ref 135–145)

## 2016-11-29 LAB — CBC
HCT: 41.6 % (ref 36.0–46.0)
HEMOGLOBIN: 13 g/dL (ref 12.0–15.0)
MCH: 31.6 pg (ref 26.0–34.0)
MCHC: 31.3 g/dL (ref 30.0–36.0)
MCV: 101.2 fL — ABNORMAL HIGH (ref 78.0–100.0)
PLATELETS: 190 10*3/uL (ref 150–400)
RBC: 4.11 MIL/uL (ref 3.87–5.11)
RDW: 12.9 % (ref 11.5–15.5)
WBC: 12.2 10*3/uL — ABNORMAL HIGH (ref 4.0–10.5)

## 2016-11-29 LAB — GLUCOSE, CAPILLARY
GLUCOSE-CAPILLARY: 192 mg/dL — AB (ref 65–99)
GLUCOSE-CAPILLARY: 166 mg/dL — AB (ref 65–99)
Glucose-Capillary: 224 mg/dL — ABNORMAL HIGH (ref 65–99)
Glucose-Capillary: 209 mg/dL — ABNORMAL HIGH (ref 65–99)

## 2016-11-29 MED ORDER — PREDNISONE 20 MG PO TABS
40.0000 mg | ORAL_TABLET | Freq: Two times a day (BID) | ORAL | Status: DC
  Administered 2016-11-29 – 2016-11-30 (×2): 40 mg via ORAL
  Filled 2016-11-29 (×2): qty 2

## 2016-11-29 MED ORDER — IPRATROPIUM BROMIDE 0.02 % IN SOLN
0.5000 mg | Freq: Two times a day (BID) | RESPIRATORY_TRACT | Status: DC
  Administered 2016-11-29 – 2016-11-30 (×3): 0.5 mg via RESPIRATORY_TRACT
  Filled 2016-11-29 (×3): qty 2.5

## 2016-11-29 NOTE — Progress Notes (Signed)
RE: Benefit check    Memory Argue CMA        S/W [facility] @ [place] RX # [phone]   1. ARFORMOTEROL NEBULIZER SOLUTION 15 MCG BID  COVER- YES  CO-PAY- $ 95.00   DEDUCTIBLE NOT MET  TIER- 4 DRUG  PRIOR APPROVAL- YES $ [phone]   2. TIOTROPIUM ( SPIRIVA ) INHALATION CAPSUL 18 MCG DAILY  COVER- YES  CO-PAY- $ 45.00 Q/L 1 PER DAY  TIER- 3 DRUG  PRIOR APPROVAL- NO   PHARMACY : [facility]   DEDUCTIBLE NOT MET /MUST PAY $95.00 FOR FIRST RX THEN  SECOND RX WILL BE $ 45.00

## 2016-11-29 NOTE — Progress Notes (Signed)
PULMONARY / CRITICAL CARE MEDICINE   Name: [name] MRN: [id number] DOB: October 22, 1940    ADMISSION DATE:  11/24/2016 CONSULTATION DATE:  2/25  REFERRING MD:  [facility]  CHIEF COMPLAINT:  sob  HISTORY OF PRESENT ILLNESS:   80 yowf quit smoking about 2014 followed by Dr Annamaria Boots with [place] on spiriva/advair and multiple forms of saba which she uses as maint also (really not using as prns) but says always a lot better prednisone and w/in a week or so much worse esp since last ov with Dr Annamaria Boots 09/23/16 with gradually worse sob since last dose of pred (she isn't sure when that was) > admitted with severe sob at rest 2/22 but no h/o excess/ purulent sputum or mucus plugs  Or cp/ sinus complaints. Daughter convinced she's not using her meds correctly   No obvious day to day or daytime variability or assoc   chest tightness, subjective wheeze or overt sinus or hb symptoms. No unusual exp hx or h/o childhood pna/ asthma or knowledge of premature birth.  Sleeping ok without nocturnal  or early am exacerbation  of respiratory  c/o's or need for noct saba. Also denies any obvious fluctuation of symptoms with weather or environmental changes or other aggravating or alleviating factors except as outlined above   Current Medications, Allergies, Complete Past Medical History, Past Surgical History, Family History, and Social History were reviewed in [employer] record.   SUBJECTIVE:  Resting comfortably in bed. Feeling better, but not back to baseline yet. Reports to be 76% of baseline 2/27.  VITAL SIGNS: BP 137/79 (BP Location: Left Arm)   Pulse 82   Temp 97.6 F (36.4 C) (Oral)   Resp 18   Ht 5\' 1"  (1.549 m)   Wt 112 lb 12.8 oz (51.2 kg)   SpO2 99%   BMI 21.31 kg/m   HEMODYNAMICS:    VENTILATOR SETTINGS:    INTAKE / OUTPUT: I/O last 3 completed shifts: In: [id number] [P.O.:1080; I.V.:3; IV Piggyback:250] Out: [id number] [Urine:4275; Stool:1]  PHYSICAL  EXAMINATION: General:  Elderly female in NAD Neuro:  Alert, oriented, non-focal HEENT:  [place]/AT, No JVD noted, PERRL Cardiovascular:  RRR, no MRG Lungs:  Poor air movement throughout. Worse on L. Some R sided rhonchi, O2 down to 1.5 l/m 2/227(home dose) Abdomen:  Soft, non-distended Musculoskeletal:  No acute deformity Skin:  Intact, MMM     LABS:  BMET  Recent Labs Lab 11/27/16 0829 11/28/16 0535 11/29/16 0548  NA 142 141 140  K 4.0 4.1 3.9  CL 96* 87* 89*  CO2 39* 45* 42*  BUN 20 26* 28*  CREATININE 0.66 0.74 0.73  GLUCOSE 199* 249* 128*    Electrolytes  Recent Labs Lab 11/24/16 0914  11/27/16 0829 11/28/16 0535 11/29/16 0548  CALCIUM  --   < > 8.8* 9.0 9.1  MG 2.9*  --  2.0 2.4  --   < > = values in this interval not displayed.  CBC  Recent Labs Lab 11/27/16 0829 11/28/16 0535 11/29/16 0548  WBC 16.1* 12.2* 12.2*  HGB 12.7 13.0 13.0  HCT 41.0 41.6 41.6  PLT 188 191 190    Coag's No results for input(s): APTT, INR in the last 168 hours.  Sepsis Markers No results for input(s): LATICACIDVEN, PROCALCITON, O2SATVEN in the last 168 hours.  ABG  Recent Labs Lab 11/24/16 0333  PHART 7.281*  PCO2ART 74.9*  PO2ART 90.0    Liver Enzymes No results for  input(s): AST, ALT, ALKPHOS, BILITOT, ALBUMIN in the last 168 hours.  Cardiac Enzymes No results for input(s): TROPONINI, PROBNP in the last 168 hours.  Glucose  Recent Labs Lab 11/27/16 2155 11/28/16 0739 11/28/16 1137 11/28/16 1650 11/28/16 2140 11/29/16 0755  GLUCAP 149* 180* 238* 199* 199* 192*    BNP  11/24/16 = 46   Intake/Output Summary (Last 24 hours) at 11/29/16 0844 Last data filed at 11/29/16 0542  Gross per 24 hour  Intake              723 ml  Output             2601 ml  Net            -1878 ml   Imaging No results found.    ASSESSMENT / PLAN:  1)  Severe copd  GOLD IV/ group D with recurrent exac off prednisone while remain on advair/ spiriva - Continue  budesonide/brovana nebs, suspect will need this at discharge as well due to frequent SABA use prior to admission. - would D/C on respimat rather than home spiriva if she can afford this. - Continue scheduled atrovent and PRN xopenex. - Maximize GERD therapy with Protonix EC 40mg  BID. - solumedrol 40mg  q 12 hours and on 2/27 change to po with taper. Would leave in hospital 24 hours off IV steroids to ensure smooth transmission to po steroids. Taper as follows Take 4 tabs  daily with food x 4 days, then 3 tabs daily x 4 days, then 2 tabs daily x 4 days, then 1 tab daily x4 days then stop Follow up with Clint Young. Appointment made with Eric Form ACNP for 12/15/16 @ 10:30 am and then with Dr. Annamaria Boots at later date.  2) acute on chronic hypercarbic an hypoxemic resp failure  - Supplemental oxygen now at home rate 1.5L/min - Diurese as tolerated. Note negative I/o 2/27  Richardson Landry Minor ACNP Maryanna Shape PCCM Pager [phone] till 3 pm If no answer page [phone] 11/29/2016, 8:35 AM  Attending Note:  76 year old female with gold stage 4 COPD presenting with an acute exacerbation of COPD with acute hypercarbic and hypoxemic respiratory failure.  On exam, diminished BS diffusely.  I reviewed CXR myself, hyperinflation noted.  Discussed with PCCM-NP.  Acute COPD exacerbation:             - Change solumedrol to PO prednisone             - Budesonide/brovana nebs as ordered (will need to see if patient can afford in the outpatient world).             - PRN albuterol via nebs             - Protonix to BID             - Scheduled atrovent.  Acute bronchitis             - Zithromax             - Steroids as above             - IS and flutter valve  Acute on chronic hypoxemic respiratory failure:             - Titrate O2 for sat of 88-92%             - May need an ambulatory desat study to determine new O2 need  GOC:             -  Recommend involvement of palliative care as I believe patient is  ES-COPD at this point.  PCCM will follow to insure change to PO prednisone does not cause issues.  Patient seen and examined, agree with above note.  I dictated the care and orders written for this patient under my direction.  Rush Farmer, MD [phone]

## 2016-11-29 NOTE — Telephone Encounter (Signed)
Spoke with Hassan Rowan. Advised her since we are not the ones prescribing this medication, we can not initiate this PA. Hassan Rowan verbalized understanding. Nothing further was needed.

## 2016-11-29 NOTE — Progress Notes (Signed)
[facility] called for prior approval for [facility] tele # [phone]  ; spoke to [place] at [facility], stated that she does not need a prior authorization and the co pay will be zero. Mindi Slicker [facility] [phone]

## 2016-11-29 NOTE — Progress Notes (Signed)
PROGRESS NOTE    [name]  [id number] DOB: 13-Jul-1941 DOA: 11/24/2016 PCP: Maximino Greenland, MD   Brief Narrative:  Patient is a pleasant 76 year old female history of chronic respiratory failure secondary to COPD on chronic home O2 2 L nasal cannula recently admitted January 2017 requiring intubation, numerous recent exacerbations with 2 courses of steroid tapers in December presented to the ED with severe shortness of breath, in acute respiratory distress despite home nebulizers noted to have retractions and tachycardic in the 130s on admission. ABG done with a pH of 7.281, PCO2 of 75, PO2 of 90, bicarbonate of 35 O2 of 95. Patient was placed on the BiPAP in the ED with some improvement.  Assessment & Plan:   Principal Problem:   Acute respiratory failure with hypoxia and hypercapnia (HCC) Active Problems:   COPD with acute exacerbation (HCC)   Tobacco use disorder, severe, in sustained remission   Essential hypertension   Respiratory failure with hypoxia and hypercapnia (HCC)   Acute on chronic respiratory failure (HCC)   DM2 (diabetes mellitus, type 2) (HCC)   Leukocytosis   Depression   Acute diastolic CHF (congestive heart failure) (HCC)   Acute bronchitis  #1 acute on chronic respiratory failure with hypoxia and hypercapnia secondary to acute COPD exacerbation and +/- volume overload. Patient states shortness of breath improved with diuresis. Patient speaking in full sentences though occasionally choppy.  Patient off the BiPAP and on nasal cannula. Chest x-ray negative for any acute infiltrate. Tachycardia and tachypnea improved. Continue Pulmicort, Brovana, Xopenex and Atrovent nebs, oral azithromycin, Flonase, Claritin, Mucinex, IV steroid taper. DC BiPAP.  Due to patient's worsening shortness of breath on 11/26/2016 chest x-ray was repeated which was unremarkable. Patient placed on Lasix 40 mg IV every 12 hours with urine output of 4.175L/24 hours. Patient is -  6.142 L during this hospitalization. 2-D echo with EF of [id number], grade 1 diastolic dysfunction, wall thickness increased in the pattern of moderate LVH. Patient with clinical improvement with diuresis. IV Lasix has been transitioned to oral Lasix.   Strict I's and O's. Daily weights. Increased Pulmicort to 0.5 mg twice a day. Patient's pulmonologist has been informed of patient's admission per patient request. Patient and family requesting involvement of pulmonary/PCCM. Patient was seen in consultation by pulmonary who recommended maximizing antireflux medication and as such PPI was increased to twice a day. Continue Pepcid. PCCM recommending that patient be discharged on budesonide/Brovana and Spiriva respimat as well as a slow steroid taper as outlined their note. Outpatient follow-up has been arranged. Patient being seen by palliative care as patient does have end-stage COPD.  #2 acute diastolic CHF exacerbation Likely secondary to volume overload from hydration and patient's blood pressure was borderline. Patient denies any chest pain. 2-D echo with EF of 50-55% with grade 1 diastolic dysfunction. Clinical improvement. Patient is -6.142 L during this hospitalization. Patient has been transitioned to oral Lasix. Will need outpatient follow-up.  #3 hypertension Blood pressure improved. Patient given a bolus of IV fluids when BP was borderline. Patient with complaints of worsening shortness of breath 11/27/2015 and due to concern for volume overload, IVF were discontinued and patient placed on IV Lasix. Continue oral Lasix.  #4 leukocytosis Likely secondary to steroids. Chest x-ray negative for any acute infiltrate. Patient with no urinary symptoms. WBC trending down. Monitor with steroid taper.  #5 diabetes mellitus type 2 Hemoglobin A1c 6.2. CBGs ranging from 166 -224. Elevated CBGs likely secondary to IV steroids. Increased Lantus to  10 units daily. Monitor with steroid taper. Continue sliding scale  insulin.   #6 coronary artery disease Stable. Patient noted to be tachycardic on admission which has improved with improvement in respiratory status since admission. Continue aspirin.  #7 depression/anxiety Stable. Continue Klonopin.   DVT prophylaxis: Lovenox Code Status: Full Family Communication: Updated patient. No family at bedside. Disposition Plan: Home when respiratory status is improved and back to baseline, with no worsening on oral prednisone. Hopefully home tomorrow.   Consultants:   Pulmonary: Dr. Melvyn Novas 11/27/2016   Palliative care: Vinie Sill, NP [id number]  Procedures:   Chest x-ray 11/24/2016, 11/26/2016  2-D echo 11/27/2016  Antimicrobials:   IV azithromycin 11/24/2016>>>> oral azithromycin 11/27/2016   Subjective: Patient states shortness of breath improved with diuresis.  No chest pain. Patient states feeling better than on admission however not at baseline.   Objective: Vitals:   11/28/16 1953 11/28/16 2350 11/29/16 0513 11/29/16 1000  BP: 124/74  137/79 138/79  Pulse: (!) 106  82 92  Resp: 18  18 18   Temp: 98 F (36.7 C)  97.6 F (36.4 C)   TempSrc: Oral  Oral   SpO2: 94% 96% 99% 96%  Weight:   51.2 kg (112 lb 12.8 oz)   Height:        Intake/Output Summary (Last 24 hours) at 11/29/16 1138 Last data filed at 11/29/16 [id number]  Gross per 24 hour  Intake              600 ml  Output             3001 ml  Net            -2401 ml   Filed Weights   11/27/16 0446 11/28/16 0309 11/29/16 0513  Weight: 51.9 kg (114 lb 6.4 oz) 50.5 kg (111 lb 4.8 oz) 51.2 kg (112 lb 12.8 oz)    Examination:  General exam: Appears calm and comfortable  Respiratory system: Less tight. Poor-fair air movement. Cardiovascular system: S1 & S2 heard, RRR. No JVD, murmurs, rubs, gallops or clicks. No pedal edema. Gastrointestinal system: Abdomen is nondistended, soft and nontender. No organomegaly or masses felt. Normal bowel sounds heard. Central nervous system:  Alert and oriented. No focal neurological deficits. Extremities: Symmetric 5 x 5 power. Skin: No rashes, lesions or ulcers Psychiatry: Judgement and insight appear normal. Mood & affect appropriate.     Data Reviewed: I have personally reviewed following labs and imaging studies  CBC:  Recent Labs Lab 11/24/16 0301 11/25/16 0657 11/26/16 0435 11/27/16 0829 11/28/16 0535 11/29/16 0548  WBC 11.6* 15.0* 17.0* 16.1* 12.2* 12.2*  NEUTROABS 7.4  --   --  14.9*  --   --   HGB 12.9 11.9* 10.9* 12.7 13.0 13.0  HCT 41.2 37.5 35.5* 41.0 41.6 41.6  MCV 103.5* 101.1* 102.6* 102.8* 102.2* 101.2*  PLT 232 215 204 188 191 [id number]   Basic Metabolic Panel:  Recent Labs Lab 11/24/16 0914 11/25/16 0657 11/26/16 0435 11/27/16 0829 11/28/16 0535 11/29/16 0548  NA  --  139 142 142 141 140  K  --  4.8 4.8 4.0 4.1 3.9  CL  --  100* 102 96* 87* 89*  CO2  --  33* 33* 39* 45* 42*  GLUCOSE  --  220* 226* 199* 249* 128*  BUN  --  11 19 20  26* 28*  CREATININE  --  0.63 0.67 0.66 0.74 0.73  CALCIUM  --  9.2 8.7* 8.8* 9.0 9.1  MG  2.9*  --   --  2.0 2.4  --    GFR: Estimated Creatinine Clearance: 47.3 mL/min (by C-G formula based on SCr of 0.73 mg/dL). Liver Function Tests: No results for input(s): AST, ALT, ALKPHOS, BILITOT, PROT, ALBUMIN in the last 168 hours. No results for input(s): LIPASE, AMYLASE in the last 168 hours. No results for input(s): AMMONIA in the last 168 hours. Coagulation Profile: No results for input(s): INR, PROTIME in the last 168 hours. Cardiac Enzymes: No results for input(s): CKTOTAL, CKMB, CKMBINDEX, TROPONINI in the last 168 hours. BNP (last 3 results) No results for input(s): PROBNP in the last 8760 hours. HbA1C: No results for input(s): HGBA1C in the last 72 hours. CBG:  Recent Labs Lab 11/28/16 0739 11/28/16 1137 11/28/16 1650 11/28/16 2140 11/29/16 0755  GLUCAP 180* 238* 199* 199* 192*   Lipid Profile: No results for input(s): CHOL, HDL, LDLCALC,  TRIG, CHOLHDL, LDLDIRECT in the last 72 hours. Thyroid Function Tests: No results for input(s): TSH, T4TOTAL, FREET4, T3FREE, THYROIDAB in the last 72 hours. Anemia Panel: No results for input(s): VITAMINB12, FOLATE, FERRITIN, TIBC, IRON, RETICCTPCT in the last 72 hours. Sepsis Labs: No results for input(s): PROCALCITON, LATICACIDVEN in the last 168 hours.  Recent Results (from the past 240 hour(s))  MRSA PCR Screening     Status: None   Collection Time: 11/24/16  3:15 PM  Result Value Ref Range Status   MRSA by PCR NEGATIVE NEGATIVE Final    Comment:        The GeneXpert MRSA Assay (FDA approved for NASAL specimens only), is one component of a comprehensive MRSA colonization surveillance program. It is not intended to diagnose MRSA infection nor to guide or monitor treatment for MRSA infections.          Radiology Studies: No results found.      Scheduled Meds: . amitriptyline  10 mg Oral QHS  . arformoterol  15 mcg Nebulization BID  . aspirin EC  81 mg Oral QHS  . azithromycin  500 mg Oral Daily  . budesonide (PULMICORT) nebulizer solution  0.5 mg Nebulization BID  . clonazePAM  1 mg Oral QHS  . enoxaparin (LOVENOX) injection  40 mg Subcutaneous Q24H  . famotidine  20 mg Oral Daily  . fluticasone  2 spray Each Nare Daily  . furosemide  40 mg Oral Daily  . guaiFENesin  600 mg Oral BID  . insulin aspart  0-20 Units Subcutaneous TID WC  . insulin glargine  10 Units Subcutaneous Daily  . ipratropium  0.5 mg Nebulization BID  . loratadine  10 mg Oral Daily  . mouth rinse  15 mL Mouth Rinse BID  . multivitamin with minerals  1 tablet Oral Daily  . pantoprazole  40 mg Oral BID AC  . polyethylene glycol  17 g Oral BID  . predniSONE  40 mg Oral BID WC  . senna-docusate  1 tablet Oral BID  . sodium chloride flush  3 mL Intravenous Q12H   Continuous Infusions:    LOS: 5 days    Time spent: 10 mins    Brynne Doane, MD [facility] Pager [phone]  [phone]  If 7PM-7AM, please contact night-coverage [email or web address] Password [facility] 11/29/2016, 11:38 AM

## 2016-11-29 NOTE — Consult Note (Signed)
Consultation Note Date: 11/29/2016   Patient Name: [name]  DOB: 1941/04/10  MRN: [id number]  Age / Sex: 76 y.o., female  PCP: Glendale Chard, MD Referring Physician: Eugenie Filler, MD  Reason for Consultation: Establishing goals of care d/t end stage COPD.   HPI/Patient Profile: 76 y.o. 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   Clinical Assessment and Goals of Care: I met today with [name]. She seems to be in good spirits and tells me that that she is not quite to her baseline. She understands the natural progression of her lung and heart disease. She is very open that she has thought about death and dying especially since Jan 2017 when she was intubated. We discussed at length future desire for intubation - she is torn and tells me that she struggles with not wanting intubation again but also not wanting to leave her family. We discuss that if she decides she wants intubation she must speak with her family as she would only want this short term and she would not want them to have to make decision to extubate.   We discussed aggressive vs comfort paths. We discussed hospice and when/why this this would be helpful. We also discussed QOL and she is hopeful for more time with her family. She is very concerned with being a burden on her family (she took care of her parents and does not want her family to go through what she did). I offered to help her discuss these decisions with her and her family. She is considering outpatient palliative care to help them discuss these decisions.   Primary Decision Maker NEXT OF KIN husband  Joclynn Lumb    SUMMARY OF RECOMMENDATIONS   - Considering code status options - Hopeful for some improvement and some level of QOL  Code Status/Advance Care Planning:  Full code   Symptom Management:   Shortness of breath: Continue oxygen, nebs, steroids, per PCCM. May consider morphine 1-2 mg every 4 hours prn for comfort if other interventions are not effective.    Additional Recommendations (Limitations, Scope, Preferences):  Avoid Hospitalization  Psycho-social/Spiritual:   Desire for further Chaplaincy support:no  Additional Recommendations: Education on Hospice  Prognosis:   Poor with declining respiratory status.   Discharge Planning: To Be Determined      Primary Diagnoses: Present on Admission: . Acute respiratory failure with hypoxia and hypercapnia (HCC) . Acute on chronic respiratory failure ([place]) . COPD with acute exacerbation ([place]) . Essential hypertension . Tobacco use disorder, severe, in sustained remission . Respiratory failure with hypoxia and hypercapnia ([place])   I have reviewed the medical record, interviewed the patient and family, and examined the patient. The following aspects are pertinent.  Past Medical History:  Diagnosis Date  . CAD (coronary artery  disease)    Stent RCA 2005, stent LAD 2006  . Carotid artery occlusion   . COPD (chronic obstructive pulmonary disease) ([place])   . Depression   . Diabetes mellitus without complication ([place])   . Fatigue   . Heart murmur   . Hyperlipemia   . Hypertension   . Myocardial infarction   . Recurrent upper respiratory infection (URI)   . Shortness of breath   . Tobacco abuse    Social History   Social History  . Marital status: Married    Spouse name: N/A  . Number of children: 3  . Years of education: N/A   Occupational History  . Retired Administrator   Social History Main Topics  . Smoking status: Former Smoker    Packs/day: 0.25    Years: 50.00     Types: Cigarettes    Quit date: 02/15/2012  . Smokeless tobacco: Never Used     Comment: started smoking at age 89/// Pt is currently trying to quit smoking, using the electronic cigs, not longer smoking tobacco  . Alcohol use Yes     Comment: occasional wine  . Drug use: No  . Sexual activity: Not Currently    Birth control/ protection: Post-menopausal   Other Topics Concern  . None   Social History Narrative   Husband smokes cigars   Family History  Problem Relation Age of Onset  . Diabetes Mother     deceased  . Heart disease Mother   . Alzheimer's disease Father     deceased  . Diabetes Sister   . Heart attack Sister    Scheduled Meds: . amitriptyline  10 mg Oral QHS  . arformoterol  15 mcg Nebulization BID  . aspirin EC  81 mg Oral QHS  . azithromycin  500 mg Oral Daily  . budesonide (PULMICORT) nebulizer solution  0.5 mg Nebulization BID  . clonazePAM  1 mg Oral QHS  . enoxaparin (LOVENOX) injection  40 mg Subcutaneous Q24H  . famotidine  20 mg Oral Daily  . fluticasone  2 spray Each Nare Daily  . furosemide  40 mg Oral Daily  . guaiFENesin  600 mg Oral BID  . insulin aspart  0-20 Units Subcutaneous TID WC  . insulin glargine  10 Units Subcutaneous Daily  . ipratropium  0.5 mg Nebulization BID  . loratadine  10 mg Oral Daily  . mouth rinse  15 mL Mouth Rinse BID  . multivitamin with minerals  1 tablet Oral Daily  . pantoprazole  40 mg Oral BID AC  . polyethylene glycol  17 g Oral BID  . predniSONE  40 mg Oral BID WC  . senna-docusate  1 tablet Oral BID  . sodium chloride flush  3 mL Intravenous Q12H   Continuous Infusions: PRN Meds:.clonazePAM, gabapentin, levalbuterol Allergies  Allergen Reactions  . Bactrim [Sulfamethoxazole-Trimethoprim] Nausea And Vomiting  . Codeine Other (See Comments)    Unknown reaction but cannot take per husband  . Vytorin [Ezetimibe-Simvastatin] Other (See Comments)    Reaction: possibly malaise per husband   Review of  Systems  Constitutional: Positive for activity change and fatigue.  Respiratory: Positive for cough and shortness of breath.     Physical Exam  Constitutional: She is oriented to person, place, and time. She appears well-developed.  HENT:  Head: Normocephalic and atraumatic.  Cardiovascular: Normal rate.   Pulmonary/Chest: Effort normal. No accessory muscle usage. No tachypnea. No respiratory distress.  Abdominal: Normal appearance.  Neurological: She is alert and oriented to person, place, and time.  Nursing note and vitals reviewed.   Vital Signs: BP 138/79 (BP Location: Left Arm)   Pulse 92   Temp 97.6 F (36.4 C) (Oral)   Resp 18   Ht '5\' 1"'  (1.549 m)   Wt 51.2 kg (112 lb 12.8 oz)   SpO2 94%   BMI 21.31 kg/m  Pain Assessment: No/denies pain POSS *See Group Information*: 1-Acceptable,Awake and alert Pain Score: 0-No pain   SpO2: SpO2: 94 % O2 Device:SpO2: 94 % O2 Flow Rate: .O2 Flow Rate (L/min): 2 L/min  IO: Intake/output summary:  Intake/Output Summary (Last 24 hours) at 11/29/16 1310 Last data filed at 11/29/16 1610  Gross per 24 hour  Intake              600 ml  Output             2501 ml  Net            -1901 ml    LBM: Last BM Date: 11/28/16 Baseline Weight: Weight: 52.9 kg (116 lb 10 oz) Most recent weight: Weight: 51.2 kg (112 lb 12.8 oz)     Palliative Assessment/Data:      Time Total: 85mn  Greater than 50%  of this time was spent counseling and coordinating care related to the above assessment and plan.  Signed by: AVinie Sill NP Palliative Medicine Team Pager # [phone](M-F 8a-5p) Team Phone # [phone](Nights/Weekends)

## 2016-11-30 ENCOUNTER — Telehealth: Payer: Self-pay | Admitting: Internal Medicine

## 2016-11-30 DIAGNOSIS — I10 Essential (primary) hypertension: Secondary | ICD-10-CM

## 2016-11-30 DIAGNOSIS — I5031 Acute diastolic (congestive) heart failure: Secondary | ICD-10-CM

## 2016-11-30 LAB — BASIC METABOLIC PANEL
ANION GAP: 9 (ref 5–15)
BUN: 27 mg/dL — ABNORMAL HIGH (ref 6–20)
CALCIUM: 9.1 mg/dL (ref 8.9–10.3)
CO2: 41 mmol/L — ABNORMAL HIGH (ref 22–32)
Chloride: 88 mmol/L — ABNORMAL LOW (ref 101–111)
Creatinine, Ser: 0.82 mg/dL (ref 0.44–1.00)
GLUCOSE: 261 mg/dL — AB (ref 65–99)
POTASSIUM: 4.2 mmol/L (ref 3.5–5.1)
SODIUM: 138 mmol/L (ref 135–145)

## 2016-11-30 LAB — CBC
HEMATOCRIT: 42.1 % (ref 36.0–46.0)
HEMOGLOBIN: 13.5 g/dL (ref 12.0–15.0)
MCH: 32.2 pg (ref 26.0–34.0)
MCHC: 32.1 g/dL (ref 30.0–36.0)
MCV: 100.5 fL — ABNORMAL HIGH (ref 78.0–100.0)
Platelets: 181 10*3/uL (ref 150–400)
RBC: 4.19 MIL/uL (ref 3.87–5.11)
RDW: 12.8 % (ref 11.5–15.5)
WBC: 13.1 10*3/uL — AB (ref 4.0–10.5)

## 2016-11-30 LAB — GLUCOSE, CAPILLARY
GLUCOSE-CAPILLARY: 134 mg/dL — AB (ref 65–99)
Glucose-Capillary: 199 mg/dL — ABNORMAL HIGH (ref 65–99)

## 2016-11-30 MED ORDER — INSULIN GLARGINE 100 UNIT/ML SOLOSTAR PEN: 10.0000 [IU] | PEN_INJECTOR | Freq: Every day | SUBCUTANEOUS | 1 refills | Status: DC

## 2016-11-30 MED ORDER — FLUTICASONE PROPIONATE 50 MCG/ACT NA SUSP: 2.0000 | Freq: Every day | NASAL | 2 refills | Status: DC

## 2016-11-30 MED ORDER — AZITHROMYCIN 500 MG PO TABS: 500.0000 mg | ORAL_TABLET | Freq: Every day | ORAL | 0 refills | Status: DC

## 2016-11-30 MED ORDER — CLONAZEPAM 1 MG PO TABS: 1.0000 mg | ORAL_TABLET | Freq: Every day | ORAL | 0 refills | Status: AC

## 2016-11-30 MED ORDER — IPRATROPIUM BROMIDE 0.02 % IN SOLN: 0.5000 mg | Freq: Two times a day (BID) | RESPIRATORY_TRACT | 2 refills | Status: DC

## 2016-11-30 MED ORDER — INSULIN ASPART 100 UNIT/ML FLEXPEN: PEN_INJECTOR | SUBCUTANEOUS | 0 refills | Status: DC

## 2016-11-30 MED ORDER — GABAPENTIN 100 MG PO CAPS: 100.0000 mg | ORAL_CAPSULE | Freq: Three times a day (TID) | ORAL | 0 refills | Status: AC | PRN

## 2016-11-30 MED ORDER — FAMOTIDINE 20 MG PO TABS: 20.0000 mg | ORAL_TABLET | Freq: Every day | ORAL | 0 refills | Status: AC

## 2016-11-30 MED ORDER — SENNOSIDES-DOCUSATE SODIUM 8.6-50 MG PO TABS: 1.0000 | ORAL_TABLET | Freq: Every evening | ORAL | 0 refills | Status: AC | PRN

## 2016-11-30 MED ORDER — ARFORMOTEROL TARTRATE 15 MCG/2ML IN NEBU: 15.0000 ug | INHALATION_SOLUTION | Freq: Two times a day (BID) | RESPIRATORY_TRACT | 0 refills | Status: DC

## 2016-11-30 MED ORDER — PANTOPRAZOLE SODIUM 40 MG PO TBEC: 40.0000 mg | DELAYED_RELEASE_TABLET | Freq: Two times a day (BID) | ORAL | 0 refills | Status: DC

## 2016-11-30 MED ORDER — POTASSIUM CHLORIDE ER 10 MEQ PO TBCR: 10.0000 meq | EXTENDED_RELEASE_TABLET | Freq: Every day | ORAL | 0 refills | Status: DC

## 2016-11-30 MED ORDER — LORATADINE 10 MG PO TABS: 10.0000 mg | ORAL_TABLET | Freq: Every day | ORAL | 0 refills | Status: DC

## 2016-11-30 MED ORDER — INSULIN GLARGINE 100 UNIT/ML ~~LOC~~ SOLN: 10.0000 [IU] | Freq: Every day | SUBCUTANEOUS | 0 refills | Status: DC

## 2016-11-30 MED ORDER — INSULIN STARTER KIT- PEN NEEDLES (ENGLISH)
1.0000 | Freq: Once | Status: AC
  Administered 2016-11-30: 1
  Filled 2016-11-30: qty 1

## 2016-11-30 MED ORDER — FUROSEMIDE 40 MG PO TABS: 40.0000 mg | ORAL_TABLET | Freq: Every day | ORAL | 0 refills | Status: DC

## 2016-11-30 MED ORDER — LEVALBUTEROL HCL 0.63 MG/3ML IN NEBU: 0.6300 mg | INHALATION_SOLUTION | RESPIRATORY_TRACT | 1 refills | Status: DC | PRN

## 2016-11-30 MED ORDER — PREDNISONE 10 MG PO TABS: ORAL_TABLET | ORAL | 0 refills | Status: DC

## 2016-11-30 MED ORDER — BUDESONIDE 0.5 MG/2ML IN SUSP: 0.5000 mg | Freq: Two times a day (BID) | RESPIRATORY_TRACT | 1 refills | Status: DC

## 2016-11-30 NOTE — Discharge Instructions (Signed)

## 2016-11-30 NOTE — Progress Notes (Signed)
PT Cancellation Note  Patient Details Name: [name] MRN: [id number] DOB: 03/11/1941   Cancelled Treatment:    Reason Eval/Treat Not Completed: Other (comment) (Pt is in discharge process and will see tomorrow if dc held)   Ramond Dial 11/30/2016, 12:13 PM   Mee Hives, PT MS Acute Rehab Dept. Number: [place]

## 2016-11-30 NOTE — Progress Notes (Signed)
Patient alert and oriented, denies pain, no shortness of breath, VSS, SR on the monitor. patient request allergy med because she is sneeze, will give Claritin and flonase per order. Will continue to monitor the patient.

## 2016-11-30 NOTE — Progress Notes (Signed)
Referral made to Outpatient Palliative Care as requested; patient chose [facility] [place]; patient refused [place] services at this time; Aneta Mins [phone]

## 2016-11-30 NOTE — Telephone Encounter (Signed)
Medication for PA needed is: Novolog. Pharmacy is aware that they should send the information to her PCP.

## 2016-11-30 NOTE — Progress Notes (Signed)
Patient alert and oriented, denies pain, no shortness of breath, VSS. Diabetic teaching/education done , patient was able to draw up insulin and stick herself. D/c instruction and prescription explain and given to the patient and husband. Iv and tele d/c, all questions answered,.Patient verbalized understanding.D/c patient home per order.

## 2016-11-30 NOTE — Consult Note (Signed)
   [facility] CM Inpatient Consult   11/30/2016  [name] 06-May-1941 [id number]   Update:  Patient given a brochure and contact information.  Patient endorses Dr. Benson Setting as her primary care provider.  She states she is to have follow up with Palliative Care.  Explained to her that she will receive full care management under the Hospice and Palliative Care program.  No [facility] care management needs noted at this time.  For questions,  Natividad Brood, RN BSN [facility] Liaison  [phone] business mobile phone Toll free office [phone] '

## 2016-11-30 NOTE — Discharge Summary (Addendum)
Physician Discharge Summary  [name] [id number] DOB: 12/22/1940 DOA: 11/24/2016  PCP: Maximino Greenland, MD  Admit date: 11/24/2016 Discharge date: 11/30/2016  Recommendations for Outpatient Follow-up:  Continue outpatient palliative care follow up.  Continue prednisone on discharge as follows: Take 4 tablets (total 40 mg) for 4 days from 2/28 through 3/3 Take 3 tablets (total 30 mg) for 4 days from 3/4 through 3/7 Take 2 tablets (total 20 mg) for 4 days from 3/8 through 3/11 Take 1 tablet (10 mg) for 4 days from 3/12 thorough 3/15  Take insulin as prescribed. Lantus 10 units daily and then sliding scale insulin as prescribed.   Discharge Diagnoses:  Principal Problem:   Acute respiratory failure with hypoxia and hypercapnia (HCC) Active Problems:   Tobacco use disorder, severe, in sustained remission   Essential hypertension   Respiratory failure with hypoxia and hypercapnia (HCC)   Acute on chronic respiratory failure (HCC)   COPD with acute exacerbation (HCC)   DM2 (diabetes mellitus, type 2) (HCC)   Leukocytosis   Depression   Acute diastolic CHF (congestive heart failure) (HCC)   Acute bronchitis   Goals of care, counseling/discussion   Palliative care encounter    Discharge Condition: stable   Diet recommendation: as tolerated   History of present illness:   Per brief narrative 2/27 "Patient is a pleasant 76 year old female history of chronic respiratory failure secondary to COPD on chronic home O2 2 L nasal cannula recently admitted January 2017 requiring intubation, numerous recent exacerbations with 2 courses of steroid tapers in December presented to the ED with severe shortness of breath, in acute respiratory distress despite home nebulizers noted to have retractions and tachycardic in the 130s on admission. ABG done with a pH of 7.281, PCO2 of 75, PO2 of 90, bicarbonate of 35 O2 of 95. Patient was placed on the BiPAP in the ED with some  improvement."  Hospital Course:  Acute on chronic respiratory failure with hypoxia and hypercapnia secondary to acute COPD exacerbation and +/- volume overload Patient states shortness of breath improved with diuresis Continue oxygen support via [place] to keep O2 sats above 88% Continue brovana and Pulmicort nebulizer treatments  Continue Atrovent and xopenex nebulizer treatments as prescribed Resume Spiriva inhaler  Prednisone taper as noted above Patient being seen by palliative care as patient does have end-stage COPD.  Acute diastolic CHF exacerbation Likely secondary to volume overload from hydration 2-D echo with EF of 50-55% with grade 1 diastolic dysfunction Continue lasix on discharge   Essential hypertension Continue oral Lasix.  Leukocytosis  From steroids   Diabetes mellitus type 2 with diabetic neuropathy / Steroid induced hyperglycemia Continue Lantus 10 units daily and SSI Continue gabapentin   Coronary artery disease Stable.  Patient noted to be tachycardic on admission which has improved with improvement in respiratory status since admission. Continue aspirin.  Depression/anxiety Stable. Continue Klonopin and Elavil    DVT prophylaxis: Lovenox in hospital  Code Status: Full Family Communication: Updated patient.Family not at the bedside this am   Consultants:   Pulmonary: Dr. Melvyn Novas 11/27/2016   Palliative care: Vinie Sill, NP [id number]  Procedures:   Chest x-ray 11/24/2016, 11/26/2016  2-D echo 11/27/2016  Antimicrobials:   IV azithromycin 11/24/2016>>>> oral azithromycin 11/27/2016   Signed:  Leisa Lenz, MD  [facility] 11/30/2016, 10:15 AM  Pager #: [phone]  Time spent in minutes: more than 30 minutes   Discharge Exam: Vitals:   11/30/16 0621 11/30/16 0846  BP: 112/68 124/72  Pulse: 87 96  Resp: (!) 22 20  Temp: 97.5 F (36.4 C) 97.7 F (36.5 C)   Vitals:   11/29/16 1947 11/29/16 2130 11/30/16 0621  11/30/16 0846  BP: 119/64  112/68 124/72  Pulse: (!) 102 (!) 106 87 96  Resp: 18 18 (!) 22 20  Temp: 98.4 F (36.9 C)  97.5 F (36.4 C) 97.7 F (36.5 C)  TempSrc: Oral  Oral Oral  SpO2: 95% 96% 100% 97%  Weight:   50.7 kg (111 lb 12.8 oz)   Height:        General: Pt is alert, follows commands appropriately, not in acute distress Cardiovascular: Regular rate and rhythm, S1/S2 (+) Respiratory: coarse sounds, diminished  Abdominal: Soft, non tender, non distended, bowel sounds +, no guarding Extremities: no cyanosis, pulses palpable bilaterally DP and PT Neuro: Grossly nonfocal  Discharge Instructions  Discharge Instructions    Call MD for:  persistant nausea and vomiting    Complete by:  As directed    Call MD for:  redness, tenderness, or signs of infection (pain, swelling, redness, odor or green/yellow discharge around incision site)    Complete by:  As directed    Call MD for:  severe uncontrolled pain    Complete by:  As directed    Diet - low sodium heart healthy    Complete by:  As directed    Discharge instructions    Complete by:  As directed    Continue outpatient palliative care follow up.  Continue prednisone on discharge as follows: Take 4 tablets (total 40 mg) for 4 days from 2/28 through 3/3 Take 3 tablets (total 30 mg) for 4 days from 3/4 through 3/7 Take 2 tablets (total 20 mg) for 4 days from 3/8 through 3/11 Take 1 tablet (10 mg) for 4 days from 3/12 thorough 3/15  Take insulin as prescribed. Lantus 10 units daily and then sliding scale insulin as prescribed.   Increase activity slowly    Complete by:  As directed      Allergies as of 11/30/2016      Reactions   Bactrim [sulfamethoxazole-trimethoprim] Nausea And Vomiting   Codeine Other (See Comments)   Unknown reaction but cannot take per husband   Vytorin [ezetimibe-simvastatin] Other (See Comments)   Reaction: possibly malaise per husband      Medication List    STOP taking these  medications   ADVAIR DISKUS 250-50 MCG/DOSE Aepb Generic drug:  Fluticasone-Salmeterol   albuterol (2.5 MG/3ML) 0.083% nebulizer solution Commonly known as:  PROVENTIL   Albuterol Sulfate 108 (90 Base) MCG/ACT Aepb Commonly known as:  PROAIR RESPICLICK   CINNAMON PO   metFORMIN 500 MG 24 hr tablet Commonly known as:  GLUCOPHAGE-XR   Red Yeast Rice 600 MG Tabs   SPIRIVA HANDIHALER 18 MCG inhalation capsule Generic drug:  tiotropium     TAKE these medications   amitriptyline 10 MG tablet Commonly known as:  ELAVIL 1 daily at bedtime   arformoterol 15 MCG/2ML Nebu Commonly known as:  BROVANA Take 2 mLs (15 mcg total) by nebulization 2 (two) times daily.   aspirin EC 81 MG tablet Take 81 mg by mouth at bedtime.   BIOTIN MAXIMUM STRENGTH 10 MG Tabs Generic drug:  Biotin Take 10 mg by mouth daily. 10,000 mcg   budesonide 0.5 MG/2ML nebulizer solution Commonly known as:  PULMICORT Take 2 mLs (0.5 mg total) by nebulization 2 (two) times daily.   clonazePAM 1 MG tablet Commonly known as:  KLONOPIN Take 1 tablet (1 mg total) by mouth at bedtime. What changed:  additional instructions   famotidine 20 MG tablet Commonly known as:  PEPCID Take 1 tablet (20 mg total) by mouth daily. Start taking on:  12/01/2016   fluticasone 50 MCG/ACT nasal spray Commonly known as:  FLONASE Place 2 sprays into both nostrils daily. Start taking on:  12/01/2016   furosemide 40 MG tablet Commonly known as:  LASIX Take 1 tablet (40 mg total) by mouth daily. Start taking on:  12/01/2016   gabapentin 100 MG capsule Commonly known as:  NEURONTIN Take 1 capsule (100 mg total) by mouth 3 (three) times daily as needed (pain). What changed:  reasons to take this   guaiFENesin 600 MG 12 hr tablet Commonly known as:  MUCINEX Take 1 tablet (600 mg total) by mouth 2 (two) times daily.   insulin aspart 100 UNIT/ML FlexPen Commonly known as:  NOVOLOG FLEXPEN Sliding scale insulin  CBG 121 - 150: 3  units  CBG 151 - 200: 4 units  CBG 201 - 250: 7 units  CBG 251 - 300: 11 units  CBG 301 - 350: 15 units  CBG 351 - 400: 20 units   Insulin Glargine 100 UNIT/ML Solostar Pen Commonly known as:  LANTUS Inject 10 Units into the skin daily.   ipratropium 0.02 % nebulizer solution Commonly known as:  ATROVENT Take 2.5 mLs (0.5 mg total) by nebulization 2 (two) times daily.   levalbuterol 0.63 MG/3ML nebulizer solution Commonly known as:  XOPENEX Take 3 mLs (0.63 mg total) by nebulization every 2 (two) hours as needed for wheezing or shortness of breath.   loratadine 10 MG tablet Commonly known as:  CLARITIN Take 1 tablet (10 mg total) by mouth daily. Start taking on:  12/01/2016   Melatonin 5 MG Tabs Take 5 mg by mouth at bedtime.   multivitamin with minerals Tabs tablet Take 1 tablet by mouth daily.   nitroGLYCERIN 0.4 MG SL tablet Commonly known as:  NITROSTAT Place 1 tablet (0.4 mg total) under the tongue every 5 (five) minutes as needed for chest pain.   OXYGEN Inhale into the lungs continuous. 2 1/2 - 3 L   pantoprazole 40 MG tablet Commonly known as:  PROTONIX Take 1 tablet (40 mg total) by mouth 2 (two) times daily before a meal.   potassium chloride 10 MEQ tablet Commonly known as:  K-DUR Take 1 tablet (10 mEq total) by mouth daily.   predniSONE 10 MG tablet Commonly known as:  DELTASONE Take 4 tablets (total 40 mg) for 4 days from 2/28 through 3/3 Take 3 tablets (total 30 mg) for 4 days from 3/4 through 3/7 Take 2 tablets (total 20 mg) for 4 days from 3/8 through 3/11 Take 1 tablet (10 mg) for 4 days from 3/12 thorough 3/15   senna-docusate 8.6-50 MG tablet Commonly known as:  Senokot-S Take 1 tablet by mouth at bedtime as needed for mild constipation.   vitamin C 1000 MG tablet Take 1,000 mg by mouth daily.   Vitamin D 2000 units tablet Take 2,000 Units by mouth daily.       Follow-up Information    Magdalen Spatz, NP Follow up on 12/15/2016.   Specialty:   Pulmonary Disease Why:  10:30 am with Eric Form ACNP Contact information: [address] [place] [address] [phone]        Maximino Greenland, MD. Schedule an appointment as soon as possible for  a visit in 1 week(s).   Specialty:  Internal Medicine Contact information: [address] [place] [address] [phone]            The results of significant diagnostics from this hospitalization (including imaging, microbiology, ancillary and laboratory) are listed below for reference.    Significant Diagnostic Studies: Dg Chest Port 1 View  Result Date: 11/26/2016 CLINICAL DATA:  Shortness of breath EXAM: PORTABLE CHEST 1 VIEW COMPARISON:  11/24/2016 FINDINGS: The heart size and mediastinal contours are within normal limits. Aortic atherosclerosis noted. Both lungs are clear. The visualized skeletal structures are unremarkable. IMPRESSION: No active disease. Electronically Signed   By: Kerby Moors M.D.   On: 11/26/2016 15:35   Dg Chest Port 1 View  Result Date: 11/24/2016 CLINICAL DATA:  Shortness of breath EXAM: PORTABLE CHEST 1 VIEW COMPARISON:  06/14/2016 FINDINGS: Hyperinflation with emphysematous disease. No focal consolidation or effusion. Coarse interstitial opacities at the lung bases likely chronic. Normal heart size. No pneumothorax. IMPRESSION: Hyperinflation with emphysematous disease. No acute pulmonary infiltrate Electronically Signed   By: Donavan Foil M.D.   On: 11/24/2016 03:28    Microbiology: Recent Results (from the past 240 hour(s))  MRSA PCR Screening     Status: None   Collection Time: 11/24/16  3:15 PM  Result Value Ref Range Status   MRSA by PCR NEGATIVE NEGATIVE Final     Labs: Basic Metabolic Panel:  Recent Labs Lab 11/24/16 0914  11/26/16 0435 11/27/16 0829 11/28/16 0535 11/29/16 0548 11/30/16 0348  NA  --   < > 142 142 141 140 138  K  --   < > 4.8 4.0 4.1 3.9 4.2  CL  --   < > 102 96* 87* 89* 88*   CO2  --   < > 33* 39* 45* 42* 41*  GLUCOSE  --   < > 226* 199* 249* 128* 261*  BUN  --   < > 19 20 26* 28* 27*  CREATININE  --   < > 0.67 0.66 0.74 0.73 0.82  CALCIUM  --   < > 8.7* 8.8* 9.0 9.1 9.1  MG 2.9*  --   --  2.0 2.4  --   --   < > = values in this interval not displayed. Liver Function Tests: No results for input(s): AST, ALT, ALKPHOS, BILITOT, PROT, ALBUMIN in the last 168 hours. No results for input(s): LIPASE, AMYLASE in the last 168 hours. No results for input(s): AMMONIA in the last 168 hours. CBC:  Recent Labs Lab 11/24/16 0301  11/26/16 0435 11/27/16 0829 11/28/16 0535 11/29/16 0548 11/30/16 0348  WBC 11.6*  < > 17.0* 16.1* 12.2* 12.2* 13.1*  NEUTROABS 7.4  --   --  14.9*  --   --   --   HGB 12.9  < > 10.9* 12.7 13.0 13.0 13.5  HCT 41.2  < > 35.5* 41.0 41.6 41.6 42.1  MCV 103.5*  < > 102.6* 102.8* 102.2* 101.2* 100.5*  PLT 232  < > 204 188 191 190 181  < > = values in this interval not displayed. Cardiac Enzymes: No results for input(s): CKTOTAL, CKMB, CKMBINDEX, TROPONINI in the last 168 hours. BNP: BNP (last 3 results)  Recent Labs  11/24/16 0301  BNP 46.4    ProBNP (last 3 results) No results for input(s): PROBNP in the last 8760 hours.  CBG:  Recent Labs Lab 11/29/16 0755 11/29/16 1200 11/29/16 1652 11/29/16 2145 11/30/16 0746  GLUCAP  192* 224* 166* 209* 134*        

## 2016-12-05 DIAGNOSIS — J449 Chronic obstructive pulmonary disease, unspecified: Secondary | ICD-10-CM | POA: Diagnosis not present

## 2016-12-05 DIAGNOSIS — E119 Type 2 diabetes mellitus without complications: Secondary | ICD-10-CM | POA: Diagnosis not present

## 2016-12-05 DIAGNOSIS — R739 Hyperglycemia, unspecified: Secondary | ICD-10-CM | POA: Diagnosis not present

## 2016-12-05 DIAGNOSIS — I509 Heart failure, unspecified: Secondary | ICD-10-CM | POA: Diagnosis not present

## 2016-12-06 ENCOUNTER — Telehealth: Payer: Self-pay | Admitting: Cardiovascular Disease

## 2016-12-06 NOTE — Telephone Encounter (Signed)
New Message:    She wants to know if her medical doctor or PA have contacted Dr Angelena Form? Pt says she have been in the hospital.

## 2016-12-06 NOTE — Telephone Encounter (Signed)
Patient is calling and wanted to know if her PCP Glendale Chard, MD or one of her Pas has spoken to Dr. Angelena Form about her being in the hospital from 2/22-2/28. She states that she wanted to let him know. The patient was also due for her 1 year, so an appointment was made on 12/12/16 with Dr. Angelena Form.

## 2016-12-08 DIAGNOSIS — E559 Vitamin D deficiency, unspecified: Secondary | ICD-10-CM | POA: Diagnosis not present

## 2016-12-08 DIAGNOSIS — I2510 Atherosclerotic heart disease of native coronary artery without angina pectoris: Secondary | ICD-10-CM | POA: Diagnosis not present

## 2016-12-08 DIAGNOSIS — E785 Hyperlipidemia, unspecified: Secondary | ICD-10-CM | POA: Diagnosis not present

## 2016-12-08 DIAGNOSIS — M949 Disorder of cartilage, unspecified: Secondary | ICD-10-CM | POA: Diagnosis not present

## 2016-12-08 DIAGNOSIS — Z7982 Long term (current) use of aspirin: Secondary | ICD-10-CM | POA: Diagnosis not present

## 2016-12-08 DIAGNOSIS — I110 Hypertensive heart disease with heart failure: Secondary | ICD-10-CM | POA: Diagnosis not present

## 2016-12-08 DIAGNOSIS — J441 Chronic obstructive pulmonary disease with (acute) exacerbation: Secondary | ICD-10-CM | POA: Diagnosis not present

## 2016-12-08 DIAGNOSIS — M899 Disorder of bone, unspecified: Secondary | ICD-10-CM | POA: Diagnosis not present

## 2016-12-08 DIAGNOSIS — I509 Heart failure, unspecified: Secondary | ICD-10-CM | POA: Diagnosis not present

## 2016-12-08 DIAGNOSIS — E1165 Type 2 diabetes mellitus with hyperglycemia: Secondary | ICD-10-CM | POA: Diagnosis not present

## 2016-12-08 DIAGNOSIS — Z794 Long term (current) use of insulin: Secondary | ICD-10-CM | POA: Diagnosis not present

## 2016-12-08 DIAGNOSIS — Z9981 Dependence on supplemental oxygen: Secondary | ICD-10-CM | POA: Diagnosis not present

## 2016-12-09 ENCOUNTER — Encounter: Payer: Self-pay | Admitting: Cardiovascular Disease

## 2016-12-12 ENCOUNTER — Telehealth: Payer: Self-pay | Admitting: Cardiovascular Disease

## 2016-12-12 ENCOUNTER — Ambulatory Visit: Payer: Medicare Other | Admitting: Cardiovascular Disease

## 2016-12-12 DIAGNOSIS — Z9181 History of falling: Secondary | ICD-10-CM | POA: Diagnosis not present

## 2016-12-12 DIAGNOSIS — M899 Disorder of bone, unspecified: Secondary | ICD-10-CM | POA: Diagnosis not present

## 2016-12-12 DIAGNOSIS — M949 Disorder of cartilage, unspecified: Secondary | ICD-10-CM | POA: Diagnosis not present

## 2016-12-12 DIAGNOSIS — E785 Hyperlipidemia, unspecified: Secondary | ICD-10-CM | POA: Diagnosis not present

## 2016-12-12 DIAGNOSIS — I110 Hypertensive heart disease with heart failure: Secondary | ICD-10-CM | POA: Diagnosis not present

## 2016-12-12 DIAGNOSIS — I509 Heart failure, unspecified: Secondary | ICD-10-CM | POA: Diagnosis not present

## 2016-12-12 DIAGNOSIS — Z87891 Personal history of nicotine dependence: Secondary | ICD-10-CM | POA: Diagnosis not present

## 2016-12-12 DIAGNOSIS — E559 Vitamin D deficiency, unspecified: Secondary | ICD-10-CM | POA: Diagnosis not present

## 2016-12-12 DIAGNOSIS — Z9981 Dependence on supplemental oxygen: Secondary | ICD-10-CM | POA: Diagnosis not present

## 2016-12-12 DIAGNOSIS — Z794 Long term (current) use of insulin: Secondary | ICD-10-CM | POA: Diagnosis not present

## 2016-12-12 DIAGNOSIS — I2510 Atherosclerotic heart disease of native coronary artery without angina pectoris: Secondary | ICD-10-CM | POA: Diagnosis not present

## 2016-12-12 DIAGNOSIS — Z7951 Long term (current) use of inhaled steroids: Secondary | ICD-10-CM | POA: Diagnosis not present

## 2016-12-12 DIAGNOSIS — Z7982 Long term (current) use of aspirin: Secondary | ICD-10-CM | POA: Diagnosis not present

## 2016-12-12 DIAGNOSIS — Z7952 Long term (current) use of systemic steroids: Secondary | ICD-10-CM | POA: Diagnosis not present

## 2016-12-12 DIAGNOSIS — E1165 Type 2 diabetes mellitus with hyperglycemia: Secondary | ICD-10-CM | POA: Diagnosis not present

## 2016-12-12 DIAGNOSIS — J441 Chronic obstructive pulmonary disease with (acute) exacerbation: Secondary | ICD-10-CM | POA: Diagnosis not present

## 2016-12-12 NOTE — Telephone Encounter (Signed)
I spoke with pt's daughter and scheduled pt to see Dr. Angelena Form on 12/16/16 at 2:30

## 2016-12-12 NOTE — Telephone Encounter (Signed)
Patient daughter called to r/s appt for today due to the weather. [name] would like to know if her mother could be worked in for Friday 12-16-16. Thanks.

## 2016-12-15 ENCOUNTER — Encounter: Payer: Self-pay | Admitting: Acute Care

## 2016-12-15 ENCOUNTER — Ambulatory Visit (INDEPENDENT_AMBULATORY_CARE_PROVIDER_SITE_OTHER): Payer: Medicare Other | Admitting: Acute Care

## 2016-12-15 DIAGNOSIS — J441 Chronic obstructive pulmonary disease with (acute) exacerbation: Secondary | ICD-10-CM

## 2016-12-15 MED ORDER — FLUTICASONE-SALMETEROL 250-50 MCG/DOSE IN AEPB: 1.0000 | INHALATION_SPRAY | Freq: Two times a day (BID) | RESPIRATORY_TRACT | 5 refills | Status: DC

## 2016-12-15 NOTE — Progress Notes (Signed)
History of Present Illness [name] is a 76 y.o. female former smoker with GOLD IV/ Group D copd , on home oxygen,  spiriva/advair , chronic hypoxic respiratory failure, coronary artery disease, stents, previous MI, and diabetes mellitus. She is followed by Dr. Annamaria Boots.    12/15/2016 Hospital Follow Up: Patient presents for hospital follow-up. She was admitted to 11/2216 through 11/30/2016 with acute on chronic respiratory failure with hypoxemia and hypercarbia due to COPD exacerbation and/or volume overload. She was treated with BiPAP, antibiotic therapy, IV steroids, oxygen therapy, scheduled bronchodilators, and diuretics. She improved and was able to wean back to her home oxygen at 1-2 L nasal cannula to maintain oxygen saturations greater than 88%. She was discharged on Brovana and Pulmicort nebulizer treatments, Atrovent and Xopenex nebulizer treatments, Spiriva, prednisone taper, Protonix, and encouraged to use incentive spirometry and flutter valve. Additionally , palliative care saw patient to help establish goals of care. She presents today stating that she has done well. Today is her last day of prednisone taper, and she feels it has helped her tremendously. She is currently on her home baseline dose of oxygen at 1-2 L nasal cannula. There was significant confusion regarding her medication regimen. We spent 10-15 minutes reviewing medications. She denies fever, chest pain, orthopnea, or hemoptysis. She remains deconditioned but is getting stronger each day. She has good support at home with her husband.   Tests 11/26/2016 CXR  FINDINGS: The heart size and mediastinal contours are within normal limits. Aortic atherosclerosis noted. Both lungs are clear. The visualized skeletal structures are unremarkable.  IMPRESSION: No active disease.  Past medical hx Past Medical History:  Diagnosis Date  . CAD (coronary artery disease)    Stent RCA 2005, stent LAD 2006  . Carotid artery  occlusion   . COPD (chronic obstructive pulmonary disease) ([place])   . Depression   . Diabetes mellitus without complication ([place])   . Fatigue   . Heart murmur   . Hyperlipemia   . Hypertension   . Myocardial infarction   . Recurrent upper respiratory infection (URI)   . Shortness of breath   . Tobacco abuse      Past surgical hx, Family hx, Social hx all reviewed.  Current Outpatient Prescriptions on File Prior to Visit  Medication Sig  . amitriptyline (ELAVIL) 10 MG tablet 1 daily at bedtime  . arformoterol (BROVANA) 15 MCG/2ML NEBU Take 2 mLs (15 mcg total) by nebulization 2 (two) times daily.  . Ascorbic Acid (VITAMIN C) 1000 MG tablet Take 1,000 mg by mouth daily.  Marland Kitchen aspirin EC 81 MG tablet Take 81 mg by mouth at bedtime.   . Biotin (BIOTIN MAXIMUM STRENGTH) 10 MG TABS Take 10 mg by mouth daily. 10,000 mcg  . budesonide (PULMICORT) 0.5 MG/2ML nebulizer solution Take 2 mLs (0.5 mg total) by nebulization 2 (two) times daily.  . Cholecalciferol (VITAMIN D) 2000 units tablet Take 2,000 Units by mouth daily.  . clonazePAM (KLONOPIN) 1 MG tablet Take 1 tablet (1 mg total) by mouth at bedtime.  . famotidine (PEPCID) 20 MG tablet Take 1 tablet (20 mg total) by mouth daily.  . fluticasone (FLONASE) 50 MCG/ACT nasal spray Place 2 sprays into both nostrils daily.  . furosemide (LASIX) 40 MG tablet Take 1 tablet (40 mg total) by mouth daily.  Marland Kitchen gabapentin (NEURONTIN) 100 MG capsule Take 1 capsule (100 mg total) by mouth 3 (three) times daily as needed (pain).  Marland Kitchen guaiFENesin (MUCINEX) 600 MG 12 hr  tablet Take 1 tablet (600 mg total) by mouth 2 (two) times daily.  . insulin aspart (NOVOLOG FLEXPEN) 100 UNIT/ML FlexPen Sliding scale insulin  CBG 121 - 150: 3 units  CBG 151 - 200: 4 units  CBG 201 - 250: 7 units  CBG 251 - 300: 11 units  CBG 301 - 350: 15 units  CBG 351 - 400: 20 units  . Insulin Glargine (LANTUS) 100 UNIT/ML Solostar Pen Inject 10 Units into the skin daily.  Marland Kitchen ipratropium  (ATROVENT) 0.02 % nebulizer solution Take 2.5 mLs (0.5 mg total) by nebulization 2 (two) times daily.  Marland Kitchen levalbuterol (XOPENEX) 0.63 MG/3ML nebulizer solution Take 3 mLs (0.63 mg total) by nebulization every 2 (two) hours as needed for wheezing or shortness of breath.  . loratadine (CLARITIN) 10 MG tablet Take 1 tablet (10 mg total) by mouth daily.  . Melatonin 5 MG TABS Take 5 mg by mouth at bedtime.  . Multiple Vitamin (MULTIVITAMIN WITH MINERALS) TABS tablet Take 1 tablet by mouth daily.  . nitroGLYCERIN (NITROSTAT) 0.4 MG SL tablet Place 1 tablet (0.4 mg total) under the tongue every 5 (five) minutes as needed for chest pain.  . OXYGEN Inhale into the lungs continuous. 2 1/2 - 3 L  . pantoprazole (PROTONIX) 40 MG tablet Take 1 tablet (40 mg total) by mouth 2 (two) times daily before a meal.  . potassium chloride (K-DUR) 10 MEQ tablet Take 1 tablet (10 mEq total) by mouth daily.  . predniSONE (DELTASONE) 10 MG tablet Take 4 tablets (total 40 mg) for 4 days from 2/28 through 3/3 Take 3 tablets (total 30 mg) for 4 days from 3/4 through 3/7 Take 2 tablets (total 20 mg) for 4 days from 3/8 through 3/11 Take 1 tablet (10 mg) for 4 days from 3/12 thorough 3/15  . SPIRIVA HANDIHALER 18 MCG inhalation capsule INHALE 1 CAPSULE DAILY  . senna-docusate (SENOKOT-S) 8.6-50 MG tablet Take 1 tablet by mouth at bedtime as needed for mild constipation. (Patient not taking: Reported on 12/15/2016)   No current facility-administered medications on file prior to visit.      Allergies  Allergen Reactions  . Bactrim [Sulfamethoxazole-Trimethoprim] Nausea And Vomiting  . Codeine Other (See Comments)    Unknown reaction but cannot take per husband  . Vytorin [Ezetimibe-Simvastatin] Other (See Comments)    Reaction: possibly malaise per husband    Review Of Systems:  Constitutional:   No  weight loss, night sweats,  Fevers, chills, fatigue, or  lassitude.  HEENT:   No headaches,  Difficulty swallowing,   Tooth/dental problems, or  Sore throat,                No sneezing, itching, ear ache, nasal congestion, post nasal drip,   CV:  No chest pain,  Orthopnea, PND, swelling in lower extremities, anasarca, dizziness, palpitations, syncope.   GI  No heartburn, indigestion, abdominal pain, nausea, vomiting, diarrhea, change in bowel habits, loss of appetite, bloody stools.   Resp: + shortness of breath with exertion or at rest.  No excess mucus, no productive cough,  No non-productive cough,  No coughing up of blood.  No change in color of mucus.  No wheezing.  No chest wall deformity  Skin: no rash or lesions.  GU: no dysuria, change in color of urine, no urgency or frequency.  No flank pain, no hematuria   MS:  No joint pain or swelling.  No decreased range of motion.  No back pain.  Psych:  No change in mood or affect. No depression or anxiety.  No memory loss.   Vital Signs BP (!) 146/60 (BP Location: Left Arm, Patient Position: Sitting, Cuff Size: Normal)   Pulse 93   Ht 5\' 1"  (1.549 m)   Wt 118 lb 3.2 oz (53.6 kg)   SpO2 95%   BMI 22.33 kg/m    Physical Exam:  General- No distress,  A&Ox3, pleasant thin female wearing nasal cannula ENT: No sinus tenderness, TM clear, pale nasal mucosa, no oral exudate,no post nasal drip, no LAN Cardiac: S1, S2, regular rate and rhythm, no murmur Chest: Faint expiratory wheeze/ rales/ dullness; no accessory muscle use, no nasal flaring, no sternal retractions Abd.: Soft Non-tender, soft and flat, BS+ Ext: No clubbing cyanosis, trace edema Neuro:  Deconditioned and frail at baseline, alert and oriented 3, moving all extremities 4 Skin: No rashes, warm and dry, thin fragile skin Psych: normal mood and behavior   Assessment/Plan  COPD with acute exacerbation (HCC)  Resolving acute exacerbation after hospitalization Plan Use your flutter valve 4 puffs 4 times daily. Continue your Neb treatments as you were doing in the hospital twice  daily until gone.  Then you can resume your albuterol nebs twice daily as you were doing Prior to admission.  When you finish with the hospital nebs, please resume Advair daily. Titrate your oxygen for saturations of 88-93% Continue your Spiriva daily. Rinse mouth after use. Follow up with Dr. Annamaria Boots in 2 months. Please contact office for sooner follow up if symptoms do not improve or worsen or seek emergency care    Chronic Hypoxemia Plan: Wear oxygen at 1-2 L to maintain oxygen saturations between 88 and 93%  Magdalen Spatz, NP 12/15/2016  6:38 PM

## 2016-12-15 NOTE — Assessment & Plan Note (Signed)
  Resolving acute exacerbation after hospitalization Plan Use your flutter valve 4 puffs 4 times daily. Continue your Neb treatments as you were doing in the hospital twice daily until gone.  Then you can resume your albuterol nebs twice daily as you were doing Prior to admission.  When you finish with the hospital nebs, please resume Advair daily. Titrate your oxygen for saturations of 88-93% Continue your Spiriva daily. Rinse mouth after use. Follow up with Dr. Annamaria Boots in 2 months. Please contact office for sooner follow up if symptoms do not improve or worsen or seek emergency care

## 2016-12-15 NOTE — Patient Instructions (Addendum)
It is good to see you today. Use your flutter valve 4 puffs 4 times daily. Continue your Neb treatments as you were doing in the hospital twice daily until gone.  Then you can resume your albuterol nebs twice daily as you were doing Prior to admission.  When you finish with the hospital nebs, please resume Advair daily. Titrate your oxygen for saturations of 88-93% Continue your Spiriva daily. Rinse mouth after use. Follow up with Dr. Annamaria Boots in 2 months. Please contact office for sooner follow up if symptoms do not improve or worsen or seek emergency care

## 2016-12-16 ENCOUNTER — Ambulatory Visit (INDEPENDENT_AMBULATORY_CARE_PROVIDER_SITE_OTHER): Payer: Medicare Other | Admitting: Cardiovascular Disease

## 2016-12-16 ENCOUNTER — Encounter: Payer: Self-pay | Admitting: Cardiovascular Disease

## 2016-12-16 VITALS — BP 134/72 | HR 90 | Ht 61.0 in | Wt 119.0 lb

## 2016-12-16 DIAGNOSIS — Z7951 Long term (current) use of inhaled steroids: Secondary | ICD-10-CM | POA: Diagnosis not present

## 2016-12-16 DIAGNOSIS — J441 Chronic obstructive pulmonary disease with (acute) exacerbation: Secondary | ICD-10-CM | POA: Diagnosis not present

## 2016-12-16 DIAGNOSIS — M899 Disorder of bone, unspecified: Secondary | ICD-10-CM | POA: Diagnosis not present

## 2016-12-16 DIAGNOSIS — I110 Hypertensive heart disease with heart failure: Secondary | ICD-10-CM | POA: Diagnosis not present

## 2016-12-16 DIAGNOSIS — I779 Disorder of arteries and arterioles, unspecified: Secondary | ICD-10-CM

## 2016-12-16 DIAGNOSIS — I5032 Chronic diastolic (congestive) heart failure: Secondary | ICD-10-CM | POA: Diagnosis not present

## 2016-12-16 DIAGNOSIS — F17201 Nicotine dependence, unspecified, in remission: Secondary | ICD-10-CM | POA: Diagnosis not present

## 2016-12-16 DIAGNOSIS — Z7982 Long term (current) use of aspirin: Secondary | ICD-10-CM | POA: Diagnosis not present

## 2016-12-16 DIAGNOSIS — E785 Hyperlipidemia, unspecified: Secondary | ICD-10-CM | POA: Diagnosis not present

## 2016-12-16 DIAGNOSIS — I739 Peripheral vascular disease, unspecified: Principal | ICD-10-CM

## 2016-12-16 DIAGNOSIS — Z9181 History of falling: Secondary | ICD-10-CM | POA: Diagnosis not present

## 2016-12-16 DIAGNOSIS — E559 Vitamin D deficiency, unspecified: Secondary | ICD-10-CM | POA: Diagnosis not present

## 2016-12-16 DIAGNOSIS — I10 Essential (primary) hypertension: Secondary | ICD-10-CM | POA: Diagnosis not present

## 2016-12-16 DIAGNOSIS — E1165 Type 2 diabetes mellitus with hyperglycemia: Secondary | ICD-10-CM | POA: Diagnosis not present

## 2016-12-16 DIAGNOSIS — I2510 Atherosclerotic heart disease of native coronary artery without angina pectoris: Secondary | ICD-10-CM | POA: Diagnosis not present

## 2016-12-16 DIAGNOSIS — Z794 Long term (current) use of insulin: Secondary | ICD-10-CM | POA: Diagnosis not present

## 2016-12-16 DIAGNOSIS — M949 Disorder of cartilage, unspecified: Secondary | ICD-10-CM | POA: Diagnosis not present

## 2016-12-16 DIAGNOSIS — I509 Heart failure, unspecified: Secondary | ICD-10-CM | POA: Diagnosis not present

## 2016-12-16 DIAGNOSIS — Z7952 Long term (current) use of systemic steroids: Secondary | ICD-10-CM | POA: Diagnosis not present

## 2016-12-16 DIAGNOSIS — Z9981 Dependence on supplemental oxygen: Secondary | ICD-10-CM | POA: Diagnosis not present

## 2016-12-16 DIAGNOSIS — Z87891 Personal history of nicotine dependence: Secondary | ICD-10-CM | POA: Diagnosis not present

## 2016-12-16 MED ORDER — POTASSIUM CHLORIDE ER 10 MEQ PO TBCR: 10.0000 meq | EXTENDED_RELEASE_TABLET | Freq: Every day | ORAL | 3 refills | Status: DC | PRN

## 2016-12-16 MED ORDER — FUROSEMIDE 40 MG PO TABS: 40.0000 mg | ORAL_TABLET | Freq: Every day | ORAL | 3 refills | Status: DC | PRN

## 2016-12-16 NOTE — Progress Notes (Signed)
Chief Complaint  Patient presents with  . Follow-up    1 year follow up. SHOB with activity.     History of Present Illness: 76 yo WF with prior history of HTN, HLD, COPD and CAD who is here today for cardiac follow up. She had stenting of the RCA in 2005 and the LAD in 2006. Last cardiac cath in October 2011 with patent stents in the LAD and RCA with nonobstructive disease throughout. It was felt the majority of her symptoms were probably due to COPD and she was counseled to stop smoking. She was sent home on Imdur. The patient said she became extremely dizzy after she took one dose and had to stop it. She has been followed by Dr. Annamaria Boots and has been diagnosed with severe COPD. She has not tolerated beta blockers due to fatigue. She has not tolerated statins due to arm and leg pains. Carotid dopplers July 2017 with moderate carotid disease. Stress myoview July 2016 with no ischemia, large scar. Admitted to [facility] February 2018 with COPD exacerbation. Volume overload due to over hydration. Discharged on Lasix.   She is here today for follow up. No chest pain. No change in weight. No palpitations. No LE edema. Breathing is at baseline.   Primary Care Physician: Maximino Greenland, MD   Past Medical History:  Diagnosis Date  . CAD (coronary artery disease)    Stent RCA 2005, stent LAD 2006  . Carotid artery occlusion   . COPD (chronic obstructive pulmonary disease) ([place])   . Depression   . Diabetes mellitus without complication ([place])   . Fatigue   . Heart murmur   . Hyperlipemia   . Hypertension   . Myocardial infarction   . Recurrent upper respiratory infection (URI)   . Shortness of breath   . Tobacco abuse     Past Surgical History:  Procedure Laterality Date  . CARDIAC CATHETERIZATION  2011  . coronary angiography  Nov 25, 2004   CYPHER stenting, left anterior descending  . CORONARY STENT PLACEMENT     drug-eluting; right coronary artery  . VESICOVAGINAL FISTULA CLOSURE W/ TAH       Current Outpatient Prescriptions  Medication Sig Dispense Refill  . amitriptyline (ELAVIL) 10 MG tablet 1 daily at bedtime 30 tablet 0  . arformoterol (BROVANA) 15 MCG/2ML NEBU Take 2 mLs (15 mcg total) by nebulization 2 (two) times daily. 120 mL 0  . Ascorbic Acid (VITAMIN C) 1000 MG tablet Take 1,000 mg by mouth daily.    Marland Kitchen aspirin EC 81 MG tablet Take 81 mg by mouth at bedtime.     . Biotin (BIOTIN MAXIMUM STRENGTH) 10 MG TABS Take 10 mg by mouth daily. 10,000 mcg    . budesonide (PULMICORT) 0.5 MG/2ML nebulizer solution Take 2 mLs (0.5 mg total) by nebulization 2 (two) times daily. 100 mL 1  . Cholecalciferol (VITAMIN D) 2000 units tablet Take 2,000 Units by mouth daily.    . clonazePAM (KLONOPIN) 1 MG tablet Take 1 tablet (1 mg total) by mouth at bedtime. 10 tablet 0  . famotidine (PEPCID) 20 MG tablet Take 1 tablet (20 mg total) by mouth daily. 30 tablet 0  . fluticasone (FLONASE) 50 MCG/ACT nasal spray Place 2 sprays into both nostrils daily. 16 g 2  . Fluticasone-Salmeterol (ADVAIR DISKUS) 250-50 MCG/DOSE AEPB Inhale 1 puff into the lungs 2 (two) times daily. 60 each 5  . furosemide (LASIX) 40 MG tablet Take 1 tablet (40 mg total) by  mouth daily. 30 tablet 0  . gabapentin (NEURONTIN) 100 MG capsule Take 1 capsule (100 mg total) by mouth 3 (three) times daily as needed (pain). 90 capsule 0  . guaiFENesin (MUCINEX) 600 MG 12 hr tablet Take 1 tablet (600 mg total) by mouth 2 (two) times daily.    . insulin aspart (NOVOLOG FLEXPEN) 100 UNIT/ML FlexPen Sliding scale insulin  CBG 121 - 150: 3 units  CBG 151 - 200: 4 units  CBG 201 - 250: 7 units  CBG 251 - 300: 11 units  CBG 301 - 350: 15 units  CBG 351 - 400: 20 units 15 mL 0  . Insulin Glargine (LANTUS) 100 UNIT/ML Solostar Pen Inject 10 Units into the skin daily. 15 mL 1  . ipratropium (ATROVENT) 0.02 % nebulizer solution Take 2.5 mLs (0.5 mg total) by nebulization 2 (two) times daily. 75 mL 2  . levalbuterol (XOPENEX) 0.63  MG/3ML nebulizer solution Take 3 mLs (0.63 mg total) by nebulization every 2 (two) hours as needed for wheezing or shortness of breath. 3 mL 1  . loratadine (CLARITIN) 10 MG tablet Take 1 tablet (10 mg total) by mouth daily. 30 tablet 0  . Melatonin 5 MG TABS Take 5 mg by mouth at bedtime.    . Multiple Vitamin (MULTIVITAMIN WITH MINERALS) TABS tablet Take 1 tablet by mouth daily.    . nitroGLYCERIN (NITROSTAT) 0.4 MG SL tablet Place 1 tablet (0.4 mg total) under the tongue every 5 (five) minutes as needed for chest pain. 25 tablet 6  . OXYGEN Inhale into the lungs continuous. 2 1/2 - 3 L    . pantoprazole (PROTONIX) 40 MG tablet Take 1 tablet (40 mg total) by mouth 2 (two) times daily before a meal. 60 tablet 0  . potassium chloride (K-DUR) 10 MEQ tablet Take 1 tablet (10 mEq total) by mouth daily. 30 tablet 0  . predniSONE (DELTASONE) 10 MG tablet Take 4 tablets (total 40 mg) for 4 days from 2/28 through 3/3 Take 3 tablets (total 30 mg) for 4 days from 3/4 through 3/7 Take 2 tablets (total 20 mg) for 4 days from 3/8 through 3/11 Take 1 tablet (10 mg) for 4 days from 3/12 thorough 3/15 40 tablet 0  . senna-docusate (SENOKOT-S) 8.6-50 MG tablet Take 1 tablet by mouth at bedtime as needed for mild constipation. 15 tablet 0  . SPIRIVA HANDIHALER 18 MCG inhalation capsule INHALE 1 CAPSULE DAILY 30 capsule 8   No current facility-administered medications for this visit.     Allergies  Allergen Reactions  . Bactrim [Sulfamethoxazole-Trimethoprim] Nausea And Vomiting  . Codeine Other (See Comments)    Unknown reaction but cannot take per husband  . Vytorin [Ezetimibe-Simvastatin] Other (See Comments)    Reaction: possibly malaise per husband    Social History   Social History  . Marital status: Married    Spouse name: N/A  . Number of children: 3  . Years of education: N/A   Occupational History  . Retired Administrator   Social History Main Topics  . Smoking status:  Former Smoker    Packs/day: 0.25    Years: 50.00    Types: Cigarettes    Quit date: 02/15/2012  . Smokeless tobacco: Never Used     Comment: started smoking at age 89/// Pt is currently trying to quit smoking, using the electronic cigs, not longer smoking tobacco  . Alcohol use Yes     Comment: occasional wine  .  Drug use: No  . Sexual activity: Not Currently    Birth control/ protection: Post-menopausal   Other Topics Concern  . Not on file   Social History Narrative   Husband smokes cigars    Family History  Problem Relation Age of Onset  . Diabetes Mother     deceased  . Heart disease Mother   . Alzheimer's disease Father     deceased  . Diabetes Sister   . Heart attack Sister     Review of Systems:  As stated in the HPI and otherwise negative.   BP 134/72   Pulse 90   Ht 5\' 1"  (1.549 m)   Wt 119 lb (54 kg)   SpO2 96%   BMI 22.48 kg/m   Physical Examination: General: Well developed, well nourished, NAD  HEENT: OP clear, mucus membranes moist  SKIN: warm, dry. No rashes. Neuro: No focal deficits  Musculoskeletal: Muscle strength 5/5 all ext  Psychiatric: Mood and affect normal  Neck: No JVD, no carotid bruits, no thyromegaly, no lymphadenopathy.  Lungs:Clear bilaterally, no wheezes, rhonci, crackles Cardiovascular: Regular rate and rhythm. No murmurs, gallops or rubs. Abdomen:Soft. Bowel sounds present. Non-tender.  Extremities: No lower extremity edema. Pulses are 2 + in the bilateral DP/PT.  Echo February 2018: Left ventricle: The cavity size was normal. Wall thickness was   increased in a pattern of moderate LVH. Systolic function was   normal. The estimated ejection fraction was in the range of 50%   to 55%. Doppler parameters are consistent with abnormal left   ventricular relaxation (grade 1 diastolic dysfunction). Doppler   parameters are consistent with high ventricular filling pressure. - Aortic valve: Mildly calcified annulus. Mildly thickened    leaflets. Valve area (VTI): 2.02 cm^2. Valve area (Vmax): 2.21   cm^2. Valve area (Vmean): 2.11 cm^2. - Mitral valve: There was mild regurgitation. - Technically difficult study.  EKG:  EKG is not ordered today. The ekg ordered today demonstrates   Recent Labs: 11/24/2016: B Natriuretic Peptide 46.4 11/28/2016: Magnesium 2.4 11/30/2016: BUN 27; Creatinine, Ser 0.82; Hemoglobin 13.5; Platelets 181; Potassium 4.2; Sodium 138   Lipid Panel    Component Value Date/Time   CHOL 149 08/17/2012 1059   TRIG 124 10/12/2015 1315   HDL 51.70 08/17/2012 1059   CHOLHDL 3 08/17/2012 1059   VLDL 45.4 (H) 08/17/2012 1059   LDLDIRECT 67.3 08/17/2012 1059     Wt Readings from Last 3 Encounters:  12/16/16 119 lb (54 kg)  12/15/16 118 lb 3.2 oz (53.6 kg)  11/30/16 111 lb 12.8 oz (50.7 kg)     Other studies Reviewed: Additional studies/ records that were reviewed today include: . Review of the above records demonstrates:    Assessment and Plan:   1. TOBACCO ABUSE, in remission: She has stopped smoking in May 2013. She has severe COPD.  2. CAD without angina: No recent chest pain concerning for angina. Nuclear stress test July 2016 with no ischemia. Continue ASA. She does not tolerate statins or beta blockers.   3. CAROTID ARTERY DISEASE : Stable by dopplers July 2017 (40-59% RICA stenosis, 8-28% LICA stenosis). Will repeat in July 2019  4. HTN: BP controlled. No changes   5. Chronic diastolic CHF Volume status is ok. I think she was overloaded due to hydration in the hospital. Will hold Lasix and potassium. She will use Lasix prn for weight gain, LE edema.   Current medicines are reviewed at length with the patient today.  The patient  does not have concerns regarding medicines.  The following changes have been made:  no change  Labs/ tests ordered today include:   No orders of the defined types were placed in this encounter.   Disposition:   FU with me  in 8  weeks  Signed, Lauree Chandler, MD 12/16/2016 3:58 PM    [facility] [place], [place], [place]  [address] Phone: [phone]; Fax: [phone]

## 2016-12-16 NOTE — Patient Instructions (Signed)
Medication Instructions:  Your physician has recommended you make the following change in your medication:  Change furosemide and potassium to as needed   Labwork: none  Testing/Procedures: none  Follow-Up: Your physician recommends that you schedule a follow-up appointment in: 12 months.  Please call our office in about 9 months to schedule this appointment.     Any Other Special Instructions Will Be Listed Below (If Applicable).     If you need a refill on your cardiac medications before your next appointment, please call your pharmacy.  Marland Kitchen

## 2016-12-16 NOTE — Addendum Note (Signed)
Addended by: Thompson Grayer on: 12/16/2016 04:06 PM   Modules accepted: Orders

## 2016-12-19 DIAGNOSIS — L659 Nonscarring hair loss, unspecified: Secondary | ICD-10-CM | POA: Diagnosis not present

## 2016-12-19 DIAGNOSIS — I10 Essential (primary) hypertension: Secondary | ICD-10-CM | POA: Diagnosis not present

## 2016-12-19 DIAGNOSIS — R739 Hyperglycemia, unspecified: Secondary | ICD-10-CM | POA: Diagnosis not present

## 2016-12-19 DIAGNOSIS — Z87891 Personal history of nicotine dependence: Secondary | ICD-10-CM | POA: Diagnosis not present

## 2016-12-19 DIAGNOSIS — R609 Edema, unspecified: Secondary | ICD-10-CM | POA: Diagnosis not present

## 2016-12-19 DIAGNOSIS — E119 Type 2 diabetes mellitus without complications: Secondary | ICD-10-CM | POA: Diagnosis not present

## 2016-12-20 DIAGNOSIS — Z794 Long term (current) use of insulin: Secondary | ICD-10-CM | POA: Diagnosis not present

## 2016-12-20 DIAGNOSIS — E559 Vitamin D deficiency, unspecified: Secondary | ICD-10-CM | POA: Diagnosis not present

## 2016-12-20 DIAGNOSIS — J441 Chronic obstructive pulmonary disease with (acute) exacerbation: Secondary | ICD-10-CM | POA: Diagnosis not present

## 2016-12-20 DIAGNOSIS — I110 Hypertensive heart disease with heart failure: Secondary | ICD-10-CM | POA: Diagnosis not present

## 2016-12-20 DIAGNOSIS — E1165 Type 2 diabetes mellitus with hyperglycemia: Secondary | ICD-10-CM | POA: Diagnosis not present

## 2016-12-20 DIAGNOSIS — M949 Disorder of cartilage, unspecified: Secondary | ICD-10-CM | POA: Diagnosis not present

## 2016-12-20 DIAGNOSIS — I2510 Atherosclerotic heart disease of native coronary artery without angina pectoris: Secondary | ICD-10-CM | POA: Diagnosis not present

## 2016-12-20 DIAGNOSIS — Z87891 Personal history of nicotine dependence: Secondary | ICD-10-CM | POA: Diagnosis not present

## 2016-12-20 DIAGNOSIS — Z9981 Dependence on supplemental oxygen: Secondary | ICD-10-CM | POA: Diagnosis not present

## 2016-12-20 DIAGNOSIS — Z9181 History of falling: Secondary | ICD-10-CM | POA: Diagnosis not present

## 2016-12-20 DIAGNOSIS — M899 Disorder of bone, unspecified: Secondary | ICD-10-CM | POA: Diagnosis not present

## 2016-12-20 DIAGNOSIS — Z7982 Long term (current) use of aspirin: Secondary | ICD-10-CM | POA: Diagnosis not present

## 2016-12-20 DIAGNOSIS — I509 Heart failure, unspecified: Secondary | ICD-10-CM | POA: Diagnosis not present

## 2016-12-20 DIAGNOSIS — E785 Hyperlipidemia, unspecified: Secondary | ICD-10-CM | POA: Diagnosis not present

## 2016-12-20 DIAGNOSIS — Z7951 Long term (current) use of inhaled steroids: Secondary | ICD-10-CM | POA: Diagnosis not present

## 2016-12-20 DIAGNOSIS — Z7952 Long term (current) use of systemic steroids: Secondary | ICD-10-CM | POA: Diagnosis not present

## 2016-12-22 ENCOUNTER — Telehealth: Payer: Self-pay | Admitting: Internal Medicine

## 2016-12-22 DIAGNOSIS — Z7952 Long term (current) use of systemic steroids: Secondary | ICD-10-CM | POA: Diagnosis not present

## 2016-12-22 DIAGNOSIS — Z9181 History of falling: Secondary | ICD-10-CM | POA: Diagnosis not present

## 2016-12-22 DIAGNOSIS — J441 Chronic obstructive pulmonary disease with (acute) exacerbation: Secondary | ICD-10-CM | POA: Diagnosis not present

## 2016-12-22 DIAGNOSIS — E785 Hyperlipidemia, unspecified: Secondary | ICD-10-CM | POA: Diagnosis not present

## 2016-12-22 DIAGNOSIS — I509 Heart failure, unspecified: Secondary | ICD-10-CM | POA: Diagnosis not present

## 2016-12-22 DIAGNOSIS — M949 Disorder of cartilage, unspecified: Secondary | ICD-10-CM | POA: Diagnosis not present

## 2016-12-22 DIAGNOSIS — I110 Hypertensive heart disease with heart failure: Secondary | ICD-10-CM | POA: Diagnosis not present

## 2016-12-22 DIAGNOSIS — Z794 Long term (current) use of insulin: Secondary | ICD-10-CM | POA: Diagnosis not present

## 2016-12-22 DIAGNOSIS — E1165 Type 2 diabetes mellitus with hyperglycemia: Secondary | ICD-10-CM | POA: Diagnosis not present

## 2016-12-22 DIAGNOSIS — Z9981 Dependence on supplemental oxygen: Secondary | ICD-10-CM | POA: Diagnosis not present

## 2016-12-22 DIAGNOSIS — E559 Vitamin D deficiency, unspecified: Secondary | ICD-10-CM | POA: Diagnosis not present

## 2016-12-22 DIAGNOSIS — Z7982 Long term (current) use of aspirin: Secondary | ICD-10-CM | POA: Diagnosis not present

## 2016-12-22 DIAGNOSIS — I2510 Atherosclerotic heart disease of native coronary artery without angina pectoris: Secondary | ICD-10-CM | POA: Diagnosis not present

## 2016-12-22 DIAGNOSIS — M899 Disorder of bone, unspecified: Secondary | ICD-10-CM | POA: Diagnosis not present

## 2016-12-22 DIAGNOSIS — Z87891 Personal history of nicotine dependence: Secondary | ICD-10-CM | POA: Diagnosis not present

## 2016-12-22 DIAGNOSIS — Z7951 Long term (current) use of inhaled steroids: Secondary | ICD-10-CM | POA: Diagnosis not present

## 2016-12-22 MED ORDER — PREDNISONE 5 MG PO TABS: ORAL_TABLET | ORAL | 0 refills | Status: DC

## 2016-12-22 NOTE — Telephone Encounter (Signed)
This might just be effects of steroid withdrawal  Suggest prednisone 5 mg tabs, # 25  Take 2 daily x 3 days, then one daily x 3 days, then one every other day

## 2016-12-22 NOTE — Telephone Encounter (Signed)
Pt c/o body aches, chills, sometimes prod cough with green mucus.  Denies fever, sinus congestion, chest pain.  s/s started after stopping pred taper on Friday.    Pt taking otc antihistamine, tylenol to help with symptoms.   Pt uses [facility] on [address].    CY please advise on further recs.  Thanks!   Allergies  Allergen Reactions  . Bactrim [Sulfamethoxazole-Trimethoprim] Nausea And Vomiting  . Codeine Other (See Comments)    Unknown reaction but cannot take per husband  . Vytorin [Ezetimibe-Simvastatin] Other (See Comments)    Reaction: possibly malaise per husband   Current Outpatient Prescriptions on File Prior to Visit  Medication Sig Dispense Refill  . amitriptyline (ELAVIL) 10 MG tablet 1 daily at bedtime 30 tablet 0  . arformoterol (BROVANA) 15 MCG/2ML NEBU Take 2 mLs (15 mcg total) by nebulization 2 (two) times daily. 120 mL 0  . Ascorbic Acid (VITAMIN C) 1000 MG tablet Take 1,000 mg by mouth daily.    Marland Kitchen aspirin EC 81 MG tablet Take 81 mg by mouth at bedtime.     . Biotin (BIOTIN MAXIMUM STRENGTH) 10 MG TABS Take 10 mg by mouth daily. 10,000 mcg    . budesonide (PULMICORT) 0.5 MG/2ML nebulizer solution Take 2 mLs (0.5 mg total) by nebulization 2 (two) times daily. 100 mL 1  . Cholecalciferol (VITAMIN D) 2000 units tablet Take 2,000 Units by mouth daily.    . clonazePAM (KLONOPIN) 1 MG tablet Take 1 tablet (1 mg total) by mouth at bedtime. 10 tablet 0  . famotidine (PEPCID) 20 MG tablet Take 1 tablet (20 mg total) by mouth daily. 30 tablet 0  . fluticasone (FLONASE) 50 MCG/ACT nasal spray Place 2 sprays into both nostrils daily. 16 g 2  . Fluticasone-Salmeterol (ADVAIR DISKUS) 250-50 MCG/DOSE AEPB Inhale 1 puff into the lungs 2 (two) times daily. 60 each 5  . furosemide (LASIX) 40 MG tablet Take 1 tablet (40 mg total) by mouth daily as needed. 90 tablet 3  . gabapentin (NEURONTIN) 100 MG capsule Take 1 capsule (100 mg total) by mouth 3 (three) times daily as needed (pain). 90  capsule 0  . guaiFENesin (MUCINEX) 600 MG 12 hr tablet Take 1 tablet (600 mg total) by mouth 2 (two) times daily.    . insulin aspart (NOVOLOG FLEXPEN) 100 UNIT/ML FlexPen Sliding scale insulin  CBG 121 - 150: 3 units  CBG 151 - 200: 4 units  CBG 201 - 250: 7 units  CBG 251 - 300: 11 units  CBG 301 - 350: 15 units  CBG 351 - 400: 20 units 15 mL 0  . Insulin Glargine (LANTUS) 100 UNIT/ML Solostar Pen Inject 10 Units into the skin daily. 15 mL 1  . ipratropium (ATROVENT) 0.02 % nebulizer solution Take 2.5 mLs (0.5 mg total) by nebulization 2 (two) times daily. 75 mL 2  . levalbuterol (XOPENEX) 0.63 MG/3ML nebulizer solution Take 3 mLs (0.63 mg total) by nebulization every 2 (two) hours as needed for wheezing or shortness of breath. 3 mL 1  . loratadine (CLARITIN) 10 MG tablet Take 1 tablet (10 mg total) by mouth daily. 30 tablet 0  . Melatonin 5 MG TABS Take 5 mg by mouth at bedtime.    . Multiple Vitamin (MULTIVITAMIN WITH MINERALS) TABS tablet Take 1 tablet by mouth daily.    . nitroGLYCERIN (NITROSTAT) 0.4 MG SL tablet Place 1 tablet (0.4 mg total) under the tongue every 5 (five) minutes as needed for chest pain.  25 tablet 6  . OXYGEN Inhale into the lungs continuous. 2 1/2 - 3 L    . pantoprazole (PROTONIX) 40 MG tablet Take 1 tablet (40 mg total) by mouth 2 (two) times daily before a meal. 60 tablet 0  . potassium chloride (K-DUR) 10 MEQ tablet Take 1 tablet (10 mEq total) by mouth daily as needed. 90 tablet 3  . predniSONE (DELTASONE) 10 MG tablet Take 4 tablets (total 40 mg) for 4 days from 2/28 through 3/3 Take 3 tablets (total 30 mg) for 4 days from 3/4 through 3/7 Take 2 tablets (total 20 mg) for 4 days from 3/8 through 3/11 Take 1 tablet (10 mg) for 4 days from 3/12 thorough 3/15 40 tablet 0  . senna-docusate (SENOKOT-S) 8.6-50 MG tablet Take 1 tablet by mouth at bedtime as needed for mild constipation. 15 tablet 0  . SPIRIVA HANDIHALER 18 MCG inhalation capsule INHALE 1 CAPSULE  DAILY 30 capsule 8   No current facility-administered medications on file prior to visit.

## 2016-12-22 NOTE — Telephone Encounter (Signed)
Spoke with pt, aware of recs.  rx sent to preferred pharmacy.  Nothing further needed.  

## 2016-12-27 DIAGNOSIS — Z794 Long term (current) use of insulin: Secondary | ICD-10-CM | POA: Diagnosis not present

## 2016-12-27 DIAGNOSIS — I509 Heart failure, unspecified: Secondary | ICD-10-CM | POA: Diagnosis not present

## 2016-12-27 DIAGNOSIS — I110 Hypertensive heart disease with heart failure: Secondary | ICD-10-CM | POA: Diagnosis not present

## 2016-12-27 DIAGNOSIS — Z87891 Personal history of nicotine dependence: Secondary | ICD-10-CM | POA: Diagnosis not present

## 2016-12-27 DIAGNOSIS — Z7951 Long term (current) use of inhaled steroids: Secondary | ICD-10-CM | POA: Diagnosis not present

## 2016-12-27 DIAGNOSIS — E785 Hyperlipidemia, unspecified: Secondary | ICD-10-CM | POA: Diagnosis not present

## 2016-12-27 DIAGNOSIS — Z7952 Long term (current) use of systemic steroids: Secondary | ICD-10-CM | POA: Diagnosis not present

## 2016-12-27 DIAGNOSIS — Z9181 History of falling: Secondary | ICD-10-CM | POA: Diagnosis not present

## 2016-12-27 DIAGNOSIS — M949 Disorder of cartilage, unspecified: Secondary | ICD-10-CM | POA: Diagnosis not present

## 2016-12-27 DIAGNOSIS — J441 Chronic obstructive pulmonary disease with (acute) exacerbation: Secondary | ICD-10-CM | POA: Diagnosis not present

## 2016-12-27 DIAGNOSIS — M899 Disorder of bone, unspecified: Secondary | ICD-10-CM | POA: Diagnosis not present

## 2016-12-27 DIAGNOSIS — E559 Vitamin D deficiency, unspecified: Secondary | ICD-10-CM | POA: Diagnosis not present

## 2016-12-27 DIAGNOSIS — Z9981 Dependence on supplemental oxygen: Secondary | ICD-10-CM | POA: Diagnosis not present

## 2016-12-27 DIAGNOSIS — Z7982 Long term (current) use of aspirin: Secondary | ICD-10-CM | POA: Diagnosis not present

## 2016-12-27 DIAGNOSIS — E1165 Type 2 diabetes mellitus with hyperglycemia: Secondary | ICD-10-CM | POA: Diagnosis not present

## 2016-12-27 DIAGNOSIS — I2510 Atherosclerotic heart disease of native coronary artery without angina pectoris: Secondary | ICD-10-CM | POA: Diagnosis not present

## 2016-12-30 DIAGNOSIS — E1165 Type 2 diabetes mellitus with hyperglycemia: Secondary | ICD-10-CM | POA: Diagnosis not present

## 2016-12-30 DIAGNOSIS — M899 Disorder of bone, unspecified: Secondary | ICD-10-CM | POA: Diagnosis not present

## 2016-12-30 DIAGNOSIS — Z794 Long term (current) use of insulin: Secondary | ICD-10-CM | POA: Diagnosis not present

## 2016-12-30 DIAGNOSIS — Z7951 Long term (current) use of inhaled steroids: Secondary | ICD-10-CM | POA: Diagnosis not present

## 2016-12-30 DIAGNOSIS — Z9181 History of falling: Secondary | ICD-10-CM | POA: Diagnosis not present

## 2016-12-30 DIAGNOSIS — Z7982 Long term (current) use of aspirin: Secondary | ICD-10-CM | POA: Diagnosis not present

## 2016-12-30 DIAGNOSIS — Z87891 Personal history of nicotine dependence: Secondary | ICD-10-CM | POA: Diagnosis not present

## 2016-12-30 DIAGNOSIS — Z7952 Long term (current) use of systemic steroids: Secondary | ICD-10-CM | POA: Diagnosis not present

## 2016-12-30 DIAGNOSIS — E559 Vitamin D deficiency, unspecified: Secondary | ICD-10-CM | POA: Diagnosis not present

## 2016-12-30 DIAGNOSIS — I2510 Atherosclerotic heart disease of native coronary artery without angina pectoris: Secondary | ICD-10-CM | POA: Diagnosis not present

## 2016-12-30 DIAGNOSIS — I110 Hypertensive heart disease with heart failure: Secondary | ICD-10-CM | POA: Diagnosis not present

## 2016-12-30 DIAGNOSIS — J441 Chronic obstructive pulmonary disease with (acute) exacerbation: Secondary | ICD-10-CM | POA: Diagnosis not present

## 2016-12-30 DIAGNOSIS — M949 Disorder of cartilage, unspecified: Secondary | ICD-10-CM | POA: Diagnosis not present

## 2016-12-30 DIAGNOSIS — I509 Heart failure, unspecified: Secondary | ICD-10-CM | POA: Diagnosis not present

## 2016-12-30 DIAGNOSIS — E785 Hyperlipidemia, unspecified: Secondary | ICD-10-CM | POA: Diagnosis not present

## 2016-12-30 DIAGNOSIS — Z9981 Dependence on supplemental oxygen: Secondary | ICD-10-CM | POA: Diagnosis not present

## 2017-01-03 DIAGNOSIS — E559 Vitamin D deficiency, unspecified: Secondary | ICD-10-CM | POA: Diagnosis not present

## 2017-01-03 DIAGNOSIS — M899 Disorder of bone, unspecified: Secondary | ICD-10-CM | POA: Diagnosis not present

## 2017-01-03 DIAGNOSIS — I2510 Atherosclerotic heart disease of native coronary artery without angina pectoris: Secondary | ICD-10-CM | POA: Diagnosis not present

## 2017-01-03 DIAGNOSIS — Z87891 Personal history of nicotine dependence: Secondary | ICD-10-CM | POA: Diagnosis not present

## 2017-01-03 DIAGNOSIS — Z9981 Dependence on supplemental oxygen: Secondary | ICD-10-CM | POA: Diagnosis not present

## 2017-01-03 DIAGNOSIS — I509 Heart failure, unspecified: Secondary | ICD-10-CM | POA: Diagnosis not present

## 2017-01-03 DIAGNOSIS — E1165 Type 2 diabetes mellitus with hyperglycemia: Secondary | ICD-10-CM | POA: Diagnosis not present

## 2017-01-03 DIAGNOSIS — Z7982 Long term (current) use of aspirin: Secondary | ICD-10-CM | POA: Diagnosis not present

## 2017-01-03 DIAGNOSIS — M949 Disorder of cartilage, unspecified: Secondary | ICD-10-CM | POA: Diagnosis not present

## 2017-01-03 DIAGNOSIS — I110 Hypertensive heart disease with heart failure: Secondary | ICD-10-CM | POA: Diagnosis not present

## 2017-01-03 DIAGNOSIS — Z7952 Long term (current) use of systemic steroids: Secondary | ICD-10-CM | POA: Diagnosis not present

## 2017-01-03 DIAGNOSIS — Z794 Long term (current) use of insulin: Secondary | ICD-10-CM | POA: Diagnosis not present

## 2017-01-03 DIAGNOSIS — Z7951 Long term (current) use of inhaled steroids: Secondary | ICD-10-CM | POA: Diagnosis not present

## 2017-01-03 DIAGNOSIS — E785 Hyperlipidemia, unspecified: Secondary | ICD-10-CM | POA: Diagnosis not present

## 2017-01-03 DIAGNOSIS — J441 Chronic obstructive pulmonary disease with (acute) exacerbation: Secondary | ICD-10-CM | POA: Diagnosis not present

## 2017-01-03 DIAGNOSIS — Z9181 History of falling: Secondary | ICD-10-CM | POA: Diagnosis not present

## 2017-01-05 DIAGNOSIS — M899 Disorder of bone, unspecified: Secondary | ICD-10-CM | POA: Diagnosis not present

## 2017-01-05 DIAGNOSIS — Z7952 Long term (current) use of systemic steroids: Secondary | ICD-10-CM | POA: Diagnosis not present

## 2017-01-05 DIAGNOSIS — Z9981 Dependence on supplemental oxygen: Secondary | ICD-10-CM | POA: Diagnosis not present

## 2017-01-05 DIAGNOSIS — Z9181 History of falling: Secondary | ICD-10-CM | POA: Diagnosis not present

## 2017-01-05 DIAGNOSIS — E1165 Type 2 diabetes mellitus with hyperglycemia: Secondary | ICD-10-CM | POA: Diagnosis not present

## 2017-01-05 DIAGNOSIS — I2510 Atherosclerotic heart disease of native coronary artery without angina pectoris: Secondary | ICD-10-CM | POA: Diagnosis not present

## 2017-01-05 DIAGNOSIS — Z7951 Long term (current) use of inhaled steroids: Secondary | ICD-10-CM | POA: Diagnosis not present

## 2017-01-05 DIAGNOSIS — E785 Hyperlipidemia, unspecified: Secondary | ICD-10-CM | POA: Diagnosis not present

## 2017-01-05 DIAGNOSIS — I110 Hypertensive heart disease with heart failure: Secondary | ICD-10-CM | POA: Diagnosis not present

## 2017-01-05 DIAGNOSIS — Z794 Long term (current) use of insulin: Secondary | ICD-10-CM | POA: Diagnosis not present

## 2017-01-05 DIAGNOSIS — J441 Chronic obstructive pulmonary disease with (acute) exacerbation: Secondary | ICD-10-CM | POA: Diagnosis not present

## 2017-01-05 DIAGNOSIS — Z7982 Long term (current) use of aspirin: Secondary | ICD-10-CM | POA: Diagnosis not present

## 2017-01-05 DIAGNOSIS — I509 Heart failure, unspecified: Secondary | ICD-10-CM | POA: Diagnosis not present

## 2017-01-05 DIAGNOSIS — Z87891 Personal history of nicotine dependence: Secondary | ICD-10-CM | POA: Diagnosis not present

## 2017-01-05 DIAGNOSIS — E559 Vitamin D deficiency, unspecified: Secondary | ICD-10-CM | POA: Diagnosis not present

## 2017-01-05 DIAGNOSIS — M949 Disorder of cartilage, unspecified: Secondary | ICD-10-CM | POA: Diagnosis not present

## 2017-01-10 DIAGNOSIS — E1165 Type 2 diabetes mellitus with hyperglycemia: Secondary | ICD-10-CM | POA: Diagnosis not present

## 2017-01-10 DIAGNOSIS — Z9181 History of falling: Secondary | ICD-10-CM | POA: Diagnosis not present

## 2017-01-10 DIAGNOSIS — E559 Vitamin D deficiency, unspecified: Secondary | ICD-10-CM | POA: Diagnosis not present

## 2017-01-10 DIAGNOSIS — Z9981 Dependence on supplemental oxygen: Secondary | ICD-10-CM | POA: Diagnosis not present

## 2017-01-10 DIAGNOSIS — Z7951 Long term (current) use of inhaled steroids: Secondary | ICD-10-CM | POA: Diagnosis not present

## 2017-01-10 DIAGNOSIS — E785 Hyperlipidemia, unspecified: Secondary | ICD-10-CM | POA: Diagnosis not present

## 2017-01-10 DIAGNOSIS — Z7982 Long term (current) use of aspirin: Secondary | ICD-10-CM | POA: Diagnosis not present

## 2017-01-10 DIAGNOSIS — Z87891 Personal history of nicotine dependence: Secondary | ICD-10-CM | POA: Diagnosis not present

## 2017-01-10 DIAGNOSIS — I2510 Atherosclerotic heart disease of native coronary artery without angina pectoris: Secondary | ICD-10-CM | POA: Diagnosis not present

## 2017-01-10 DIAGNOSIS — Z794 Long term (current) use of insulin: Secondary | ICD-10-CM | POA: Diagnosis not present

## 2017-01-10 DIAGNOSIS — Z7952 Long term (current) use of systemic steroids: Secondary | ICD-10-CM | POA: Diagnosis not present

## 2017-01-10 DIAGNOSIS — J441 Chronic obstructive pulmonary disease with (acute) exacerbation: Secondary | ICD-10-CM | POA: Diagnosis not present

## 2017-01-10 DIAGNOSIS — I110 Hypertensive heart disease with heart failure: Secondary | ICD-10-CM | POA: Diagnosis not present

## 2017-01-10 DIAGNOSIS — M949 Disorder of cartilage, unspecified: Secondary | ICD-10-CM | POA: Diagnosis not present

## 2017-01-10 DIAGNOSIS — M899 Disorder of bone, unspecified: Secondary | ICD-10-CM | POA: Diagnosis not present

## 2017-01-10 DIAGNOSIS — I509 Heart failure, unspecified: Secondary | ICD-10-CM | POA: Diagnosis not present

## 2017-01-12 ENCOUNTER — Encounter: Payer: Self-pay | Admitting: Adult Health

## 2017-01-12 ENCOUNTER — Telehealth: Payer: Self-pay | Admitting: Internal Medicine

## 2017-01-12 ENCOUNTER — Ambulatory Visit (INDEPENDENT_AMBULATORY_CARE_PROVIDER_SITE_OTHER): Payer: Medicare Other | Admitting: Adult Health

## 2017-01-12 DIAGNOSIS — J9612 Chronic respiratory failure with hypercapnia: Secondary | ICD-10-CM | POA: Diagnosis not present

## 2017-01-12 DIAGNOSIS — M949 Disorder of cartilage, unspecified: Secondary | ICD-10-CM | POA: Diagnosis not present

## 2017-01-12 DIAGNOSIS — Z7952 Long term (current) use of systemic steroids: Secondary | ICD-10-CM | POA: Diagnosis not present

## 2017-01-12 DIAGNOSIS — I110 Hypertensive heart disease with heart failure: Secondary | ICD-10-CM | POA: Diagnosis not present

## 2017-01-12 DIAGNOSIS — Z9181 History of falling: Secondary | ICD-10-CM | POA: Diagnosis not present

## 2017-01-12 DIAGNOSIS — E785 Hyperlipidemia, unspecified: Secondary | ICD-10-CM | POA: Diagnosis not present

## 2017-01-12 DIAGNOSIS — J441 Chronic obstructive pulmonary disease with (acute) exacerbation: Secondary | ICD-10-CM | POA: Diagnosis not present

## 2017-01-12 DIAGNOSIS — Z794 Long term (current) use of insulin: Secondary | ICD-10-CM | POA: Diagnosis not present

## 2017-01-12 DIAGNOSIS — Z87891 Personal history of nicotine dependence: Secondary | ICD-10-CM | POA: Diagnosis not present

## 2017-01-12 DIAGNOSIS — I2510 Atherosclerotic heart disease of native coronary artery without angina pectoris: Secondary | ICD-10-CM | POA: Diagnosis not present

## 2017-01-12 DIAGNOSIS — M899 Disorder of bone, unspecified: Secondary | ICD-10-CM | POA: Diagnosis not present

## 2017-01-12 DIAGNOSIS — Z7982 Long term (current) use of aspirin: Secondary | ICD-10-CM | POA: Diagnosis not present

## 2017-01-12 DIAGNOSIS — Z7951 Long term (current) use of inhaled steroids: Secondary | ICD-10-CM | POA: Diagnosis not present

## 2017-01-12 DIAGNOSIS — I509 Heart failure, unspecified: Secondary | ICD-10-CM | POA: Diagnosis not present

## 2017-01-12 DIAGNOSIS — E1165 Type 2 diabetes mellitus with hyperglycemia: Secondary | ICD-10-CM | POA: Diagnosis not present

## 2017-01-12 DIAGNOSIS — J9611 Chronic respiratory failure with hypoxia: Secondary | ICD-10-CM

## 2017-01-12 DIAGNOSIS — Z9981 Dependence on supplemental oxygen: Secondary | ICD-10-CM | POA: Diagnosis not present

## 2017-01-12 DIAGNOSIS — E559 Vitamin D deficiency, unspecified: Secondary | ICD-10-CM | POA: Diagnosis not present

## 2017-01-12 MED ORDER — DOXYCYCLINE HYCLATE 100 MG PO TABS: 100.0000 mg | ORAL_TABLET | Freq: Two times a day (BID) | ORAL | 0 refills | Status: DC

## 2017-01-12 NOTE — Telephone Encounter (Signed)
Margie spoke with Colletta [place] from [place] is having low O2 levels and had to increase O2. Also, patient is having productive cough-green in color with wheezing. Pt will see TP this afternoon at 3:15 pm. Thanks.

## 2017-01-12 NOTE — Patient Instructions (Addendum)
Finish Brovana/Budesonide neb Twice daily  - finish what you have  Finish Ipratropium Neb Four times a day  -finish what you have.  When you are done with these nebs : Begin Advair 1 puff Twice daily   Begin Spiriva daily  May use Albuterol Neb every 4hrs as needed.  Finish Prednisone taper as directed.  Doxycycline 100mg  Twice daily  -take w/ food.  follow up Dr. Annamaria Boots  In 6 weeks and As needed   Please contact office for sooner follow up if symptoms do not improve or worsen or seek emergency care

## 2017-01-12 NOTE — Progress Notes (Addendum)
@Patient  ID: [name], female    DOB: 15-May-1941, 76 y.o.   MRN: [id number]  Chief Complaint  Patient presents with  . Acute Visit    COPD exacerbation    Referring provider: Glendale Chard, MD  HPI: 76 year-old female former smoker followed for COPD, chronic hypoxic respiratory failure on oxygen 2l/m   01/12/2017 Acute OV : COPD  Patient presents for an acute office visit. Patient complains of 3-4 days of cough, congestion with thick green mucus. She complains of increased shortness of breath.  She remains on oxygen at 2 L..she was called in some prednisone . She has few days left.  Wheezing is better but still coughing up thick green mucus .   She remains on Advair and Spiriva.. She is also taking Brovan/Pulmicort  Twice daily  , Ipratropium neb Four times a day  . We reviewed all her meds and realized these were continued from hospital stay in Feb 2018 . Explained that she does not need to take duplicate meds.  She was on Advair and Spiriva prior to admission .     Allergies  Allergen Reactions  . Bactrim [Sulfamethoxazole-Trimethoprim] Nausea And Vomiting  . Codeine Other (See Comments)    Unknown reaction but cannot take per husband  . Vytorin [Ezetimibe-Simvastatin] Other (See Comments)    Reaction: possibly malaise per husband    Immunization History  Administered Date(s) Administered  . Influenza Split 07/15/2011, 06/03/2012, 07/24/2013, 07/25/2014  . Influenza Whole 07/19/2010  . Influenza, High Dose Seasonal PF 06/30/2016  . Influenza,inj,Quad PF,36+ Mos 06/05/2015  . Pneumococcal Conjugate-13 08/20/2013  . Pneumococcal Polysaccharide-23 10/04/2007  . Tdap 08/20/2013    Past Medical History:  Diagnosis Date  . CAD (coronary artery disease)    Stent RCA 2005, stent LAD 2006  . Carotid artery occlusion   . COPD (chronic obstructive pulmonary disease) ([place])   . Depression   . Diabetes mellitus without complication ([place])   . Fatigue   . Heart murmur    . Hyperlipemia   . Hypertension   . Myocardial infarction   . Recurrent upper respiratory infection (URI)   . Shortness of breath   . Tobacco abuse     Tobacco History: History  Smoking Status  . Former Smoker  . Packs/day: 0.25  . Years: 50.00  . Types: Cigarettes  . Quit date: 02/15/2012  Smokeless Tobacco  . Never Used    Comment: started smoking at age 89/// Pt is currently trying to quit smoking, using the electronic cigs, not longer smoking tobacco   Counseling given: Not Answered   Outpatient Encounter Prescriptions as of 01/12/2017  Medication Sig  . Ascorbic Acid (VITAMIN C) 1000 MG tablet Take 1,000 mg by mouth daily.  Marland Kitchen aspirin EC 81 MG tablet Take 81 mg by mouth at bedtime.   . Biotin (BIOTIN MAXIMUM STRENGTH) 10 MG TABS Take 10 mg by mouth daily. 10,000 mcg  . Cholecalciferol (VITAMIN D) 2000 units tablet Take 2,000 Units by mouth daily.  . clonazePAM (KLONOPIN) 1 MG tablet Take 1 tablet (1 mg total) by mouth at bedtime.  . famotidine (PEPCID) 20 MG tablet Take 1 tablet (20 mg total) by mouth daily.  . fluticasone (FLONASE) 50 MCG/ACT nasal spray Place 2 sprays into both nostrils daily.  . Fluticasone-Salmeterol (ADVAIR DISKUS) 250-50 MCG/DOSE AEPB Inhale 1 puff into the lungs 2 (two) times daily.  . furosemide (LASIX) 40 MG tablet Take 1 tablet (40 mg total) by mouth daily as needed.  Marland Kitchen  gabapentin (NEURONTIN) 100 MG capsule Take 1 capsule (100 mg total) by mouth 3 (three) times daily as needed (pain).  Marland Kitchen guaiFENesin (MUCINEX) 600 MG 12 hr tablet Take 1 tablet (600 mg total) by mouth 2 (two) times daily.  . insulin aspart (NOVOLOG FLEXPEN) 100 UNIT/ML FlexPen Sliding scale insulin  CBG 121 - 150: 3 units  CBG 151 - 200: 4 units  CBG 201 - 250: 7 units  CBG 251 - 300: 11 units  CBG 301 - 350: 15 units  CBG 351 - 400: 20 units  . levalbuterol (XOPENEX) 0.63 MG/3ML nebulizer solution Take 3 mLs (0.63 mg total) by nebulization every 2 (two) hours as needed for  wheezing or shortness of breath.  . loratadine (CLARITIN) 10 MG tablet Take 1 tablet (10 mg total) by mouth daily.  . Melatonin 5 MG TABS Take 5 mg by mouth at bedtime.  . Multiple Vitamin (MULTIVITAMIN WITH MINERALS) TABS tablet Take 1 tablet by mouth daily.  . nitroGLYCERIN (NITROSTAT) 0.4 MG SL tablet Place 1 tablet (0.4 mg total) under the tongue every 5 (five) minutes as needed for chest pain.  . OXYGEN Inhale into the lungs continuous. 2 1/2 - 3 L  . potassium chloride (K-DUR) 10 MEQ tablet Take 1 tablet (10 mEq total) by mouth daily as needed.  . predniSONE (DELTASONE) 5 MG tablet 2 tabs daily X3 days, 1 tab daily X3 days, then 1 tab every other day until gone.  . senna-docusate (SENOKOT-S) 8.6-50 MG tablet Take 1 tablet by mouth at bedtime as needed for mild constipation.  Marland Kitchen SPIRIVA HANDIHALER 18 MCG inhalation capsule INHALE 1 CAPSULE DAILY  . amitriptyline (ELAVIL) 10 MG tablet 1 daily at bedtime (Patient not taking: Reported on 01/12/2017)  . doxycycline (VIBRA-TABS) 100 MG tablet Take 1 tablet (100 mg total) by mouth 2 (two) times daily.  . Insulin Glargine (LANTUS) 100 UNIT/ML Solostar Pen Inject 10 Units into the skin daily. (Patient not taking: Reported on 01/12/2017)  . pantoprazole (PROTONIX) 40 MG tablet Take 1 tablet (40 mg total) by mouth 2 (two) times daily before a meal. (Patient not taking: Reported on 01/12/2017)  . [DISCONTINUED] arformoterol (BROVANA) 15 MCG/2ML NEBU Take 2 mLs (15 mcg total) by nebulization 2 (two) times daily. (Patient not taking: Reported on 01/12/2017)  . [DISCONTINUED] budesonide (PULMICORT) 0.5 MG/2ML nebulizer solution Take 2 mLs (0.5 mg total) by nebulization 2 (two) times daily. (Patient not taking: Reported on 01/12/2017)  . [DISCONTINUED] ipratropium (ATROVENT) 0.02 % nebulizer solution Take 2.5 mLs (0.5 mg total) by nebulization 2 (two) times daily. (Patient not taking: Reported on 01/12/2017)   No facility-administered encounter medications on file  as of 01/12/2017.      Review of Systems  Constitutional:   No  weight loss, night sweats,  Fevers, chills, fatigue, or  lassitude.  HEENT:   No headaches,  Difficulty swallowing,  Tooth/dental problems, or  Sore throat,                No sneezing, itching, ear ache, nasal congestion, post nasal drip,   CV:  No chest pain,  Orthopnea, PND, swelling in lower extremities, anasarca, dizziness, palpitations, syncope.   GI  No heartburn, indigestion, abdominal pain, nausea, vomiting, diarrhea, change in bowel habits, loss of appetite, bloody stools.   Resp:    No chest wall deformity  Skin: no rash or lesions.  GU: no dysuria, change in color of urine, no urgency or frequency.  No flank pain, no hematuria  MS:  No joint pain or swelling.  No decreased range of motion.  No back pain.    Physical Exam  BP 130/64 (BP Location: Left Arm, Cuff Size: Normal)   Pulse 99   Ht 5\' 1"  (1.549 m)   Wt 120 lb 12.8 oz (54.8 kg)   SpO2 94%   BMI 22.82 kg/m   GEN: A/Ox3; pleasant , NAD, elderly on O2    HEENT:  [place]/AT,  EACs-clear, TMs-wnl, NOSE-clear, THROAT-clear, no lesions, no postnasal drip or exudate noted.   NECK:  Supple w/ fair ROM; no JVD; normal carotid impulses w/o bruits; no thyromegaly or nodules palpated; no lymphadenopathy.    RESP  Decreased BS in bases ,  no accessory muscle use, no dullness to percussion  CARD:  RRR, no m/r/g, no peripheral edema, pulses intact, no cyanosis or clubbing.  GI:   Soft & nt; nml bowel sounds; no organomegaly or masses detected.   Musco: Warm bil, no deformities or joint swelling noted.   Neuro: alert, no focal deficits noted.    Skin: Warm, no lesions or rashes    Lab Results:   Imaging: No results found.   Assessment & Plan:   COPD with acute exacerbation ([place]) Flare  meds adjusted to avoid duplicates   Plan  Patient Instructions  Finish Brovana/Budesonide neb Twice daily  - finish what you have  Finish Ipratropium Neb  Four times a day  -finish what you have.  When you are done with these nebs : Begin Advair 1 puff Twice daily   Begin Spiriva daily  May use Albuterol Neb every 4hrs as needed.  Finish Prednisone taper as directed.  Doxycycline 100mg  Twice daily  -take w/ food.  follow up Dr. Annamaria Boots  In 6 weeks and As needed   Please contact office for sooner follow up if symptoms do not improve or worsen or seek emergency care       Respiratory failure with hypoxia and hypercapnia ([place]) Cont on o2      [name] Clingenpeel, NP 01/12/2017

## 2017-01-12 NOTE — Assessment & Plan Note (Addendum)
Flare  meds adjusted to avoid duplicates   Plan  Patient Instructions  Finish Brovana/Budesonide neb Twice daily  - finish what you have  Finish Ipratropium Neb Four times a day  -finish what you have.  When you are done with these nebs : Begin Advair 1 puff Twice daily   Begin Spiriva daily  May use Albuterol Neb every 4hrs as needed.  Finish Prednisone taper as directed.  Doxycycline 100mg  Twice daily  -take w/ food.  follow up Dr. Annamaria Boots  In 6 weeks and As needed   Please contact office for sooner follow up if symptoms do not improve or worsen or seek emergency care

## 2017-01-12 NOTE — Telephone Encounter (Signed)
Made in error.[name]

## 2017-01-12 NOTE — Assessment & Plan Note (Signed)
Cont on o2 .  

## 2017-01-16 ENCOUNTER — Ambulatory Visit: Payer: Medicare Other | Admitting: Cardiovascular Disease

## 2017-01-17 DIAGNOSIS — Z9981 Dependence on supplemental oxygen: Secondary | ICD-10-CM | POA: Diagnosis not present

## 2017-01-17 DIAGNOSIS — M949 Disorder of cartilage, unspecified: Secondary | ICD-10-CM | POA: Diagnosis not present

## 2017-01-17 DIAGNOSIS — I110 Hypertensive heart disease with heart failure: Secondary | ICD-10-CM | POA: Diagnosis not present

## 2017-01-17 DIAGNOSIS — Z7951 Long term (current) use of inhaled steroids: Secondary | ICD-10-CM | POA: Diagnosis not present

## 2017-01-17 DIAGNOSIS — Z7982 Long term (current) use of aspirin: Secondary | ICD-10-CM | POA: Diagnosis not present

## 2017-01-17 DIAGNOSIS — E785 Hyperlipidemia, unspecified: Secondary | ICD-10-CM | POA: Diagnosis not present

## 2017-01-17 DIAGNOSIS — J441 Chronic obstructive pulmonary disease with (acute) exacerbation: Secondary | ICD-10-CM | POA: Diagnosis not present

## 2017-01-17 DIAGNOSIS — E559 Vitamin D deficiency, unspecified: Secondary | ICD-10-CM | POA: Diagnosis not present

## 2017-01-17 DIAGNOSIS — I2510 Atherosclerotic heart disease of native coronary artery without angina pectoris: Secondary | ICD-10-CM | POA: Diagnosis not present

## 2017-01-17 DIAGNOSIS — I509 Heart failure, unspecified: Secondary | ICD-10-CM | POA: Diagnosis not present

## 2017-01-17 DIAGNOSIS — E1165 Type 2 diabetes mellitus with hyperglycemia: Secondary | ICD-10-CM | POA: Diagnosis not present

## 2017-01-17 DIAGNOSIS — Z9181 History of falling: Secondary | ICD-10-CM | POA: Diagnosis not present

## 2017-01-17 DIAGNOSIS — Z794 Long term (current) use of insulin: Secondary | ICD-10-CM | POA: Diagnosis not present

## 2017-01-17 DIAGNOSIS — M899 Disorder of bone, unspecified: Secondary | ICD-10-CM | POA: Diagnosis not present

## 2017-01-17 DIAGNOSIS — Z7952 Long term (current) use of systemic steroids: Secondary | ICD-10-CM | POA: Diagnosis not present

## 2017-01-17 DIAGNOSIS — Z87891 Personal history of nicotine dependence: Secondary | ICD-10-CM | POA: Diagnosis not present

## 2017-01-18 DIAGNOSIS — Z9981 Dependence on supplemental oxygen: Secondary | ICD-10-CM | POA: Diagnosis not present

## 2017-01-18 DIAGNOSIS — I2510 Atherosclerotic heart disease of native coronary artery without angina pectoris: Secondary | ICD-10-CM | POA: Diagnosis not present

## 2017-01-18 DIAGNOSIS — M899 Disorder of bone, unspecified: Secondary | ICD-10-CM | POA: Diagnosis not present

## 2017-01-18 DIAGNOSIS — Z794 Long term (current) use of insulin: Secondary | ICD-10-CM | POA: Diagnosis not present

## 2017-01-18 DIAGNOSIS — E1165 Type 2 diabetes mellitus with hyperglycemia: Secondary | ICD-10-CM | POA: Diagnosis not present

## 2017-01-18 DIAGNOSIS — E785 Hyperlipidemia, unspecified: Secondary | ICD-10-CM | POA: Diagnosis not present

## 2017-01-18 DIAGNOSIS — Z7951 Long term (current) use of inhaled steroids: Secondary | ICD-10-CM | POA: Diagnosis not present

## 2017-01-18 DIAGNOSIS — Z7952 Long term (current) use of systemic steroids: Secondary | ICD-10-CM | POA: Diagnosis not present

## 2017-01-18 DIAGNOSIS — M949 Disorder of cartilage, unspecified: Secondary | ICD-10-CM | POA: Diagnosis not present

## 2017-01-18 DIAGNOSIS — Z7982 Long term (current) use of aspirin: Secondary | ICD-10-CM | POA: Diagnosis not present

## 2017-01-18 DIAGNOSIS — I110 Hypertensive heart disease with heart failure: Secondary | ICD-10-CM | POA: Diagnosis not present

## 2017-01-18 DIAGNOSIS — E559 Vitamin D deficiency, unspecified: Secondary | ICD-10-CM | POA: Diagnosis not present

## 2017-01-18 DIAGNOSIS — J441 Chronic obstructive pulmonary disease with (acute) exacerbation: Secondary | ICD-10-CM | POA: Diagnosis not present

## 2017-01-18 DIAGNOSIS — I509 Heart failure, unspecified: Secondary | ICD-10-CM | POA: Diagnosis not present

## 2017-01-18 DIAGNOSIS — Z87891 Personal history of nicotine dependence: Secondary | ICD-10-CM | POA: Diagnosis not present

## 2017-01-18 DIAGNOSIS — Z9181 History of falling: Secondary | ICD-10-CM | POA: Diagnosis not present

## 2017-01-24 DIAGNOSIS — I2510 Atherosclerotic heart disease of native coronary artery without angina pectoris: Secondary | ICD-10-CM | POA: Diagnosis not present

## 2017-01-24 DIAGNOSIS — Z7952 Long term (current) use of systemic steroids: Secondary | ICD-10-CM | POA: Diagnosis not present

## 2017-01-24 DIAGNOSIS — Z794 Long term (current) use of insulin: Secondary | ICD-10-CM | POA: Diagnosis not present

## 2017-01-24 DIAGNOSIS — Z87891 Personal history of nicotine dependence: Secondary | ICD-10-CM | POA: Diagnosis not present

## 2017-01-24 DIAGNOSIS — Z7951 Long term (current) use of inhaled steroids: Secondary | ICD-10-CM | POA: Diagnosis not present

## 2017-01-24 DIAGNOSIS — M899 Disorder of bone, unspecified: Secondary | ICD-10-CM | POA: Diagnosis not present

## 2017-01-24 DIAGNOSIS — Z7982 Long term (current) use of aspirin: Secondary | ICD-10-CM | POA: Diagnosis not present

## 2017-01-24 DIAGNOSIS — Z9181 History of falling: Secondary | ICD-10-CM | POA: Diagnosis not present

## 2017-01-24 DIAGNOSIS — E559 Vitamin D deficiency, unspecified: Secondary | ICD-10-CM | POA: Diagnosis not present

## 2017-01-24 DIAGNOSIS — E1165 Type 2 diabetes mellitus with hyperglycemia: Secondary | ICD-10-CM | POA: Diagnosis not present

## 2017-01-24 DIAGNOSIS — Z9981 Dependence on supplemental oxygen: Secondary | ICD-10-CM | POA: Diagnosis not present

## 2017-01-24 DIAGNOSIS — J441 Chronic obstructive pulmonary disease with (acute) exacerbation: Secondary | ICD-10-CM | POA: Diagnosis not present

## 2017-01-24 DIAGNOSIS — E785 Hyperlipidemia, unspecified: Secondary | ICD-10-CM | POA: Diagnosis not present

## 2017-01-24 DIAGNOSIS — M949 Disorder of cartilage, unspecified: Secondary | ICD-10-CM | POA: Diagnosis not present

## 2017-01-24 DIAGNOSIS — I509 Heart failure, unspecified: Secondary | ICD-10-CM | POA: Diagnosis not present

## 2017-01-24 DIAGNOSIS — I110 Hypertensive heart disease with heart failure: Secondary | ICD-10-CM | POA: Diagnosis not present

## 2017-01-26 DIAGNOSIS — I2510 Atherosclerotic heart disease of native coronary artery without angina pectoris: Secondary | ICD-10-CM | POA: Diagnosis not present

## 2017-01-26 DIAGNOSIS — E1165 Type 2 diabetes mellitus with hyperglycemia: Secondary | ICD-10-CM | POA: Diagnosis not present

## 2017-01-26 DIAGNOSIS — I509 Heart failure, unspecified: Secondary | ICD-10-CM | POA: Diagnosis not present

## 2017-01-26 DIAGNOSIS — E785 Hyperlipidemia, unspecified: Secondary | ICD-10-CM | POA: Diagnosis not present

## 2017-01-26 DIAGNOSIS — Z794 Long term (current) use of insulin: Secondary | ICD-10-CM | POA: Diagnosis not present

## 2017-01-26 DIAGNOSIS — Z9181 History of falling: Secondary | ICD-10-CM | POA: Diagnosis not present

## 2017-01-26 DIAGNOSIS — J441 Chronic obstructive pulmonary disease with (acute) exacerbation: Secondary | ICD-10-CM | POA: Diagnosis not present

## 2017-01-26 DIAGNOSIS — Z7951 Long term (current) use of inhaled steroids: Secondary | ICD-10-CM | POA: Diagnosis not present

## 2017-01-26 DIAGNOSIS — Z7952 Long term (current) use of systemic steroids: Secondary | ICD-10-CM | POA: Diagnosis not present

## 2017-01-26 DIAGNOSIS — Z7982 Long term (current) use of aspirin: Secondary | ICD-10-CM | POA: Diagnosis not present

## 2017-01-26 DIAGNOSIS — M949 Disorder of cartilage, unspecified: Secondary | ICD-10-CM | POA: Diagnosis not present

## 2017-01-26 DIAGNOSIS — I110 Hypertensive heart disease with heart failure: Secondary | ICD-10-CM | POA: Diagnosis not present

## 2017-01-26 DIAGNOSIS — M899 Disorder of bone, unspecified: Secondary | ICD-10-CM | POA: Diagnosis not present

## 2017-01-26 DIAGNOSIS — Z87891 Personal history of nicotine dependence: Secondary | ICD-10-CM | POA: Diagnosis not present

## 2017-01-26 DIAGNOSIS — E559 Vitamin D deficiency, unspecified: Secondary | ICD-10-CM | POA: Diagnosis not present

## 2017-01-26 DIAGNOSIS — Z9981 Dependence on supplemental oxygen: Secondary | ICD-10-CM | POA: Diagnosis not present

## 2017-01-30 DIAGNOSIS — M949 Disorder of cartilage, unspecified: Secondary | ICD-10-CM | POA: Diagnosis not present

## 2017-01-30 DIAGNOSIS — J441 Chronic obstructive pulmonary disease with (acute) exacerbation: Secondary | ICD-10-CM | POA: Diagnosis not present

## 2017-01-30 DIAGNOSIS — Z9981 Dependence on supplemental oxygen: Secondary | ICD-10-CM | POA: Diagnosis not present

## 2017-01-30 DIAGNOSIS — Z7951 Long term (current) use of inhaled steroids: Secondary | ICD-10-CM | POA: Diagnosis not present

## 2017-01-30 DIAGNOSIS — E785 Hyperlipidemia, unspecified: Secondary | ICD-10-CM | POA: Diagnosis not present

## 2017-01-30 DIAGNOSIS — E1165 Type 2 diabetes mellitus with hyperglycemia: Secondary | ICD-10-CM | POA: Diagnosis not present

## 2017-01-30 DIAGNOSIS — Z9181 History of falling: Secondary | ICD-10-CM | POA: Diagnosis not present

## 2017-01-30 DIAGNOSIS — Z7982 Long term (current) use of aspirin: Secondary | ICD-10-CM | POA: Diagnosis not present

## 2017-01-30 DIAGNOSIS — Z87891 Personal history of nicotine dependence: Secondary | ICD-10-CM | POA: Diagnosis not present

## 2017-01-30 DIAGNOSIS — M899 Disorder of bone, unspecified: Secondary | ICD-10-CM | POA: Diagnosis not present

## 2017-01-30 DIAGNOSIS — Z7952 Long term (current) use of systemic steroids: Secondary | ICD-10-CM | POA: Diagnosis not present

## 2017-01-30 DIAGNOSIS — Z794 Long term (current) use of insulin: Secondary | ICD-10-CM | POA: Diagnosis not present

## 2017-01-30 DIAGNOSIS — E559 Vitamin D deficiency, unspecified: Secondary | ICD-10-CM | POA: Diagnosis not present

## 2017-01-30 DIAGNOSIS — I509 Heart failure, unspecified: Secondary | ICD-10-CM | POA: Diagnosis not present

## 2017-01-30 DIAGNOSIS — I2510 Atherosclerotic heart disease of native coronary artery without angina pectoris: Secondary | ICD-10-CM | POA: Diagnosis not present

## 2017-01-30 DIAGNOSIS — I110 Hypertensive heart disease with heart failure: Secondary | ICD-10-CM | POA: Diagnosis not present

## 2017-02-01 DIAGNOSIS — M899 Disorder of bone, unspecified: Secondary | ICD-10-CM | POA: Diagnosis not present

## 2017-02-01 DIAGNOSIS — E1165 Type 2 diabetes mellitus with hyperglycemia: Secondary | ICD-10-CM | POA: Diagnosis not present

## 2017-02-01 DIAGNOSIS — Z7982 Long term (current) use of aspirin: Secondary | ICD-10-CM | POA: Diagnosis not present

## 2017-02-01 DIAGNOSIS — E559 Vitamin D deficiency, unspecified: Secondary | ICD-10-CM | POA: Diagnosis not present

## 2017-02-01 DIAGNOSIS — Z7952 Long term (current) use of systemic steroids: Secondary | ICD-10-CM | POA: Diagnosis not present

## 2017-02-01 DIAGNOSIS — Z7951 Long term (current) use of inhaled steroids: Secondary | ICD-10-CM | POA: Diagnosis not present

## 2017-02-01 DIAGNOSIS — M949 Disorder of cartilage, unspecified: Secondary | ICD-10-CM | POA: Diagnosis not present

## 2017-02-01 DIAGNOSIS — Z87891 Personal history of nicotine dependence: Secondary | ICD-10-CM | POA: Diagnosis not present

## 2017-02-01 DIAGNOSIS — I509 Heart failure, unspecified: Secondary | ICD-10-CM | POA: Diagnosis not present

## 2017-02-01 DIAGNOSIS — Z794 Long term (current) use of insulin: Secondary | ICD-10-CM | POA: Diagnosis not present

## 2017-02-01 DIAGNOSIS — Z9981 Dependence on supplemental oxygen: Secondary | ICD-10-CM | POA: Diagnosis not present

## 2017-02-01 DIAGNOSIS — I2510 Atherosclerotic heart disease of native coronary artery without angina pectoris: Secondary | ICD-10-CM | POA: Diagnosis not present

## 2017-02-01 DIAGNOSIS — E785 Hyperlipidemia, unspecified: Secondary | ICD-10-CM | POA: Diagnosis not present

## 2017-02-01 DIAGNOSIS — Z9181 History of falling: Secondary | ICD-10-CM | POA: Diagnosis not present

## 2017-02-01 DIAGNOSIS — I110 Hypertensive heart disease with heart failure: Secondary | ICD-10-CM | POA: Diagnosis not present

## 2017-02-01 DIAGNOSIS — J441 Chronic obstructive pulmonary disease with (acute) exacerbation: Secondary | ICD-10-CM | POA: Diagnosis not present

## 2017-02-03 DIAGNOSIS — J449 Chronic obstructive pulmonary disease, unspecified: Secondary | ICD-10-CM | POA: Diagnosis not present

## 2017-02-20 ENCOUNTER — Encounter: Payer: Self-pay | Admitting: Internal Medicine

## 2017-02-20 ENCOUNTER — Ambulatory Visit (INDEPENDENT_AMBULATORY_CARE_PROVIDER_SITE_OTHER): Payer: Medicare Other | Admitting: Internal Medicine

## 2017-02-20 DIAGNOSIS — Z7189 Other specified counseling: Secondary | ICD-10-CM

## 2017-02-20 DIAGNOSIS — J449 Chronic obstructive pulmonary disease, unspecified: Secondary | ICD-10-CM

## 2017-02-20 NOTE — Patient Instructions (Addendum)
Nebulizer tubing and hand-piece if available  Sample Trelegy x 2-    Inhale 1 puff, then rinse mouth, once daily     Try this instead of Advair plus Spiriva.   Ask Dr Baird Cancer if she can give you some thing to help with your depression

## 2017-02-20 NOTE — Progress Notes (Signed)
Patient ID: [name], female    DOB: 1941-01-05, 76 y.o.   MRN: [id number]  HPI Female former smoker followed for COPD, chronic hypoxic respiratory failure, complicated by CAD/stents/MI, DM End of Life Discussion-reviewed options briefly. She would accept short-term intubation if we thought a problem was reversible, such as an acute pneumonia. I suggested she share her thoughts with her family so they would understand her wishes. I think she is pretty realistic and understands she has end-stage lung disease --------------------------------------------------------------------------------------------------------------------  09/06/2016-76 year old female former smoker followed for COPD, chronic hypoxic respiratory failure, complicated by CAD/stents/MI, DM 2       Husband here Acute Visit-ACUTE VISIT: Shingles 3wk.,up and down hurting in lung area.SOB.  On Valtrex/ Gabapentin Shingles right scapula. Treated by PCP. Rash fading but still significant neuralgic pain. Not pleuritic. Her cough is increased a little and is often productive white to discolored with no blood. Remains on O2 at husband says she can desaturate quickly into the 70% range she tries to walk without it.  02/20/17- 76 year old female former smoker followed for COPD, chronic hypoxic respiratory failure, complicated by CAD/stents/MI, DM 2 O2 1-2 liters/Advanced LOV-NP-12/15/16-    hospital follow-up  resolving acute exacerbation after hospital for COPD exacerbation. Daughter here with her today complains of feeling "weak and drowsy" but admits being depressed. Using nebulizer twice daily. Occasional green plug of mucus.  CXR 11/26/16- 1V- No active disease.  ROS-see HPI   Negative unless "+" Constitutional:    weight loss, night sweats, fevers, chills, fatigue, lassitude. HEENT:    headaches, difficulty swallowing, tooth/dental problems, sore throat,       sneezing, itching, ear ache, nasal congestion, post nasal drip,  snoring CV:    +chest pain, orthopnea, PND, swelling in lower extremities, anasarca,                                                             dizziness, palpitations Resp:   +shortness of breath with exertion or at rest.                 + productive cough,   +non-productive cough, coughing up of blood.              change in color of mucus.  wheezing.   Skin:    rash or lesions. GI:  No-   heartburn, indigestion, abdominal pain, nausea, vomiting,  GU: . MS:   joint pain, stiffness, . Neuro-     nothing unusual Psych:  change in mood or affect.  depression or anxiety.   memory loss.  OBJ- Physical Exam   O2 2-3L General- Alert, Oriented, Affect-appropriate, Distress- none acute+ Portable O2  Skin-- rash-none, lesions- none, excoriation- none Lymphadenopathy- none Head- atraumatic            Eyes- Gross vision intact, PERRLA, conjunctivae and secretions clear            Ears- Hearing, canals-normal            Nose- Clear, no-Septal dev, mucus, polyps, erosion, perforation             Throat- Mallampati II , mucosa clear , drainage- none, tonsils- atrophic Neck- flexible , trachea midline, no stridor , thyroid nl, carotid no bruit Chest - symmetrical excursion ,  unlabored           Heart/CV- RRR , no murmur , no gallop  , no rub, nl s1 s2                           - JVD- none , edema- none, stasis changes- none, varices- none           Lung-  + distant/clear to P&A /very distant, unlabored,  wheeze- none, cough + rattling , dullness-none, rub- none           Chest wall- no rub, no tenderness to light pressure Abd-  Br/ Gen/ Rectal- Not done, not indicated Extrem- cyanosis- none, clubbing, none, atrophy- none, strength- nl Neuro- grossly intact to observation

## 2017-02-28 ENCOUNTER — Telehealth: Payer: Self-pay | Admitting: Internal Medicine

## 2017-02-28 MED ORDER — AZITHROMYCIN 250 MG PO TABS
ORAL_TABLET | ORAL | 0 refills | Status: DC
Start: 2017-02-28 — End: 2017-06-02

## 2017-02-28 MED ORDER — PREDNISONE 10 MG PO TABS: ORAL_TABLET | ORAL | 0 refills | Status: DC

## 2017-02-28 NOTE — Telephone Encounter (Signed)
Per TP: okay for pred 10mg  taper #20 and a zpak.  ER if no better   Called spoke with patient's daughter [name] and advised of TP's recommendations as stated above.  [name] voiced her understanding and denied any questions/concerns.    Rx's sent to verified pharmacy. Nothing further needed; will sign off.

## 2017-02-28 NOTE — Telephone Encounter (Signed)
Spoke with pt's daughter (dpr on file), requesting a pred taper and abx-  Pt c/o prod cough with yellow/green mucus, increased sob, chest tightness X3 days.  Denies fever, sinus congestion, sharp chest pains.    Has been taking mucinex, albuterol neb to help with s/s.  Requesting further recs.    Also needs refill on albuerol inhaler   [facility] [address]    Sending to DOD as CY has left for the day.  TP please advise on recs.  Thanks.

## 2017-03-01 ENCOUNTER — Telehealth: Payer: Self-pay | Admitting: Internal Medicine

## 2017-03-01 NOTE — Telephone Encounter (Signed)
Spoke with the pt  She is asking for refill on atrovent nebs  She was given this by another provider back in Feb 2018 and now she is out  Last AVS from visit with TP in April states:Finish Brovana/Budesonide neb Twice daily  - finish what you have  Finish Ipratropium Neb Four times a day  -finish what you have.  When you are done with these nebs : Begin Advair 1 puff Twice daily   Begin Spiriva daily  May use Albuterol Neb every 4hrs as needed.  Finish Prednisone taper as directed.  Doxycycline 100mg  Twice daily  -take w/ food.  follow up Dr. Annamaria Boots  In 6 weeks and As needed   Please contact office for sooner follow up if symptoms do not improve or worsen or seek emergency care   She is already taking the advair and spiriva  Please advise, thanks

## 2017-03-02 MED ORDER — IPRATROPIUM BROMIDE 0.02 % IN SOLN: 0.5000 mg | Freq: Four times a day (QID) | RESPIRATORY_TRACT | 11 refills | Status: DC

## 2017-03-02 NOTE — Telephone Encounter (Signed)
Called and spoke with pt and she is aware of refill that has been sent to the pharmacy for the ipratropium.  Nothing further is needed.

## 2017-03-02 NOTE — Telephone Encounter (Signed)
Ok to refill her Atrovent  (ipratropium neb solution) for 1 year. Spiriva was prescribed, and is similar. If the Spiriva controls her, then she doesn't need to use the Atrovent- just keep it for extra help if needed.

## 2017-03-03 ENCOUNTER — Other Ambulatory Visit: Payer: Self-pay | Admitting: Internal Medicine

## 2017-03-03 MED ORDER — ALBUTEROL SULFATE HFA 108 (90 BASE) MCG/ACT IN AERS: 2.0000 | INHALATION_SPRAY | RESPIRATORY_TRACT | 11 refills | Status: DC | PRN

## 2017-03-05 NOTE — Assessment & Plan Note (Signed)
We discussed options. Plan-try replacing Spiriva/Advair with sample Trelegy for trial. Given new mouthpiece for her nebulizer machine. Her airflow is so poor that she will probably depend on nebulizer machine more than inhaler.

## 2017-03-05 NOTE — Assessment & Plan Note (Signed)
Situational depression is appropriate for someone dealing with end-stage lung disease. I suggested she ask her primary physician about trying something different as an antidepressant. She has discontinued hospice in the past, but a time will come.

## 2017-03-06 DIAGNOSIS — J449 Chronic obstructive pulmonary disease, unspecified: Secondary | ICD-10-CM | POA: Diagnosis not present

## 2017-03-13 ENCOUNTER — Telehealth: Payer: Self-pay | Admitting: Internal Medicine

## 2017-03-13 MED ORDER — PREDNISONE 10 MG PO TABS: ORAL_TABLET | ORAL | 0 refills | Status: DC

## 2017-03-13 NOTE — Telephone Encounter (Signed)
Suggest prednisone 10 mg, # 20,    1 daily x 10 days, then one every other day  Stay well hydrated

## 2017-03-13 NOTE — Telephone Encounter (Signed)
Spoke with pt. States that she is not feeling. Reports body aches, cough and SOB. Cough is producing green mucus. Denies chest tightness, wheezing or fever. Symptoms started back after she finished prednisone on 03/08/17. Pt would like CY's recommendations.  CY - please advise. Thanks.  Allergies  Allergen Reactions  . Bactrim [Sulfamethoxazole-Trimethoprim] Nausea And Vomiting  . Codeine Other (See Comments)    Unknown reaction but cannot take per husband  . Vytorin [Ezetimibe-Simvastatin] Other (See Comments)    Reaction: possibly malaise per husband   Current Outpatient Prescriptions on File Prior to Visit  Medication Sig Dispense Refill  . albuterol (PROAIR HFA) 108 (90 Base) MCG/ACT inhaler Inhale 2 puffs into the lungs every 4 (four) hours as needed for wheezing or shortness of breath. 1 Inhaler 11  . amitriptyline (ELAVIL) 10 MG tablet 1 daily at bedtime 30 tablet 0  . Ascorbic Acid (VITAMIN C) 1000 MG tablet Take 1,000 mg by mouth daily.    Marland Kitchen aspirin EC 81 MG tablet Take 81 mg by mouth at bedtime.     Marland Kitchen azithromycin (ZITHROMAX) 250 MG tablet Take as directed 6 tablet 0  . Biotin (BIOTIN MAXIMUM STRENGTH) 10 MG TABS Take 10 mg by mouth daily. 10,000 mcg    . Cholecalciferol (VITAMIN D) 2000 units tablet Take 2,000 Units by mouth daily.    . clonazePAM (KLONOPIN) 1 MG tablet Take 1 tablet (1 mg total) by mouth at bedtime. 10 tablet 0  . famotidine (PEPCID) 20 MG tablet Take 1 tablet (20 mg total) by mouth daily. 30 tablet 0  . fluticasone (FLONASE) 50 MCG/ACT nasal spray Place 2 sprays into both nostrils daily. 16 g 2  . Fluticasone-Salmeterol (ADVAIR DISKUS) 250-50 MCG/DOSE AEPB Inhale 1 puff into the lungs 2 (two) times daily. 60 each 5  . furosemide (LASIX) 40 MG tablet Take 1 tablet (40 mg total) by mouth daily as needed. 90 tablet 3  . gabapentin (NEURONTIN) 100 MG capsule Take 1 capsule (100 mg total) by mouth 3 (three) times daily as needed (pain). 90 capsule 0  .  guaiFENesin (MUCINEX) 600 MG 12 hr tablet Take 1 tablet (600 mg total) by mouth 2 (two) times daily.    Marland Kitchen ipratropium (ATROVENT) 0.02 % nebulizer solution Take 2.5 mLs (0.5 mg total) by nebulization 4 (four) times daily. 300 mL 11  . levalbuterol (XOPENEX) 0.63 MG/3ML nebulizer solution Take 3 mLs (0.63 mg total) by nebulization every 2 (two) hours as needed for wheezing or shortness of breath. 3 mL 1  . loratadine (CLARITIN) 10 MG tablet Take 1 tablet (10 mg total) by mouth daily. 30 tablet 0  . Melatonin 5 MG TABS Take 5 mg by mouth at bedtime.    . metFORMIN (GLUCOPHAGE-XR) 500 MG 24 hr tablet Take 1 tablet by mouth every morning then 2 tablets in the evening  0  . Multiple Vitamin (MULTIVITAMIN WITH MINERALS) TABS tablet Take 1 tablet by mouth daily.    . nitroGLYCERIN (NITROSTAT) 0.4 MG SL tablet Place 1 tablet (0.4 mg total) under the tongue every 5 (five) minutes as needed for chest pain. 25 tablet 6  . OXYGEN Inhale into the lungs continuous. 2 1/2 - 3 L    . pantoprazole (PROTONIX) 40 MG tablet Take 1 tablet (40 mg total) by mouth 2 (two) times daily before a meal. 60 tablet 0  . potassium chloride (K-DUR) 10 MEQ tablet Take 1 tablet (10 mEq total) by mouth daily as needed. 90 tablet 3  .  predniSONE (DELTASONE) 10 MG tablet Take 4 tabs for 2 days, then 3 tabs for 2 days, 2 tabs for 2 days, then 1 tab for 2 days, then stop. 20 tablet 0  . senna-docusate (SENOKOT-S) 8.6-50 MG tablet Take 1 tablet by mouth at bedtime as needed for mild constipation. 15 tablet 0  . SPIRIVA HANDIHALER 18 MCG inhalation capsule INHALE 1 CAPSULE DAILY 30 capsule 8   No current facility-administered medications on file prior to visit.

## 2017-03-13 NOTE — Telephone Encounter (Signed)
Spoke with pt. She is aware of CY's recommendation. Rx has been sent in. Nothing further was needed.  

## 2017-03-20 ENCOUNTER — Telehealth: Payer: Self-pay | Admitting: Internal Medicine

## 2017-03-20 DIAGNOSIS — E119 Type 2 diabetes mellitus without complications: Secondary | ICD-10-CM | POA: Diagnosis not present

## 2017-03-20 DIAGNOSIS — Z1389 Encounter for screening for other disorder: Secondary | ICD-10-CM | POA: Diagnosis not present

## 2017-03-20 DIAGNOSIS — I10 Essential (primary) hypertension: Secondary | ICD-10-CM | POA: Diagnosis not present

## 2017-03-20 DIAGNOSIS — J449 Chronic obstructive pulmonary disease, unspecified: Secondary | ICD-10-CM | POA: Diagnosis not present

## 2017-03-20 DIAGNOSIS — E784 Other hyperlipidemia: Secondary | ICD-10-CM | POA: Diagnosis not present

## 2017-03-20 MED ORDER — LEVALBUTEROL HCL 0.63 MG/3ML IN NEBU: 0.6300 mg | INHALATION_SOLUTION | Freq: Four times a day (QID) | RESPIRATORY_TRACT | 11 refills | Status: DC | PRN

## 2017-03-20 NOTE — Telephone Encounter (Signed)
Pt requesting [facility]valbuterol ref[facility] to [facility] [address].  This has been sent.  Nothing further needed.

## 2017-03-21 ENCOUNTER — Telehealth: Payer: Self-pay | Admitting: Internal Medicine

## 2017-03-21 MED ORDER — ALBUTEROL SULFATE (2.5 MG/3ML) 0.083% IN NEBU: 2.5000 mg | INHALATION_SOLUTION | Freq: Four times a day (QID) | RESPIRATORY_TRACT | 5 refills | Status: DC

## 2017-03-21 NOTE — Telephone Encounter (Signed)
Spoke with patient. She stated that she called yesterday for a refill on her albuterol neb solution. Instead, levalbuterol was called in. Patient did not need a refill on this and accidentally picked this up from the pharmacy.  Patient called the pharmacy and the pharmacy told her that if someone from our office notifies them that it was a mistake, they will give her a refund.  Albuterol has been sent in for patient. She verbalized understanding. Nothing further needed.

## 2017-04-05 DIAGNOSIS — J449 Chronic obstructive pulmonary disease, unspecified: Secondary | ICD-10-CM | POA: Diagnosis not present

## 2017-04-14 ENCOUNTER — Telehealth: Payer: Self-pay | Admitting: Internal Medicine

## 2017-04-14 MED ORDER — DOXYCYCLINE HYCLATE 100 MG PO TABS: 100.0000 mg | ORAL_TABLET | Freq: Two times a day (BID) | ORAL | 0 refills | Status: DC

## 2017-04-14 MED ORDER — PREDNISONE 10 MG PO TABS: ORAL_TABLET | ORAL | 0 refills | Status: DC

## 2017-04-14 NOTE — Telephone Encounter (Signed)
[name] is aware of CY's recommendations and voiced her understanding. Rx for Doxycycline has been sent to preferred pharmacy. [name] also request Rx for prednisone. There are no availability next week with NP either.  CY please advise. Thanks.

## 2017-04-14 NOTE — Telephone Encounter (Signed)
Spoke with pt's daughter, [name] who states pt is experiencing sore throat that developed yesterday, increased sob, prod cough with green mucus & increased fatigued x2wk [name] states pt's O2  droped to 64% on 2L with 15 feet of exertion on Tuesday. Yesterday O2 dropped to low 80's on 2L O2 with exertion. [name] states she increased O2 to 3L and pt's O2 was then 92-95 at rest.  Denies fever, chills or sweats. Pt hasn't taken any OTC medications to help with symptoms. [name] request abx to be sent to [facility] and apt with CY next week  CY please advise. Thanks.  Current Outpatient Prescriptions on File Prior to Visit  Medication Sig Dispense Refill  . albuterol (PROAIR HFA) 108 (90 Base) MCG/ACT inhaler Inhale 2 puffs into the lungs every 4 (four) hours as needed for wheezing or shortness of breath. 1 Inhaler 11  . albuterol (PROVENTIL) (2.5 MG/3ML) 0.083% nebulizer solution Take 3 mLs (2.5 mg total) by nebulization every 6 (six) hours. 75 mL 5  . amitriptyline (ELAVIL) 10 MG tablet 1 daily at bedtime 30 tablet 0  . Ascorbic Acid (VITAMIN C) 1000 MG tablet Take 1,000 mg by mouth daily.    Marland Kitchen aspirin EC 81 MG tablet Take 81 mg by mouth at bedtime.     Marland Kitchen azithromycin (ZITHROMAX) 250 MG tablet Take as directed 6 tablet 0  . Biotin (BIOTIN MAXIMUM STRENGTH) 10 MG TABS Take 10 mg by mouth daily. 10,000 mcg    . Cholecalciferol (VITAMIN D) 2000 units tablet Take 2,000 Units by mouth daily.    . clonazePAM (KLONOPIN) 1 MG tablet Take 1 tablet (1 mg total) by mouth at bedtime. 10 tablet 0  . famotidine (PEPCID) 20 MG tablet Take 1 tablet (20 mg total) by mouth daily. 30 tablet 0  . fluticasone (FLONASE) 50 MCG/ACT nasal spray Place 2 sprays into both nostrils daily. 16 g 2  . Fluticasone-Salmeterol (ADVAIR DISKUS) 250-50 MCG/DOSE AEPB Inhale 1 puff into the lungs 2 (two) times daily. 60 each 5  . furosemide (LASIX) 40 MG tablet Take 1 tablet (40 mg total) by mouth daily as needed. 90 tablet 3  .  gabapentin (NEURONTIN) 100 MG capsule Take 1 capsule (100 mg total) by mouth 3 (three) times daily as needed (pain). 90 capsule 0  . guaiFENesin (MUCINEX) 600 MG 12 hr tablet Take 1 tablet (600 mg total) by mouth 2 (two) times daily.    Marland Kitchen ipratropium (ATROVENT) 0.02 % nebulizer solution Take 2.5 mLs (0.5 mg total) by nebulization 4 (four) times daily. 300 mL 11  . levalbuterol (XOPENEX) 0.63 MG/3ML nebulizer solution Take 3 mLs (0.63 mg total) by nebulization every 6 (six) hours as needed for wheezing or shortness of breath. 360 mL 11  . loratadine (CLARITIN) 10 MG tablet Take 1 tablet (10 mg total) by mouth daily. 30 tablet 0  . Melatonin 5 MG TABS Take 5 mg by mouth at bedtime.    . metFORMIN (GLUCOPHAGE-XR) 500 MG 24 hr tablet Take 1 tablet by mouth every morning then 2 tablets in the evening  0  . Multiple Vitamin (MULTIVITAMIN WITH MINERALS) TABS tablet Take 1 tablet by mouth daily.    . nitroGLYCERIN (NITROSTAT) 0.4 MG SL tablet Place 1 tablet (0.4 mg total) under the tongue every 5 (five) minutes as needed for chest pain. 25 tablet 6  . OXYGEN Inhale into the lungs continuous. 2 1/2 - 3 L    . pantoprazole (PROTONIX) 40 MG tablet Take  1 tablet (40 mg total) by mouth 2 (two) times daily before a meal. 60 tablet 0  . potassium chloride (K-DUR) 10 MEQ tablet Take 1 tablet (10 mEq total) by mouth daily as needed. 90 tablet 3  . predniSONE (DELTASONE) 10 MG tablet Take 4 tabs for 2 days, then 3 tabs for 2 days, 2 tabs for 2 days, then 1 tab for 2 days, then stop. 20 tablet 0  . predniSONE (DELTASONE) 10 MG tablet Take 1 tablet daily for 10 days then 1 tablet every other day until gone 20 tablet 0  . senna-docusate (SENOKOT-S) 8.6-50 MG tablet Take 1 tablet by mouth at bedtime as needed for mild constipation. 15 tablet 0  . SPIRIVA HANDIHALER 18 MCG inhalation capsule INHALE 1 CAPSULE DAILY 30 capsule 8   No current facility-administered medications on file prior to visit.     Allergies   Allergen Reactions  . Bactrim [Sulfamethoxazole-Trimethoprim] Nausea And Vomiting  . Codeine Other (See Comments)    Unknown reaction but cannot take per husband  . Vytorin [Ezetimibe-Simvastatin] Other (See Comments)    Reaction: possibly malaise per husband

## 2017-04-14 NOTE — Telephone Encounter (Signed)
Spoke with [name] and notified of recs per CDY  She verbalized understanding  Rx for pred was sent

## 2017-04-14 NOTE — Telephone Encounter (Signed)
Offer doxycycline 100 mg, # 14, 1 twice daily  If I don't have available ov next week, then offer NP.

## 2017-04-14 NOTE — Telephone Encounter (Signed)
Offer prednisone 10 mg, # 20, 4 X 2 DAYS, 3 X 2 DAYS, 2 X 2 DAYS, 1 X 2 DAYS  Since we can't offer an appointment right now, please ask them to call back next week and let us know how she is doing.

## 2017-04-14 NOTE — Telephone Encounter (Signed)
Daughter called back requesting Prednisone for the pt...ert

## 2017-04-24 ENCOUNTER — Telehealth: Payer: Self-pay | Admitting: Internal Medicine

## 2017-04-24 MED ORDER — PREDNISONE 10 MG PO TABS: ORAL_TABLET | ORAL | 0 refills | Status: DC

## 2017-04-24 NOTE — Telephone Encounter (Signed)
Ok try prednisone 10 mg, # 20, 1 daily x 10 days, then one every other day

## 2017-04-24 NOTE — Telephone Encounter (Addendum)
Called and spoke to pt's husband, Tharon Aquas. Informed him of the recs per CY. Rx sent to preferred pharmacy. Frankie verbalized understanding and denied any further questions or concerns at this time.

## 2017-04-24 NOTE — Telephone Encounter (Signed)
CY  Please Advise-  Pt's husband called in and states pt uses 2L of O2 but her sats dropped down to the 50s last night but he was unsure if this was from all the coughing and the increase sob. He states she usually drops down to the 70s when she gets up while she is exertioning , He checked her oxygen today and she was 92% on 2L, but he states she is still having increased sob, a very bad rattling cough,with bad coughing spells and chest tightness. She finished her doxycycline and prednisone you prescribed. He states they do increase her to 3L to get her oxygen level back up and then bump her back down. Husband wanted to know if there is anything to give her to comfort her, maybe be on a low dose of prednisone since her breathing is not improving.

## 2017-04-26 IMAGING — CR DG ABD PORTABLE 1V
1 series · 1 of 1 positions shown · non-contrast
Comparison: 07/17/2004

CLINICAL DATA: Orogastric tube placement

EXAM:
PORTABLE ABDOMEN - 1 VIEW

[AP]
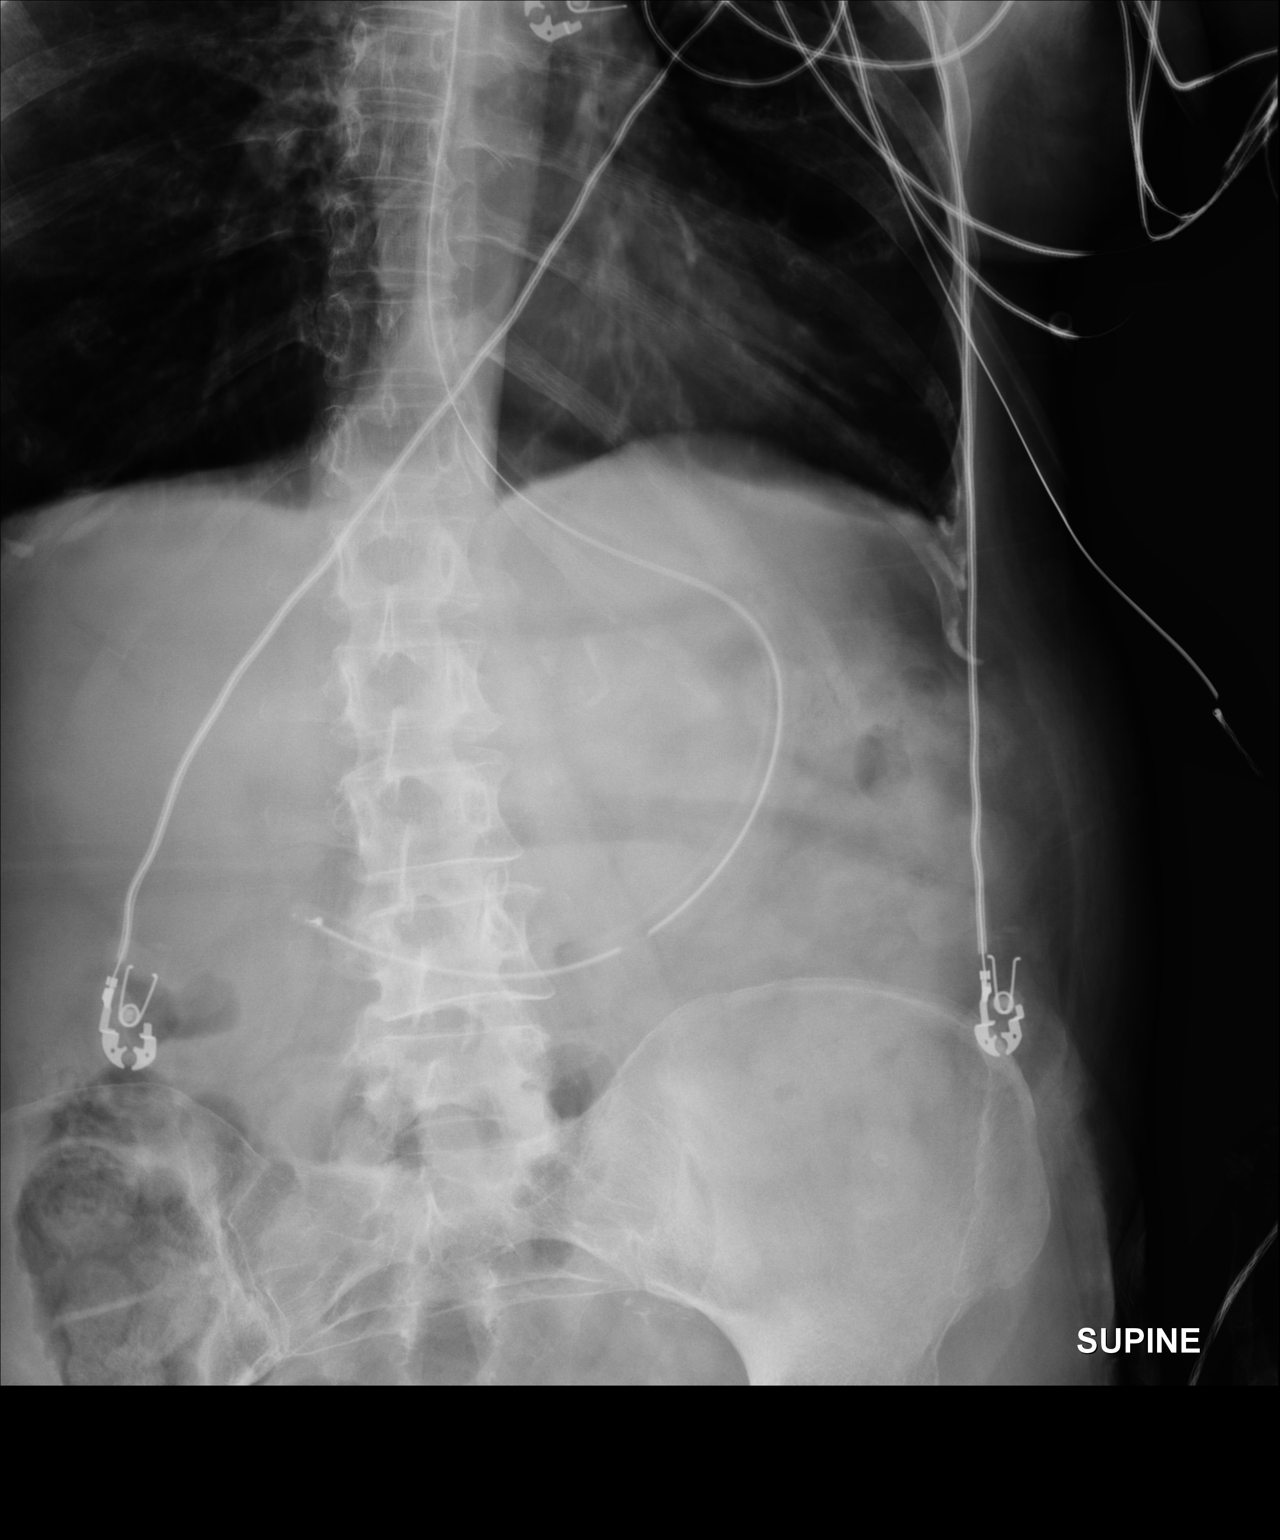

[1 of 1 positions shown; findings below may reference images not displayed]

FINDINGS: Orogastric tube passes below the diaphragm to have its tip in the
distal stomach.

Normal bowel gas pattern.
IMPRESSION: Well-positioned orogastric tube.

## 2017-04-26 IMAGING — CR DG CHEST 1V PORT
1 series · 1 of 1 positions shown · non-contrast
Comparison: 10/12/2015

CLINICAL DATA: Endotracheal tube placement

EXAM:
PORTABLE CHEST 1 VIEW

[AP]
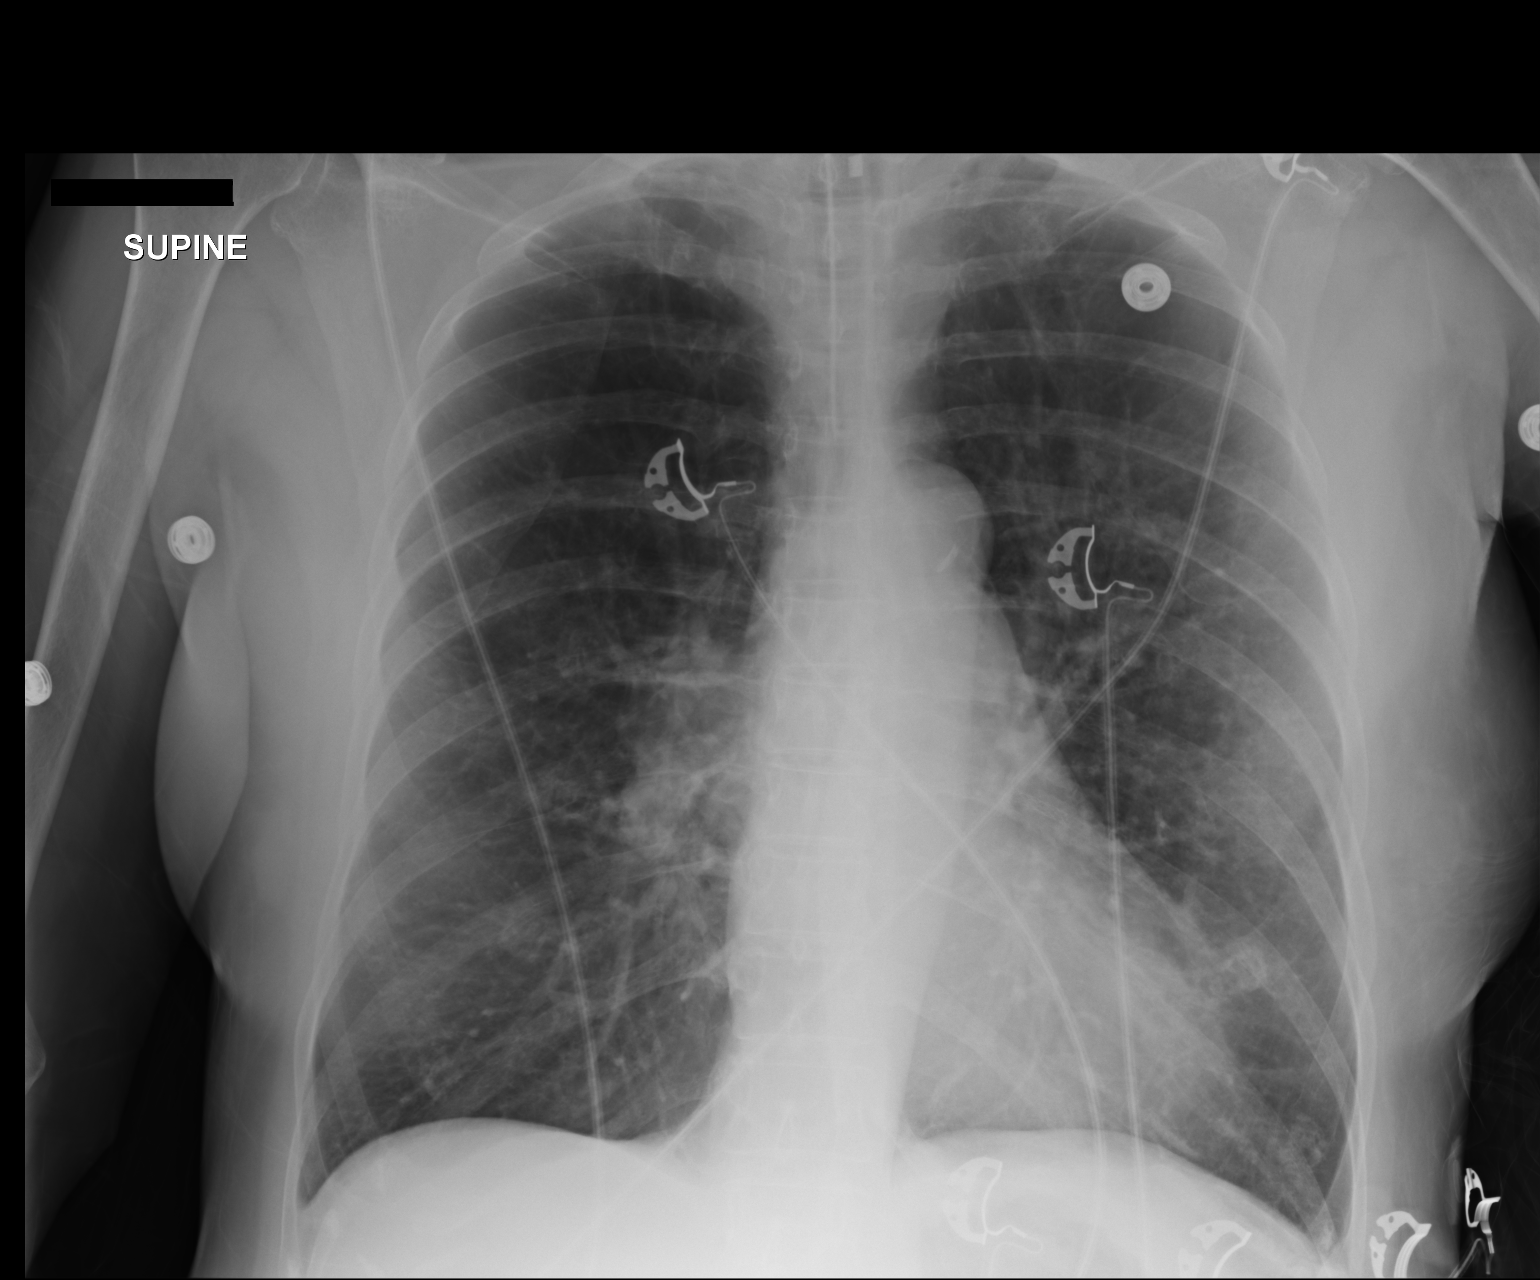

[1 of 1 positions shown; findings below may reference images not displayed]

FINDINGS: Cardiomediastinal silhouette is stable. Endotracheal tube in place
with tip 3.8 cm above the carina. Hyperinflation again noted. No
pulmonary edema. No pneumothorax. Persistent left basilar
atelectasis infiltrate or scarring.
IMPRESSION: Endotracheal tube in place. No pneumothorax. Persistent left basilar
atelectasis, infiltrate or scarring. Hyperinflation again noted.

## 2017-04-27 IMAGING — CR DG CHEST 1V PORT
2 series · 2 of 2 positions shown · non-contrast
Comparison: One day prior

CLINICAL DATA: Acute on chronic respiratory failure

EXAM:
PORTABLE CHEST 1 VIEW

[AP (1 of 2)]
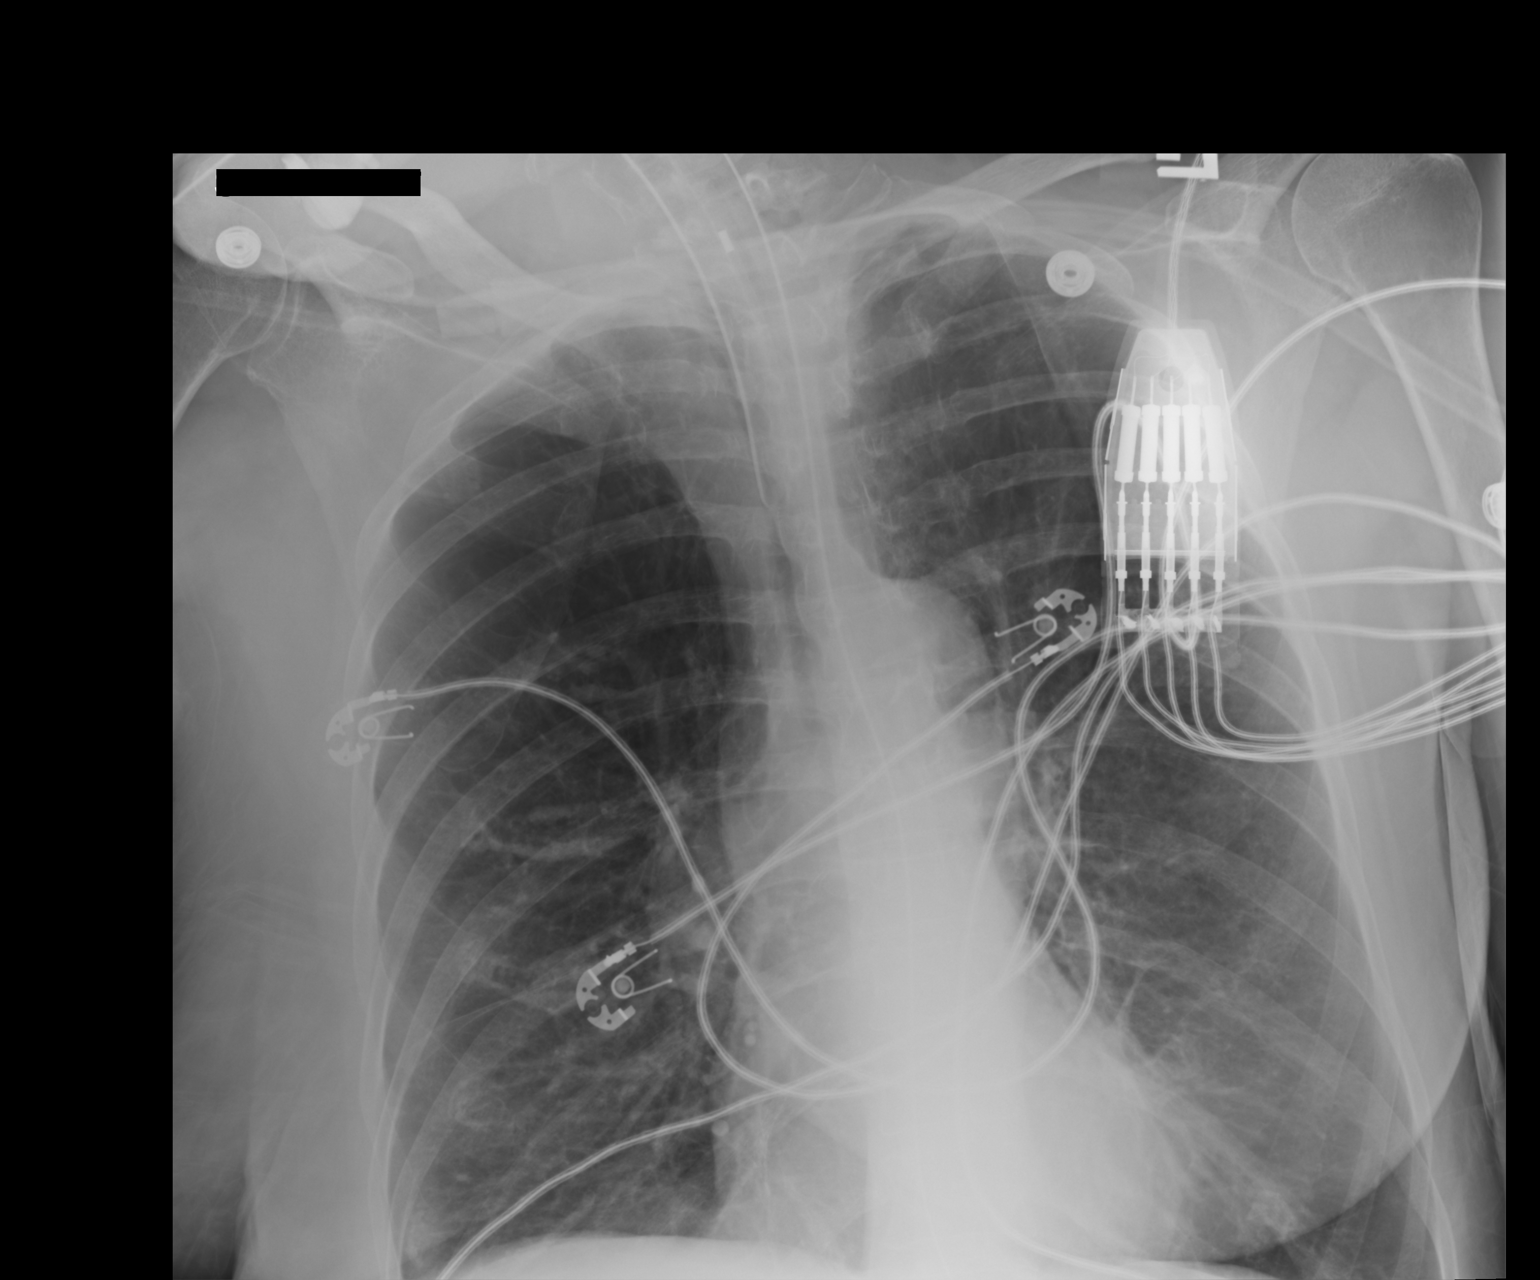

[AP (2 of 2)]
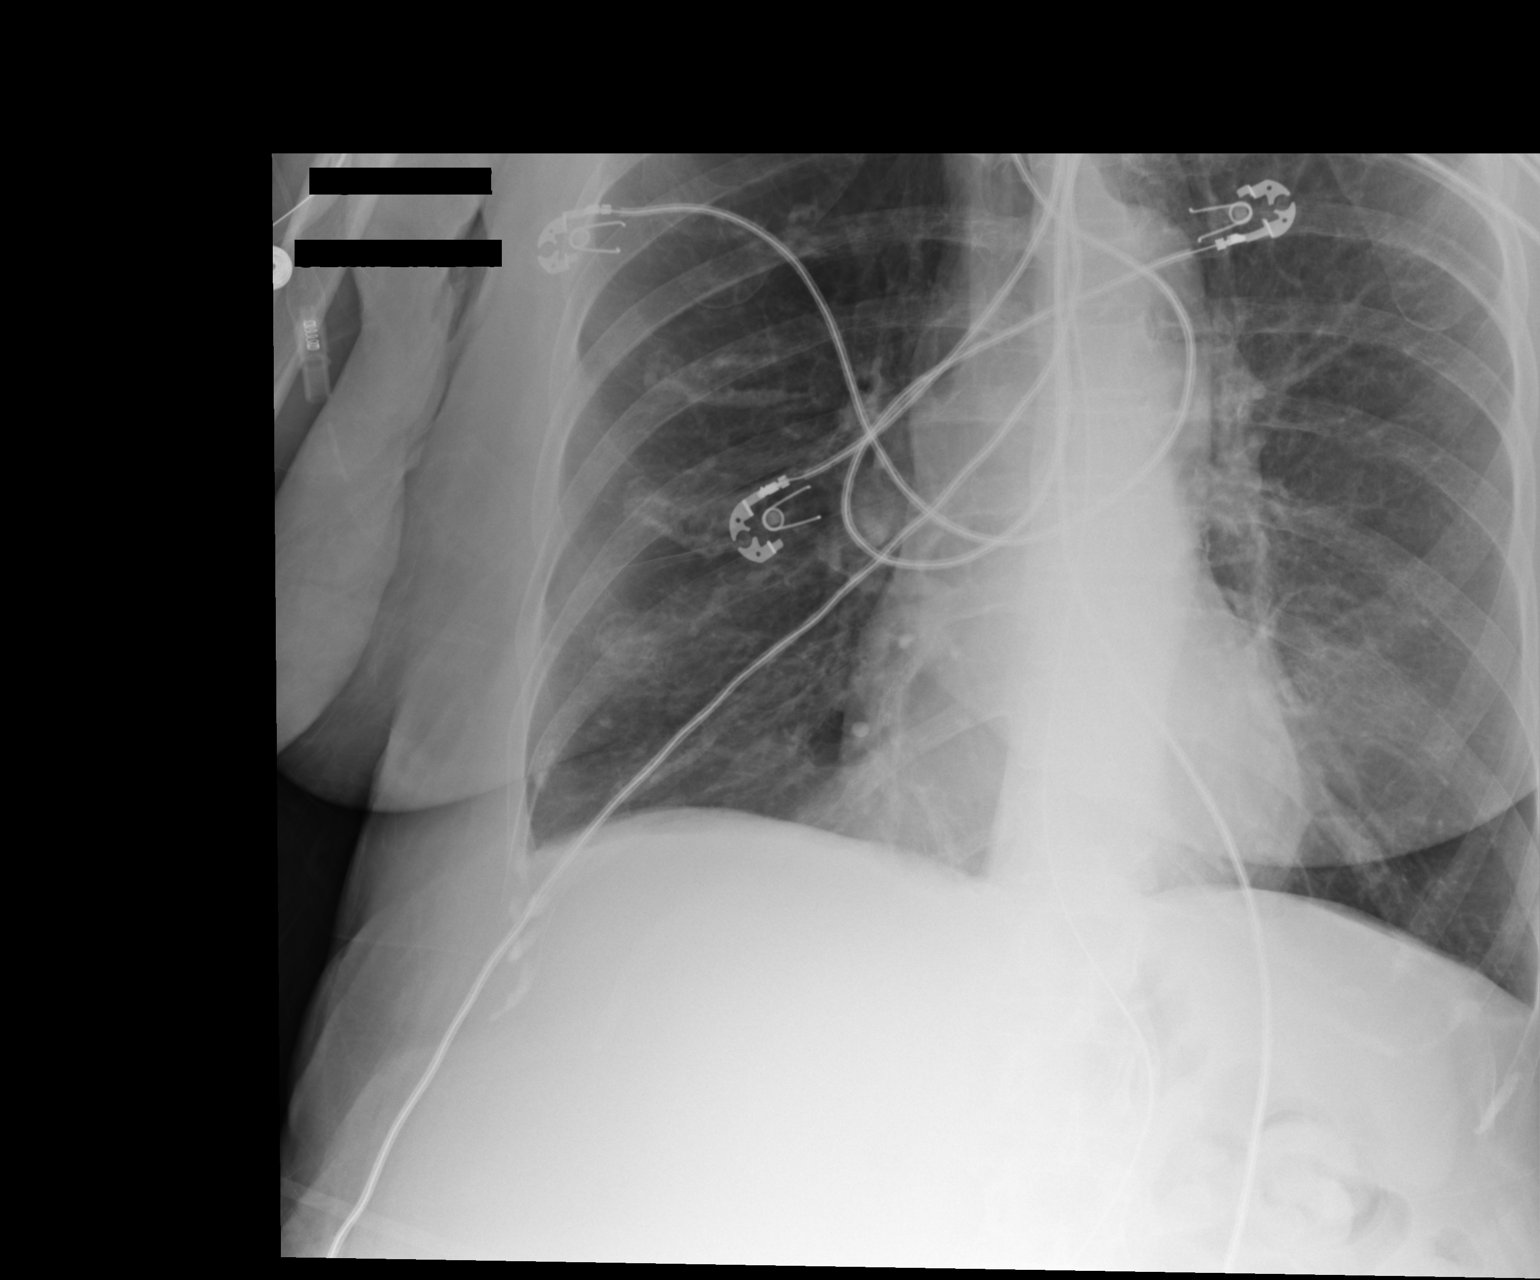

[2 of 2 positions shown; findings below may reference images not displayed]

FINDINGS: Endotracheal tube terminates 6.2 cm above carina.Nasogastric tube
extends beyond the inferior aspect of the film. Hyperinflation.
Numerous leads and wires project over the chest. Normal heart size.
Costophrenic angles excluded. No large pleural effusion. No
pneumothorax. Bibasilar scarring. No lobar consolidation.
IMPRESSION: Hyperinflation, consistent with the clinical history of COPD. No
acute superimposed process.

## 2017-05-04 ENCOUNTER — Telehealth: Payer: Self-pay | Admitting: Internal Medicine

## 2017-05-04 MED ORDER — AMOXICILLIN-POT CLAVULANATE 500-125 MG PO TABS: 1.0000 | ORAL_TABLET | Freq: Two times a day (BID) | ORAL | 0 refills | Status: DC

## 2017-05-04 NOTE — Telephone Encounter (Signed)
Suggest Augmentin 500 mg #20, 1 twice a day

## 2017-05-04 NOTE — Telephone Encounter (Signed)
Pt having increased cough x 2 weeks Creamy white/yellow mucus -- having issues getting mucus up to expel.  Pt is taking Mucinex OTC daily with plenty of water -- not helping 100%  [facility] [address]   Please advise Dr Annamaria Boots. Thanks.   Allergies as of 05/04/2017      Reactions   Bactrim [sulfamethoxazole-trimethoprim] Nausea And Vomiting   Codeine Other (See Comments)   Unknown reaction but cannot take per husband   Vytorin [ezetimibe-simvastatin] Other (See Comments)   Reaction: possibly malaise per husband      Medication List       Accurate as of 05/04/17  3:53 PM. Always use your most recent med list.          albuterol 108 (90 Base) MCG/ACT inhaler Commonly known as:  PROAIR HFA Inhale 2 puffs into the lungs every 4 (four) hours as needed for wheezing or shortness of breath.   albuterol (2.5 MG/3ML) 0.083% nebulizer solution Commonly known as:  PROVENTIL Take 3 mLs (2.5 mg total) by nebulization every 6 (six) hours.   amitriptyline 10 MG tablet Commonly known as:  ELAVIL 1 daily at bedtime   aspirin EC 81 MG tablet Take 81 mg by mouth at bedtime.   azithromycin 250 MG tablet Commonly known as:  ZITHROMAX Take as directed   BIOTIN MAXIMUM STRENGTH 10 MG Tabs Generic drug:  Biotin Take 10 mg by mouth daily. 10,000 mcg   clonazePAM 1 MG tablet Commonly known as:  KLONOPIN Take 1 tablet (1 mg total) by mouth at bedtime.   doxycycline 100 MG tablet Commonly known as:  VIBRA-TABS Take 1 tablet (100 mg total) by mouth 2 (two) times daily.   famotidine 20 MG tablet Commonly known as:  PEPCID Take 1 tablet (20 mg total) by mouth daily.   fluticasone 50 MCG/ACT nasal spray Commonly known as:  FLONASE Place 2 sprays into both nostrils daily.   Fluticasone-Salmeterol 250-50 MCG/DOSE Aepb Commonly known as:  ADVAIR DISKUS Inhale 1 puff into the lungs 2 (two) times daily.   furosemide 40 MG tablet Commonly known as:  LASIX Take 1 tablet (40 mg total) by mouth  daily as needed.   gabapentin 100 MG capsule Commonly known as:  NEURONTIN Take 1 capsule (100 mg total) by mouth 3 (three) times daily as needed (pain).   guaiFENesin 600 MG 12 hr tablet Commonly known as:  MUCINEX Take 1 tablet (600 mg total) by mouth 2 (two) times daily.   ipratropium 0.02 % nebulizer solution Commonly known as:  ATROVENT Take 2.5 mLs (0.5 mg total) by nebulization 4 (four) times daily.   levalbuterol 0.63 MG/3ML nebulizer solution Commonly known as:  XOPENEX Take 3 mLs (0.63 mg total) by nebulization every 6 (six) hours as needed for wheezing or shortness of breath.   loratadine 10 MG tablet Commonly known as:  CLARITIN Take 1 tablet (10 mg total) by mouth daily.   Melatonin 5 MG Tabs Take 5 mg by mouth at bedtime.   metFORMIN 500 MG 24 hr tablet Commonly known as:  GLUCOPHAGE-XR Take 1 tablet by mouth every morning then 2 tablets in the evening   multivitamin with minerals Tabs tablet Take 1 tablet by mouth daily.   nitroGLYCERIN 0.4 MG SL tablet Commonly known as:  NITROSTAT Place 1 tablet (0.4 mg total) under the tongue every 5 (five) minutes as needed for chest pain.   OXYGEN Inhale into the lungs continuous. 2 1/2 - 3 L   pantoprazole 40  MG tablet Commonly known as:  PROTONIX Take 1 tablet (40 mg total) by mouth 2 (two) times daily before a meal.   potassium chloride 10 MEQ tablet Commonly known as:  K-DUR Take 1 tablet (10 mEq total) by mouth daily as needed.   predniSONE 10 MG tablet Commonly known as:  DELTASONE Take 4 tabs for 2 days, then 3 tabs for 2 days, 2 tabs for 2 days, then 1 tab for 2 days, then stop.   predniSONE 10 MG tablet Commonly known as:  DELTASONE Take 1 tablet daily for 10 days then 1 tablet every other day until gone   predniSONE 10 MG tablet Commonly known as:  DELTASONE 4 x 2 days, 3 x 2 days, 2 x 2 days, then stop   predniSONE 10 MG tablet Commonly known as:  DELTASONE Take 1 tablet daily for 10 days,  then 1 tablet every other day till gone.   senna-docusate 8.6-50 MG tablet Commonly known as:  Senokot-S Take 1 tablet by mouth at bedtime as needed for mild constipation.   SPIRIVA HANDIHALER 18 MCG inhalation capsule Generic drug:  tiotropium INHALE 1 CAPSULE DAILY   vitamin C 1000 MG tablet Take 1,000 mg by mouth daily.   Vitamin D 2000 units tablet Take 2,000 Units by mouth daily.

## 2017-05-04 NOTE — Telephone Encounter (Signed)
Spoke with pt's daughter, Torrie. She is aware of CY's recommendations. Rx has been sent in. Nothing further was needed.

## 2017-05-06 DIAGNOSIS — J449 Chronic obstructive pulmonary disease, unspecified: Secondary | ICD-10-CM | POA: Diagnosis not present

## 2017-05-16 ENCOUNTER — Telehealth: Payer: Self-pay | Admitting: Internal Medicine

## 2017-05-16 MED ORDER — DOXYCYCLINE HYCLATE 100 MG PO TABS: 100.0000 mg | ORAL_TABLET | Freq: Two times a day (BID) | ORAL | 0 refills | Status: DC

## 2017-05-16 MED ORDER — PREDNISONE 10 MG PO TABS: ORAL_TABLET | ORAL | 0 refills | Status: DC

## 2017-05-16 NOTE — Telephone Encounter (Signed)
Since augmentin is done and she is struggling, suggest doxycycline 100 mg, # 14, 1 twice daily  Taper prednisone down to continue a maintenance dose of 10 mg daily.

## 2017-05-16 NOTE — Telephone Encounter (Signed)
Spoke with patient's husband. He stated that she has been taking Augmentin and Prednisone 10mg . He stated that within the last 2 days, her O2 levels have dropped to the upper 70s with 1.5L of O2. He stated that in order for her O2 to recover to the lower 90s, he is having to increase her O2 to 3L. She is not complaining of any chest tightness or congestion. Denied any signs of a fever or chills. Advised him that if she O2 levels continue to drop and remain in the 70s, she will need to go to the hospital.  He just wanted to know CY recommends for her.    CY, please advise. Thanks.

## 2017-05-16 NOTE — Telephone Encounter (Signed)
[name] is aware of Rx and suggestion from Hilda. Rx has been sent to [facility]. [name] is aware to take pt to ED if patient continues to get worse even while taking abx and prednisone.

## 2017-05-16 NOTE — Telephone Encounter (Signed)
Pt son advised of recommendations per CY.   Prednisone will be sent to pharmacy.   Pt completed Augmentin about 2 days ago -- will send to CY as FYI.    Dr Annamaria Boots please advise if the patient is stopping at 10mg  or continuing therapy of 10mg  daily after taper is done. Thanks.

## 2017-05-16 NOTE — Telephone Encounter (Signed)
Use the nebulizer every 4-6 hours.  Take prednisone 40 mg daily x 4 days, then start tapering by 10 mg every 2 days  (We can send Rx 10 mg, # 40, take as directed)  Finish the antibiotic  If this doesn't get her fixed up, then go to ER.

## 2017-05-28 ENCOUNTER — Other Ambulatory Visit: Payer: Self-pay | Admitting: Internal Medicine

## 2017-05-29 ENCOUNTER — Telehealth: Payer: Self-pay | Admitting: Internal Medicine

## 2017-05-29 MED ORDER — ALBUTEROL SULFATE HFA 108 (90 BASE) MCG/ACT IN AERS: 2.0000 | INHALATION_SPRAY | RESPIRATORY_TRACT | 11 refills | Status: AC | PRN

## 2017-05-29 NOTE — Telephone Encounter (Signed)
Spoke with pt. She is needing a refill on Albuterol HFA. Rx has been sent in. Nothing further was needed. 

## 2017-05-31 ENCOUNTER — Telehealth: Payer: Self-pay | Admitting: Internal Medicine

## 2017-05-31 MED ORDER — PREDNISONE 10 MG PO TABS: ORAL_TABLET | ORAL | 0 refills | Status: DC

## 2017-05-31 MED ORDER — AMOXICILLIN 500 MG PO TABS: 500.0000 mg | ORAL_TABLET | Freq: Two times a day (BID) | ORAL | 0 refills | Status: DC

## 2017-05-31 NOTE — Telephone Encounter (Signed)
If it is yellow, suggest amoxacillin 500 mg, # 14, 1 twice daily                                    Prednisone 20 mg # 5, 1 daily                                    Mucinex and stay well hydrated

## 2017-05-31 NOTE — Telephone Encounter (Signed)
Spoke with [name], patients husband, pt is still having increased weakness and light yellow thick and sticky mucus production x 2-3 days. Pt having decreased appetite. She is hurting in her back and all over. Taking Aleve gels for pain. Pt c/o headache. Pt has been drinking Boost. Any recommendations for symptom relief?  Please advise Dr Annamaria Boots. Thanks.   Allergies as of 05/31/2017      Reactions   Bactrim [sulfamethoxazole-trimethoprim] Nausea And Vomiting   Codeine Other (See Comments)   Unknown reaction but cannot take per husband   Vytorin [ezetimibe-simvastatin] Other (See Comments)   Reaction: possibly malaise per husband      Medication List       Accurate as of 05/31/17 12:41 PM. Always use your most recent med list.          albuterol (2.5 MG/3ML) 0.083% nebulizer solution Commonly known as:  PROVENTIL Take 3 mLs (2.5 mg total) by nebulization every 6 (six) hours.   albuterol (2.5 MG/3ML) 0.083% nebulizer solution Commonly known as:  PROVENTIL USE 1 VIAL IN NEBULIZER EVERY 6 HOURS FOR WHEEZING   albuterol 108 (90 Base) MCG/ACT inhaler Commonly known as:  PROAIR HFA Inhale 2 puffs into the lungs every 4 (four) hours as needed for wheezing or shortness of breath.   amitriptyline 10 MG tablet Commonly known as:  ELAVIL 1 daily at bedtime   amoxicillin-clavulanate 500-125 MG tablet Commonly known as:  AUGMENTIN Take 1 tablet (500 mg total) by mouth 2 (two) times daily.   aspirin EC 81 MG tablet Take 81 mg by mouth at bedtime.   azithromycin 250 MG tablet Commonly known as:  ZITHROMAX Take as directed   BIOTIN MAXIMUM STRENGTH 10 MG Tabs Generic drug:  Biotin Take 10 mg by mouth daily. 10,000 mcg   clonazePAM 1 MG tablet Commonly known as:  KLONOPIN Take 1 tablet (1 mg total) by mouth at bedtime.   doxycycline 100 MG tablet Commonly known as:  VIBRA-TABS Take 1 tablet (100 mg total) by mouth 2 (two) times daily.   doxycycline 100 MG tablet Commonly known  as:  VIBRA-TABS Take 1 tablet (100 mg total) by mouth 2 (two) times daily.   famotidine 20 MG tablet Commonly known as:  PEPCID Take 1 tablet (20 mg total) by mouth daily.   fluticasone 50 MCG/ACT nasal spray Commonly known as:  FLONASE Place 2 sprays into both nostrils daily.   Fluticasone-Salmeterol 250-50 MCG/DOSE Aepb Commonly known as:  ADVAIR DISKUS Inhale 1 puff into the lungs 2 (two) times daily.   furosemide 40 MG tablet Commonly known as:  LASIX Take 1 tablet (40 mg total) by mouth daily as needed.   gabapentin 100 MG capsule Commonly known as:  NEURONTIN Take 1 capsule (100 mg total) by mouth 3 (three) times daily as needed (pain).   guaiFENesin 600 MG 12 hr tablet Commonly known as:  MUCINEX Take 1 tablet (600 mg total) by mouth 2 (two) times daily.   ipratropium 0.02 % nebulizer solution Commonly known as:  ATROVENT Take 2.5 mLs (0.5 mg total) by nebulization 4 (four) times daily.   levalbuterol 0.63 MG/3ML nebulizer solution Commonly known as:  XOPENEX Take 3 mLs (0.63 mg total) by nebulization every 6 (six) hours as needed for wheezing or shortness of breath.   loratadine 10 MG tablet Commonly known as:  CLARITIN Take 1 tablet (10 mg total) by mouth daily.   Melatonin 5 MG Tabs Take 5 mg by mouth at bedtime.   metFORMIN  500 MG 24 hr tablet Commonly known as:  GLUCOPHAGE-XR Take 1 tablet by mouth every morning then 2 tablets in the evening   multivitamin with minerals Tabs tablet Take 1 tablet by mouth daily.   nitroGLYCERIN 0.4 MG SL tablet Commonly known as:  NITROSTAT Place 1 tablet (0.4 mg total) under the tongue every 5 (five) minutes as needed for chest pain.   OXYGEN Inhale into the lungs continuous. 2 1/2 - 3 L   pantoprazole 40 MG tablet Commonly known as:  PROTONIX Take 1 tablet (40 mg total) by mouth 2 (two) times daily before a meal.   potassium chloride 10 MEQ tablet Commonly known as:  K-DUR Take 1 tablet (10 mEq total) by  mouth daily as needed.   predniSONE 10 MG tablet Commonly known as:  DELTASONE Take 4 tabs for 2 days, then 3 tabs for 2 days, 2 tabs for 2 days, then 1 tab for 2 days, then stop.   predniSONE 10 MG tablet Commonly known as:  DELTASONE Take 1 tablet daily for 10 days then 1 tablet every other day until gone   predniSONE 10 MG tablet Commonly known as:  DELTASONE 4 x 2 days, 3 x 2 days, 2 x 2 days, then stop   predniSONE 10 MG tablet Commonly known as:  DELTASONE Take 1 tablet daily for 10 days, then 1 tablet every other day till gone.   predniSONE 10 MG tablet Commonly known as:  DELTASONE Take 40mg  daily for 4 days, then take 30mg  x 2 days, then 20mg  x 2 days then 10mg  QD   senna-docusate 8.6-50 MG tablet Commonly known as:  Senokot-S Take 1 tablet by mouth at bedtime as needed for mild constipation.   SPIRIVA HANDIHALER 18 MCG inhalation capsule Generic drug:  tiotropium INHALE 1 CAPSULE DAILY   vitamin C 1000 MG tablet Take 1,000 mg by mouth daily.   Vitamin D 2000 units tablet Take 2,000 Units by mouth daily.

## 2017-05-31 NOTE — Telephone Encounter (Signed)
Spoke with patient's husband. He is aware of CY's recs. Verified that pt was not still taking Doxy 100 since it was still on her list. She is not. Will go ahead and call in the medications to [facility] on [address]. Nothing else was needed at time of call.

## 2017-06-01 ENCOUNTER — Telehealth: Payer: Self-pay | Admitting: Internal Medicine

## 2017-06-01 MED ORDER — ONDANSETRON HCL 4 MG PO TABS: 4.0000 mg | ORAL_TABLET | Freq: Three times a day (TID) | ORAL | 0 refills | Status: AC | PRN

## 2017-06-01 NOTE — Telephone Encounter (Signed)
Spoke with patient's daughter. She states that the patient is still nauseous and now has a yeast infection from alll of the antibiotics that she has been on. Per the daughter, this is the 5th round of medication. I have scheduled the patient with CY for Sept 4th at 10am.   In the meanwhile, pt's daughter is requesting something for the nausea.   [facility] uses [facility] on [address]   CY, please advise. Thanks!

## 2017-06-01 NOTE — Telephone Encounter (Signed)
Spoke with pt's daughter, aware of recs.  rx sent to preferred pharmacy.  Nothing further needed.  

## 2017-06-01 NOTE — Telephone Encounter (Signed)
Suggest Zofran 4 mg, # 15       1 every 8 hours if needed for nausea Ok to use a topical otc product for yeast

## 2017-06-02 ENCOUNTER — Encounter (HOSPITAL_COMMUNITY): Payer: Self-pay | Admitting: *Deleted

## 2017-06-02 ENCOUNTER — Inpatient Hospital Stay (HOSPITAL_COMMUNITY)
Admission: EM | Admit: 2017-06-02 | Discharge: 2017-06-07 | DRG: 189 | Disposition: A | Discharge status: Hospice | Payer: Medicare Other | Attending: Internal Medicine | Admitting: Internal Medicine

## 2017-06-02 ENCOUNTER — Emergency Department (HOSPITAL_COMMUNITY): Payer: Medicare Other

## 2017-06-02 ENCOUNTER — Telehealth: Payer: Self-pay | Admitting: Internal Medicine

## 2017-06-02 DIAGNOSIS — Z6821 Body mass index (BMI) 21.0-21.9, adult: Secondary | ICD-10-CM

## 2017-06-02 DIAGNOSIS — Z7984 Long term (current) use of oral hypoglycemic drugs: Secondary | ICD-10-CM

## 2017-06-02 DIAGNOSIS — E440 Moderate protein-calorie malnutrition: Secondary | ICD-10-CM | POA: Diagnosis not present

## 2017-06-02 DIAGNOSIS — J441 Chronic obstructive pulmonary disease with (acute) exacerbation: Secondary | ICD-10-CM | POA: Diagnosis present

## 2017-06-02 DIAGNOSIS — E119 Type 2 diabetes mellitus without complications: Secondary | ICD-10-CM | POA: Diagnosis not present

## 2017-06-02 DIAGNOSIS — Z515 Encounter for palliative care: Secondary | ICD-10-CM | POA: Diagnosis not present

## 2017-06-02 DIAGNOSIS — F17290 Nicotine dependence, other tobacco product, uncomplicated: Secondary | ICD-10-CM | POA: Diagnosis present

## 2017-06-02 DIAGNOSIS — E43 Unspecified severe protein-calorie malnutrition: Secondary | ICD-10-CM | POA: Diagnosis not present

## 2017-06-02 DIAGNOSIS — J449 Chronic obstructive pulmonary disease, unspecified: Secondary | ICD-10-CM | POA: Diagnosis not present

## 2017-06-02 DIAGNOSIS — I252 Old myocardial infarction: Secondary | ICD-10-CM | POA: Diagnosis not present

## 2017-06-02 DIAGNOSIS — Z79899 Other long term (current) drug therapy: Secondary | ICD-10-CM

## 2017-06-02 DIAGNOSIS — I10 Essential (primary) hypertension: Secondary | ICD-10-CM | POA: Diagnosis present

## 2017-06-02 DIAGNOSIS — F329 Major depressive disorder, single episode, unspecified: Secondary | ICD-10-CM | POA: Diagnosis present

## 2017-06-02 DIAGNOSIS — R0602 Shortness of breath: Secondary | ICD-10-CM | POA: Diagnosis not present

## 2017-06-02 DIAGNOSIS — Z7982 Long term (current) use of aspirin: Secondary | ICD-10-CM | POA: Diagnosis not present

## 2017-06-02 DIAGNOSIS — I5032 Chronic diastolic (congestive) heart failure: Secondary | ICD-10-CM | POA: Diagnosis not present

## 2017-06-02 DIAGNOSIS — R911 Solitary pulmonary nodule: Secondary | ICD-10-CM | POA: Diagnosis not present

## 2017-06-02 DIAGNOSIS — T380X5A Adverse effect of glucocorticoids and synthetic analogues, initial encounter: Secondary | ICD-10-CM | POA: Diagnosis not present

## 2017-06-02 DIAGNOSIS — Z7951 Long term (current) use of inhaled steroids: Secondary | ICD-10-CM

## 2017-06-02 DIAGNOSIS — Z66 Do not resuscitate: Secondary | ICD-10-CM | POA: Diagnosis not present

## 2017-06-02 DIAGNOSIS — K5900 Constipation, unspecified: Secondary | ICD-10-CM | POA: Diagnosis present

## 2017-06-02 DIAGNOSIS — F32A Depression, unspecified: Secondary | ICD-10-CM | POA: Diagnosis present

## 2017-06-02 DIAGNOSIS — Z955 Presence of coronary angioplasty implant and graft: Secondary | ICD-10-CM

## 2017-06-02 DIAGNOSIS — K219 Gastro-esophageal reflux disease without esophagitis: Secondary | ICD-10-CM | POA: Diagnosis not present

## 2017-06-02 DIAGNOSIS — Z9981 Dependence on supplemental oxygen: Secondary | ICD-10-CM

## 2017-06-02 DIAGNOSIS — I110 Hypertensive heart disease with heart failure: Secondary | ICD-10-CM | POA: Diagnosis not present

## 2017-06-02 DIAGNOSIS — Z882 Allergy status to sulfonamides status: Secondary | ICD-10-CM

## 2017-06-02 DIAGNOSIS — E1165 Type 2 diabetes mellitus with hyperglycemia: Secondary | ICD-10-CM | POA: Diagnosis present

## 2017-06-02 DIAGNOSIS — Z87891 Personal history of nicotine dependence: Secondary | ICD-10-CM | POA: Diagnosis not present

## 2017-06-02 DIAGNOSIS — F418 Other specified anxiety disorders: Secondary | ICD-10-CM | POA: Diagnosis present

## 2017-06-02 DIAGNOSIS — Z885 Allergy status to narcotic agent status: Secondary | ICD-10-CM

## 2017-06-02 DIAGNOSIS — F17201 Nicotine dependence, unspecified, in remission: Secondary | ICD-10-CM | POA: Diagnosis not present

## 2017-06-02 DIAGNOSIS — J9620 Acute and chronic respiratory failure, unspecified whether with hypoxia or hypercapnia: Secondary | ICD-10-CM | POA: Diagnosis present

## 2017-06-02 DIAGNOSIS — I2510 Atherosclerotic heart disease of native coronary artery without angina pectoris: Secondary | ICD-10-CM | POA: Diagnosis present

## 2017-06-02 DIAGNOSIS — J9621 Acute and chronic respiratory failure with hypoxia: Principal | ICD-10-CM | POA: Diagnosis present

## 2017-06-02 DIAGNOSIS — E875 Hyperkalemia: Secondary | ICD-10-CM | POA: Diagnosis present

## 2017-06-02 DIAGNOSIS — J9622 Acute and chronic respiratory failure with hypercapnia: Secondary | ICD-10-CM | POA: Diagnosis present

## 2017-06-02 DIAGNOSIS — R0902 Hypoxemia: Secondary | ICD-10-CM | POA: Diagnosis not present

## 2017-06-02 DIAGNOSIS — Z888 Allergy status to other drugs, medicaments and biological substances status: Secondary | ICD-10-CM

## 2017-06-02 LAB — COMPREHENSIVE METABOLIC PANEL
ALT: 15 U/L (ref 14–54)
ANION GAP: 11 (ref 5–15)
AST: 17 U/L (ref 15–41)
Albumin: 3.3 g/dL — ABNORMAL LOW (ref 3.5–5.0)
Alkaline Phosphatase: 64 U/L (ref 38–126)
BUN: 16 mg/dL (ref 6–20)
CHLORIDE: 95 mmol/L — AB (ref 101–111)
CO2: 36 mmol/L — AB (ref 22–32)
Calcium: 9 mg/dL (ref 8.9–10.3)
Creatinine, Ser: 0.66 mg/dL (ref 0.44–1.00)
Glucose, Bld: 237 mg/dL — ABNORMAL HIGH (ref 65–99)
Potassium: 4.6 mmol/L (ref 3.5–5.1)
SODIUM: 142 mmol/L (ref 135–145)
Total Bilirubin: 0.7 mg/dL (ref 0.3–1.2)
Total Protein: 6.5 g/dL (ref 6.5–8.1)

## 2017-06-02 LAB — CBC WITH DIFFERENTIAL/PLATELET
BASOS PCT: 1 %
Basophils Absolute: 0.1 10*3/uL (ref 0.0–0.1)
EOS PCT: 3 %
Eosinophils Absolute: 0.3 10*3/uL (ref 0.0–0.7)
HEMATOCRIT: 40.3 % (ref 36.0–46.0)
Hemoglobin: 12.3 g/dL (ref 12.0–15.0)
Lymphocytes Relative: 14 %
Lymphs Abs: 1.7 10*3/uL (ref 0.7–4.0)
MCH: 31.7 pg (ref 26.0–34.0)
MCHC: 30.5 g/dL (ref 30.0–36.0)
MCV: 103.9 fL — ABNORMAL HIGH (ref 78.0–100.0)
MONO ABS: 1.5 10*3/uL — AB (ref 0.1–1.0)
MONOS PCT: 12 %
Neutro Abs: 8.4 10*3/uL — ABNORMAL HIGH (ref 1.7–7.7)
Neutrophils Relative %: 70 %
Platelets: 258 10*3/uL (ref 150–400)
RBC: 3.88 MIL/uL (ref 3.87–5.11)
RDW: 13.8 % (ref 11.5–15.5)
WBC: 12 10*3/uL — ABNORMAL HIGH (ref 4.0–10.5)

## 2017-06-02 LAB — TROPONIN I: Troponin I: <0.03 ng/mL (ref <0.03)

## 2017-06-02 LAB — I-STAT TROPONIN, ED: TROPONIN I, POC: 0.02 ng/mL (ref 0.00–0.08)

## 2017-06-02 LAB — HEMOGLOBIN A1C
HEMOGLOBIN A1C: 7.4 % — AB (ref 4.8–5.6)
Mean Plasma Glucose: 165.68 mg/dL

## 2017-06-02 LAB — BRAIN NATRIURETIC PEPTIDE: B NATRIURETIC PEPTIDE 5: 38.8 pg/mL (ref 0.0–100.0)

## 2017-06-02 MED ORDER — ENOXAPARIN SODIUM 40 MG/0.4ML ~~LOC~~ SOLN
40.0000 mg | SUBCUTANEOUS | Status: DC
  Administered 2017-06-02 – 2017-06-06 (×5): 40 mg via SUBCUTANEOUS
  Filled 2017-06-02 (×5): qty 0.4

## 2017-06-02 MED ORDER — ALBUTEROL SULFATE (2.5 MG/3ML) 0.083% IN NEBU
INHALATION_SOLUTION | RESPIRATORY_TRACT | Status: AC
  Administered 2017-06-02: 10 mg
  Filled 2017-06-02: qty 12

## 2017-06-02 MED ORDER — TIOTROPIUM BROMIDE MONOHYDRATE 18 MCG IN CAPS
18.0000 ug | ORAL_CAPSULE | Freq: Every day | RESPIRATORY_TRACT | Status: DC
  Administered 2017-06-02: 18 ug via RESPIRATORY_TRACT
  Filled 2017-06-02: qty 5

## 2017-06-02 MED ORDER — ASPIRIN EC 81 MG PO TBEC
81.0000 mg | DELAYED_RELEASE_TABLET | Freq: Every day | ORAL | Status: DC
  Administered 2017-06-02 – 2017-06-06 (×5): 81 mg via ORAL
  Filled 2017-06-02 (×5): qty 1

## 2017-06-02 MED ORDER — INSULIN ASPART 100 UNIT/ML ~~LOC~~ SOLN
15.0000 [IU] | Freq: Once | SUBCUTANEOUS | Status: AC
  Administered 2017-06-02: 15 [IU] via SUBCUTANEOUS

## 2017-06-02 MED ORDER — IPRATROPIUM BROMIDE 0.02 % IN SOLN
0.5000 mg | RESPIRATORY_TRACT | Status: DC
  Administered 2017-06-02 – 2017-06-03 (×3): 0.5 mg via RESPIRATORY_TRACT
  Filled 2017-06-02 (×3): qty 2.5

## 2017-06-02 MED ORDER — MOMETASONE FURO-FORMOTEROL FUM 200-5 MCG/ACT IN AERO
2.0000 | INHALATION_SPRAY | Freq: Two times a day (BID) | RESPIRATORY_TRACT | Status: DC
  Administered 2017-06-02 – 2017-06-07 (×10): 2 via RESPIRATORY_TRACT
  Filled 2017-06-02: qty 8.8

## 2017-06-02 MED ORDER — INSULIN ASPART 100 UNIT/ML ~~LOC~~ SOLN
0.0000 [IU] | Freq: Three times a day (TID) | SUBCUTANEOUS | Status: DC
  Administered 2017-06-03: 5 [IU] via SUBCUTANEOUS
  Administered 2017-06-03 – 2017-06-04 (×3): 3 [IU] via SUBCUTANEOUS
  Administered 2017-06-04: 7 [IU] via SUBCUTANEOUS
  Administered 2017-06-04: 5 [IU] via SUBCUTANEOUS
  Administered 2017-06-05: 2 [IU] via SUBCUTANEOUS
  Administered 2017-06-05: 5 [IU] via SUBCUTANEOUS
  Administered 2017-06-05: 3 [IU] via SUBCUTANEOUS
  Administered 2017-06-06: 2 [IU] via SUBCUTANEOUS
  Administered 2017-06-06: 1 [IU] via SUBCUTANEOUS
  Administered 2017-06-06: 3 [IU] via SUBCUTANEOUS
  Administered 2017-06-07: 5 [IU] via SUBCUTANEOUS

## 2017-06-02 MED ORDER — SENNOSIDES-DOCUSATE SODIUM 8.6-50 MG PO TABS: 1.0000 | ORAL_TABLET | Freq: Every evening | ORAL | Status: DC | PRN

## 2017-06-02 MED ORDER — NITROGLYCERIN 0.4 MG SL SUBL: 0.4000 mg | SUBLINGUAL_TABLET | SUBLINGUAL | Status: DC | PRN

## 2017-06-02 MED ORDER — VITAMIN D 1000 UNITS PO TABS
2000.0000 [IU] | ORAL_TABLET | Freq: Every day | ORAL | Status: DC
  Administered 2017-06-02 – 2017-06-07 (×6): 2000 [IU] via ORAL
  Filled 2017-06-02 (×6): qty 2

## 2017-06-02 MED ORDER — INSULIN ASPART 100 UNIT/ML ~~LOC~~ SOLN
0.0000 [IU] | Freq: Every day | SUBCUTANEOUS | Status: DC
  Administered 2017-06-03: 3 [IU] via SUBCUTANEOUS
  Administered 2017-06-05: 4 [IU] via SUBCUTANEOUS

## 2017-06-02 MED ORDER — METHYLPREDNISOLONE SODIUM SUCC 125 MG IJ SOLR
80.0000 mg | Freq: Two times a day (BID) | INTRAMUSCULAR | Status: DC
  Administered 2017-06-02 – 2017-06-07 (×10): 80 mg via INTRAVENOUS
  Filled 2017-06-02 (×10): qty 2

## 2017-06-02 MED ORDER — ACETAMINOPHEN 650 MG RE SUPP: 650.0000 mg | Freq: Four times a day (QID) | RECTAL | Status: DC | PRN

## 2017-06-02 MED ORDER — FAMOTIDINE 20 MG PO TABS
20.0000 mg | ORAL_TABLET | Freq: Every day | ORAL | Status: DC
  Administered 2017-06-02 – 2017-06-03 (×2): 20 mg via ORAL
  Filled 2017-06-02 (×2): qty 1

## 2017-06-02 MED ORDER — GABAPENTIN 100 MG PO CAPS
100.0000 mg | ORAL_CAPSULE | Freq: Three times a day (TID) | ORAL | Status: DC | PRN
  Administered 2017-06-05 – 2017-06-06 (×3): 100 mg via ORAL
  Filled 2017-06-02 (×3): qty 1

## 2017-06-02 MED ORDER — ALBUTEROL SULFATE (2.5 MG/3ML) 0.083% IN NEBU: 2.5000 mg | INHALATION_SOLUTION | RESPIRATORY_TRACT | Status: DC | PRN

## 2017-06-02 MED ORDER — FUROSEMIDE 20 MG PO TABS
40.0000 mg | ORAL_TABLET | Freq: Every day | ORAL | Status: DC
  Administered 2017-06-03 – 2017-06-07 (×5): 40 mg via ORAL
  Filled 2017-06-02 (×5): qty 2

## 2017-06-02 MED ORDER — ACETAMINOPHEN 325 MG PO TABS: 650.0000 mg | ORAL_TABLET | Freq: Four times a day (QID) | ORAL | Status: DC | PRN

## 2017-06-02 MED ORDER — AMITRIPTYLINE HCL 10 MG PO TABS
10.0000 mg | ORAL_TABLET | Freq: Every evening | ORAL | Status: DC | PRN
  Filled 2017-06-02: qty 1

## 2017-06-02 MED ORDER — AMOXICILLIN 500 MG PO TABS: 500.0000 mg | ORAL_TABLET | Freq: Two times a day (BID) | ORAL | Status: DC

## 2017-06-02 MED ORDER — SODIUM CHLORIDE 0.9 % IV SOLN
INTRAVENOUS | Status: DC
  Administered 2017-06-02: 19:00:00 via INTRAVENOUS

## 2017-06-02 MED ORDER — GUAIFENESIN ER 600 MG PO TB12
600.0000 mg | ORAL_TABLET | Freq: Two times a day (BID) | ORAL | Status: DC
  Administered 2017-06-02 – 2017-06-07 (×10): 600 mg via ORAL
  Filled 2017-06-02 (×10): qty 1

## 2017-06-02 MED ORDER — ALBUTEROL SULFATE (2.5 MG/3ML) 0.083% IN NEBU
2.5000 mg | INHALATION_SOLUTION | RESPIRATORY_TRACT | Status: DC
  Administered 2017-06-02 – 2017-06-03 (×3): 2.5 mg via RESPIRATORY_TRACT
  Filled 2017-06-02 (×3): qty 3

## 2017-06-02 MED ORDER — CLONAZEPAM 0.5 MG PO TABS
1.0000 mg | ORAL_TABLET | Freq: Every day | ORAL | Status: DC
  Administered 2017-06-02 – 2017-06-06 (×5): 1 mg via ORAL
  Filled 2017-06-02 (×5): qty 2

## 2017-06-02 MED ORDER — IPRATROPIUM BROMIDE 0.02 % IN SOLN
RESPIRATORY_TRACT | Status: AC
Start: 2017-06-02 — End: 2017-06-02
  Administered 2017-06-02: 0.5 mg via RESPIRATORY_TRACT
  Filled 2017-06-02: qty 2.5

## 2017-06-02 MED ORDER — GUAIFENESIN ER 600 MG PO TB12: 600.0000 mg | ORAL_TABLET | Freq: Two times a day (BID) | ORAL | Status: DC

## 2017-06-02 MED ORDER — PANTOPRAZOLE SODIUM 40 MG PO TBEC
40.0000 mg | DELAYED_RELEASE_TABLET | Freq: Two times a day (BID) | ORAL | Status: DC
  Administered 2017-06-03 – 2017-06-06 (×8): 40 mg via ORAL
  Filled 2017-06-02 (×8): qty 1

## 2017-06-02 MED ORDER — ONDANSETRON HCL 4 MG/2ML IJ SOLN: 4.0000 mg | Freq: Four times a day (QID) | INTRAMUSCULAR | Status: DC | PRN

## 2017-06-02 MED ORDER — ONDANSETRON HCL 4 MG PO TABS: 4.0000 mg | ORAL_TABLET | Freq: Four times a day (QID) | ORAL | Status: DC | PRN

## 2017-06-02 MED ORDER — LORATADINE 10 MG PO TABS
10.0000 mg | ORAL_TABLET | Freq: Every day | ORAL | Status: DC
  Administered 2017-06-03 – 2017-06-07 (×5): 10 mg via ORAL
  Filled 2017-06-02 (×5): qty 1

## 2017-06-02 MED ORDER — FLUTICASONE PROPIONATE 50 MCG/ACT NA SUSP
2.0000 | Freq: Every day | NASAL | Status: DC
  Administered 2017-06-05 – 2017-06-07 (×3): 2 via NASAL
  Filled 2017-06-02: qty 16

## 2017-06-02 MED ORDER — MAGNESIUM SULFATE 2 GM/50ML IV SOLN
2.0000 g | Freq: Once | INTRAVENOUS | Status: AC
  Administered 2017-06-02: 2 g via INTRAVENOUS
  Filled 2017-06-02: qty 50

## 2017-06-02 MED ORDER — ONDANSETRON HCL 4 MG PO TABS: 4.0000 mg | ORAL_TABLET | Freq: Three times a day (TID) | ORAL | Status: DC | PRN

## 2017-06-02 NOTE — H&P (Signed)
History and Physical    [name]:[id number] DOB: 1941/02/26 DOA: 06/02/2017  PCP: Glendale Chard, MD Patient coming from: home  Chief Complaint: worsening sob  HPI: [name] is a pleasant 76 y.o. female with medical history significant COPD on home oxygen, CAD, diabetes, hypertension, presents to emergency Department chief complaint worsening shortness of breath. Initial evaluation reveals acute on chronic respiratory failure likely related to COPD exacerbation. [facility] asked to admit  Information is obtained from the patient and her daughter who is at the bedside. She reports about 6 weeks ago she got "a cold". She states her shortness of breath and cough sputum production all increased. She required more oxygen than usual. She was using her nebulizer more than usual. Eventually the cold symptoms subsided however she has remained quite short of breath with cough. She states she's been on antibiotics and oral steroids for the entire time. This morning she states she got out of bed went to the bathroom oxygen saturation level "dropped into the 60s". He reports that this has happened occasionally in the past but usually bounces back up. She denies chest pain but does note that her "heart rate was very fast". She denies headache fever chills dizziness syncope or near-syncope. She denies abdominal pain nausea vomiting diarrhea but does endorse a decreased appetite. She denies dysuria hematuria frequency or urgency. Emesis called she is provided with continuous nebulizer 125 mg Solu-Medrol. She was placed on C Pap for transport. She was changed toBiPap in the emergency department. She quickly weaned off of that.    ED Course: In emergency department she's afebrile hemodynamically stable. She is weaned off of BiPAP and at the time of admission oxygen saturation level 94% on 2.5 L nasal cannula. She is nontoxic appearing  Review of Systems: As per HPI otherwise all other  systems reviewed and are negative.   Ambulatory Status: She lives at home with her husband and her daughter. She owns a cane in the walker due to an unsteady gait and frequent falls but is noncompliant  Past Medical History:  Diagnosis Date  . CAD (coronary artery disease)    Stent RCA 2005, stent LAD 2006  . Carotid artery occlusion   . COPD (chronic obstructive pulmonary disease) ([place])   . Depression   . Diabetes mellitus without complication ([place])   . Fatigue   . Heart murmur   . Hyperlipemia   . Hypertension   . Myocardial infarction ([place])   . Recurrent upper respiratory infection (URI)   . Shortness of breath   . Tobacco abuse     Past Surgical History:  Procedure Laterality Date  . CARDIAC CATHETERIZATION  2011  . coronary angiography  Nov 25, 2004   CYPHER stenting, left anterior descending  . CORONARY STENT PLACEMENT     drug-eluting; right coronary artery  . VESICOVAGINAL FISTULA CLOSURE W/ TAH      Social History   Social History  . Marital status: Married    Spouse name: N/A  . Number of children: 3  . Years of education: N/A   Occupational History  . Retired Administrator   Social History Main Topics  . Smoking status: Former Smoker    Packs/day: 0.25    Years: 50.00    Types: Cigarettes    Quit date: 02/15/2012  . Smokeless tobacco: Never Used     Comment: started smoking at age 55/// Pt is currently trying to quit smoking, using the  electronic cigs, not longer smoking tobacco  . Alcohol use Yes     Comment: occasional wine  . Drug use: No  . Sexual activity: Not Currently    Birth control/ protection: Post-menopausal   Other Topics Concern  . Not on file   Social History Narrative   Husband smokes cigars    Allergies  Allergen Reactions  . Bactrim [Sulfamethoxazole-Trimethoprim] Nausea And Vomiting  . Codeine Nausea And Vomiting  . Vytorin [Ezetimibe-Simvastatin] Other (See Comments)    Malaise    Family History    Problem Relation Age of Onset  . Diabetes Mother        deceased  . Heart disease Mother   . Alzheimer's disease Father        deceased  . Diabetes Sister   . Heart attack Sister     Prior to Admission medications   Medication Sig Start Date End Date Taking? Authorizing Provider  albuterol (PROAIR HFA) 108 (90 Base) MCG/ACT inhaler Inhale 2 puffs into the lungs every 4 (four) hours as needed for wheezing or shortness of breath. 05/29/17  Yes Young, Tarri Fuller D, MD  amitriptyline (ELAVIL) 10 MG tablet 1 daily at bedtime 09/06/16  Yes Young, Tarri Fuller D, MD  amoxicillin (AMOXIL) 500 MG tablet Take 1 tablet (500 mg total) by mouth 2 (two) times daily. Patient taking differently: Take 500 mg by mouth 2 (two) times daily. FOR 7 DAYS 05/31/17  Yes Baird Lyons D, MD  Ascorbic Acid (VITAMIN C) 1000 MG tablet Take 1,000 mg by mouth daily.   Yes [provider]  aspirin EC 81 MG tablet Take 81 mg by mouth at bedtime.    Yes [provider]  Biotin (BIOTIN MAXIMUM STRENGTH) 10 MG TABS Take 10 mg by mouth daily.    Yes [provider]  Cholecalciferol (VITAMIN D) 2000 units tablet Take 2,000 Units by mouth daily.   Yes [provider]  clonazePAM (KLONOPIN) 1 MG tablet Take 1 tablet (1 mg total) by mouth at bedtime. 11/30/16  Yes Robbie Lis, MD  famotidine (PEPCID) 20 MG tablet Take 1 tablet (20 mg total) by mouth daily. 12/01/16  Yes Robbie Lis, MD  fluticasone [facility]) 50 MCG/ACT nasal spray Place 2 sprays into both nostrils daily. Patient taking differently: Place 2 sprays into both nostrils daily as needed for allergies or rhinitis.  12/01/16  Yes Robbie Lis, MD  Fluticasone-Salmeterol (ADVAIR DISKUS) 250-50 MCG/DOSE AEPB Inhale 1 puff into the lungs 2 (two) times daily. 12/15/16  Yes Magdalen Spatz, NP  gabapentin (NEURONTIN) 100 MG capsule Take 1 capsule (100 mg total) by mouth 3 (three) times daily as needed (pain). Patient taking differently: Take 100 mg  by mouth at bedtime as needed (for nerve pain).  11/30/16  Yes Robbie Lis, MD  guaiFENesin (MUCINEX) 600 MG 12 hr tablet Take 1 tablet (600 mg total) by mouth 2 (two) times daily. 10/19/15  Yes Erick Colace, NP  ipratropium (ATROVENT) 0.02 % nebulizer solution Take 2.5 mLs (0.5 mg total) by nebulization 4 (four) times daily. 03/02/17  Yes Young, Tarri Fuller D, MD  loratadine (CLARITIN) 10 MG tablet Take 1 tablet (10 mg total) by mouth daily. Patient taking differently: Take 10 mg by mouth daily as needed for allergies.  12/01/16  Yes Robbie Lis, MD  Melatonin 5 MG TABS Take 5 mg by mouth at bedtime as needed (for sleep).    Yes [provider]  metFORMIN (GLUCOPHAGE-XR) 500 MG  24 hr tablet Take 500-1,000 mg by mouth See admin instructions. 500 mg in the morning and 1,000 mg in the evening 01/26/17  Yes [provider]  Multiple Vitamin (MULTIVITAMIN WITH MINERALS) TABS tablet Take 1 tablet by mouth daily.   Yes [provider]  naproxen sodium (ALEVE) 220 MG tablet Take 220-440 mg by mouth 2 (two) times daily as needed (for pain).   Yes [provider]  nitroGLYCERIN (NITROSTAT) 0.4 MG SL tablet Place 1 tablet (0.4 mg total) under the tongue every 5 (five) minutes as needed for chest pain. 04/09/15  Yes Burnell Blanks, MD  ondansetron (ZOFRAN) 4 MG tablet Take 1 tablet (4 mg total) by mouth every 8 (eight) hours as needed for nausea or vomiting. 06/01/17  Yes Baird Lyons D, MD  OXYGEN Inhale into the lungs continuous. 2 1/2 - 3 L   Yes [provider]  predniSONE (DELTASONE) 10 MG tablet Take 2 tablets daily with food for 5 days. Patient taking differently: Take 20 mg by mouth daily. FOR 5 DAYS 05/31/17  Yes Young, Tarri Fuller D, MD  senna-docusate (SENOKOT-S) 8.6-50 MG tablet Take 1 tablet by mouth at bedtime as needed for mild constipation. 11/30/16  Yes Robbie Lis, MD  [facility] HANDIHALER 18 MCG inhalation capsule INHALE 1 CAPSULE DAILY 07/27/16   Yes Baird Lyons D, MD  albuterol (PROVENTIL) (2.5 MG/3ML) 0.083% nebulizer solution USE 1 VIAL IN NEBULIZER EVERY 6 HOURS FOR WHEEZING 05/29/17   Baird Lyons D, MD    Physical Exam: Vitals:   06/02/17 1415 06/02/17 1430 06/02/17 1445 06/02/17 1500  BP: 121/67 (!) 115/58 133/60 130/62  Pulse: (!) 110 (!) 110 (!) 108 (!) 109  Resp: 19 (!) 23 19 (!) 26  Temp:      TempSrc:      SpO2: 94% 94% 93% 96%     General:  Appears Only slightly anxious, thin and frail but not uncomfortable Eyes:  PERRL, EOMI, normal lids, iris ENT:  grossly normal hearing, lips & tongue, mucous membranes of her mouth moist and pink Neck:  no LAD, masses or thyromegaly Cardiovascular:  Tachycardic but regular heart sounds slightly distant no m/r/g. No LE edema.  Respiratory:  Only mild increased work of breathing with conversation. Breath sounds are quite diminished throughout very limited air movement. No wheeze Abdomen:  soft, ntnd, +BS throughout no guarding or rebounding Skin:  no rash or induration seen on limited exam Musculoskeletal:  grossly normal tone BUE/BLE, good ROM, no bony abnormality Psychiatric:  grossly normal mood and affect, speech fluent and appropriate, AOx3 Neurologic:  CN 2-12 grossly intact, moves all extremities in coordinated fashion, sensation intact speech clear facial symmetry moving all extremities spontaneously  Labs on Admission: I have personally reviewed following labs and imaging studies  CBC:  Recent Labs Lab 06/02/17 1203  WBC 12.0*  NEUTROABS 8.4*  HGB 12.3  HCT 40.3  MCV 103.9*  PLT 431   Basic Metabolic Panel:  Recent Labs Lab 06/02/17 1230  NA 142  K 4.6  CL 95*  CO2 36*  GLUCOSE 237*  BUN 16  CREATININE 0.66  CALCIUM 9.0   GFR: CrCl cannot be calculated (Unknown ideal weight.). Liver Function Tests:  Recent Labs Lab 06/02/17 1230  AST 17  ALT 15  ALKPHOS 64  BILITOT 0.7  PROT 6.5  ALBUMIN 3.3*   No results for input(s): LIPASE,  AMYLASE in the last 168 hours. No results for input(s): AMMONIA in the last 168 hours.  Coagulation Profile: No results for input(s): INR, PROTIME in the last 168 hours. Cardiac Enzymes: No results for input(s): CKTOTAL, CKMB, CKMBINDEX, TROPONINI in the last 168 hours. BNP (last 3 results) No results for input(s): PROBNP in the last 8760 hours. HbA1C:  Recent Labs  06/02/17 1458  HGBA1C 7.4*   CBG: No results for input(s): GLUCAP in the last 168 hours. Lipid Profile: No results for input(s): CHOL, HDL, LDLCALC, TRIG, CHOLHDL, LDLDIRECT in the last 72 hours. Thyroid Function Tests: No results for input(s): TSH, T4TOTAL, FREET4, T3FREE, THYROIDAB in the last 72 hours. Anemia Panel: No results for input(s): VITAMINB12, FOLATE, FERRITIN, TIBC, IRON, RETICCTPCT in the last 72 hours. Urine analysis:    Component Value Date/Time   COLORURINE YELLOW 10/12/2015 1018   APPEARANCEUR CLOUDY (A) 10/12/2015 1018   LABSPEC 1.033 (H) 10/12/2015 1018   PHURINE 5.5 10/12/2015 1018   GLUCOSEU >1000 (A) 10/12/2015 1018   HGBUR NEGATIVE 10/12/2015 1018   BILIRUBINUR NEGATIVE 10/12/2015 1018   KETONESUR 40 (A) 10/12/2015 1018   PROTEINUR NEGATIVE 10/12/2015 1018   UROBILINOGEN 0.2 12/26/2010 0926   NITRITE NEGATIVE 10/12/2015 1018   LEUKOCYTESUR NEGATIVE 10/12/2015 1018    Creatinine Clearance: CrCl cannot be calculated (Unknown ideal weight.).  Sepsis Labs: @LABRCNTIP (procalcitonin:4,lacticidven:4) )No results found for this or any previous visit (from the past 240 hour(s)).   Radiological Exams on Admission: Dg Chest Portable 1 View  Result Date: 06/02/2017 CLINICAL DATA:  Shortness of breath. EXAM: PORTABLE CHEST 1 VIEW COMPARISON:  Chest x-ray dated November 26, 2016. FINDINGS: The cardiomediastinal silhouette is normal in size. Normal pulmonary vascularity. Hyperinflated lungs with unchanged emphysematous changes in the bilateral upper lobes. Coarsened interstitial markings,  particularly within the lower lobes, appear slightly more prominent when compared to prior study. No focal consolidation. No pleural effusion or pneumothorax. No acute osseous abnormality. IMPRESSION: COPD.  No active cardiopulmonary disease. Electronically Signed   By: Titus Dubin M.D.   On: 06/02/2017 12:29    EKG: Independently reviewed. Sinus tachycardia Multiple premature complexes, vent & supraven Borderline right axis deviation Borderline ST depression, diffuse leads  Assessment/Plan Principal Problem:   Acute on chronic respiratory failure (HCC) Active Problems:   Tobacco use disorder, severe, in sustained remission   Essential hypertension   Coronary atherosclerosis   COPD with acute exacerbation (HCC)   DM2 (diabetes mellitus, type 2) ([place])   Depression   #1. Acute on chronic respiratory failure secondary to acute COPD exacerbation. Patient treated for recent upper respiratory infection with no improvement. At admission patient off BiPAP with only mild increased work of breathing with conversation. Oxygen saturation level 94% on 2.5 L respiratory rate 24 afebrile hemodynamically stable. Chest x-ray negative for infiltrate. Of note patient has not been hospitalized since February of this year. Prior to that hospitalization she had multiple admissions -Admit to telemetry -Scheduled nebulizers -Solu-Medrol -We'll continue home antibiotics -Continue home meds -Palliative care consult  #2. Hypertension. Controlled in the emergency department. Home medications include antihypertensives. -Monitor  #3. Diabetes type 2. Serum glucose 237. Home medications include oral agents. Likely related to ongoing steroid use. -Obtain a hemoglobin A1c -Sliding scale for optimal control  #4. COPD with acute exacerbation. Chart review indicates recent office visit with pulmonology in May of this year. Office notes indicate the end stage lung disease. -See #1 -Continue home meds -Patient  indicates she wishes to be a DO NOT RESUSCITATE -Requested a palliative care consult - #5. Depression. Appears stable at baseline. -Continue home meds  DVT prophylaxis: scd  Code Status: dnr  Family Communication: daughter at bedside  Disposition Plan: home  Consults called: palliative care  Admission status: inpatient    Radene Gunning MD [facility]  If 7PM-7AM, please contact night-coverage [email or web address] Password [facility]  06/02/2017, 3:55 PM

## 2017-06-02 NOTE — Progress Notes (Signed)
Pt. Arrived via EMS and placed on this facilities bipap 12/6.35%. Continuous neb started as ordered.

## 2017-06-02 NOTE — ED Triage Notes (Signed)
Per EMS- pt called out for increased respiratory distress starting at 0945 today. Pt has been on antibiotics and steroids recently for a possible URI. Upon EMS arrival to was diminished bilaterally. Pt received albuterol 7.5/atrovent 1mg  and 125 mg of solumedrol. Placed on CPAP for transport. Pt changed to BiPap upon arrival

## 2017-06-02 NOTE — ED Provider Notes (Signed)
[facility] DEPT Provider Note   CSN: [id number] Arrival date & time: 06/02/17  1152     History   Chief Complaint Chief Complaint  Patient presents with  . Respiratory Distress    HPI [name] is a 76 y.o. female.  HPI  76yo female with history of COPD on up to 2.5L during the day, CAD, hypertension, hyperlipidemia, DM presents with concern for dyspnea and cough.  Reports has been occurring for a little over 1 month, waxing and waning and has been off and on antibiotics and on prednisone taper. Most recently on 5th round of medication, placed on amoxicillin BID and prednisone 20mg  daily for 5 days. Reports this morning, dyspnea worsened. Saturations in 1s per son at home, improved with increasing O2, on EMS arrival had saturations in low 80s. Placed on CPAP and given albuterol, atrovent, solumedrol.  Reports cough, wheezing, dyspnea worsening. No chest pain. No leg pain or swelling. Reports right upper back pain worse with movements. No hx of DVT. No fever. Reports some congestion.  Past Medical History:  Diagnosis Date  . CAD (coronary artery disease)    Stent RCA 2005, stent LAD 2006  . Carotid artery occlusion   . COPD (chronic obstructive pulmonary disease) ([place])   . Depression   . Diabetes mellitus without complication ([place])   . Fatigue   . Heart murmur   . Hyperlipemia   . Hypertension   . Myocardial infarction ([place])   . Recurrent upper respiratory infection (URI)   . Shortness of breath   . Tobacco abuse     Patient Active Problem List   Diagnosis Date Noted  . Goals of care, counseling/discussion   . Palliative care encounter   . Acute diastolic CHF (congestive heart failure) ([place])   . Acute bronchitis   . Leukocytosis   . Depression   . Acute respiratory failure with hypoxia and hypercapnia ([place]) 11/24/2016  . DM2 (diabetes mellitus, type 2) ([place]) 11/24/2016  . Post herpetic neuralgia 09/06/2016  . Rhinitis, chronic 03/25/2016  . Counseling  regarding end of life decision making 03/25/2016  . Acute respiratory failure with hypoxemia ([place])   . COPD with acute exacerbation ([place])   . Respiratory failure with hypoxia and hypercapnia ([place]) 10/12/2015  . Acute on chronic respiratory failure ([place]) 10/12/2015  . Shoulder pain, left 06/05/2015  . Right-sided chest wall pain 12/21/2014  . Respiratory acidosis 02/16/2012  . Palpitations 12/21/2011  . CHEST PAIN, PRECORDIAL 07/06/2010  . CAROTID ARTERY DISEASE 09/22/2009  . FATIGUE 04/10/2009  . HYPERLIPIDEMIA 04/09/2009  . Tobacco use disorder, severe, in sustained remission 04/09/2009  . Essential hypertension 04/09/2009  . Coronary atherosclerosis 04/09/2009  . COPD (chronic obstructive pulmonary disease) with chronic bronchitis ([place]) 04/09/2009    Past Surgical History:  Procedure Laterality Date  . CARDIAC CATHETERIZATION  2011  . coronary angiography  Nov 25, 2004   CYPHER stenting, left anterior descending  . CORONARY STENT PLACEMENT     drug-eluting; right coronary artery  . VESICOVAGINAL FISTULA CLOSURE W/ TAH      OB History    No data available       Home Medications    Prior to Admission medications   Medication Sig Start Date End Date Taking? Authorizing Provider  albuterol (PROAIR HFA) 108 (90 Base) MCG/ACT inhaler Inhale 2 puffs into the lungs every 4 (four) hours as needed for wheezing or shortness of breath. 05/29/17  Yes Baird Lyons D, MD  albuterol (PROVENTIL) (2.5 MG/3ML) 0.083%  nebulizer solution USE 1 VIAL IN NEBULIZER EVERY 6 HOURS FOR WHEEZING 05/29/17  Yes Young, Clinton D, MD  amitriptyline (ELAVIL) 10 MG tablet 1 daily at bedtime Patient taking differently: Take 10 mg by mouth at bedtime.  09/06/16  Yes Young, Tarri Fuller D, MD  amoxicillin (AMOXIL) 500 MG tablet Take 1 tablet (500 mg total) by mouth 2 (two) times daily. Patient taking differently: Take 500 mg by mouth 2 (two) times daily. FOR 7 DAYS 05/31/17  Yes Baird Lyons D, MD  Ascorbic Acid  (VITAMIN C) 1000 MG tablet Take 1,000 mg by mouth daily.   Yes [provider]  aspirin EC 81 MG tablet Take 81 mg by mouth at bedtime.    Yes [provider]  Biotin (BIOTIN MAXIMUM STRENGTH) 10 MG TABS Take 10 mg by mouth daily.    Yes [provider]  Cholecalciferol (VITAMIN D) 2000 units tablet Take 2,000 Units by mouth daily.   Yes [provider]  clonazePAM (KLONOPIN) 1 MG tablet Take 1 tablet (1 mg total) by mouth at bedtime. 11/30/16  Yes Robbie Lis, MD  famotidine (PEPCID) 20 MG tablet Take 1 tablet (20 mg total) by mouth daily. 12/01/16  Yes Robbie Lis, MD  fluticasone [facility]) 50 MCG/ACT nasal spray Place 2 sprays into both nostrils daily. Patient taking differently: Place 2 sprays into both nostrils daily as needed for allergies or rhinitis.  12/01/16  Yes Robbie Lis, MD  Fluticasone-Salmeterol (ADVAIR DISKUS) 250-50 MCG/DOSE AEPB Inhale 1 puff into the lungs 2 (two) times daily. 12/15/16  Yes Magdalen Spatz, NP  gabapentin (NEURONTIN) 100 MG capsule Take 1 capsule (100 mg total) by mouth 3 (three) times daily as needed (pain). Patient taking differently: Take 100 mg by mouth at bedtime as needed (for nerve pain).  11/30/16  Yes Robbie Lis, MD  guaiFENesin (MUCINEX) 600 MG 12 hr tablet Take 1 tablet (600 mg total) by mouth 2 (two) times daily. 10/19/15  Yes Erick Colace, NP  ipratropium (ATROVENT) 0.02 % nebulizer solution Take 2.5 mLs (0.5 mg total) by nebulization 4 (four) times daily. 03/02/17  Yes Young, Tarri Fuller D, MD  loratadine (CLARITIN) 10 MG tablet Take 1 tablet (10 mg total) by mouth daily. Patient taking differently: Take 10 mg by mouth daily as needed for allergies.  12/01/16  Yes Robbie Lis, MD  Melatonin 5 MG TABS Take 5 mg by mouth at bedtime as needed (for sleep).    Yes [provider]  metFORMIN (GLUCOPHAGE-XR) 500 MG 24 hr tablet Take 500-1,000 mg by mouth See admin instructions. 500 mg in the morning and  1,000 mg in the evening 01/26/17  Yes [provider]  Multiple Vitamin (MULTIVITAMIN WITH MINERALS) TABS tablet Take 1 tablet by mouth daily.   Yes [provider]  naproxen sodium (ALEVE) 220 MG tablet Take 220-440 mg by mouth 2 (two) times daily as needed (for pain).   Yes [provider]  nitroGLYCERIN (NITROSTAT) 0.4 MG SL tablet Place 1 tablet (0.4 mg total) under the tongue every 5 (five) minutes as needed for chest pain. 04/09/15  Yes Burnell Blanks, MD  ondansetron (ZOFRAN) 4 MG tablet Take 1 tablet (4 mg total) by mouth every 8 (eight) hours as needed for nausea or vomiting. 06/01/17  Yes Young, Tarri Fuller D, MD  OXYGEN Inhale 2.5-3 L into the lungs continuous.    Yes [provider]  predniSONE (DELTASONE) 10 MG tablet Take 2 tablets daily  with food for 5 days. Patient taking differently: Take 20 mg by mouth daily. FOR 5 DAYS 05/31/17  Yes Young, Tarri Fuller D, MD  senna-docusate (SENOKOT-S) 8.6-50 MG tablet Take 1 tablet by mouth at bedtime as needed for mild constipation. 11/30/16  Yes Robbie Lis, MD  SPIRIVA HANDIHALER 18 MCG inhalation capsule INHALE 1 CAPSULE DAILY Patient taking differently: Inhale the contents of one capsule once a day 07/27/16  Yes Young, Kasandra Knudsen, MD    Family History Family History  Problem Relation Age of Onset  . Diabetes Mother        deceased  . Heart disease Mother   . Alzheimer's disease Father        deceased  . Diabetes Sister   . Heart attack Sister     Social History Social History  Substance Use Topics  . Smoking status: Former Smoker    Packs/day: 0.25    Years: 50.00    Types: Cigarettes    Quit date: 02/15/2012  . Smokeless tobacco: Never Used     Comment: started smoking at age 22/// Pt is currently trying to quit smoking, using the electronic cigs, not longer smoking tobacco  . Alcohol use Yes     Comment: occasional wine     Allergies   Bactrim [sulfamethoxazole-trimethoprim]; Codeine;  and Vytorin [ezetimibe-simvastatin]   Review of Systems Review of Systems  Constitutional: Negative for fever.  HENT: Negative for sore throat.   Eyes: Negative for visual disturbance.  Respiratory: Positive for cough, shortness of breath and wheezing.   Cardiovascular: Negative for chest pain.  Gastrointestinal: Positive for nausea (starting yesterday). Negative for abdominal pain and vomiting.  Genitourinary: Negative for difficulty urinating.  Musculoskeletal: Positive for back pain. Negative for neck pain.  Skin: Negative for rash.  Neurological: Negative for syncope and headaches.     Physical Exam Updated Vital Signs BP 136/65 (BP Location: Right Arm)   Pulse (!) 104   Temp (!) 97.2 F (36.2 C) (Oral)   Resp 18   Wt 51.3 kg (113 lb 1.6 oz)   SpO2 93%   BMI 21.37 kg/m   Physical Exam  Constitutional: She is oriented to person, place, and time. She appears well-developed and well-nourished. No distress.  HENT:  Head: Normocephalic and atraumatic.  Eyes: Conjunctivae and EOM are normal.  Neck: Normal range of motion.  Cardiovascular: Normal rate, regular rhythm, normal heart sounds and intact distal pulses.  Exam reveals no gallop and no friction rub.   No murmur heard. Pulmonary/Chest: Accessory muscle usage present. Tachypnea noted. No respiratory distress. She has decreased breath sounds. She has wheezes. She has no rales.  Abdominal: Soft. She exhibits no distension. There is no tenderness. There is no guarding.  Musculoskeletal: She exhibits no edema or tenderness.  Neurological: She is alert and oriented to person, place, and time.  Skin: Skin is warm and dry. No rash noted. She is not diaphoretic. No erythema.  Nursing note and vitals reviewed.    ED Treatments / Results  Labs (all labs ordered are listed, but only abnormal results are displayed) Labs Reviewed  CBC WITH DIFFERENTIAL/PLATELET - Abnormal; Notable for the following:       Result Value   WBC  12.0 (*)    MCV 103.9 (*)    Neutro Abs 8.4 (*)    Monocytes Absolute 1.5 (*)    All other components within normal limits  COMPREHENSIVE METABOLIC PANEL - Abnormal; Notable for the following:    Chloride  95 (*)    CO2 36 (*)    Glucose, Bld 237 (*)    Albumin 3.3 (*)    All other components within normal limits  HEMOGLOBIN A1C - Abnormal; Notable for the following:    Hgb A1c MFr Bld 7.4 (*)    All other components within normal limits  BRAIN NATRIURETIC PEPTIDE  TROPONIN I  BASIC METABOLIC PANEL  CBC  I-STAT TROPONIN, ED    EKG  EKG Interpretation None       Radiology Dg Chest Portable 1 View  Result Date: 06/02/2017 CLINICAL DATA:  Shortness of breath. EXAM: PORTABLE CHEST 1 VIEW COMPARISON:  Chest x-ray dated November 26, 2016. FINDINGS: The cardiomediastinal silhouette is normal in size. Normal pulmonary vascularity. Hyperinflated lungs with unchanged emphysematous changes in the bilateral upper lobes. Coarsened interstitial markings, particularly within the lower lobes, appear slightly more prominent when compared to prior study. No focal consolidation. No pleural effusion or pneumothorax. No acute osseous abnormality. IMPRESSION: COPD.  No active cardiopulmonary disease. Electronically Signed   By: Titus Dubin M.D.   On: 06/02/2017 12:29    Procedures Procedures (including critical care time)  Medications Ordered in ED Medications  ondansetron (ZOFRAN) tablet 4 mg (not administered)  mometasone-formoterol (DULERA) 200-5 MCG/ACT inhaler 2 puff (2 puffs Inhalation Given 06/02/17 2125)  clonazePAM (KLONOPIN) tablet 1 mg (not administered)  famotidine (PEPCID) tablet 20 mg (not administered)  fluticasone (FLONASE) 50 MCG/ACT nasal spray 2 spray (not administered)  gabapentin (NEURONTIN) capsule 100 mg (not administered)  loratadine (CLARITIN) tablet 10 mg (not administered)  pantoprazole (PROTONIX) EC tablet 40 mg (not administered)  senna-docusate (Senokot-S)  tablet 1 tablet (not administered)  amitriptyline (ELAVIL) tablet 10 mg (not administered)  tiotropium (SPIRIVA) inhalation capsule 18 mcg (18 mcg Inhalation Given 06/02/17 2125)  cholecalciferol (VITAMIN D) tablet 2,000 Units (not administered)  guaiFENesin (MUCINEX) 12 hr tablet 600 mg (not administered)  nitroGLYCERIN (NITROSTAT) SL tablet 0.4 mg (not administered)  aspirin EC tablet 81 mg (not administered)  furosemide (LASIX) tablet 40 mg (not administered)  enoxaparin (LOVENOX) injection 40 mg (not administered)  0.9 %  sodium chloride infusion ( Intravenous New Bag/Given 06/02/17 1837)  acetaminophen (TYLENOL) tablet 650 mg (not administered)    Or  acetaminophen (TYLENOL) suppository 650 mg (not administered)  ondansetron (ZOFRAN) tablet 4 mg (not administered)    Or  ondansetron (ZOFRAN) injection 4 mg (not administered)  albuterol (PROVENTIL) (2.5 MG/3ML) 0.083% nebulizer solution 2.5 mg (2.5 mg Nebulization Given 06/02/17 2122)  albuterol (PROVENTIL) (2.5 MG/3ML) 0.083% nebulizer solution 2.5 mg (not administered)  ipratropium (ATROVENT) nebulizer solution 0.5 mg (0.5 mg Nebulization Given 06/02/17 2123)  insulin aspart (novoLOG) injection 0-9 Units (not administered)  insulin aspart (novoLOG) injection 0-5 Units (not administered)  methylPREDNISolone sodium succinate (SOLU-MEDROL) 125 mg/2 mL injection 80 mg (not administered)  ipratropium (ATROVENT) 0.02 % nebulizer solution (0.5 mg Inhalation Given 06/02/17 1215)  albuterol (PROVENTIL) (2.5 MG/3ML) 0.083% nebulizer solution (10 mg  Given 06/02/17 1216)  magnesium sulfate IVPB 2 g 50 mL (0 g Intravenous Stopped 06/02/17 1312)   CRITICAL CARE: COPD on bipap Performed by: Alvino Chapel   Total critical care time: 30 minutes  Critical care time was exclusive of separately billable procedures and treating other patients.  Critical care was necessary to treat or prevent imminent or life-threatening  deterioration.  Critical care was time spent personally by me on the following activities: development of treatment plan with patient and/or surrogate as well as nursing, discussions  with consultants, evaluation of patient's response to treatment, examination of patient, obtaining history from patient or surrogate, ordering and performing treatments and interventions, ordering and review of laboratory studies, ordering and review of radiographic studies, pulse oximetry and re-evaluation of patient's condition.   Initial Impression / Assessment and Plan / ED Course  I have reviewed the triage vital signs and the nursing notes.  Pertinent labs & imaging results that were available during my care of the patient were reviewed by me and considered in my medical decision making (see chart for details).     76yo female with history of COPD on up to 2.5L during the day, CAD, hypertension, hyperlipidemia, DM presents with concern for dyspnea and cough. Patient with hypoxia on EMS arrival, placed on CPAP and given nebs and solumedrol by EMS.  Patient with continuing tachypnea, decreased breath sounds and wheezing on arrival.  Placed on BiPAP, continued albuterol/atrovent and given Mg.  XR shows no pneumonia or CHF. Neg troponin.  History, physical most consistent with COPD exacerbation.  Patient improved with nebs and bipap and placed on nasal cannula. Continuing tachycardia. Will admit for continued evaluation, monitoring and treatment.  Final Clinical Impressions(s) / ED Diagnoses   Final diagnoses:  COPD exacerbation [facility])    New Prescriptions Current Discharge Medication List       Gareth Morgan, MD 06/02/17 2212

## 2017-06-02 NOTE — Telephone Encounter (Signed)
Called and spoke with pt's spouse, [name]. [name] states pt is experiencing increased sob this morning. [name] states he has increased pt's liter flow to 3L O2 reading 85% HR 125 at rest. [name] states pt's O2 is dropping in the 60's with exertion on 3L. During our [employer] monitored pt's O2, as pt practiced pursed lip breathing pt's O2 increased to 88-89% HR 125. I have spoken with CY verbally who recommend pt go to ED for further evaluation giving her symptoms. I have commended that [employer] call EMS. Nothing further needed.

## 2017-06-02 NOTE — ED Notes (Signed)
ED Provider at bedside. 

## 2017-06-02 NOTE — ED Notes (Addendum)
Admitting Provider at bedside. 

## 2017-06-02 NOTE — Progress Notes (Signed)
Patient admitted from ED at 1830H, SOB noted but denies any pain. Put on telemetry monitoring and notified [place].

## 2017-06-02 NOTE — ED Notes (Addendum)
Pt off bipap. Tolerating Nasal cannula 2.5L well. Speaking in full sentences

## 2017-06-03 ENCOUNTER — Inpatient Hospital Stay (HOSPITAL_COMMUNITY): Payer: Medicare Other

## 2017-06-03 DIAGNOSIS — J9621 Acute and chronic respiratory failure with hypoxia: Principal | ICD-10-CM

## 2017-06-03 DIAGNOSIS — J9622 Acute and chronic respiratory failure with hypercapnia: Secondary | ICD-10-CM

## 2017-06-03 DIAGNOSIS — E119 Type 2 diabetes mellitus without complications: Secondary | ICD-10-CM

## 2017-06-03 DIAGNOSIS — Z515 Encounter for palliative care: Secondary | ICD-10-CM

## 2017-06-03 DIAGNOSIS — J441 Chronic obstructive pulmonary disease with (acute) exacerbation: Secondary | ICD-10-CM

## 2017-06-03 DIAGNOSIS — E440 Moderate protein-calorie malnutrition: Secondary | ICD-10-CM | POA: Insufficient documentation

## 2017-06-03 LAB — CBC
HEMATOCRIT: 37.5 % (ref 36.0–46.0)
Hemoglobin: 11.6 g/dL — ABNORMAL LOW (ref 12.0–15.0)
MCH: 31.6 pg (ref 26.0–34.0)
MCHC: 30.9 g/dL (ref 30.0–36.0)
MCV: 102.2 fL — ABNORMAL HIGH (ref 78.0–100.0)
Platelets: 258 10*3/uL (ref 150–400)
RBC: 3.67 MIL/uL — ABNORMAL LOW (ref 3.87–5.11)
RDW: 13.7 % (ref 11.5–15.5)
WBC: 7.9 10*3/uL (ref 4.0–10.5)

## 2017-06-03 LAB — BASIC METABOLIC PANEL
Anion gap: 9 (ref 5–15)
BUN: 16 mg/dL (ref 6–20)
CALCIUM: 9.3 mg/dL (ref 8.9–10.3)
CO2: 38 mmol/L — ABNORMAL HIGH (ref 22–32)
Chloride: 94 mmol/L — ABNORMAL LOW (ref 101–111)
Creatinine, Ser: 0.65 mg/dL (ref 0.44–1.00)
GFR calc Af Amer: >60 mL/min (ref >60)
GLUCOSE: 263 mg/dL — AB (ref 65–99)
POTASSIUM: 5 mmol/L (ref 3.5–5.1)
Sodium: 141 mmol/L (ref 135–145)

## 2017-06-03 LAB — GLUCOSE, CAPILLARY
Glucose-Capillary: 278 mg/dL — ABNORMAL HIGH (ref 65–99)
Glucose-Capillary: 247 mg/dL — ABNORMAL HIGH (ref 65–99)
Glucose-Capillary: 294 mg/dL — ABNORMAL HIGH (ref 65–99)

## 2017-06-03 MED ORDER — GLUCERNA SHAKE PO LIQD
237.0000 mL | Freq: Two times a day (BID) | ORAL | Status: DC
  Administered 2017-06-05 – 2017-06-06 (×3): 237 mL via ORAL

## 2017-06-03 MED ORDER — METHOCARBAMOL 1000 MG/10ML IJ SOLN
500.0000 mg | Freq: Three times a day (TID) | INTRAVENOUS | Status: DC
  Administered 2017-06-03 – 2017-06-05 (×6): 500 mg via INTRAVENOUS
  Filled 2017-06-03 (×7): qty 5

## 2017-06-03 MED ORDER — IPRATROPIUM-ALBUTEROL 0.5-2.5 (3) MG/3ML IN SOLN
3.0000 mL | Freq: Three times a day (TID) | RESPIRATORY_TRACT | Status: DC
  Administered 2017-06-03 – 2017-06-06 (×9): 3 mL via RESPIRATORY_TRACT
  Filled 2017-06-03 (×10): qty 3

## 2017-06-03 MED ORDER — INSULIN ASPART 100 UNIT/ML ~~LOC~~ SOLN
3.0000 [IU] | Freq: Three times a day (TID) | SUBCUTANEOUS | Status: DC
  Administered 2017-06-03 – 2017-06-04 (×2): 3 [IU] via SUBCUTANEOUS

## 2017-06-03 MED ORDER — TIOTROPIUM BROMIDE MONOHYDRATE 18 MCG IN CAPS
18.0000 ug | ORAL_CAPSULE | Freq: Every day | RESPIRATORY_TRACT | Status: DC
  Administered 2017-06-03: 18 ug via RESPIRATORY_TRACT
  Filled 2017-06-03: qty 5

## 2017-06-03 MED ORDER — IPRATROPIUM-ALBUTEROL 0.5-2.5 (3) MG/3ML IN SOLN: 3.0000 mL | Freq: Four times a day (QID) | RESPIRATORY_TRACT | Status: DC

## 2017-06-03 NOTE — Consult Note (Signed)
                                                                                 Consultation Note Date: 06/03/2017   Patient Name: [name]  DOB: 05/31/1941  MRN: [id number]  Age / Sex: 76 y.o., female  PCP: Sanders, Robyn, MD Referring Physician: Bhandari, Dron Prasad, MD  Reason for Consultation: Establishing goals of care and Psychosocial/spiritual support  HPI/Patient Profile: 76 y.o. female  with past medical history of Coronary artery disease with history of MI, stenting; diabetes, hypertension, COPD (diagnosed in her 30s) history of intubation in 2017 admitted on 06/02/2017 with shortness of breath. Per chart review, patient has had an upper respiratory and infection, and has been on steroids as well as antibiotics at home without improvement. She reports that her oxygen sats were dropping particularly with any movement and she developed worsening shortness of breath  Patient was last admitted to the hospital in February 2018. At that time she saw  palliative medicine provider during that hospitalization. Patient states that she is never bounced back from her hospitalization in February, 2018. She states she has no energy, no motivation, and her appetite has become very poor. She is followed by pulmonologist, Dr. Young, with [facility] ordered for "end-of-life", goals of care.   Clinical Assessment and Goals of Care: Met with patient this morning for initial assessment. She is alert and oriented 3. She is visibly short of breath at rest, use of accessory muscles as well as pursed lip breathing. As noted, she shares that she has never bounced back since her hospitalization in February 2018. She has been on oxygen 24 hours a day she reports for some time now. She adjusts the liters to usage of more during the day and lower at night at rest. She states she is no longer able to lie flat. She also is reporting that she has no appetite Patient at this point has now elected DO NOT  RESUSCITATE/DO NOT INTUBATE. Patient was intubated in 2017  Patient at this point is able to speak for herself but in the event that she was unable to, her husband, Frankie Cheuvront, would be her healthcare proxy    SUMMARY OF RECOMMENDATIONS   DNR/DNI Follow-up appointment for family meeting on 06/04/2017 at 2 PM Patient likely will qualify for her hospice, in-home, benefit secondary to end-stage COPD. We'll include hospice discussion, support in the home during family meeting on 06/04/2017 Code Status/Advance Care Planning:  DNR    Symptom Management:   Dyspnea: Continue with targeted pulmonary treatments such as oxygen, nebulizer treatments steroids. Patient going for a CT scan of chest later today  Palliative Prophylaxis:   Bowel Regimen, Eye Care, Frequent Pain Assessment, Oral Care and Turn Reposition  Additional Recommendations (Limitations, Scope, Preferences):  Full Scope Treatment except for DO NOT RESUSCITATE/DO NOT INTUBATE  Psycho-social/Spiritual:   Desire for further Chaplaincy support:no  Additional Recommendations: Referral to Community Resources   Prognosis:   < 6 months in the setting of end-stage COPD with comorbidities of coronary artery disease, diastolic heart failure, diabetes, hypertension history of MI   Discharge Planning: To Be Determined        Primary Diagnoses: Present on Admission: . Acute on chronic respiratory failure ([place]) . Coronary atherosclerosis . COPD with acute exacerbation ([place]) . Depression . Essential hypertension . Tobacco use disorder, severe, in sustained remission   I have reviewed the medical record, interviewed the patient and family, and examined the patient. The following aspects are pertinent.  Past Medical History:  Diagnosis Date  . CAD (coronary artery disease)    Stent RCA 2005, stent LAD 2006  . Carotid artery occlusion   . COPD (chronic obstructive pulmonary disease) ([place])   . Depression   . Diabetes  mellitus without complication ([place])   . Fatigue   . Heart murmur   . Hyperlipemia   . Hypertension   . Myocardial infarction ([place])   . Recurrent upper respiratory infection (URI)   . Shortness of breath   . Tobacco abuse    Social History   Social History  . Marital status: Married    Spouse name: N/A  . Number of children: 3  . Years of education: N/A   Occupational History  . Retired Administrator   Social History Main Topics  . Smoking status: Former Smoker    Packs/day: 0.25    Years: 50.00    Types: Cigarettes    Quit date: 02/15/2012  . Smokeless tobacco: Never Used     Comment: started smoking at age 29/// Pt is currently trying to quit smoking, using the electronic cigs, not longer smoking tobacco  . Alcohol use Yes     Comment: occasional wine  . Drug use: No  . Sexual activity: Not Currently    Birth control/ protection: Post-menopausal   Other Topics Concern  . None   Social History Narrative   Husband smokes cigars   Family History  Problem Relation Age of Onset  . Diabetes Mother        deceased  . Heart disease Mother   . Alzheimer's disease Father        deceased  . Diabetes Sister   . Heart attack Sister    Scheduled Meds: . aspirin EC  81 mg Oral QHS  . cholecalciferol  2,000 Units Oral Daily  . clonazePAM  1 mg Oral QHS  . enoxaparin (LOVENOX) injection  40 mg Subcutaneous Q24H  . famotidine  20 mg Oral Daily  . fluticasone  2 spray Each Nare Daily  . furosemide  40 mg Oral Daily  . guaiFENesin  600 mg Oral BID  . insulin aspart  0-5 Units Subcutaneous QHS  . insulin aspart  0-9 Units Subcutaneous TID WC  . ipratropium-albuterol  3 mL Nebulization TID  . loratadine  10 mg Oral Daily  . methylPREDNISolone (SOLU-MEDROL) injection  80 mg Intravenous Q12H  . mometasone-formoterol  2 puff Inhalation BID  . pantoprazole  40 mg Oral BID AC  . tiotropium  18 mcg Inhalation Daily   Continuous Infusions: . sodium chloride 50  mL/hr at 06/02/17 1837   PRN Meds:.acetaminophen **OR** acetaminophen, albuterol, amitriptyline, gabapentin, nitroGLYCERIN, ondansetron **OR** ondansetron (ZOFRAN) IV, ondansetron, senna-docusate Medications Prior to Admission:  Prior to Admission medications   Medication Sig Start Date End Date Taking? Authorizing Provider  albuterol (PROAIR HFA) 108 (90 Base) MCG/ACT inhaler Inhale 2 puffs into the lungs every 4 (four) hours as needed for wheezing or shortness of breath. 05/29/17  Yes Young, Clinton D, MD  albuterol (PROVENTIL) (2.5 MG/3ML) 0.083% nebulizer solution USE 1 VIAL IN NEBULIZER EVERY 6 HOURS FOR WHEEZING 05/29/17  Yes Young, Clinton D, MD  amitriptyline (ELAVIL) 10 MG tablet 1 daily at bedtime Patient taking differently: Take 10 mg by mouth at bedtime.  09/06/16  Yes Young, Clinton D, MD  amoxicillin (AMOXIL) 500 MG tablet Take 1 tablet (500 mg total) by mouth 2 (two) times daily. Patient taking differently: Take 500 mg by mouth 2 (two) times daily. FOR 7 DAYS 05/31/17  Yes Young, Clinton D, MD  Ascorbic Acid (VITAMIN C) 1000 MG tablet Take 1,000 mg by mouth daily.   Yes [provider]  aspirin EC 81 MG tablet Take 81 mg by mouth at bedtime.    Yes [provider]  Biotin (BIOTIN MAXIMUM STRENGTH) 10 MG TABS Take 10 mg by mouth daily.    Yes [provider]  Cholecalciferol (VITAMIN D) 2000 units tablet Take 2,000 Units by mouth daily.   Yes [provider]  clonazePAM (KLONOPIN) 1 MG tablet Take 1 tablet (1 mg total) by mouth at bedtime. 11/30/16  Yes Devine, Alma M, MD  famotidine (PEPCID) 20 MG tablet Take 1 tablet (20 mg total) by mouth daily. 12/01/16  Yes Devine, Alma M, MD  fluticasone (FLONASE) 50 MCG/ACT nasal spray Place 2 sprays into both nostrils daily. Patient taking differently: Place 2 sprays into both nostrils daily as needed for allergies or rhinitis.  12/01/16  Yes Devine, Alma M, MD  Fluticasone-Salmeterol (ADVAIR DISKUS) 250-50  MCG/DOSE AEPB Inhale 1 puff into the lungs 2 (two) times daily. 12/15/16  Yes Groce,  F, NP  gabapentin (NEURONTIN) 100 MG capsule Take 1 capsule (100 mg total) by mouth 3 (three) times daily as needed (pain). Patient taking differently: Take 100 mg by mouth at bedtime as needed (for nerve pain).  11/30/16  Yes Devine, Alma M, MD  guaiFENesin (MUCINEX) 600 MG 12 hr tablet Take 1 tablet (600 mg total) by mouth 2 (two) times daily. 10/19/15  Yes Babcock, Peter E, NP  ipratropium (ATROVENT) 0.02 % nebulizer solution Take 2.5 mLs (0.5 mg total) by nebulization 4 (four) times daily. 03/02/17  Yes Young, Clinton D, MD  loratadine (CLARITIN) 10 MG tablet Take 1 tablet (10 mg total) by mouth daily. Patient taking differently: Take 10 mg by mouth daily as needed for allergies.  12/01/16  Yes Devine, Alma M, MD  Melatonin 5 MG TABS Take 5 mg by mouth at bedtime as needed (for sleep).    Yes [provider]  metFORMIN (GLUCOPHAGE-XR) 500 MG 24 hr tablet Take 500-1,000 mg by mouth See admin instructions. 500 mg in the morning and 1,000 mg in the evening 01/26/17  Yes [provider]  Multiple Vitamin (MULTIVITAMIN WITH MINERALS) TABS tablet Take 1 tablet by mouth daily.   Yes [provider]  naproxen sodium (ALEVE) 220 MG tablet Take 220-440 mg by mouth 2 (two) times daily as needed (for pain).   Yes [provider]  nitroGLYCERIN (NITROSTAT) 0.4 MG SL tablet Place 1 tablet (0.4 mg total) under the tongue every 5 (five) minutes as needed for chest pain. 04/09/15  Yes McAlhany, Christopher D, MD  ondansetron (ZOFRAN) 4 MG tablet Take 1 tablet (4 mg total) by mouth every 8 (eight) hours as needed for nausea or vomiting. 06/01/17  Yes Young, Clinton D, MD  OXYGEN Inhale 2.5-3 L into the lungs continuous.    Yes [provider]  predniSONE (DELTASONE) 10 MG tablet Take 2 tablets daily with food for 5 days. Patient taking differently: Take 20 mg by mouth daily. FOR   5 DAYS  05/31/17  Yes Young, Clinton D, MD  senna-docusate (SENOKOT-S) 8.6-50 MG tablet Take 1 tablet by mouth at bedtime as needed for mild constipation. 11/30/16  Yes Devine, Alma M, MD  SPIRIVA HANDIHALER 18 MCG inhalation capsule INHALE 1 CAPSULE DAILY Patient taking differently: Inhale the contents of one capsule once a day 07/27/16  Yes Young, Clinton D, MD   Allergies  Allergen Reactions  . Bactrim [Sulfamethoxazole-Trimethoprim] Nausea And Vomiting  . Codeine Nausea And Vomiting  . Vytorin [Ezetimibe-Simvastatin] Other (See Comments)    Malaise   Review of Systems  Constitutional: Positive for activity change, appetite change and fatigue.  HENT: Negative.   Eyes: Negative.   Respiratory: Positive for cough and shortness of breath.   Cardiovascular: Positive for palpitations.  Gastrointestinal: Negative.   Endocrine: Negative.   Genitourinary: Negative.   Musculoskeletal: Negative.   Skin: Negative.   Allergic/Immunologic: Negative.   Neurological: Positive for weakness.  Hematological: Negative.   Psychiatric/Behavioral: Positive for decreased concentration. The patient is nervous/anxious.     Physical Exam  Constitutional: She is oriented to person, place, and time. She appears well-developed and well-nourished.  Female; short of breath at rest, purse lip breathing  HENT:  Head: Normocephalic and atraumatic.  Neck: Normal range of motion.  Cardiovascular: Normal rate.   Pulmonary/Chest:  Increased work of breathing at rest Purse lipped breathing with conversation  Musculoskeletal: Normal range of motion.  Neurological: She is alert and oriented to person, place, and time.  Skin: Skin is warm and dry.  Psychiatric: Her behavior is normal. Judgment and thought content normal.  Describes her mood as no motivation no energy, concentration poor  Nursing note and vitals reviewed.   Vital Signs: BP 125/71 (BP Location: Right Arm)   Pulse 96   Temp (!) 97.5 F (36.4 C) (Oral)    Resp 20   Ht 5' 1" (1.549 m)   Wt 52 kg (114 lb 9.6 oz) Comment: b scale  SpO2 94%   BMI 21.65 kg/m  Pain Assessment: No/denies pain   Pain Score: 0-No pain   SpO2: SpO2: 94 % O2 Device:SpO2: 94 % O2 Flow Rate: .O2 Flow Rate (L/min): 3 L/min  IO: Intake/output summary:  Intake/Output Summary (Last 24 hours) at 06/03/17 1040 Last data filed at 06/03/17 0600  Gross per 24 hour  Intake           619.17 ml  Output              800 ml  Net          -180.83 ml    LBM: Last BM Date: 06/01/17 Baseline Weight: Weight: 51.3 kg (113 lb 1.6 oz) Most recent weight: Weight: 52 kg (114 lb 9.6 oz) (b scale)     Palliative Assessment/Data:   Flowsheet Rows     Most Recent Value  Intake Tab  Referral Department  Hospitalist  Unit at Time of Referral  Med/Surg Unit  Palliative Care Primary Diagnosis  Pulmonary  Date Notified  06/02/17  Palliative Care Type  Return patient Palliative Care  Reason for referral  Clarify Goals of Care, Psychosocial or Spiritual support  Date of Admission  06/02/17  Date first seen by Palliative Care  06/03/17  # of days Palliative referral response time  1 Day(s)  # of days IP prior to Palliative referral  0  Clinical Assessment  Palliative Performance Scale Score  50%  Pain Max last 24 hours  0  Pain Min   Last 24 hours  0  Dyspnea Max Last 24 Hours  7  Dyspnea Min Last 24 hours  4  Nausea Max Last 24 Hours  0  Nausea Min Last 24 Hours  0  Anxiety Max Last 24 Hours  4  Anxiety Min Last 24 Hours  2  Psychosocial & Spiritual Assessment  Palliative Care Outcomes  Patient/Family meeting held?  Yes  Who was at the meeting?  pt,  family mtg sch for 9/2 at [place]  Provided psychosocial or spiritual support  Patient/Family wishes: Interventions discontinued/not started   Mechanical Ventilation, Trach  Palliative Care follow-up planned  Yes, Facility      Time In: 0945 Time Out: 1045 Time Total: 60 min Greater than 50%  of  this time was spent counseling and coordinating care related to the above assessment and plan. Staffed with Dr. Carolin Sicks  Signed by: Dory Horn, NP   Please contact Palliative Medicine Team phone at [phone] for questions and concerns.  For individual provider: See Shea Evans

## 2017-06-03 NOTE — Progress Notes (Addendum)
PROGRESS NOTE    [name]  UQJ:[id number] DOB: [birth year] DOA: 06/02/2017 PCP: Glendale Chard, MD   Brief Narrative: 76 year old female with increased his COPD on home oxygen, CAD, diabetes, hypertension presented with worsening shortness of breath, cough, acute on chronic respiratory failure with hypoxia.  Assessment & Plan:  # Acute on chronic respiratory failure with hypoxia and hypercapnia due to acute COPD exacerbation: -Patient required BiPAP on admission. Currently on 2-3 L of oxygen. Discussed with the palliative care team today, CT scan of chest obtained which was consistent with severe COPD changes and 7 mm lung nodule which require outpatient follow-up. -Continue Solu-Medrol IV, bronchodilators -Shortness of breath is mildly better today but not at baseline. She will likely benefit from hospice care for end stage COPD. -Patient does not smoke currently.  #Hypertension: Monitor blood pressure. Discontinue IV fluid. Currently on Lasix oral.  #Type 2 diabetes: Continue insulin sliding scale. Monitor blood sugar level. I will add lispro 3 units 3 times a day with food. Patient is on a steroid.  #Anxiety depression: Continue home medication. Mood seems stable.  #Severe protein calorie malnutrition: Dietary referral.  PT OT evaluation.  DVT prophylaxis: Lovenox subcutaneous Code Status: DNR/DNI Family Communication: No family at bedside Disposition Plan: Currently admitted    Consultants:   Palliative care  Procedures: CT scan chest Antimicrobials: None  Subjective: Seen and examined at bedside. Reported shortness of breath is mildly improved but still not at baseline. Denied headache, dizziness, nausea or vomiting.  Objective: Vitals:   06/02/17 2125 06/03/17 0037 06/03/17 0657 06/03/17 0904  BP:   125/71   Pulse:   96   Resp:   20   Temp:   (!) 97.5 F (36.4 C)   TempSrc:   Oral   SpO2: 93% 94% 95% 94%  Weight:   52 kg (114 lb 9.6 oz)   Height:    5\' 1"  (1.549 m)     Intake/Output Summary (Last 24 hours) at 06/03/17 1202 Last data filed at 06/03/17 0600  Gross per 24 hour  Intake           619.17 ml  Output              800 ml  Net          -180.83 ml   Filed Weights   06/02/17 1856 06/03/17 0657  Weight: 51.3 kg (113 lb 1.6 oz) 52 kg (114 lb 9.6 oz)    Examination:  General exam: Sitting on bed with 2-3 L of oxygen  Respiratory system: Bilateral diffuse decreased breath sound, mild increase in respiratory effort Cardiovascular system: S1 & S2 heard, RRR.  No pedal edema. Gastrointestinal system: Abdomen is nondistended, soft and nontender. Normal bowel sounds heard. Central nervous system: Alert and oriented. No focal neurological deficits. Skin: No rashes, lesions or ulcers Psychiatry: Judgement and insight appear normal. Mood & affect appropriate.     Data Reviewed: I have personally reviewed following labs and imaging studies  CBC:  Recent Labs Lab 06/02/17 1203 06/03/17 0620  WBC 12.0* 7.9  NEUTROABS 8.4*  --   HGB 12.3 11.6*  HCT 40.3 37.5  MCV 103.9* 102.2*  PLT 258 389   Basic Metabolic Panel:  Recent Labs Lab 06/02/17 1230 06/03/17 0620  NA 142 141  K 4.6 5.0  CL 95* 94*  CO2 36* 38*  GLUCOSE 237* 263*  BUN 16 16  CREATININE 0.66 0.65  CALCIUM 9.0 9.3   GFR: Estimated Creatinine Clearance:  47.3 mL/min (by C-G formula based on SCr of 0.65 mg/dL). Liver Function Tests:  Recent Labs Lab 06/02/17 1230  AST 17  ALT 15  ALKPHOS 64  BILITOT 0.7  PROT 6.5  ALBUMIN 3.3*   No results for input(s): LIPASE, AMYLASE in the last 168 hours. No results for input(s): AMMONIA in the last 168 hours. Coagulation Profile: No results for input(s): INR, PROTIME in the last 168 hours. Cardiac Enzymes:  Recent Labs Lab 06/02/17 1818  TROPONINI <0.03   BNP (last 3 results) No results for input(s): PROBNP in the last 8760 hours. HbA1C:  Recent Labs  06/02/17 1458  HGBA1C 7.4*    CBG:  Recent Labs Lab 06/03/17 0753  GLUCAP 278*   Lipid Profile: No results for input(s): CHOL, HDL, LDLCALC, TRIG, CHOLHDL, LDLDIRECT in the last 72 hours. Thyroid Function Tests: No results for input(s): TSH, T4TOTAL, FREET4, T3FREE, THYROIDAB in the last 72 hours. Anemia Panel: No results for input(s): VITAMINB12, FOLATE, FERRITIN, TIBC, IRON, RETICCTPCT in the last 72 hours. Sepsis Labs: No results for input(s): PROCALCITON, LATICACIDVEN in the last 168 hours.  No results found for this or any previous visit (from the past 240 hour(s)).       Radiology Studies: Ct Chest Wo Contrast  Result Date: 06/03/2017 CLINICAL DATA:  COPD exacerbation EXAM: CT CHEST WITHOUT CONTRAST TECHNIQUE: Multidetector CT imaging of the chest was performed following the standard protocol without IV contrast. COMPARISON:  06/02/2017 and 11/26/2016 FINDINGS: Cardiovascular: Limited without IV contrast. Atherosclerosis of the major branch vessels and the thoracic aorta. No significant aneurysm. No mediastinal hemorrhage or hematoma. Native coronary atherosclerosis noted. Normal heart size. No pericardial effusion Mediastinum/Nodes: No enlarged mediastinal or axillary lymph nodes. Thyroid gland, trachea, and esophagus demonstrate no significant findings. Lungs/Pleura: Extensive pulmonary emphysema with hyperlucency and hyperinflation. Scattered apical and small bibasilar and right middle lobe areas of atelectasis versus scarring. Right middle lobe subpleural and peripheral noncalcified nodule measures 7 mm, image 105 series 4. Mild central bronchial wall thickening without significant bronchiectasis or mucus plugging. No pleural abnormality, pleural effusion, or pneumothorax. Upper Abdomen: No acute abnormality. Musculoskeletal: No soft tissue abnormality or asymmetry. No acute osseous finding. No compression fracture. Intact sternum. IMPRESSION: Advanced pulmonary emphysema pattern with hyperlucency and  hyperinflation. Associated mild chronic central bronchial wall thickening without occlusion or mucous plugging. Scattered parenchymal areas of subpleural atelectasis versus scarring No superimposed acute pneumonia, collapse, consolidation, or edema pattern 7 mm subpleural peripheral right middle lobe nodule. Non-contrast chest CT at 6-12 months is recommended. If the nodule is stable at time of repeat CT, then future CT at 18-24 months (from today's scan) is considered optional for low-risk patients, but is recommended for high-risk patients. This recommendation follows the consensus statement: Guidelines for Management of Incidental Pulmonary Nodules Detected on CT Images: From the Fleischner Society 2017; Radiology 2017; 284:228-243. Aortic Atherosclerosis (ICD10-I70.0) and Emphysema (ICD10-J43.9). Electronically Signed   By: Jerilynn Mages.  Shick M.D.   On: 06/03/2017 11:51   Dg Chest Portable 1 View  Result Date: 06/02/2017 CLINICAL DATA:  Shortness of breath. EXAM: PORTABLE CHEST 1 VIEW COMPARISON:  Chest x-ray dated November 26, 2016. FINDINGS: The cardiomediastinal silhouette is normal in size. Normal pulmonary vascularity. Hyperinflated lungs with unchanged emphysematous changes in the bilateral upper lobes. Coarsened interstitial markings, particularly within the lower lobes, appear slightly more prominent when compared to prior study. No focal consolidation. No pleural effusion or pneumothorax. No acute osseous abnormality. IMPRESSION: COPD.  No active cardiopulmonary disease. Electronically Signed  By: Titus Dubin M.D.   On: 06/02/2017 12:29        Scheduled Meds: . aspirin EC  81 mg Oral QHS  . cholecalciferol  2,000 Units Oral Daily  . clonazePAM  1 mg Oral QHS  . enoxaparin (LOVENOX) injection  40 mg Subcutaneous Q24H  . famotidine  20 mg Oral Daily  . feeding supplement (GLUCERNA SHAKE)  237 mL Oral BID BM  . fluticasone  2 spray Each Nare Daily  . furosemide  40 mg Oral Daily  .  guaiFENesin  600 mg Oral BID  . insulin aspart  0-5 Units Subcutaneous QHS  . insulin aspart  0-9 Units Subcutaneous TID WC  . ipratropium-albuterol  3 mL Nebulization TID  . loratadine  10 mg Oral Daily  . methylPREDNISolone (SOLU-MEDROL) injection  80 mg Intravenous Q12H  . mometasone-formoterol  2 puff Inhalation BID  . pantoprazole  40 mg Oral BID AC  . tiotropium  18 mcg Inhalation Daily   Continuous Infusions: . methocarbamol (ROBAXIN)  IV       LOS: 1 day    Talisha Erby Tanna Furry, MD [facility] Pager [phone]  If 7PM-7AM, please contact night-coverage [email or web address] Password [email or web address] 06/03/2017, 12:02 PM

## 2017-06-03 NOTE — Progress Notes (Addendum)
Initial Nutrition Assessment  DOCUMENTATION CODES:  Non-severe (moderate) malnutrition in context of chronic illness  INTERVENTION:  Given anorexia r/t chronic disease, may consider appetite stimulant if in line with GOC   Glucerna Shake po BID, each supplement provides 220 kcal and 10 grams of protein  Showed how to Utilize and order from Always available Menu  NUTRITION DIAGNOSIS:  Malnutrition related to chronic illness (Severe copd) w/ associated poor appetite, profound fatigue, and recent acute illness w/ associate nausea as evidenced by mild muscle loss, moderate fat loss and an estimated energy intake that has met </= to 75% of needs for >/= 1 month  GOAL:  Patient will meet greater than or equal to 90% of their needs  MONITOR:  PO intake, Supplement acceptance, Labs, Weight trends, Diet advancement, I & O's  REASON FOR ASSESSMENT:  Consult COPD Protocol  ASSESSMENT:  76 y/o female PMHx COPD, CAD, HTN, HLD, MI, DM.Presents with worsening SOB over past few months/ Has also had "cold" for which has been on 5 course of Abx. Worked up for COPD exacerbation and admitted for management.   SPoke with patient and her close friend at bedside with whom the patient talks with daily.   Though H&P notes her symptoms began with a cold 6 weeks ago, they state that her SOB has been going on "for months". Friend states the patient PO intake is highly variable. "Some days she will eat nothing and other days it will be better". When asked what "better" meant, she estimated 50% of her meals. Due to poor PO intake, she supplements all of vitamins. SHe takes a centrum mvi, Vit C, Vit D, Biotin. She drinks 1 Boost supplement each day.   Brief diet recall. Breakfast is best meal, will typically have hot cereal, pastry or cookies. Lunch is usually a sandwich and dinner is a small meal.   Notes BGs at home are 120-140 typically  Symptoms include N/D and fatigue. She has been nauseated for the past 2  weeks. SHe does not sound to have been taking any anti nausea medications for this. Thought related to all abx had been taking. She has occasional diarrhea that she has had prior to taking the abx. In regards to her fatigue, she has no energy whatsoever. SHe feels as tired when she wakes up in the morning as she does when she goes to bed. She does not leave her house. Never wants to get dressed  She endorses weight loss. Admit weight is 113 lbs. She says her UBW is "120 something" though they admit it has been greater than 1 year since she has weighed this much. Per chart review, she was weighing in mid 120's 2 years ago, but since January of 2017,  her UBW has been closer to 115-120 lbs. Shows slow gradual decline.   She was hesitant to try supplements; she is worried about how they taste and if they will increase her blood sugars. She could not name any foods she was interested in at this time. RD showed her the always available menu and how to order options.   NFPE: mild muscle wasting of deltoids, temporalis, interosseous, and clavicular musculature. Mild-moderate fat wasting of thoracic cavity  Labs: K: 5, BGs: 230-280, Albumin: 3.3, A1C was 7.4 Meds: Vit D, H2RA, LAsix, Methylprednisolone, PPI,    Recent Labs Lab 06/02/17 1230 06/03/17 0620  NA 142 141  K 4.6 5.0  CL 95* 94*  CO2 36* 38*  BUN 16 16  CREATININE  0.66 0.65  CALCIUM 9.0 9.3  GLUCOSE 237* 263*   Diet Order:  Diet regular Room service appropriate? Yes; Fluid consistency: Thin  Skin:  Reviewed, no issues  Last BM:  8/30  Height:  Ht Readings from Last 1 Encounters:  06/03/17 '5\' 1"'  (1.549 m)   Weight:  Wt Readings from Last 1 Encounters:  06/03/17 114 lb 9.6 oz (52 kg)   Wt Readings from Last 10 Encounters:  06/03/17 114 lb 9.6 oz (52 kg)  02/20/17 119 lb 9.6 oz (54.3 kg)  01/12/17 120 lb 12.8 oz (54.8 kg)  12/16/16 119 lb (54 kg)  12/15/16 118 lb 3.2 oz (53.6 kg)  11/30/16 111 lb 12.8 oz (50.7 kg)   09/06/16 117 lb (53.1 kg)  06/14/16 119 lb (54 kg)  03/25/16 119 lb 9.6 oz (54.3 kg)  12/02/15 118 lb 9.6 oz (53.8 kg)   Ideal Body Weight:  47.73 kg  BMI:  Body mass index is 21.65 kg/m.  Estimated Nutritional Needs:  Kcal:  1650-1850 (32-36 kcal/kg bw) Protein:  72-82g Pro (1.2-1.4 g/kg bw) Fluid:  >1.5 Liters (30 ml/kg bw)  EDUCATION NEEDS:  No education needs identified at this time  Burtis Junes RD, LDN, [place] [facility] Pager: [phone] 06/03/2017 11:59 AM

## 2017-06-03 NOTE — Evaluation (Signed)
Occupational Therapy Evaluation Patient Details Name: [name] MRN: [id number] DOB: November 15, 1940 Today's Date: 06/03/2017    History of Present Illness 76 yo female with onset of respiratory crisis, SOB and having worsening of breathing since a recent cold; now hypoxic and very tachycardic. PMHx:  COPD, CAD, HTN, DM, MI and URI's   Clinical Impression   PTA, pt had intermittent assistance from daughter and husband with ADL and was utilizing walker or cane at times for functional mobility. She currently requires min guard to min assist for LB ADL and ADL transfers. Pt presents with decreased activity tolerance for ADL, generalized weakness, and dyspnea on exertion impacting her ability to participate in ADL at [facility]. Pt able to maintain O2 saturation 94-96% on 4L O2 via [place] this session during both seated and standing ADL tasks. Discussed D/C recommendation of SNF level rehabilitation and pt reports strong preference to return home instead. Feel pt will require 24 hour assistance if to return home and will continue to update D/C recommendations as necessary. Will continue to follow while admitted.    Follow Up Recommendations  SNF;Supervision/Assistance - 24 hour    Equipment Recommendations  3 in 1 bedside commode    Recommendations for Other Services       Precautions / Restrictions Precautions Precautions: Fall Precaution Comments: Watch O2 sats Restrictions Weight Bearing Restrictions: No      Mobility Bed Mobility Overal bed mobility: Modified Independent             General bed mobility comments: using bedrail and HOB elevated due to SOB  Transfers Overall transfer level: Needs assistance Equipment used: 1 person hand held assist Transfers: Sit to/from Stand Sit to Stand: Min guard;Min assist         General transfer comment: Min assist for safety during ambulation. Pt reports legs feeling rubbery.     Balance Overall balance assessment: Needs  assistance Sitting-balance support: Feet supported Sitting balance-Leahy Scale: Fair     Standing balance support: Single extremity supported;Bilateral upper extremity supported;During functional activity Standing balance-Leahy Scale: Fair Standing balance comment: Able to maintain static standing balance without UE support. Relies on at least single UE support in standing position.                            ADL either performed or assessed with clinical judgement   ADL Overall ADL's : Needs assistance/impaired Eating/Feeding: Supervision/ safety;Sitting   Grooming: Set up;Supervision/safety;Sitting   Upper Body Bathing: Supervision/ safety;Sitting   Lower Body Bathing: Min guard;Sit to/from stand   Upper Body Dressing : Supervision/safety;Sitting   Lower Body Dressing: Min guard;Sit to/from stand   Toilet Transfer: Minimal assistance;Ambulation Toilet Transfer Details (indicate cue type and reason): Simulated with sit<>stand from EOB with ambulation in room.  Toileting- Water quality scientist and Hygiene: Min guard;Sit to/from stand       Functional mobility during ADLs: Minimal assistance General ADL Comments: Pt with decreased activity tolerance for ADL but was able to maintain SpO2 94-96% on 4L O2 throughout session during seated and standing ADL tasks. Able to tolerate only 5 minutes of standing activity.      Vision Baseline Vision/History: Wears glasses;Cataracts Wears Glasses: Reading only Patient Visual Report: No change from baseline Vision Assessment?: No apparent visual deficits Additional Comments: Reports needs to have a cataract removed.      Perception     Praxis      Pertinent Vitals/Pain Pain Assessment: No/denies pain  Hand Dominance Right   Extremity/Trunk Assessment Upper Extremity Assessment Upper Extremity Assessment: Overall WFL for tasks assessed   Lower Extremity Assessment Lower Extremity Assessment: Generalized  weakness   Cervical / Trunk Assessment Cervical / Trunk Assessment: Kyphotic   Communication Communication Communication: No difficulties   Cognition Arousal/Alertness: Awake/alert Behavior During Therapy: WFL for tasks assessed/performed Overall Cognitive Status: Within Functional Limits for tasks assessed                                     General Comments  Pt with SpO2 ranging from 94-96% throughout seated and standing activities on 4L O2    Exercises     Shoulder Instructions      Home Living Family/patient expects to be discharged to:: Private residence Living Arrangements: Spouse/significant other Available Help at Discharge:  (husband works and daughter assists but pt is alone at times) Type of Home: House Home Access: Stairs to enter Technical brewer of Steps: 3 Entrance Stairs-Rails: Right;Left;Can reach both Home Layout: One level     Bathroom Shower/Tub: Tub/shower unit;Walk-in shower   Bathroom Toilet: Handicapped height     Home Equipment: Environmental consultant - 4 wheels;Cane - single point;Wheelchair - manual;Shower seat - built in;Hand held shower head          Prior Functioning/Environment Level of Independence: Independent with assistive device(s)        Comments: Varying O2 via [place]. Husband assists with heavier housework.        OT Problem List: Cardiopulmonary status limiting activity;Decreased strength;Decreased range of motion;Impaired balance (sitting and/or standing);Decreased activity tolerance;Decreased safety awareness;Decreased knowledge of precautions      OT Treatment/Interventions: Self-care/ADL training;Therapeutic exercise;Energy conservation;Therapeutic activities;Patient/family education;Balance training    OT Goals(Current goals can be found in the care plan section) Acute Rehab OT Goals Patient Stated Goal: to feel stronger and get home OT Goal Formulation: With patient Time For Goal Achievement:  06/17/17 Potential to Achieve Goals: Good  OT Frequency: Min 2X/week   Barriers to D/C:            Co-evaluation              AM-PAC PT "6 Clicks" Daily Activity     Outcome Measure Help from another person eating meals?: A Little Help from another person taking care of personal grooming?: A Little Help from another person toileting, which includes using toliet, bedpan, or urinal?: A Little Help from another person bathing (including washing, rinsing, drying)?: A Little Help from another person to put on and taking off regular upper body clothing?: A Little Help from another person to put on and taking off regular lower body clothing?: A Little 6 Click Score: 18   End of Session Nurse Communication:  (Nurse tech - pt urinated and ready for collection; mobility status)  Activity Tolerance: Patient tolerated treatment well Patient left: in bed;with call bell/phone within reach;with SCD's reapplied  OT Visit Diagnosis: Unsteadiness on feet (R26.81);Muscle weakness (generalized) (M62.81)                Time: 1530-1610 OT Time Calculation (min): 40 min Charges:  OT General Charges $OT Visit: 1 Visit OT Evaluation $OT Eval Moderate Complexity: 1 Mod OT Treatments $Self Care/Home Management : 23-37 mins G-Codes:     Norman Herrlich, MS OTR/L  Pager: [place] A Dominque Marlin 06/03/2017, 5:50 PM

## 2017-06-03 NOTE — Evaluation (Signed)
Physical Therapy Evaluation Patient Details Name: [name] MRN: [id number] DOB: [birth year] Today's Date: 06/03/2017   History of Present Illness  76 yo female with onset of respiratory crisis, SOB and having worsening of breathing since a recent cold; now hypoxic and very tachycardic. PMHx:  COPD, CAD, HTN, DM, MI and URI's  Clinical Impression  Pt is up to transfer to [facility] and had extremely low O2 sats, down to 80% on 3L O2. Reported to nursing and did watch her values just sitting and talking but were again very low.  Pt is trending to 88% with talking and doing minor LE movement.  Will recommend SNF as pt is home alone at times, however she is likely to decline this.  Will follow acutely to progress strengthening and stair climbing when her hypoxia is more controlled.    Follow Up Recommendations SNF    Equipment Recommendations  None recommended by PT    Recommendations for Other Services       Precautions / Restrictions Precautions Precautions: Fall (telemetry) Restrictions Weight Bearing Restrictions: No Other Position/Activity Restrictions: hypoxic with standing even on 3L O2      Mobility  Bed Mobility Overal bed mobility: Modified Independent             General bed mobility comments: using bedrail and HOB elevated due to SOB  Transfers Overall transfer level: Needs assistance Equipment used: 1 person hand held assist Transfers: Sit to/from Stand Sit to Stand: Supervision;Min guard         General transfer comment: Pt desaturated with effort to go bed to [facility] to bed down to 80% on 3L O2.  Ambulation/Gait             General Gait Details: deferred due to desaturation on 3LO2  Stairs            Wheelchair Mobility    Modified Rankin (Stroke Patients Only)       Balance Overall balance assessment: Needs assistance Sitting-balance support: Feet supported Sitting balance-Leahy Scale: Fair     Standing balance support: Bilateral  upper extremity supported;During functional activity (on bedrail and commode rails) Standing balance-Leahy Scale: Fair Standing balance comment: fair with UE support                             Pertinent Vitals/Pain Pain Assessment: No/denies pain    Home Living Family/patient expects to be discharged to:: Private residence Living Arrangements: Spouse/significant other Available Help at Discharge: Available PRN/intermittently (husband works and daughter assists but is alone at times) Type of Home: House Home Access: Stairs to enter Entrance Stairs-Rails: Right;Left;Can reach both Technical brewer of Steps: 3 Home Layout: One level Home Equipment: [place] - 4 wheels;Cane - single point;Wheelchair - Brewing technologist - built in;Hand held shower head      Prior Function Level of Independence: Independent with assistive device(s)         Comments: 2L O2 via nasal cannula, husband assisted with heavier housework     Hand Dominance   Dominant Hand: Right    Extremity/Trunk Assessment   Upper Extremity Assessment Upper Extremity Assessment: Overall WFL for tasks assessed    Lower Extremity Assessment Lower Extremity Assessment: Generalized weakness    Cervical / Trunk Assessment Cervical / Trunk Assessment: Kyphotic  Communication   Communication: No difficulties  Cognition Arousal/Alertness: Awake/alert Behavior During Therapy: WFL for tasks assessed/performed Overall Cognitive Status: Within Functional Limits for tasks assessed  General Comments General comments (skin integrity, edema, etc.): Pt is apparently non compliant at home with AD's and has rollator and SPC for support, but not consistently used    Exercises     Assessment/Plan    PT Assessment Patient needs continued PT services  PT Problem List Decreased strength;Decreased activity tolerance;Decreased balance;Decreased  mobility;Decreased coordination;Decreased knowledge of use of DME;Decreased safety awareness;Cardiopulmonary status limiting activity       PT Treatment Interventions DME instruction;Gait training;Stair training;Functional mobility training;Therapeutic activities;Therapeutic exercise;Balance training;Neuromuscular re-education;Patient/family education    PT Goals (Current goals can be found in the Care Plan section)  Acute Rehab PT Goals Patient Stated Goal: to feel stronger and get home PT Goal Formulation: With patient Time For Goal Achievement: 06/17/17 Potential to Achieve Goals: Good    Frequency Min 3X/week   Barriers to discharge Decreased caregiver support;Inaccessible home environment has stairs to enter house and has some time alone    Co-evaluation               AM-PAC PT "6 Clicks" Daily Activity  Outcome Measure Difficulty turning over in bed (including adjusting bedclothes, sheets and blankets)?: A Little Difficulty moving from lying on back to sitting on the side of the bed? : A Little Difficulty sitting down on and standing up from a chair with arms (e.g., wheelchair, bedside commode, etc,.)?: A Little Help needed moving to and from a bed to chair (including a wheelchair)?: A Little Help needed walking in hospital room?: A Little Help needed climbing 3-5 steps with a railing? : Total 6 Click Score: 16    End of Session Equipment Utilized During Treatment: Oxygen Activity Tolerance: Treatment limited secondary to medical complications (Comment);Other (comment) (O2 sats dropped to 80% with BSC transfer) Patient left: in bed;with call bell/phone within reach;with bed alarm set;with family/visitor present;with nursing/sitter in room Nurse Communication: Mobility status PT Visit Diagnosis: Other abnormalities of gait and mobility (R26.89);Muscle weakness (generalized) (M62.81);Other (comment) (desaturation of O2 with effort)    Time: 6962-9528 PT Time  Calculation (min) (ACUTE ONLY): 33 min   Charges:   PT Evaluation $PT Eval Moderate Complexity: 1 Mod PT Treatments $Therapeutic Exercise: 8-22 mins   PT G Codes:   PT G-Codes **NOT FOR INPATIENT CLASS** Functional Assessment Tool Used: AM-PAC 6 Clicks Basic Mobility    Ramond Dial 06/03/2017, 12:37 PM   Mee Hives, PT MS Acute Rehab Dept. Number: [place]

## 2017-06-03 NOTE — Progress Notes (Signed)
Pt is stable, vitals stable, no any complain of SOB, CP. IV robaxin given per complain of neck muscles pain, double checked with MD regarding pt's diet which is regular in the system, will continue to monitor

## 2017-06-04 DIAGNOSIS — E1165 Type 2 diabetes mellitus with hyperglycemia: Secondary | ICD-10-CM

## 2017-06-04 LAB — GLUCOSE, CAPILLARY
GLUCOSE-CAPILLARY: 324 mg/dL — AB (ref 65–99)
Glucose-Capillary: 274 mg/dL — ABNORMAL HIGH (ref 65–99)
Glucose-Capillary: 228 mg/dL — ABNORMAL HIGH (ref 65–99)
Glucose-Capillary: 155 mg/dL — ABNORMAL HIGH (ref 65–99)

## 2017-06-04 LAB — BASIC METABOLIC PANEL
Anion gap: 10 (ref 5–15)
BUN: 21 mg/dL — ABNORMAL HIGH (ref 6–20)
CALCIUM: 8.4 mg/dL — AB (ref 8.9–10.3)
CHLORIDE: 94 mmol/L — AB (ref 101–111)
CO2: 33 mmol/L — ABNORMAL HIGH (ref 22–32)
CREATININE: 0.68 mg/dL (ref 0.44–1.00)
GFR calc non Af Amer: >60 mL/min (ref >60)
Glucose, Bld: 339 mg/dL — ABNORMAL HIGH (ref 65–99)
Potassium: 5.4 mmol/L — ABNORMAL HIGH (ref 3.5–5.1)
SODIUM: 137 mmol/L (ref 135–145)

## 2017-06-04 MED ORDER — INSULIN ASPART 100 UNIT/ML ~~LOC~~ SOLN
5.0000 [IU] | Freq: Three times a day (TID) | SUBCUTANEOUS | Status: DC
  Administered 2017-06-04 – 2017-06-05 (×2): 5 [IU] via SUBCUTANEOUS

## 2017-06-04 MED ORDER — DEXTROSE 5 % IV SOLN
500.0000 mg | INTRAVENOUS | Status: DC
  Administered 2017-06-04 – 2017-06-06 (×3): 500 mg via INTRAVENOUS
  Filled 2017-06-04 (×3): qty 500

## 2017-06-04 MED ORDER — SODIUM POLYSTYRENE SULFONATE 15 GM/60ML PO SUSP
30.0000 g | Freq: Once | ORAL | Status: AC
  Administered 2017-06-04: 30 g via ORAL
  Filled 2017-06-04: qty 120

## 2017-06-04 MED ORDER — INSULIN GLARGINE 100 UNIT/ML ~~LOC~~ SOLN
15.0000 [IU] | Freq: Every day | SUBCUTANEOUS | Status: DC
  Administered 2017-06-04 – 2017-06-05 (×2): 15 [IU] via SUBCUTANEOUS
  Filled 2017-06-04 (×2): qty 0.15

## 2017-06-04 NOTE — Progress Notes (Signed)
                                                                                                                                                                                                         Daily Progress Note   Patient Name: [name]       Date: 06/04/2017 DOB: 03/25/1941  Age: 76 y.o. MRN#: [id number] Attending Physician: Bhandari, Dron Prasad, MD Primary Care Physician: Sanders, Robyn, MD Admit Date: 06/02/2017  Reason for Consultation/Follow-up: Establishing goals of care and Psychosocial/spiritual support  Subjective: Patient is alert and oriented. She still remains very short of breath especially with any exertion. Per chart review, physical therapy working with patient, her sats dropped into the 80s with just walking to bedside commode. CT of the chest was performed and consistent with COPD. It did show a 7 mm lung nodule with recommended outpatient follow-up  Length of Stay: 2  Current Medications: Scheduled Meds:  . aspirin EC  81 mg Oral QHS  . cholecalciferol  2,000 Units Oral Daily  . clonazePAM  1 mg Oral QHS  . enoxaparin (LOVENOX) injection  40 mg Subcutaneous Q24H  . feeding supplement (GLUCERNA SHAKE)  237 mL Oral BID BM  . fluticasone  2 spray Each Nare Daily  . furosemide  40 mg Oral Daily  . guaiFENesin  600 mg Oral BID  . insulin aspart  0-5 Units Subcutaneous QHS  . insulin aspart  0-9 Units Subcutaneous TID WC  . insulin aspart  5 Units Subcutaneous TID WC  . insulin glargine  15 Units Subcutaneous Daily  . ipratropium-albuterol  3 mL Nebulization TID  . loratadine  10 mg Oral Daily  . methylPREDNISolone (SOLU-MEDROL) injection  80 mg Intravenous Q12H  . mometasone-formoterol  2 puff Inhalation BID  . pantoprazole  40 mg Oral BID AC    Continuous Infusions: . azithromycin 500 mg (06/04/17 1230)  . methocarbamol (ROBAXIN)  IV 500 mg (06/04/17 0556)    PRN Meds: acetaminophen **OR** acetaminophen, albuterol, amitriptyline, gabapentin,  nitroGLYCERIN, ondansetron **OR** ondansetron (ZOFRAN) IV, ondansetron, senna-docusate  Physical Exam  Constitutional: She is oriented to person, place, and time. She appears well-developed.  HENT:  Head: Normocephalic and atraumatic.  Neck: Normal range of motion.  Pulmonary/Chest:  Increased work of breathing at rest  Musculoskeletal: Normal range of motion.  Neurological: She is alert and oriented to person, place, and time.  Skin: Skin is warm and dry.  Psychiatric: She has a normal mood and affect. Her behavior is normal. Judgment and thought content normal.  Nursing   note and vitals reviewed.           Vital Signs: BP 125/61 (BP Location: Right Arm)   Pulse (!) 102   Temp 97.8 F (36.6 C) (Oral)   Resp 20   Ht 5' 1" (1.549 m)   Wt 51.9 kg (114 lb 8 oz)   SpO2 98%   BMI 21.63 kg/m  SpO2: SpO2: 98 % O2 Device: O2 Device: Nasal Cannula O2 Flow Rate: O2 Flow Rate (L/min): 2 L/min  Intake/output summary:  Intake/Output Summary (Last 24 hours) at 06/04/17 1241 Last data filed at 06/03/17 2306  Gross per 24 hour  Intake              775 ml  Output             1100 ml  Net             -325 ml   LBM: Last BM Date: 06/02/17 Baseline Weight: Weight: 51.3 kg (113 lb 1.6 oz) Most recent weight: Weight: 51.9 kg (114 lb 8 oz)       Palliative Assessment/Data:    Flowsheet Rows     Most Recent Value  Intake Tab  Referral Department  Hospitalist  Unit at Time of Referral  Med/Surg Unit  Palliative Care Primary Diagnosis  Pulmonary  Date Notified  06/02/17  Palliative Care Type  Return patient Palliative Care  Reason for referral  Clarify Goals of Care, Psychosocial or Spiritual support  Date of Admission  06/02/17  Date first seen by Palliative Care  06/03/17  # of days Palliative referral response time  1 Day(s)  # of days IP prior to Palliative referral  0  Clinical Assessment  Palliative Performance Scale Score  50%  Pain Max last 24 hours  0  Pain Min Last 24  hours  0  Dyspnea Max Last 24 Hours  7  Dyspnea Min Last 24 hours  4  Nausea Max Last 24 Hours  0  Nausea Min Last 24 Hours  0  Anxiety Max Last 24 Hours  4  Anxiety Min Last 24 Hours  2  Psychosocial & Spiritual Assessment  Palliative Care Outcomes  Patient/Family meeting held?  Yes  Who was at the meeting?  pt,  family mtg sch for 9/2 at [place]  Provided psychosocial or spiritual support  Patient/Family wishes: Interventions discontinued/not started   Mechanical Ventilation, Trach  Palliative Care follow-up planned  Yes, Facility      Patient Active Problem List   Diagnosis Date Noted  . Malnutrition of moderate degree 06/03/2017  . Palliative care by specialist   . Goals of care, counseling/discussion   . Palliative care encounter   . Acute diastolic CHF (congestive heart failure) ([place])   . Acute bronchitis   . Leukocytosis   . Depression   . Acute respiratory failure with hypoxia and hypercapnia ([place]) 11/24/2016  . DM2 (diabetes mellitus, type 2) ([place]) 11/24/2016  . Post herpetic neuralgia 09/06/2016  . Rhinitis, chronic 03/25/2016  . Counseling regarding end of life decision making 03/25/2016  . Acute respiratory failure with hypoxemia ([place])   . COPD exacerbation ([place])   . Respiratory failure with hypoxia and hypercapnia ([place] [address] [place]) 10/12/2015  . Acute on chronic respiratory failure ([place]) 10/12/2015  . Shoulder pain, left 06/05/2015  . Right-sided chest wall pain 12/21/2014  . Respiratory acidosis 02/16/2012  . Palpitations 12/21/2011  . CHEST PAIN, PRECORDIAL 07/06/2010  . CAROTID ARTERY DISEASE 09/22/2009  .  FATIGUE 04/10/2009  . HYPERLIPIDEMIA 04/09/2009  . Tobacco use disorder, severe, in sustained remission 04/09/2009  . Essential hypertension 04/09/2009  . Coronary atherosclerosis 04/09/2009  . COPD (chronic obstructive pulmonary disease) with chronic bronchitis ([facility]) 04/09/2009    Palliative Care Assessment & Plan   Patient Profile: 75  y.o. female  with past medical history of Coronary artery disease with history of MI, stenting; diabetes, hypertension, COPD (diagnosed in her 30s) history of intubation in 2017 admitted on 06/02/2017 with shortness of breath. Per chart review, patient has had an upper respiratory and infection, and has been on steroids as well as antibiotics at home without improvement. She reports that her oxygen sats were dropping particularly with any movement and she developed worsening shortness of breath  Patient was last admitted to the hospital in February 2018. At that time she saw  palliative medicine provider during that hospitalization. Patient states that she is never bounced back from her hospitalization in February, 2018. She states she has no energy, no motivation, and her appetite has become very poor. She is followed by pulmonologist, Dr. Young, with [facility] for "end-of-life", goals of care.   Assessment: Met with patient's husband, 3 children, patient and reviewed current clinical status. We also discussed in length what to expect going forward and what was of importance to patient at this point in her life. We also addressed hospice care services to help support her in the home which she and her family are receptive to  Recommendations/Plan:  Dyspnea: In addition to targeted pulmonary treatments such as oxygen, nebulizer treatments, steroids is indicated for exacerbations, low-dose opioids such as morphine concentrate could help with severe shortness of breath. Educated patient and family as to the role of opioids in alleviating shortness of breath. Low-dose opioids though would likely only be prescribed through hospice   Code Status:    Code Status Orders        Start     Ordered   06/02/17 1555  Do not attempt resuscitation (DNR)  Continuous    Question Answer Comment  In the event of cardiac or respiratory ARREST Do not call a "code blue"   In the event of cardiac or  respiratory ARREST Do not perform Intubation, CPR, defibrillation or ACLS   In the event of cardiac or respiratory ARREST Use medication by any route, position, wound care, and other measures to relive pain and suffering. May use oxygen, suction and manual treatment of airway obstruction as needed for comfort.      06/02/17 1554    Code Status History    Date Active Date Inactive Code Status Order ID Comments User Context   06/02/2017  2:56 PM 06/02/2017  3:54 PM Partial Code [id number]  Black, Karen M, NP ED   11/24/2016  5:27 AM 11/30/2016  4:25 PM Full Code [id number]  Gardner, Jared M, DO ED   10/12/2015  9:12 AM 10/19/2015  4:46 PM Full Code [id number]  Tukov, Magadalene S, NP ED       Prognosis:  < 6 months in the setting of end-stage COPD with comorbidities of coronary artery disease, diastolic heart failure, diabetes, hypertension history of MI      Discharge Planning:  SNF recommended per physical therapy as well as occupational therapist's notes; the patient is requesting to return to her home with hospice services. Order placed to care management  Care plan was discussed with Dr. Bhandari  Thank you for allowing the Palliative Medicine Team to assist   in the care of this patient.   Time In: 1400 Time Out: 1500 Total Time 60 min Prolonged Time Billed  no        Greater than 50%  of this time was spent counseling and coordinating care related to the above assessment and plan.  Sarah Grace Bullard, NP  Please contact Palliative Medicine Team phone at [phone] for questions and concerns.      

## 2017-06-04 NOTE — Progress Notes (Addendum)
PROGRESS NOTE    [name]  UXN:[id number] DOB: 06-Jan-1941 DOA: 06/02/2017 PCP: Glendale Chard, MD   Brief Narrative: 76 year old female with increased his COPD on home oxygen, CAD, diabetes, hypertension presented with worsening shortness of breath, cough, acute on chronic respiratory failure with hypoxia.  Assessment & Plan:  # Acute on chronic respiratory failure with hypoxia and hypercapnia due to acute COPD exacerbation: -Patient required BiPAP on admission. Currently on 2-3 L of oxygen.  CT scan of chest obtained which was consistent with severe COPD changes and 7 mm lung nodule which require outpatient follow-up. I reviewed the imaging test result and nodal with the patient and her husband at bedside. I discussed about end stage COPD and nature of disease. Palliative care is following and plan for family meeting today. Patient may benefit from home hospice care. -Patient has a cough with yellowish phlegm therefore I added azithromycin. Plan to continue IV Solu-Medrol. Continue bronchodilators. Recommended to follow-up with pulmonologist for lung nodule. -Patient does not smoke currently.  #Hypertension: Monitor blood pressure. Discontinue IV fluid. Currently on Lasix oral.  #Type 2 diabetes with hyperglycemia likely in the setting of steroid: Patient with elevated blood sugar level therefore I added Lantus. 10 units and 5 units of NovoLog before meals. Continue sliding scale. A1c of 7.4 .  #Anxiety depression: Continue home medication. Mood seems stable.  #Severe protein calorie malnutrition: Dietary referral.  #Mild hyperkalemia likely contributed by hyperglycemia: Order a dose of Kayexalate. Low potassium diet. Repeat BMP in the morning  PT OT evaluation. Patient wants to go home on discharge  DVT prophylaxis: Lovenox subcutaneous Code Status: DNR/DNI Family Communication: Discussed with the patient's husband at bedside at length Disposition Plan: Currently  admitted    Consultants:   Palliative care  Procedures: CT scan chest Antimicrobials: Azithromycin started on September 2  Subjective: Seen and examined at bedside. Still has shortness of breath and cough with occasional yellowish phlegm. Denied headache, dizziness, nausea or vomiting. Has tremors Objective: Vitals:   06/03/17 1347 06/03/17 2119 06/04/17 0700 06/04/17 0845  BP:  125/61    Pulse:  (!) 102    Resp:  20 20   Temp:  97.8 F (36.6 C)    TempSrc:  Oral Oral   SpO2: 95% 99%  98%  Weight:   51.9 kg (114 lb 8 oz)   Height:        Intake/Output Summary (Last 24 hours) at 06/04/17 1231 Last data filed at 06/03/17 2306  Gross per 24 hour  Intake              775 ml  Output             1100 ml  Net             -325 ml   Filed Weights   06/02/17 1856 06/03/17 0657 06/04/17 0700  Weight: 51.3 kg (113 lb 1.6 oz) 52 kg (114 lb 9.6 oz) 51.9 kg (114 lb 8 oz)    Examination:  General exam: Ill-looking female sitting on bed with 2-3 L of oxygen  Respiratory system: Bilateral diffuse decreased breath sound, no crackles Cardiovascular system: Regular rate rhythm, S1-S2 normal. No pedal edema.. Gastrointestinal system: Abdomen is nondistended, soft and nontender. Normal bowel sounds heard. Central nervous system: Alert and oriented. No focal neurological deficits. Skin: No rashes, lesions or ulcers Psychiatry: Judgement and insight appear normal. Mood & affect appropriate.     Data Reviewed: I have personally reviewed following labs  and imaging studies  CBC:  Recent Labs Lab 06/02/17 1203 06/03/17 0620  WBC 12.0* 7.9  NEUTROABS 8.4*  --   HGB 12.3 11.6*  HCT 40.3 37.5  MCV 103.9* 102.2*  PLT 258 325   Basic Metabolic Panel:  Recent Labs Lab 06/02/17 1230 06/03/17 0620 06/04/17 0547  NA 142 141 137  K 4.6 5.0 5.4*  CL 95* 94* 94*  CO2 36* 38* 33*  GLUCOSE 237* 263* 339*  BUN 16 16 21*  CREATININE 0.66 0.65 0.68  CALCIUM 9.0 9.3 8.4*    GFR: Estimated Creatinine Clearance: 47.3 mL/min (by C-G formula based on SCr of 0.68 mg/dL). Liver Function Tests:  Recent Labs Lab 06/02/17 1230  AST 17  ALT 15  ALKPHOS 64  BILITOT 0.7  PROT 6.5  ALBUMIN 3.3*   No results for input(s): LIPASE, AMYLASE in the last 168 hours. No results for input(s): AMMONIA in the last 168 hours. Coagulation Profile: No results for input(s): INR, PROTIME in the last 168 hours. Cardiac Enzymes:  Recent Labs Lab 06/02/17 1818  TROPONINI <0.03   BNP (last 3 results) No results for input(s): PROBNP in the last 8760 hours. HbA1C:  Recent Labs  06/02/17 1458  HGBA1C 7.4*   CBG:  Recent Labs Lab 06/03/17 0753 06/03/17 1234 06/03/17 2123 06/04/17 0754 06/04/17 1158  GLUCAP 278* 247* 294* 324* 274*   Lipid Profile: No results for input(s): CHOL, HDL, LDLCALC, TRIG, CHOLHDL, LDLDIRECT in the last 72 hours. Thyroid Function Tests: No results for input(s): TSH, T4TOTAL, FREET4, T3FREE, THYROIDAB in the last 72 hours. Anemia Panel: No results for input(s): VITAMINB12, FOLATE, FERRITIN, TIBC, IRON, RETICCTPCT in the last 72 hours. Sepsis Labs: No results for input(s): PROCALCITON, LATICACIDVEN in the last 168 hours.  No results found for this or any previous visit (from the past 240 hour(s)).       Radiology Studies: Ct Chest Wo Contrast  Result Date: 06/03/2017 CLINICAL DATA:  COPD exacerbation EXAM: CT CHEST WITHOUT CONTRAST TECHNIQUE: Multidetector CT imaging of the chest was performed following the standard protocol without IV contrast. COMPARISON:  06/02/2017 and 11/26/2016 FINDINGS: Cardiovascular: Limited without IV contrast. Atherosclerosis of the major branch vessels and the thoracic aorta. No significant aneurysm. No mediastinal hemorrhage or hematoma. Native coronary atherosclerosis noted. Normal heart size. No pericardial effusion Mediastinum/Nodes: No enlarged mediastinal or axillary lymph nodes. Thyroid gland,  trachea, and esophagus demonstrate no significant findings. Lungs/Pleura: Extensive pulmonary emphysema with hyperlucency and hyperinflation. Scattered apical and small bibasilar and right middle lobe areas of atelectasis versus scarring. Right middle lobe subpleural and peripheral noncalcified nodule measures 7 mm, image 105 series 4. Mild central bronchial wall thickening without significant bronchiectasis or mucus plugging. No pleural abnormality, pleural effusion, or pneumothorax. Upper Abdomen: No acute abnormality. Musculoskeletal: No soft tissue abnormality or asymmetry. No acute osseous finding. No compression fracture. Intact sternum. IMPRESSION: Advanced pulmonary emphysema pattern with hyperlucency and hyperinflation. Associated mild chronic central bronchial wall thickening without occlusion or mucous plugging. Scattered parenchymal areas of subpleural atelectasis versus scarring No superimposed acute pneumonia, collapse, consolidation, or edema pattern 7 mm subpleural peripheral right middle lobe nodule. Non-contrast chest CT at 6-12 months is recommended. If the nodule is stable at time of repeat CT, then future CT at 18-24 months (from today's scan) is considered optional for low-risk patients, but is recommended for high-risk patients. This recommendation follows the consensus statement: Guidelines for Management of Incidental Pulmonary Nodules Detected on CT Images: From the Fleischner Society 2017; Radiology  2017; 326:712-458. Aortic Atherosclerosis (ICD10-I70.0) and Emphysema (ICD10-J43.9). Electronically Signed   By: Jerilynn Mages.  Shick M.D.   On: 06/03/2017 11:51        Scheduled Meds: . aspirin EC  81 mg Oral QHS  . cholecalciferol  2,000 Units Oral Daily  . clonazePAM  1 mg Oral QHS  . enoxaparin (LOVENOX) injection  40 mg Subcutaneous Q24H  . feeding supplement (GLUCERNA SHAKE)  237 mL Oral BID BM  . fluticasone  2 spray Each Nare Daily  . furosemide  40 mg Oral Daily  . guaiFENesin  600  mg Oral BID  . insulin aspart  0-5 Units Subcutaneous QHS  . insulin aspart  0-9 Units Subcutaneous TID WC  . insulin aspart  5 Units Subcutaneous TID WC  . insulin glargine  15 Units Subcutaneous Daily  . ipratropium-albuterol  3 mL Nebulization TID  . loratadine  10 mg Oral Daily  . methylPREDNISolone (SOLU-MEDROL) injection  80 mg Intravenous Q12H  . mometasone-formoterol  2 puff Inhalation BID  . pantoprazole  40 mg Oral BID AC  . tiotropium  18 mcg Inhalation Daily   Continuous Infusions: . azithromycin 500 mg (06/04/17 1230)  . methocarbamol (ROBAXIN)  IV 500 mg (06/04/17 0556)     LOS: 2 days    Robbin Escher Tanna Furry, MD [facility] Pager [phone]  If 7PM-7AM, please contact night-coverage [email or web address] Password [facility] 06/04/2017, 12:31 PM

## 2017-06-04 NOTE — Progress Notes (Signed)
Vitals stable, no any specific complain of CP and SOB, IV ABX continue, eating and drinking well, tolerating meds, will continue to monitor

## 2017-06-05 DIAGNOSIS — E440 Moderate protein-calorie malnutrition: Secondary | ICD-10-CM

## 2017-06-05 LAB — GLUCOSE, CAPILLARY
GLUCOSE-CAPILLARY: 237 mg/dL — AB (ref 65–99)
GLUCOSE-CAPILLARY: 162 mg/dL — AB (ref 65–99)
Glucose-Capillary: 272 mg/dL — ABNORMAL HIGH (ref 65–99)
Glucose-Capillary: 307 mg/dL — ABNORMAL HIGH (ref 65–99)

## 2017-06-05 LAB — BASIC METABOLIC PANEL
Anion gap: 10 (ref 5–15)
BUN: 22 mg/dL — AB (ref 6–20)
CO2: 39 mmol/L — AB (ref 22–32)
Calcium: 8.4 mg/dL — ABNORMAL LOW (ref 8.9–10.3)
Chloride: 90 mmol/L — ABNORMAL LOW (ref 101–111)
Creatinine, Ser: 0.73 mg/dL (ref 0.44–1.00)
GFR calc Af Amer: >60 mL/min (ref >60)
GFR calc non Af Amer: >60 mL/min (ref >60)
GLUCOSE: 228 mg/dL — AB (ref 65–99)
POTASSIUM: 3.5 mmol/L (ref 3.5–5.1)
SODIUM: 139 mmol/L (ref 135–145)

## 2017-06-05 MED ORDER — INSULIN GLARGINE 100 UNIT/ML ~~LOC~~ SOLN
20.0000 [IU] | Freq: Every day | SUBCUTANEOUS | Status: DC
  Administered 2017-06-06 – 2017-06-07 (×2): 20 [IU] via SUBCUTANEOUS
  Filled 2017-06-05 (×2): qty 0.2

## 2017-06-05 MED ORDER — INSULIN ASPART 100 UNIT/ML ~~LOC~~ SOLN
7.0000 [IU] | Freq: Three times a day (TID) | SUBCUTANEOUS | Status: DC
  Administered 2017-06-05 – 2017-06-06 (×4): 7 [IU] via SUBCUTANEOUS

## 2017-06-05 MED ORDER — METHOCARBAMOL 500 MG PO TABS
500.0000 mg | ORAL_TABLET | Freq: Three times a day (TID) | ORAL | Status: DC
  Administered 2017-06-05 – 2017-06-07 (×6): 500 mg via ORAL
  Filled 2017-06-05 (×6): qty 1

## 2017-06-05 NOTE — Progress Notes (Signed)
PROGRESS NOTE    [name]  ZDG:[id number] DOB: Dec 06, 1940 DOA: 06/02/2017 PCP: Glendale Chard, MD   Brief Narrative: 76 year old female with increased his COPD on home oxygen, CAD, diabetes, hypertension presented with worsening shortness of breath, cough, acute on chronic respiratory failure with hypoxia.  Assessment & Plan:  # Acute on chronic respiratory failure with hypoxia and hypercapnia due to acute COPD exacerbation: -Patient required BiPAP on admission. Currently on 2-3 L of oxygen.  CT scan of chest obtained which was consistent with severe COPD changes and 7 mm lung nodule which require outpatient follow-up. I reviewed the imaging test result and nodal with the patient and her husband at bedside. I discussed about end stage COPD and nature of disease. Palliative care is following. Patient will benefit from home hospice care on discharge. -Still having shortness of breath and cough and therefore plan to continue IV Solu-Medrol, azithromycin and bronchodilators. - Recommended to follow-up with pulmonologist for lung nodule. -Patient does not smoke currently.  #Hypertension: Monitor blood pressure. Currently on Lasix oral.  #Type 2 diabetes with hyperglycemia  in the setting of steroid: Patient has elevated blood sugar level therefore I increased the dose of Lantus to 20 units and NovoLog to 7 units before meals. Continue sliding scale. May require lower doses of insulin while tapering steroid. I reviewed this with the patient and her husband at bedside. Continue sliding scale. A1c of 7.4 .  #Anxiety depression: Continue home medication. Mood is stable.  #Severe protein calorie malnutrition: Dietary referral.  #Mild hyperkalemia likely contributed by hyperglycemia: Serum potassium level improved.  PT OT evaluation. Patient wants to go home on discharge. Case manager consult for arranging home hospice care on discharge. Patient will need PT OT and home care services.  DVT  prophylaxis: Lovenox subcutaneous Code Status: DNR/DNI Family Communication: Discussed with the patient's husband at bedside at length, spent about 25 minutes only. At bedside Disposition Plan: Currently admitted    Consultants:   Palliative care  Procedures: CT scan chest Antimicrobials: Azithromycin started on September 2  Subjective: Seen and examined at bedside. No significant improvement in shortness of breath and cough. Denied nausea, vomiting, chest pain. Feels weak.husband at bedside. Objective: Vitals:   06/04/17 2012 06/04/17 2048 06/05/17 0611 06/05/17 0715  BP:  (!) 130/58 130/62   Pulse:  (!) 105 87   Resp:  16 20   Temp:  98 F (36.7 C) 97.6 F (36.4 C)   TempSrc:  Oral Oral   SpO2: 96% 93% 95% 96%  Weight:   51.7 kg (114 lb)   Height:        Intake/Output Summary (Last 24 hours) at 06/05/17 1202 Last data filed at 06/05/17 0900  Gross per 24 hour  Intake             1310 ml  Output             1600 ml  Net             -290 ml   Filed Weights   06/03/17 0657 06/04/17 0700 06/05/17 5956  Weight: 52 kg (114 lb 9.6 oz) 51.9 kg (114 lb 8 oz) 51.7 kg (114 lb)    Examination:  General exam:Sitting on bed, not in distress Respiratory system: Bilateral decreased breath sound, minimal wheeze Cardiovascular system: Regular rate rhythm, S1-S2 normal. No pedal edema Gastrointestinal system: Abdomen is nondistended, soft and nontender. Normal bowel sounds heard. Central nervous system: Alert and oriented. No focal neurological deficits.  Skin: No rashes, lesions or ulcers Psychiatry: Judgement and insight appear normal. Mood & affect appropriate.     Data Reviewed: I have personally reviewed following labs and imaging studies  CBC:  Recent Labs Lab 06/02/17 1203 06/03/17 0620  WBC 12.0* 7.9  NEUTROABS 8.4*  --   HGB 12.3 11.6*  HCT 40.3 37.5  MCV 103.9* 102.2*  PLT 258 160   Basic Metabolic Panel:  Recent Labs Lab 06/02/17 1230 06/03/17 0620  06/04/17 0547 06/05/17 0406  NA 142 141 137 139  K 4.6 5.0 5.4* 3.5  CL 95* 94* 94* 90*  CO2 36* 38* 33* 39*  GLUCOSE 237* 263* 339* 228*  BUN 16 16 21* 22*  CREATININE 0.66 0.65 0.68 0.73  CALCIUM 9.0 9.3 8.4* 8.4*   GFR: Estimated Creatinine Clearance: 47.3 mL/min (by C-G formula based on SCr of 0.73 mg/dL). Liver Function Tests:  Recent Labs Lab 06/02/17 1230  AST 17  ALT 15  ALKPHOS 64  BILITOT 0.7  PROT 6.5  ALBUMIN 3.3*   No results for input(s): LIPASE, AMYLASE in the last 168 hours. No results for input(s): AMMONIA in the last 168 hours. Coagulation Profile: No results for input(s): INR, PROTIME in the last 168 hours. Cardiac Enzymes:  Recent Labs Lab 06/02/17 1818  TROPONINI <0.03   BNP (last 3 results) No results for input(s): PROBNP in the last 8760 hours. HbA1C:  Recent Labs  06/02/17 1458  HGBA1C 7.4*   CBG:  Recent Labs Lab 06/04/17 1158 06/04/17 1628 06/04/17 1954 06/05/17 0730 06/05/17 1146  GLUCAP 274* 228* 155* 272* 237*   Lipid Profile: No results for input(s): CHOL, HDL, LDLCALC, TRIG, CHOLHDL, LDLDIRECT in the last 72 hours. Thyroid Function Tests: No results for input(s): TSH, T4TOTAL, FREET4, T3FREE, THYROIDAB in the last 72 hours. Anemia Panel: No results for input(s): VITAMINB12, FOLATE, FERRITIN, TIBC, IRON, RETICCTPCT in the last 72 hours. Sepsis Labs: No results for input(s): PROCALCITON, LATICACIDVEN in the last 168 hours.  No results found for this or any previous visit (from the past 240 hour(s)).       Radiology Studies: No results found.      Scheduled Meds: . aspirin EC  81 mg Oral QHS  . cholecalciferol  2,000 Units Oral Daily  . clonazePAM  1 mg Oral QHS  . enoxaparin (LOVENOX) injection  40 mg Subcutaneous Q24H  . feeding supplement (GLUCERNA SHAKE)  237 mL Oral BID BM  . fluticasone  2 spray Each Nare Daily  . furosemide  40 mg Oral Daily  . guaiFENesin  600 mg Oral BID  . insulin aspart  0-5  Units Subcutaneous QHS  . insulin aspart  0-9 Units Subcutaneous TID WC  . insulin aspart  7 Units Subcutaneous TID WC  . [START ON 06/06/2017] insulin glargine  20 Units Subcutaneous Daily  . ipratropium-albuterol  3 mL Nebulization TID  . loratadine  10 mg Oral Daily  . methylPREDNISolone (SOLU-MEDROL) injection  80 mg Intravenous Q12H  . mometasone-formoterol  2 puff Inhalation BID  . pantoprazole  40 mg Oral BID AC   Continuous Infusions: . azithromycin 500 mg (06/05/17 1126)  . methocarbamol (ROBAXIN)  IV 500 mg (06/05/17 0549)     LOS: 3 days    Is[place] Syler Tanna Furry, MD [facility] Pager [phone]  If 7PM-7AM, please contact night-coverage [email or web address] Password [email or web address] 06/05/2017, 12:02 PM

## 2017-06-05 NOTE — Clinical Social Work Note (Addendum)
Clinical Social Work Assessment  Patient Details  Name: [name] MRN: [id number] Date of Birth: 1941/02/22  Date of referral:  06/05/17               Reason for consult:  Discharge Planning, Facility Placement                Permission sought to share information with:  Family Supports Permission granted to share information::  Yes, Verbal Permission Granted  Name::     Marai Teehan  Agency::     Relationship::  spouse  Contact Information:  [phone]  Housing/Transportation Living arrangements for the past 2 months:  Single Family Home Source of Information:  Patient Patient Interpreter Needed:  None Criminal Activity/Legal Involvement Pertinent to Current Situation/Hospitalization:  No - Comment as needed Significant Relationships:  Adult Children, Other Family Members Lives with:  Spouse, Self Do you feel safe going back to the place where you live?  Yes Need for family participation in patient care:  No (Coment)  Care giving concerns: Patient had friend at bedside with her. Patient has a lot of family support from adult children and spouse  Social Worker assessment / plan:  Holiday representative met patient at bedside to offer support and discharge need. Patient stated she is not interested in discharge to a SNF. Patient stated she lives at home with spouse and adult children are within a 10 miles radius of patient. Patient stated she has a lot of people that can stay with her. Patient stated she would like Home Health to follow her at home. CSW signing off as patient no longer has social work needs Employment status:  Retired Nurse, adult PT Recommendations:  [place] / Referral to community resources:  [place]  Patient/Family's Response to care:  Patient appreciate CSW role in care  Patient/Family's Understanding of and Emotional Response to Diagnosis, Current Treatment, and Prognosis:  Patient has good understanding of recent hospitalization   Emotional Assessment Appearance:  Appears stated age Attitude/Demeanor/Rapport:  Other Affect (typically observed):  Pleasant, Appropriate, Happy Orientation:  Oriented to  Time, Oriented to Place, Oriented to Situation, Oriented to Self Alcohol / Substance use:  Not Applicable Psych involvement (Current and /or in the community):  No (Comment)  Discharge Needs  Concerns to be addressed:  No discharge needs identified Readmission within the last 30 days:  No Current discharge risk:  None Barriers to Discharge:  No Barriers Identified   Wende Neighbors, LCSW 06/05/2017, 1:49 PM

## 2017-06-05 NOTE — Progress Notes (Signed)
Pt is alert and oriented with with 02 n/c, Vital stable Receiving IV steroids, Blood sugars high uncontrolled, diabetes, Concerned about stages on COPD and new found nodule on lung. Plan for Steriod weaning along with Diabetes coordination.

## 2017-06-05 NOTE — Progress Notes (Signed)
Inpatient Diabetes Program Recommendations  AACE/ADA: New Consensus Statement on Inpatient Glycemic Control (2015)  Target Ranges:  Prepandial:   less than 140 mg/dL      Peak postprandial:   less than 180 mg/dL (1-2 hours)      Critically ill patients:  140 - 180 mg/dL   Lab Results  Component Value Date   GLUCAP 272 (H) 06/05/2017   HGBA1C 7.4 (H) 06/02/2017    Review of Glycemic Control Results for [name], [name] (MRN [id number]) as of 06/05/2017 07:53  Ref. Range 06/04/2017 07:54 06/04/2017 11:58 06/04/2017 16:28 06/04/2017 19:54 06/05/2017 07:30  Glucose-Capillary Latest Ref Range: 65 - 99 mg/dL 324 (H) 274 (H) 228 (H) 155 (H) 272 (H)   Diabetes history: DM2 Outpatient Diabetes medications: Metformin 500 mg am + 1 gm pm Current orders for Inpatient glycemic control: Lantus 15 units + Novolog correction 0-9 units tid + 0-5 units hs  Inpatient Diabetes Program Recommendations:    While on steroids, Please consider increase in Novolog correction to moderate tid + 0-5 units hs.  Thank you, Nani Gasser. Rmani Kapusta, RN, MSN, CDE  Diabetes Coordinator Inpatient Glycemic Control Team Team Pager [phone] (8am-5pm) 06/05/2017 7:54 AM

## 2017-06-05 NOTE — Plan of Care (Signed)
Problem: Activity: Goal: Risk for activity intolerance will decrease Outcome: Progressing Walking in Bedroom and bathroom, Plan to walk in the hall.

## 2017-06-06 ENCOUNTER — Ambulatory Visit: Payer: Medicare Other | Admitting: Internal Medicine

## 2017-06-06 DIAGNOSIS — I10 Essential (primary) hypertension: Secondary | ICD-10-CM

## 2017-06-06 LAB — GLUCOSE, CAPILLARY
GLUCOSE-CAPILLARY: 519 mg/dL — AB (ref 65–99)
GLUCOSE-CAPILLARY: 143 mg/dL — AB (ref 65–99)
Glucose-Capillary: 237 mg/dL — ABNORMAL HIGH (ref 65–99)
Glucose-Capillary: 192 mg/dL — ABNORMAL HIGH (ref 65–99)
Glucose-Capillary: 192 mg/dL — ABNORMAL HIGH (ref 65–99)

## 2017-06-06 MED ORDER — IPRATROPIUM-ALBUTEROL 0.5-2.5 (3) MG/3ML IN SOLN
3.0000 mL | Freq: Two times a day (BID) | RESPIRATORY_TRACT | Status: DC
  Administered 2017-06-06 – 2017-06-07 (×2): 3 mL via RESPIRATORY_TRACT
  Filled 2017-06-06 (×2): qty 3

## 2017-06-06 MED ORDER — AZITHROMYCIN 500 MG PO TABS
500.0000 mg | ORAL_TABLET | Freq: Every day | ORAL | Status: DC
  Administered 2017-06-07: 500 mg via ORAL
  Filled 2017-06-06: qty 1

## 2017-06-06 MED ORDER — MORPHINE SULFATE (CONCENTRATE) 10 MG/0.5ML PO SOLN: 2.5000 mg | ORAL | Status: DC | PRN

## 2017-06-06 MED ORDER — ALBUTEROL SULFATE (2.5 MG/3ML) 0.083% IN NEBU: 2.5000 mg | INHALATION_SOLUTION | RESPIRATORY_TRACT | Status: DC | PRN

## 2017-06-06 MED ORDER — MORPHINE SULFATE (PF) 2 MG/ML IV SOLN
1.0000 mg | Freq: Once | INTRAVENOUS | Status: AC
  Administered 2017-06-06: 1 mg via INTRAVENOUS
  Filled 2017-06-06: qty 1

## 2017-06-06 NOTE — Progress Notes (Signed)
[facility]ospice and [facility]) Hospital Liaison:  RN visit   Notified by Olga Coaster, CMRN, of patient/family request for [facility] services at home after discharge. Chart and patient information under review by [facility] physician. Hospice eligibility pending at this time. Writer spoke with patient at bedside to initiate education related to hospice philosophy, services and team approach to care.  Patient verbalized understanding of information given.  Per discussion, plan is for discharge to home by [place] on 06/07/17.  Please send signed and completed DNR form home with patient/family.  Patient will need prescriptions for discharge comfort medications. DME needs have been discussed, patient currently has the following equipment in the home:  Walker, O2, .  Patient/family requests the following DME for delivery to the home: none.   [facility] aware of the above.  Completed discharge summary will need to be faxed to [facility] at [phone] when final.  Please notify [facility] when patient is ready to leave the unit at discharge. (Call [phone] or [phone] after 5pm.)  [facility] information and contact numbers given to the patient during this visit.  Above information shared with Olga Coaster, CMRN.  Please call with any hospice related questions.  Thank you for this referral.  Farrel Gordon, RN, [facility] Liaison [phone] liaisons are now on [place].

## 2017-06-06 NOTE — Progress Notes (Signed)
Occupational Therapy Treatment Patient Details Name: [name] MRN: [id number] DOB: 03/05/1941 Today's Date: 06/06/2017    History of present illness 76 yo female with onset of respiratory crisis, SOB and having worsening of breathing since a recent cold; now hypoxic and very tachycardic. PMHx:  COPD, CAD, HTN, DM, MI and URI's   OT comments  Pt progressing toward OT goals. She demonstrates improving activity tolerance for ADL and was able to complete approximately 15 minutes of standing activity with multiple standing rather than seated rest breaks. SpO2 during grooming tasks at sink 92-96% on 2L O2 via [place]. Pt with desaturation to 88% on 2L O2 via [place] following toileting tasks once returned to recliner and recovered to 90% with pursed lip breathing techniques. Educated pt and husband on need for UE support for safety during functional mobility as well as recommendation for 24 hour assistance as pt s planning to return home rather than D/C to SNF. Will continue to follow while admitted.    Follow Up Recommendations  Supervision/Assistance - 24 hour;Home health OT    Equipment Recommendations  3 in 1 bedside commode    Recommendations for Other Services      Precautions / Restrictions Precautions Precautions: Fall Precaution Comments: Watch O2 sats Restrictions Weight Bearing Restrictions: No       Mobility Bed Mobility Overal bed mobility: Modified Independent             General bed mobility comments: using bedrail and HOB elevated due to SOB  Transfers Overall transfer level: Needs assistance Equipment used: 1 person hand held assist Transfers: Sit to/from Stand Sit to Stand: Supervision         General transfer comment: Supervision for sit<>stand. Min guard assist once ambulating.     Balance Overall balance assessment: Needs assistance Sitting-balance support: Feet supported Sitting balance-Leahy Scale: Fair     Standing balance support: Single  extremity supported;Bilateral upper extremity supported;During functional activity Standing balance-Leahy Scale: Fair Standing balance comment: Holding to furniture during mobility.                            ADL either performed or assessed with clinical judgement   ADL Overall ADL's : Needs assistance/impaired     Grooming: Supervision/safety;Standing                   Toilet Transfer: Min guard;Ambulation;Grab bars Toilet Transfer Details (indicate cue type and reason): Holding on to furniture while ambulating. LOB x1 requiring assistance to maintain balance.  Toileting- Clothing Manipulation and Hygiene: Supervision/safety       Functional mobility during ADLs: Min guard General ADL Comments: Pt on 2L O2 via [place] throughout entirity of session. Pt with decreased activity tolerance for ADL and able to recover from fatigue with standing rest breaks. Desaturation to 88% on return to chair following approximately 15 minutes of standing ADL tasks and toilet transfer. Recovered with pursed lip breathing techniques to 90%. During standing tasks SpO2 ranging from 92-96%.     Vision   Vision Assessment?: No apparent visual deficits   Perception     Praxis      Cognition Arousal/Alertness: Awake/alert Behavior During Therapy: WFL for tasks assessed/performed Overall Cognitive Status: Within Functional Limits for tasks assessed  Exercises     Shoulder Instructions       General Comments See ADL section for vitals details.    Pertinent Vitals/ Pain       Pain Assessment: No/denies pain  Home Living                                          Prior Functioning/Environment              Frequency  Min 2X/week        Progress Toward Goals  OT Goals(current goals can now be found in the care plan section)  Progress towards OT goals: Progressing toward goals  Acute Rehab OT  Goals Patient Stated Goal: to feel stronger and get home OT Goal Formulation: With patient Time For Goal Achievement: 06/17/17 Potential to Achieve Goals: Good  Plan Discharge plan remains appropriate    Co-evaluation                 AM-PAC PT "6 Clicks" Daily Activity     Outcome Measure   Help from another person eating meals?: A Little Help from another person taking care of personal grooming?: A Little Help from another person toileting, which includes using toliet, bedpan, or urinal?: A Little Help from another person bathing (including washing, rinsing, drying)?: A Little Help from another person to put on and taking off regular upper body clothing?: A Little Help from another person to put on and taking off regular lower body clothing?: A Little 6 Click Score: 18    End of Session    OT Visit Diagnosis: Unsteadiness on feet (R26.81);Muscle weakness (generalized) (M62.81)   Activity Tolerance Patient tolerated treatment well   Patient Left in bed;with call bell/phone within reach;with SCD's reapplied   Nurse Communication          Time: 1308-6578 OT Time Calculation (min): 31 min  Charges: OT General Charges $OT Visit: 1 Visit OT Treatments $Self Care/Home Management : 23-37 mins  Norman Herrlich, MS OTR/L  Pager: [place] 06/06/2017, 10:45 AM

## 2017-06-06 NOTE — Progress Notes (Addendum)
PROGRESS NOTE    [name]  DEY:[id number] DOB: 03-07-1941 DOA: 06/02/2017 PCP: Glend[place] Chard, MD   Brief Narrative: 76 year old female with increased his COPD on home oxygen, CAD, diabetes, hypertension presented with worsening shortness of breath, cough, acute on chronic respiratory failure with hypoxia.  Assessment & Plan:  # Acute on chronic respiratory failure with hypoxia and hypercapnia due to acute COPD exacerbation: -Patient required BiPAP on admission. Currently on 2-3 L of oxygen.  CT scan of chest obtained which was consistent with severe COPD changes and 7 mm lung nodule which require outpatient follow-up. I reviewed the imaging test result and nodal with the patient and her husband at bedside. I discussed about end stage COPD and nature of disease. Palliative care is following. Plan for home with hospice on discharge. -Patient still has some shortness of breath and difficulty completing long sentences. Plan to continue IV Solu-Medrol, azithromycin and bronchodilators today. Plan for ambulation today. I ordered a dose of IV morphine today and start oral morphine liquid 2.5 mg as needed to see if it helps her dyspnea. - Recommended to follow-up with pulmonologist for lung nodule. -Patient does not smoke currently.  #Hypertension: Monitor blood pressure. Currently on Lasix oral.  #Type 2 diabetes with hyperglycemia  in the setting of steroid: Currently on higher dose of insulin because of hyperglycemia management. Seems like current regimen is controlling blood sugar. Plan to continue for now.  Continue sliding scale. A1c of 7.4 .  #Anxiety depression: Continue home medication. Mood is stable.  #Severe protein calorie malnutrition: Dietary referral.  #Mild hyperkalemia likely contributed by hyperglycemia: Serum potassium level improved.  PT OT evaluation. Patient wants to go home on discharge. Case manager consult for arranging home hospice care on discharge. Patient  will need PT OT and home care services.  DVT prophylaxis: Lovenox subcutaneous Code Status: DNR/DNI Family Communication: I had a long discussion with the patient and her husband at bedside. I spent about 25 minutes only during bedside discussion. Disposition Plan: Currently admitted    Consultants:   Palliative care  Procedures: CT scan chest Antimicrobials: Azithromycin started on September 2  Subjective: Seen and examined at bedside. Gradual improvement but is still having mild shortness of breath. Denied chest pain. Mild tremor with nebulization. No nausea vomiting. Objective: Vitals:   06/06/17 0800 06/06/17 0929 06/06/17 1215 06/06/17 1323  BP: 137/75  (!) 144/72 (!) 144/63  Pulse: 68  (!) 103 98  Resp: 14  17 16   Temp: 99.1 F (37.3 C)  97.9 F (36.6 C) 97.7 F (36.5 C)  TempSrc: Oral  Oral Oral  SpO2: 99% 95% 96% 95%  Weight:      Height:        Intake/Output Summary (Last 24 hours) at 06/06/17 1435 Last data filed at 06/06/17 1330  Gross per 24 hour  Intake              720 ml  Output             2000 ml  Net            -1280 ml   Filed Weights   06/04/17 0700 06/05/17 0611 06/06/17 0524  Weight: 51.9 kg (114 lb 8 oz) 51.7 kg (114 lb) 51.9 kg (114 lb 6.4 oz)    Examination:  General exam:Sitting on chair, difficulty completing long sentences because of shortness of breath. Respiratory system: Mild bilateral decreased breath sound gradually improving. Cardiovascular system: Regular rate rhythm, S1 and S2  normal. No pedal edema. Gastrointestinal system: Abdomen is nondistended, soft and nontender. Normal bowel sounds heard. Central nervous system: Alert and oriented. No focal neurological deficits. Skin: No rashes, lesions or ulcers Psychiatry: Judgement and insight appear normal. Mood & affect appropriate.     Data Reviewed: I have personally reviewed following labs and imaging studies  CBC:  Recent Labs Lab 06/02/17 1203 06/03/17 0620  WBC  12.0* 7.9  NEUTROABS 8.4*  --   HGB 12.3 11.6*  HCT 40.3 37.5  MCV 103.9* 102.2*  PLT 258 211   Basic Metabolic Panel:  Recent Labs Lab 06/02/17 1230 06/03/17 0620 06/04/17 0547 06/05/17 0406  NA 142 141 137 139  K 4.6 5.0 5.4* 3.5  CL 95* 94* 94* 90*  CO2 36* 38* 33* 39*  GLUCOSE 237* 263* 339* 228*  BUN 16 16 21* 22*  CREATININE 0.66 0.65 0.68 0.73  CALCIUM 9.0 9.3 8.4* 8.4*   GFR: Estimated Creatinine Clearance: 47.3 mL/min (by C-G formula based on SCr of 0.73 mg/dL). Liver Function Tests:  Recent Labs Lab 06/02/17 1230  AST 17  ALT 15  ALKPHOS 64  BILITOT 0.7  PROT 6.5  ALBUMIN 3.3*   No results for input(s): LIPASE, AMYLASE in the last 168 hours. No results for input(s): AMMONIA in the last 168 hours. Coagulation Profile: No results for input(s): INR, PROTIME in the last 168 hours. Cardiac Enzymes:  Recent Labs Lab 06/02/17 1818  TROPONINI <0.03   BNP (last 3 results) No results for input(s): PROBNP in the last 8760 hours. HbA1C: No results for input(s): HGBA1C in the last 72 hours. CBG:  Recent Labs Lab 06/05/17 1146 06/05/17 1700 06/05/17 2114 06/06/17 0759 06/06/17 1222  GLUCAP 237* 162* 307* 237* 192*   Lipid Profile: No results for input(s): CHOL, HDL, LDLCALC, TRIG, CHOLHDL, LDLDIRECT in the last 72 hours. Thyroid Function Tests: No results for input(s): TSH, T4TOTAL, FREET4, T3FREE, THYROIDAB in the last 72 hours. Anemia Panel: No results for input(s): VITAMINB12, FOLATE, FERRITIN, TIBC, IRON, RETICCTPCT in the last 72 hours. Sepsis Labs: No results for input(s): PROCALCITON, LATICACIDVEN in the last 168 hours.  No results found for this or any previous visit (from the past 240 hour(s)).       Radiology Studies: No results found.      Scheduled Meds: . aspirin EC  81 mg Oral QHS  . [START ON 06/07/2017] azithromycin  500 mg Oral Daily  . cholecalciferol  2,000 Units Oral Daily  . clonazePAM  1 mg Oral QHS  .  enoxaparin (LOVENOX) injection  40 mg Subcutaneous Q24H  . feeding supplement (GLUCERNA SHAKE)  237 mL Oral BID BM  . fluticasone  2 spray Each Nare Daily  . furosemide  40 mg Oral Daily  . guaiFENesin  600 mg Oral BID  . insulin aspart  0-5 Units Subcutaneous QHS  . insulin aspart  0-9 Units Subcutaneous TID WC  . insulin aspart  7 Units Subcutaneous TID WC  . insulin glargine  20 Units Subcutaneous Daily  . ipratropium-albuterol  3 mL Nebulization BID  . loratadine  10 mg Oral Daily  . methocarbamol  500 mg Oral TID  . methylPREDNISolone (SOLU-MEDROL) injection  80 mg Intravenous Q12H  . mometasone-formoterol  2 puff Inhalation BID  . pantoprazole  40 mg Oral BID AC   Continuous Infusions:    LOS: 4 days    [place] Tanna Furry, MD [facility] Pager [phone]  If 7PM-7AM, please contact night-coverage [email or web address] Password [email or web address]  06/06/2017, 2:35 PM

## 2017-06-06 NOTE — Progress Notes (Signed)
Physical Therapy Treatment Patient Details Name: [name] MRN: [id number] DOB: 04-05-1941 Today's Date: 06/06/2017    History of Present Illness 76 yo female with onset of respiratory crisis, SOB and having worsening of breathing since a recent cold; now hypoxic and very tachycardic. PMHx:  COPD, CAD, HTN, DM, MI and URI's    PT Comments    Patient is making progress toward mobility goals. Pt tolerated increased gait distance and stair training with min guard/supervision and seated rest break due to 3/4 DOE. Pt requires UE support for OOB mobility and encouraged to use rollator upon d/c. SpO2 84-93% on 2-3L O2 via [place]. Pt demonstrated pursed lip breathing technique without cues. Pt will continue to benefit from further skilled PT services to maximize independence and safety with mobility.    Follow Up Recommendations  Home health PT;Supervision for mobility/OOB     Equipment Recommendations  None recommended by PT    Recommendations for Other Services       Precautions / Restrictions Precautions Precautions: Fall Precaution Comments: Watch O2 sats Restrictions Weight Bearing Restrictions: No    Mobility  Bed Mobility Overal bed mobility: Modified Independent             General bed mobility comments: pt sitting OOB in chair upon arrival  Transfers Overall transfer level: Needs assistance Equipment used: None Transfers: Sit to/from Stand Sit to Stand: Supervision         General transfer comment: supervision to stand for safety; pt using rollator upon standing for support  Ambulation/Gait Ambulation/Gait assistance: Min guard;Supervision Ambulation Distance (Feet): 250 Feet Assistive device:  (rollator) Gait Pattern/deviations: Step-through pattern;Decreased stride length Gait velocity: decreased   General Gait Details: slow, steady cadence   Stairs Stairs: Yes   Stair Management: Two rails;Step to pattern;Forwards Number of Stairs: 10 General  stair comments: cues for safety and step to pattern; seated rest break upon descending stairs due to 3/4 DOE; SpO2 desat to 84% on 3L O2 with stair negotiation but up to 92% on 3L O2 at rest with pursed lip breathing  Wheelchair Mobility    Modified Rankin (Stroke Patients Only)       Balance Overall balance assessment: Needs assistance Sitting-balance support: Feet supported Sitting balance-Leahy Scale: Good     Standing balance support: Single extremity supported;Bilateral upper extremity supported;During functional activity Standing balance-Leahy Scale: Poor Standing balance comment: pt requires at lease single UE support for dynamic activities                            Cognition Arousal/Alertness: Awake/alert Behavior During Therapy: WFL for tasks assessed/performed Overall Cognitive Status: Within Functional Limits for tasks assessed                                        Exercises      General Comments General comments (skin integrity, edema, etc.): pt on 2-4L O2 via [place] throughout session and with SpO2 84-94%      Pertinent Vitals/Pain Pain Assessment: No/denies pain    Home Living                      Prior Function            PT Goals (current goals can now be found in the care plan section) Acute Rehab PT Goals Patient Stated Goal:  to feel stronger and get home PT Goal Formulation: With patient Time For Goal Achievement: 06/17/17 Potential to Achieve Goals: Good Progress towards PT goals: Progressing toward goals    Frequency    Min 3X/week      PT Plan Discharge plan needs to be updated    Co-evaluation              AM-PAC PT "6 Clicks" Daily Activity  Outcome Measure  Difficulty turning over in bed (including adjusting bedclothes, sheets and blankets)?: A Little Difficulty moving from lying on back to sitting on the side of the bed? : A Little Difficulty sitting down on and standing up from a  chair with arms (e.g., wheelchair, bedside commode, etc,.)?: A Little Help needed moving to and from a bed to chair (including a wheelchair)?: A Little Help needed walking in hospital room?: A Little Help needed climbing 3-5 steps with a railing? : A Little 6 Click Score: 18    End of Session Equipment Utilized During Treatment: Oxygen;Gait belt Activity Tolerance: Patient tolerated treatment well Patient left: with call bell/phone within reach;with family/visitor present;in bed;Other (comment) (sitting EOB) Nurse Communication: Mobility status PT Visit Diagnosis: Other abnormalities of gait and mobility (R26.89);Muscle weakness (generalized) (M62.81);Other (comment) (desaturation of O2 with effort)     Time: 7414-2395 PT Time Calculation (min) (ACUTE ONLY): 32 min  Charges:  $Gait Training: 8-22 mins $Therapeutic Activity: 8-22 mins                    G Codes:       Earney Navy, PTA Pager: [phone]     Darliss Cheney 06/06/2017, 1:17 PM

## 2017-06-06 NOTE — Care Management Important Message (Signed)
Important Message  Patient Details  Name: [name] MRN: [id number] Date of Birth: 08/08/1941   Medicare Important Message Given:  Yes    Vickie Ponds [address] 06/06/2017, 3:11 PM

## 2017-06-06 NOTE — Care Management Note (Signed)
Case Management Note  Patient Details  Name: BERENIS CORTER MRN: [id number] Date of Birth: Dec 20, 1940  Subjective/Objective: Acute on chronic Resp Failure                 Action/Plan: Patient lives at home with spouse; PCP: Glendale Chard, MD; has private insurance with [facility]; CM talked to patient about home hospice choice; patient/ spouse chose [facility] [place]; Referral placed as requested.  Expected Discharge Date:     Possibly 06/09/2017             Expected Discharge Plan:  [place]  Discharge planning Services  CM Consult  Choice offered to:  Patient, Spouse  [facility] Agency:  [facility]  Status of Service:  In process, will continue to follow  Sherrilyn Rist [phone] 06/06/2017, 11:02 AM

## 2017-06-06 NOTE — Progress Notes (Signed)
Pt slept very well overnight, no any specific complain of chest pain and SOB noted, during the starting of the shift pt ambulated in a hallway with oxygen @2l /min, it is 2l/min continue, IV solumedrol continue, will continue to monitor the patient

## 2017-06-07 DIAGNOSIS — F17201 Nicotine dependence, unspecified, in remission: Secondary | ICD-10-CM

## 2017-06-07 DIAGNOSIS — F329 Major depressive disorder, single episode, unspecified: Secondary | ICD-10-CM

## 2017-06-07 LAB — GLUCOSE, CAPILLARY
Glucose-Capillary: 267 mg/dL — ABNORMAL HIGH (ref 65–99)
Glucose-Capillary: 76 mg/dL (ref 65–99)

## 2017-06-07 MED ORDER — PANTOPRAZOLE SODIUM 40 MG PO TBEC
40.0000 mg | DELAYED_RELEASE_TABLET | Freq: Two times a day (BID) | ORAL | Status: DC
  Administered 2017-06-07: 40 mg via ORAL
  Filled 2017-06-07: qty 1

## 2017-06-07 MED ORDER — MORPHINE SULFATE (CONCENTRATE) 10 MG/0.5ML PO SOLN: 2.5000 mg | Freq: Four times a day (QID) | ORAL | 0 refills | Status: AC | PRN

## 2017-06-07 MED ORDER — PANTOPRAZOLE SODIUM 40 MG PO TBEC: 40.0000 mg | DELAYED_RELEASE_TABLET | Freq: Two times a day (BID) | ORAL | 1 refills | Status: AC

## 2017-06-07 MED ORDER — INSULIN ASPART 100 UNIT/ML ~~LOC~~ SOLN
7.0000 [IU] | Freq: Three times a day (TID) | SUBCUTANEOUS | Status: DC
  Administered 2017-06-07: 7 [IU] via SUBCUTANEOUS

## 2017-06-07 MED ORDER — PREDNISONE 10 MG PO TABS: ORAL_TABLET | ORAL | 1 refills | Status: DC

## 2017-06-07 MED ORDER — AZITHROMYCIN 500 MG PO TABS: 500.0000 mg | ORAL_TABLET | Freq: Every day | ORAL | 0 refills | Status: DC

## 2017-06-07 MED ORDER — FUROSEMIDE 20 MG PO TABS: 20.0000 mg | ORAL_TABLET | Freq: Every day | ORAL | 1 refills | Status: AC

## 2017-06-07 MED ORDER — FLUTICASONE PROPIONATE 50 MCG/ACT NA SUSP: 2.0000 | Freq: Every day | NASAL | Status: AC | PRN

## 2017-06-07 NOTE — Discharge Summary (Addendum)
Physician Discharge Summary  [name]:[id number] DOB: 1941/02/21 DOA: 06/02/2017  PCP: Glendale Chard, MD  Admit date: 06/02/2017 Discharge date: 06/07/2017  Time spent: 35 minutes  Recommendations for Outpatient Follow-up:  Patient discharge home with hospice follow up Plan is to complete steroid tapering and antibiotics; subsequent transition to symptomatic management and comfort care to be pursuit as needed No further hospitalization  Discharge Diagnoses:  Principal Problem:   Acute on chronic respiratory failure (HCC) Active Problems:   Tobacco use disorder, severe, in sustained remission   Essential hypertension   Coronary atherosclerosis   COPD exacerbation ([place])   DM2 (diabetes mellitus, type 2) ([place])   Depression   Palliative care by specialist   Malnutrition of moderate degree   Discharge Condition: stable and with improved breathing. Discharge home with home hospice.  Diet recommendation: comfort feeding ; monitoring sodium intake to prevent excessive fluid overload.  Filed Weights   06/05/17 0611 06/06/17 0524 06/07/17 0500  Weight: 51.7 kg (114 lb) 51.9 kg (114 lb 6.4 oz) 51.7 kg (114 lb)    History of present illness:  76 year old female with increased his COPD on home oxygen, CAD, diabetes, hypertension presented with worsening shortness of breath, cough, acute on chronic respiratory failure with hypoxia.  Hospital Course:  1-acute on chronic resp failure with hypoxia: due to exacerbation of end stage COPD -improved with nebulizer therapy, steroids and abx's -work of breathing and air hunger also significantly palliated with use of morphine -patient stable to discharge home with hospice follow up -continue oxygen supplementation  2-lung nodule -at this time patient has decided no interest in further work up -will follow actively with hospice service   3-HTN -stable overall -will continue current medication regimen -slowly weaned off and clean  medication list as moving more towards symptomatic management only  4-GERD -will continue PPI  5-anxiety -continue klonopin   6-type 2 diabetes -will continue metformin -slowly weaned off and clean medication list as moving more towards symptomatic management only  7-constipation -will use senokot and OTC miralax for bowel regimen   8-protein calorie malnutrition, Moderate -feeding supplements and diet as tolerated -main goal is symptom management   Procedures:  See below for x-ray reports   Consultations:  Palliative care  Discharge Exam: Vitals:   06/07/17 0854 06/07/17 1122  BP:  (!) 147/75  Pulse: 92 95  Resp: 18 16  Temp:  97.9 F (36.6 C)  SpO2: 96% 95%   General exam:afebrile, no CP, improved air movement and looking to go home with family. Respiratory system: good air movement bilaterally, minimal end exp wheezing, no crackles. Normal resp effort. Cardiovascular system: Regular rate rhythm, S1-S2 normal. No pedal edema Gastrointestinal system: Abdomen is nondistended, soft and nontender. Normal bowel sounds heard. Central nervous system: Alert and oriented. No focal neurological deficits. Skin: No rashes, lesions or ulcers Psychiatry: Judgement and insight appear normal. Mood & affect appropriate.    Discharge Instructions   Discharge Instructions    Increase activity slowly    Complete by:  As directed      Current Discharge Medication List    START taking these medications   Details  azithromycin (ZITHROMAX) 500 MG tablet Take 1 tablet (500 mg total) by mouth daily. Qty: 4 tablet, Refills: 0    Morphine Sulfate (MORPHINE CONCENTRATE) 10 MG/0.5ML SOLN concentrated solution Take 0.13 mLs (2.6 mg total) by mouth every 6 (six) hours as needed for shortness of breath (for dyspnea.). Qty: 30 mL, Refills: 0  CONTINUE these medications which have CHANGED   Details  fluticasone (FLONASE) 50 MCG/ACT nasal spray Place 2 sprays into both nostrils  daily as needed for allergies or rhinitis.    furosemide (LASIX) 20 MG tablet Take 1 tablet (20 mg total) by mouth daily. Qty: 30 tablet, Refills: 1    pantoprazole (PROTONIX) 40 MG tablet Take 1 tablet (40 mg total) by mouth 2 (two) times daily before a meal. Qty: 60 tablet, Refills: 1    predniSONE (DELTASONE) 10 MG tablet Take 5 tablets daily X 1 day; then 4 tablets by mouth daily X 2 days; then 3 tablets by mouth daily X2 days; then 2 tablets by mouth daily x 2 days; then start prednisone 10mg  daily. Qty: 50 tablet, Refills: 1      CONTINUE these medications which have NOT CHANGED   Details  albuterol (PROAIR HFA) 108 (90 Base) MCG/ACT inhaler Inhale 2 puffs into the lungs every 4 (four) hours as needed for wheezing or shortness of breath. Qty: 1 Inhaler, Refills: 11    albuterol (PROVENTIL) (2.5 MG/3ML) 0.083% nebulizer solution USE 1 VIAL IN NEBULIZER EVERY 6 HOURS FOR WHEEZING Qty: 75 mL, Refills: 5    amitriptyline (ELAVIL) 10 MG tablet 1 daily at bedtime Qty: 30 tablet, Refills: 0    Ascorbic Acid (VITAMIN C) 1000 MG tablet Take 1,000 mg by mouth daily.    aspirin EC 81 MG tablet Take 81 mg by mouth at bedtime.     Biotin (BIOTIN MAXIMUM STRENGTH) 10 MG TABS Take 10 mg by mouth daily.     Cholecalciferol (VITAMIN D) 2000 units tablet Take 2,000 Units by mouth daily.    clonazePAM (KLONOPIN) 1 MG tablet Take 1 tablet (1 mg total) by mouth at bedtime. Qty: 10 tablet, Refills: 0    famotidine (PEPCID) 20 MG tablet Take 1 tablet (20 mg total) by mouth daily. Qty: 30 tablet, Refills: 0    Fluticasone-Salmeterol (ADVAIR DISKUS) 250-50 MCG/DOSE AEPB Inhale 1 puff into the lungs 2 (two) times daily. Qty: 60 each, Refills: 5    gabapentin (NEURONTIN) 100 MG capsule Take 1 capsule (100 mg total) by mouth 3 (three) times daily as needed (pain). Qty: 90 capsule, Refills: 0    guaiFENesin (MUCINEX) 600 MG 12 hr tablet Take 1 tablet (600 mg total) by mouth 2 (two) times daily.     loratadine (CLARITIN) 10 MG tablet Take 1 tablet (10 mg total) by mouth daily. Qty: 30 tablet, Refills: 0    Melatonin 5 MG TABS Take 5 mg by mouth at bedtime as needed (for sleep).     metFORMIN (GLUCOPHAGE-XR) 500 MG 24 hr tablet Take 500-1,000 mg by mouth See admin instructions. 500 mg in the morning and 1,000 mg in the evening Refills: 0    Multiple Vitamin (MULTIVITAMIN WITH MINERALS) TABS tablet Take 1 tablet by mouth daily.    naproxen sodium (ALEVE) 220 MG tablet Take 220-440 mg by mouth 2 (two) times daily as needed (for pain).    nitroGLYCERIN (NITROSTAT) 0.4 MG SL tablet Place 1 tablet (0.4 mg total) under the tongue every 5 (five) minutes as needed for chest pain. Qty: 25 tablet, Refills: 6    ondansetron (ZOFRAN) 4 MG tablet Take 1 tablet (4 mg total) by mouth every 8 (eight) hours as needed for nausea or vomiting. Qty: 15 tablet, Refills: 0    OXYGEN Inhale 2.5-3 L into the lungs continuous.     senna-docusate (SENOKOT-S) 8.6-50 MG tablet Take 1 tablet  by mouth at bedtime as needed for mild constipation. Qty: 15 tablet, Refills: 0    SPIRIVA HANDIHALER 18 MCG inhalation capsule INHALE 1 CAPSULE DAILY Qty: 30 capsule, Refills: 8      STOP taking these medications     amoxicillin (AMOXIL) 500 MG tablet      ipratropium (ATROVENT) 0.02 % nebulizer solution        Allergies  Allergen Reactions  . Bactrim [Sulfamethoxazole-Trimethoprim] Nausea And Vomiting  . Codeine Nausea And Vomiting  . Vytorin [Ezetimibe-Simvastatin] Other (See Comments)    Malaise   Follow-up Information    Glendale Chard, MD. Schedule an appointment as soon as possible for a visit in 10 day(s).   Specialty:  Internal Medicine Why:  Message to call patient at home to schedule appt Contact information: [address] [place] [address] [phone]            The results of significant diagnostics from this hospitalization (including imaging, microbiology,  ancillary and laboratory) are listed below for reference.    Significant Diagnostic Studies: Ct Chest Wo Contrast  Result Date: 06/03/2017 CLINICAL DATA:  COPD exacerbation EXAM: CT CHEST WITHOUT CONTRAST TECHNIQUE: Multidetector CT imaging of the chest was performed following the standard protocol without IV contrast. COMPARISON:  06/02/2017 and 11/26/2016 FINDINGS: Cardiovascular: Limited without IV contrast. Atherosclerosis of the major branch vessels and the thoracic aorta. No significant aneurysm. No mediastinal hemorrhage or hematoma. Native coronary atherosclerosis noted. Normal heart size. No pericardial effusion Mediastinum/Nodes: No enlarged mediastinal or axillary lymph nodes. Thyroid gland, trachea, and esophagus demonstrate no significant findings. Lungs/Pleura: Extensive pulmonary emphysema with hyperlucency and hyperinflation. Scattered apical and small bibasilar and right middle lobe areas of atelectasis versus scarring. Right middle lobe subpleural and peripheral noncalcified nodule measures 7 mm, image 105 series 4. Mild central bronchial wall thickening without significant bronchiectasis or mucus plugging. No pleural abnormality, pleural effusion, or pneumothorax. Upper Abdomen: No acute abnormality. Musculoskeletal: No soft tissue abnormality or asymmetry. No acute osseous finding. No compression fracture. Intact sternum. IMPRESSION: Advanced pulmonary emphysema pattern with hyperlucency and hyperinflation. Associated mild chronic central bronchial wall thickening without occlusion or mucous plugging. Scattered parenchymal areas of subpleural atelectasis versus scarring No superimposed acute pneumonia, collapse, consolidation, or edema pattern 7 mm subpleural peripheral right middle lobe nodule. Non-contrast chest CT at 6-12 months is recommended. If the nodule is stable at time of repeat CT, then future CT at 18-24 months (from today's scan) is considered optional for low-risk patients, but  is recommended for high-risk patients. This recommendation follows the consensus statement: Guidelines for Management of Incidental Pulmonary Nodules Detected on CT Images: From the Fleischner Society 2017; Radiology 2017; 284:228-243. Aortic Atherosclerosis (ICD10-I70.0) and Emphysema (ICD10-J43.9). Electronically Signed   By: Jerilynn Mages.  Shick M.D.   On: 06/03/2017 11:51   Dg Chest Portable 1 View  Result Date: 06/02/2017 CLINICAL DATA:  Shortness of breath. EXAM: PORTABLE CHEST 1 VIEW COMPARISON:  Chest x-ray dated November 26, 2016. FINDINGS: The cardiomediastinal silhouette is normal in size. Normal pulmonary vascularity. Hyperinflated lungs with unchanged emphysematous changes in the bilateral upper lobes. Coarsened interstitial markings, particularly within the lower lobes, appear slightly more prominent when compared to prior study. No focal consolidation. No pleural effusion or pneumothorax. No acute osseous abnormality. IMPRESSION: COPD.  No active cardiopulmonary disease. Electronically Signed   By: Titus Dubin M.D.   On: 06/02/2017 12:29    Microbiology: No results found for this or any previous visit (from the past 240  hour(s)).   Labs: Basic Metabolic Panel:  Recent Labs Lab 06/02/17 1230 06/03/17 0620 06/04/17 0547 06/05/17 0406  NA 142 141 137 139  K 4.6 5.0 5.4* 3.5  CL 95* 94* 94* 90*  CO2 36* 38* 33* 39*  GLUCOSE 237* 263* 339* 228*  BUN 16 16 21* 22*  CREATININE 0.66 0.65 0.68 0.73  CALCIUM 9.0 9.3 8.4* 8.4*   Liver Function Tests:  Recent Labs Lab 06/02/17 1230  AST 17  ALT 15  ALKPHOS 64  BILITOT 0.7  PROT 6.5  ALBUMIN 3.3*   No results for input(s): LIPASE, AMYLASE in the last 168 hours. No results for input(s): AMMONIA in the last 168 hours. CBC:  Recent Labs Lab 06/02/17 1203 06/03/17 0620  WBC 12.0* 7.9  NEUTROABS 8.4*  --   HGB 12.3 11.6*  HCT 40.3 37.5  MCV 103.9* 102.2*  PLT 258 258   Cardiac Enzymes:  Recent Labs Lab 06/02/17 1818   TROPONINI <0.03   BNP: BNP (last 3 results)  Recent Labs  11/24/16 0301 06/02/17 1207  BNP 46.4 38.8    ProBNP (last 3 results) No results for input(s): PROBNP in the last 8760 hours.  CBG:  Recent Labs Lab 06/06/17 1222 06/06/17 1631 06/06/17 2117 06/07/17 0720 06/07/17 1150  GLUCAP 192* 143* 192* 267* 76       Signed:  Barton Dubois MD.  [facility] 06/07/2017, 1:22 PM

## 2017-06-14 DIAGNOSIS — J449 Chronic obstructive pulmonary disease, unspecified: Secondary | ICD-10-CM | POA: Diagnosis not present

## 2017-06-14 DIAGNOSIS — Z66 Do not resuscitate: Secondary | ICD-10-CM | POA: Diagnosis not present

## 2017-06-14 DIAGNOSIS — E119 Type 2 diabetes mellitus without complications: Secondary | ICD-10-CM | POA: Diagnosis not present

## 2017-06-14 DIAGNOSIS — Z09 Encounter for follow-up examination after completed treatment for conditions other than malignant neoplasm: Secondary | ICD-10-CM | POA: Diagnosis not present

## 2017-07-05 ENCOUNTER — Other Ambulatory Visit: Payer: Self-pay

## 2017-07-05 MED ORDER — FLUTICASONE-SALMETEROL 250-50 MCG/DOSE IN AEPB: 1.0000 | INHALATION_SPRAY | Freq: Two times a day (BID) | RESPIRATORY_TRACT | 0 refills | Status: AC

## 2017-08-28 ENCOUNTER — Ambulatory Visit: Payer: Medicare Other | Admitting: Internal Medicine

## 2017-08-28 ENCOUNTER — Telehealth: Payer: Self-pay | Admitting: Internal Medicine

## 2017-08-28 ENCOUNTER — Encounter: Payer: Self-pay | Admitting: Internal Medicine

## 2017-08-28 DIAGNOSIS — Z7189 Other specified counseling: Secondary | ICD-10-CM

## 2017-08-28 DIAGNOSIS — J449 Chronic obstructive pulmonary disease, unspecified: Secondary | ICD-10-CM | POA: Diagnosis not present

## 2017-08-28 DIAGNOSIS — J9622 Acute and chronic respiratory failure with hypercapnia: Secondary | ICD-10-CM | POA: Diagnosis not present

## 2017-08-28 DIAGNOSIS — J9621 Acute and chronic respiratory failure with hypoxia: Secondary | ICD-10-CM

## 2017-08-28 DIAGNOSIS — K219 Gastro-esophageal reflux disease without esophagitis: Secondary | ICD-10-CM | POA: Diagnosis not present

## 2017-08-28 MED ORDER — IPRATROPIUM-ALBUTEROL 0.5-2.5 (3) MG/3ML IN SOLN: 3.0000 mL | RESPIRATORY_TRACT | 12 refills | Status: AC | PRN

## 2017-08-28 MED ORDER — GLYCOPYRROLATE-FORMOTEROL 9-4.8 MCG/ACT IN AERO: 2.0000 | INHALATION_SPRAY | Freq: Two times a day (BID) | RESPIRATORY_TRACT | 0 refills | Status: AC

## 2017-08-28 NOTE — Telephone Encounter (Signed)
Patient's daughter called back and rescheduled appt for 2:00 pm today.

## 2017-08-28 NOTE — Telephone Encounter (Signed)
Ok noted  

## 2017-08-28 NOTE — Patient Instructions (Signed)
Sample Bevespi inhaler    Inhale 2 puffs, twice daily      Try this instead of Advair. When the Sample runs out, go back to Advair for comparison.   Please call as needed

## 2017-08-28 NOTE — Progress Notes (Signed)
Patient ID: [name], female    DOB: 1941/03/29, 76 y.o.   MRN: [id number]  HPI Female former smoker followed for COPD, chronic hypoxic respiratory failure, complicated by CAD/stents/MI, DM End of Life Discussion-reviewed options briefly. She would accept short-term intubation if we thought a problem was reversible, such as an acute pneumonia. I suggested she share her thoughts with her family so they would understand her wishes. I think she is pretty realistic and understands she has end-stage lung disease -------------------------------------------------------------------------------------------------------------------  02/20/17- 76 year old female former smoker followed for COPD, chronic hypoxic respiratory failure, complicated by CAD/stents/MI, DM 2 O2 1-2 liters/[facility] LOV-NP-12/15/16-    hospital follow-up  resolving acute exacerbation after hospital for COPD exacerbation. Daughter here with her today complains of feeling "weak and drowsy" but admits being depressed. Using nebulizer twice daily. Occasional green plug of mucus.  CXR 11/26/16- 1V- No active disease.  08/28/17- 76 year old female former smoker followed for COPD, chronic hypoxic respiratory failure, complicated by CAD/stents/MI, DM 2         HOSPICE O2 1-2 liters/[facility] Post hospital-Pt continues to remain weak. Uses O2 24/7.  Hospital 8/31-06/07/17 for acute on chronic respiratory failure/COPD exacerbation which responded to antibiotics, steroids, nebulizers.  A lung nodule was noted and patient reportedly not interested in further workup. Advair 250, albuterol neb, pro-air HFA Finishing doxycycline for recent bronchitis exacerbation.  Occasional heartburn.  Mentions intermittent food hanging up and burning sensation lower esophagus  at least the past month, maybe longer. CT images reviewed-small right middle lobe nodule discussed. CT chest 06/03/17 IMPRESSION: Advanced pulmonary emphysema pattern with hyperlucency  and hyperinflation. Associated mild chronic central bronchial wall thickening without occlusion or mucous plugging. Scattered parenchymal areas of subpleural atelectasis versus scarring No superimposed acute pneumonia, collapse, consolidation, or edema pattern 7 mm subpleural peripheral right middle lobe nodule. Non-contrast chest CT at 6-12 months is recommended. If the nodule is stable at time of repeat CT, then future CT at 18-24 months (from today's scan) is considered optional for low-risk patients, but is recommended for high-risk patients. This recommendation follows the consensus statement: Guidelines for Management of Incidental Pulmonary Nodules Detected on CT Images: From the Fleischner Society 2017; Radiology 2017; 284:228-243. Aortic Atherosclerosis (ICD10-I70.0) and Emphysema (ICD10-J43.9).  ROS-see HPI   Negative unless "+" Constitutional:    weight loss, night sweats, fevers, chills, fatigue, lassitude. HEENT:    headaches, difficulty swallowing, tooth/dental problems, sore throat,       sneezing, itching, ear ache, nasal congestion, post nasal drip, snoring CV:    +chest pain, orthopnea, PND, swelling in lower extremities, anasarca,                                                             dizziness, palpitations Resp:   +shortness of breath with exertion or at rest.                  productive cough,   +non-productive cough, coughing up of blood.              change in color of mucus.  wheezing.   Skin:    rash or lesions. GI:  +  heartburn, indigestion, abdominal pain, nausea, vomiting,  GU: . MS:   joint pain, stiffness, . Neuro-     nothing  unusual Psych:  change in mood or affect.  depression or anxiety.   memory loss.  OBJ- Physical Exam   O2 2-3L General- Alert, Oriented, Affect-appropriate, Distress- none acute+ Portable O2, wheelchair Skin-- rash-none, lesions- none, excoriation- none Lymphadenopathy- none Head- atraumatic            Eyes- Gross  vision intact, PERRLA, conjunctivae and secretions clear            Ears- Hearing, canals-normal            Nose- Clear, no-Septal dev, mucus, polyps, erosion, perforation             Throat- Mallampati II , mucosa clear , drainage- none, tonsils- atrophic Neck- flexible , trachea midline, no stridor , thyroid nl, carotid no bruit Chest - symmetrical excursion , unlabored           Heart/CV- RRR , no murmur , no gallop  , no rub, nl s1 s2                           - JVD- none , edema- none, stasis changes- none, varices- none           Lung-  + distant/clear to P&A /very distant, unlabored,  wheeze- none, cough  rattling-none, dullness-none, rub- none           Chest wall- no rub, no tenderness to light pressure Abd-  Br/ Gen/ Rectal- Not done, not indicated Extrem- cyanosis- none, clubbing, none, atrophy- none, strength- nl Neuro- grossly intact to observation

## 2017-08-29 ENCOUNTER — Telehealth: Payer: Self-pay | Admitting: Internal Medicine

## 2017-08-29 DIAGNOSIS — K219 Gastro-esophageal reflux disease without esophagitis: Secondary | ICD-10-CM | POA: Insufficient documentation

## 2017-08-29 DIAGNOSIS — K220 Achalasia of cardia: Secondary | ICD-10-CM

## 2017-08-29 NOTE — Telephone Encounter (Signed)
Routing to [place] per her request.

## 2017-08-29 NOTE — Assessment & Plan Note (Signed)
She is aware of heartburn is a chronic issue.  She has been noticing intermittent food hanging up at the LES level.  I coached her and her husband on the need to chew and swallow slowly and carefully to avoid hanging up.  She does not have obvious oral thrush today but has been on antibiotics and may develop esophageal yeast.  She would not probably tolerate upper endoscopy but I asked her to talk with her PCP about a barium swallow and possibly GI evaluation if appropriate.

## 2017-08-29 NOTE — Telephone Encounter (Signed)
Spoke with patient-she wanted [facility] to order Barium Swallow and GI referral. CY agreed and patient is aware that order and referral has been placed. Nothing more needed at this time.

## 2017-08-29 NOTE — Assessment & Plan Note (Signed)
Hospice status is appropriate.  She and her husband appear comfortable with this relationship.

## 2017-08-29 NOTE — Assessment & Plan Note (Addendum)
Resolving recent exacerbation.  I doubt inhaled steroid is helpful to her.  We will try a LABA/LAMA Bevespi. Plan-finish doxycycline

## 2017-08-29 NOTE — Assessment & Plan Note (Signed)
She will remain oxygen dependent.

## 2017-08-31 ENCOUNTER — Ambulatory Visit: Payer: Medicare Other | Admitting: Internal Medicine

## 2017-08-31 ENCOUNTER — Encounter (INDEPENDENT_AMBULATORY_CARE_PROVIDER_SITE_OTHER): Payer: Self-pay

## 2017-08-31 ENCOUNTER — Encounter: Payer: Self-pay | Admitting: Internal Medicine

## 2017-08-31 VITALS — BP 136/58 | HR 100 | Ht 61.0 in | Wt 114.4 lb

## 2017-08-31 DIAGNOSIS — K208 Other esophagitis without bleeding: Secondary | ICD-10-CM

## 2017-08-31 DIAGNOSIS — R1310 Dysphagia, unspecified: Secondary | ICD-10-CM

## 2017-08-31 MED ORDER — SUCRALFATE 1 GM/10ML PO SUSP: 1.0000 g | Freq: Three times a day (TID) | ORAL | 0 refills | Status: DC

## 2017-08-31 NOTE — Patient Instructions (Signed)
We have canceled your Barium swallow test for tomorrow.   We have sent the following medications to your pharmacy for you to pick up at your convenience: carafate  We are giving you a dysphagia diet , follow level # 3 please.   Dr Carlean Purl is going to talk with Dr Baird Lyons about your care.   I appreciate the opportunity to care for you. Silvano Rusk, MD, [facility]

## 2017-08-31 NOTE — Progress Notes (Signed)
Quinnley Colasurdo Chiu 76 y.o. 04-Jun-1941 [id number]  Assessment & Plan:   Encounter Diagnoses  Name Primary?  . Pill esophagitis due to doxycycline Yes  . Odynophagia    The history is very suggestive of the diagnosis I have given her.  We will go with that and treat with Carafate 4 times a day, and consider discontinuing the doxycycline versus finishing out the course.  She is noted that she is a bit better from when she saw Dr. Annamaria Boots earlier this week.  I will query Dr. Annamaria Boots about the antibiotics and he can make recommendations.  She is on prednisone so candidate is possible but my sense is that is not the case.  Should she fail to improve after finishing doxycycline we may need to investigate versus empiric treatment with fluconazole.  I would favor empiric treatment plus or minus a barium swallow but she is not a good candidate for endoscopic evaluation due to her terrible COPD.  Would reserve that for emergency type situations most likely or persistent problems that are a significant effect on her quality of life or health otherwise.  I think we can cancel the barium swallow though that was a rational order given his history and my impression I think we will work with this diagnosis and treatment plan.  Husband agrees.  Patient agrees.  She was advised to follow a dysphagia 3 diet, handout provided, sit upright and take only 1 pill at a time (she does this) and make sure she drinks copious water after medication.  I appreciate the opportunity to care for this patient. CC: Glendale Chard, MD Dr. 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  She has 4 days left of a second treatment course of doxycycline in recent weeks.  Sometime this week she noticed pain  in the upper epigastric area after swallowing and feeling like things hung there just a little bit and passed with water.  This is a new problem never had before.  She has not noticed any thrush.  She has modified her diet since seeing Dr. Annamaria Boots this week, and by eating smaller things things are not as bad.  She is very frail on oxygen and it is thought not someone who could tolerate sedation and endoscopy very well.  Dr. Annamaria Boots did order a barium swallow which is planned for tomorrow.  Husband participates in the history.  She does not describe a lot of heartburn or reflux.  She is on a twice daily PPI for some time now.  Allergies  Allergen Reactions  . Bactrim [Sulfamethoxazole-Trimethoprim] Nausea And Vomiting  . Codeine Nausea And Vomiting  . Vytorin [Ezetimibe-Simvastatin] Other (See Comments)    Malaise   Current Meds  Medication Sig  . albuterol (PROAIR HFA) 108 (90 Base) MCG/ACT inhaler Inhale 2 puffs into the lungs every 4 (four) hours as needed for wheezing or shortness of breath.  Marland Kitchen amitriptyline (ELAVIL) 10 MG tablet 1 daily at bedtime (Patient taking differently: Take 10 mg by mouth at bedtime. )  . Ascorbic Acid (VITAMIN C) 1000 MG tablet Take 1,000 mg by mouth daily.  Marland Kitchen aspirin EC 81 MG tablet Take 81 mg by mouth at bedtime.   . Biotin (BIOTIN MAXIMUM STRENGTH) 10 MG TABS Take 10 mg by mouth daily.   Marland Kitchen  Cholecalciferol (VITAMIN D) 2000 units tablet Take 2,000 Units by mouth daily.  . clonazePAM (KLONOPIN) 1 MG tablet Take 1 tablet (1 mg total) by mouth at bedtime.  Marland Kitchen doxycycline (VIBRA-TABS) 100 MG tablet Take 100 mg by mouth 2 (two) times daily.  . famotidine (PEPCID) 20 MG tablet Take 1 tablet (20 mg total) by mouth daily.  . fluticasone (FLONASE) 50 MCG/ACT nasal spray Place 2 sprays into both nostrils daily as needed for allergies or rhinitis.  . Fluticasone-Salmeterol (ADVAIR DISKUS) 250-50 MCG/DOSE AEPB Inhale 1 puff into the lungs 2 (two) times daily.  . furosemide (LASIX)  20 MG tablet Take 1 tablet (20 mg total) by mouth daily.  Marland Kitchen gabapentin (NEURONTIN) 100 MG capsule Take 1 capsule (100 mg total) by mouth 3 (three) times daily as needed (pain). (Patient taking differently: Take 100 mg by mouth at bedtime as needed (for nerve pain). )  . Glycopyrrolate-Formoterol (BEVESPI AEROSPHERE) 9-4.8 MCG/ACT AERO Inhale 2 puffs into the lungs 2 (two) times daily.  Marland Kitchen guaiFENesin (MUCINEX) 600 MG 12 hr tablet Take 1 tablet (600 mg total) by mouth 2 (two) times daily.  Marland Kitchen ipratropium-albuterol (DUONEB) 0.5-2.5 (3) MG/3ML SOLN Take 3 mLs by nebulization every 4 (four) hours as needed.  . Melatonin 5 MG TABS Take 5 mg by mouth at bedtime as needed (for sleep).   . metFORMIN (GLUCOPHAGE-XR) 500 MG 24 hr tablet Take 500-1,000 mg by mouth See admin instructions. 500 mg in the morning and 1,000 mg in the evening  . Morphine Sulfate (MORPHINE CONCENTRATE) 10 MG/0.5ML SOLN concentrated solution Take 0.13 mLs (2.6 mg total) by mouth every 6 (six) hours as needed for shortness of breath (for dyspnea.).  Marland Kitchen Multiple Vitamin (MULTIVITAMIN WITH MINERALS) TABS tablet Take 1 tablet by mouth daily.  . naproxen sodium (ALEVE) 220 MG tablet Take 220-440 mg by mouth 2 (two) times daily as needed (for pain).  . ondansetron (ZOFRAN) 4 MG tablet Take 1 tablet (4 mg total) by mouth every 8 (eight) hours as needed for nausea or vomiting.  . OXYGEN Inhale 2.5-3 L into the lungs continuous.   . pantoprazole (PROTONIX) 40 MG tablet Take 1 tablet (40 mg total) by mouth 2 (two) times daily before a meal.  . senna-docusate (SENOKOT-S) 8.6-50 MG tablet Take 1 tablet by mouth at bedtime as needed for mild constipation.   Past Medical History:  Diagnosis Date  . CAD (coronary artery disease)    Stent RCA 2005, stent LAD 2006  . Carotid artery occlusion   . COPD (chronic obstructive pulmonary disease) ([place])   . Depression   . Diabetes mellitus without complication ([place])   . Fatigue   . Heart murmur   .  Hyperlipemia   . Hypertension   . Myocardial infarction ([place])   . Recurrent upper respiratory infection (URI)   . Shortness of breath   . Tobacco abuse    Past Surgical History:  Procedure Laterality Date  . Bladder tack    . CARDIAC CATHETERIZATION  2011  . coronary angiography  Nov 25, 2004   CYPHER stenting, left anterior descending  . CORONARY STENT PLACEMENT     drug-eluting; right coronary artery  . VAGINAL HYSTERECTOMY    . vaginal loop     Social History   Social History Narrative   Husband smokes cigars   family history includes Alzheimer's disease in her father; COPD in her father; Cerebral aneurysm in her sister; Diabetes in her mother and sister; Heart attack in her  brother and sister; Heart disease in her mother.   Review of Systems Somewhat weak and frail wheelchair-bound much of the time on chronic oxygen.  Chronic dyspnea.  Still coughing up some green sputum.  Improved from where she was.  Very cold natured.  All other review of systems as above or as per HPI.  Objective:   Physical Exam @BP  (!) 136/58 (BP Location: Left Arm, Patient Position: Sitting, Cuff Size: Normal)   Pulse 100   Ht 5\' 1"  (1.549 m) Comment: pt reported-wheelchair  Wt 114 lb 6 oz (51.9 kg) Comment: pt reported-wheelchair  BMI 21.61 kg/m @  General:  Frail chronically ill on O2 Eyes:  anicteric. ENT:   Mouth and posterior pharynx free of lesions. Dentures intact No thrush Neck:   supple w/o thyromegaly or mass.  Lungs: Diffusely decreased breath sounds Heart:   distantS1S2, no rubs, murmurs, gallops. Abdomen:  soft, slightly tender epigastrium,  Lymph:  no cervical or supraclavicular adenopathy. Neuro:  A&O x 3.  Psych:  appropriate mood and  Affect.   Data Reviewed: Pulmonary note 08/28/2017 Hospital discharge summary of 06/07/2017

## 2017-09-01 ENCOUNTER — Telehealth: Payer: Self-pay | Admitting: Internal Medicine

## 2017-09-01 ENCOUNTER — Ambulatory Visit (HOSPITAL_COMMUNITY): Payer: Medicare Other

## 2017-09-01 MED ORDER — SUCRALFATE 1 G PO TABS: 1.0000 g | ORAL_TABLET | Freq: Three times a day (TID) | ORAL | 0 refills | Status: DC

## 2017-09-01 NOTE — Telephone Encounter (Signed)
Change it to the pills and see

## 2017-09-01 NOTE — Telephone Encounter (Signed)
Please advise Sir, thanks. 

## 2017-09-01 NOTE — Telephone Encounter (Signed)
I spoke with [name] at [facility] and changed the rx to carafate pills which ran thru for a month's worth at $8.00.  I called and informed her daughter [name] of this and told her that Mom can crush up the carafate pill in 2 tablespoons of water and take it per Dr Carlean Purl.

## 2017-09-07 ENCOUNTER — Telehealth: Payer: Self-pay | Admitting: Internal Medicine

## 2017-09-07 NOTE — Telephone Encounter (Signed)
CY can pt have samples of Bevespi  Current Outpatient Medications on File Prior to Visit  Medication Sig Dispense Refill  . albuterol (PROAIR HFA) 108 (90 Base) MCG/ACT inhaler Inhale 2 puffs into the lungs every 4 (four) hours as needed for wheezing or shortness of breath. 1 Inhaler 11  . amitriptyline (ELAVIL) 10 MG tablet 1 daily at bedtime (Patient taking differently: Take 10 mg by mouth at bedtime. ) 30 tablet 0  . Ascorbic Acid (VITAMIN C) 1000 MG tablet Take 1,000 mg by mouth daily.    Marland Kitchen aspirin EC 81 MG tablet Take 81 mg by mouth at bedtime.     . Biotin (BIOTIN MAXIMUM STRENGTH) 10 MG TABS Take 10 mg by mouth daily.     . Cholecalciferol (VITAMIN D) 2000 units tablet Take 2,000 Units by mouth daily.    . clonazePAM (KLONOPIN) 1 MG tablet Take 1 tablet (1 mg total) by mouth at bedtime. 10 tablet 0  . doxycycline (VIBRA-TABS) 100 MG tablet Take 100 mg by mouth 2 (two) times daily.  0  . famotidine (PEPCID) 20 MG tablet Take 1 tablet (20 mg total) by mouth daily. 30 tablet 0  . fluticasone (FLONASE) 50 MCG/ACT nasal spray Place 2 sprays into both nostrils daily as needed for allergies or rhinitis.    . Fluticasone-Salmeterol (ADVAIR DISKUS) 250-50 MCG/DOSE AEPB Inhale 1 puff into the lungs 2 (two) times daily. 180 each 0  . furosemide (LASIX) 20 MG tablet Take 1 tablet (20 mg total) by mouth daily. 30 tablet 1  . gabapentin (NEURONTIN) 100 MG capsule Take 1 capsule (100 mg total) by mouth 3 (three) times daily as needed (pain). (Patient taking differently: Take 100 mg by mouth at bedtime as needed (for nerve pain). ) 90 capsule 0  . Glycopyrrolate-Formoterol (BEVESPI AEROSPHERE) 9-4.8 MCG/ACT AERO Inhale 2 puffs into the lungs 2 (two) times daily. 1 Inhaler 0  . guaiFENesin (MUCINEX) 600 MG 12 hr tablet Take 1 tablet (600 mg total) by mouth 2 (two) times daily.    Marland Kitchen ipratropium-albuterol (DUONEB) 0.5-2.5 (3) MG/3ML SOLN Take 3 mLs by nebulization every 4 (four) hours as needed. 360 mL 12  .  Melatonin 5 MG TABS Take 5 mg by mouth at bedtime as needed (for sleep).     . metFORMIN (GLUCOPHAGE-XR) 500 MG 24 hr tablet Take 500-1,000 mg by mouth See admin instructions. 500 mg in the morning and 1,000 mg in the evening  0  . Morphine Sulfate (MORPHINE CONCENTRATE) 10 MG/0.5ML SOLN concentrated solution Take 0.13 mLs (2.6 mg total) by mouth every 6 (six) hours as needed for shortness of breath (for dyspnea.). 30 mL 0  . Multiple Vitamin (MULTIVITAMIN WITH MINERALS) TABS tablet Take 1 tablet by mouth daily.    . naproxen sodium (ALEVE) 220 MG tablet Take 220-440 mg by mouth 2 (two) times daily as needed (for pain).    . nitroGLYCERIN (NITROSTAT) 0.4 MG SL tablet Place 1 tablet (0.4 mg total) under the tongue every 5 (five) minutes as needed for chest pain. (Patient not taking: Reported on 08/31/2017) 25 tablet 6  . ondansetron (ZOFRAN) 4 MG tablet Take 1 tablet (4 mg total) by mouth every 8 (eight) hours as needed for nausea or vomiting. 15 tablet 0  . OXYGEN Inhale 2.5-3 L into the lungs continuous.     . pantoprazole (PROTONIX) 40 MG tablet Take 1 tablet (40 mg total) by mouth 2 (two) times daily before a meal. 60 tablet 1  .  senna-docusate (SENOKOT-S) 8.6-50 MG tablet Take 1 tablet by mouth at bedtime as needed for mild constipation. 15 tablet 0  . sucralfate (CARAFATE) 1 g tablet Take 1 tablet (1 g total) by mouth 4 (four) times daily -  with meals and at bedtime. 120 tablet 0   No current facility-administered medications on file prior to visit.    Allergies  Allergen Reactions  . Bactrim [Sulfamethoxazole-Trimethoprim] Nausea And Vomiting  . Codeine Nausea And Vomiting  . Vytorin [Ezetimibe-Simvastatin] Other (See Comments)    Malaise

## 2017-09-07 NOTE — Telephone Encounter (Signed)
Ok 3 samples Bevespi if we have enough

## 2017-09-07 NOTE — Telephone Encounter (Signed)
Advised daughter samples are ready to be picked up. Nothing further is needed.

## 2017-09-25 ENCOUNTER — Other Ambulatory Visit: Payer: Self-pay

## 2017-09-25 MED ORDER — SUCRALFATE 1 G PO TABS: 1.0000 g | ORAL_TABLET | Freq: Three times a day (TID) | ORAL | 0 refills | Status: AC

## 2017-09-25 NOTE — Telephone Encounter (Signed)
90 day supply sent in for patients sucralfate.

## 2017-09-28 DIAGNOSIS — E119 Type 2 diabetes mellitus without complications: Secondary | ICD-10-CM | POA: Diagnosis not present

## 2017-09-28 DIAGNOSIS — H25811 Combined forms of age-related cataract, right eye: Secondary | ICD-10-CM | POA: Diagnosis not present

## 2017-09-28 DIAGNOSIS — H25812 Combined forms of age-related cataract, left eye: Secondary | ICD-10-CM | POA: Diagnosis not present

## 2017-10-19 ENCOUNTER — Ambulatory Visit: Payer: Medicare Other | Admitting: Pulmonary Disease

## 2017-11-03 DEATH — deceased

## 2017-12-04 ENCOUNTER — Ambulatory Visit: Payer: Medicare Other | Admitting: Internal Medicine

## 2018-06-09 IMAGING — DX DG CHEST 1V PORT
1 series · 1 of 1 positions shown · non-contrast
Comparison: 06/14/2016

CLINICAL DATA: Shortness of breath

EXAM:
PORTABLE CHEST 1 VIEW

[chest ap]
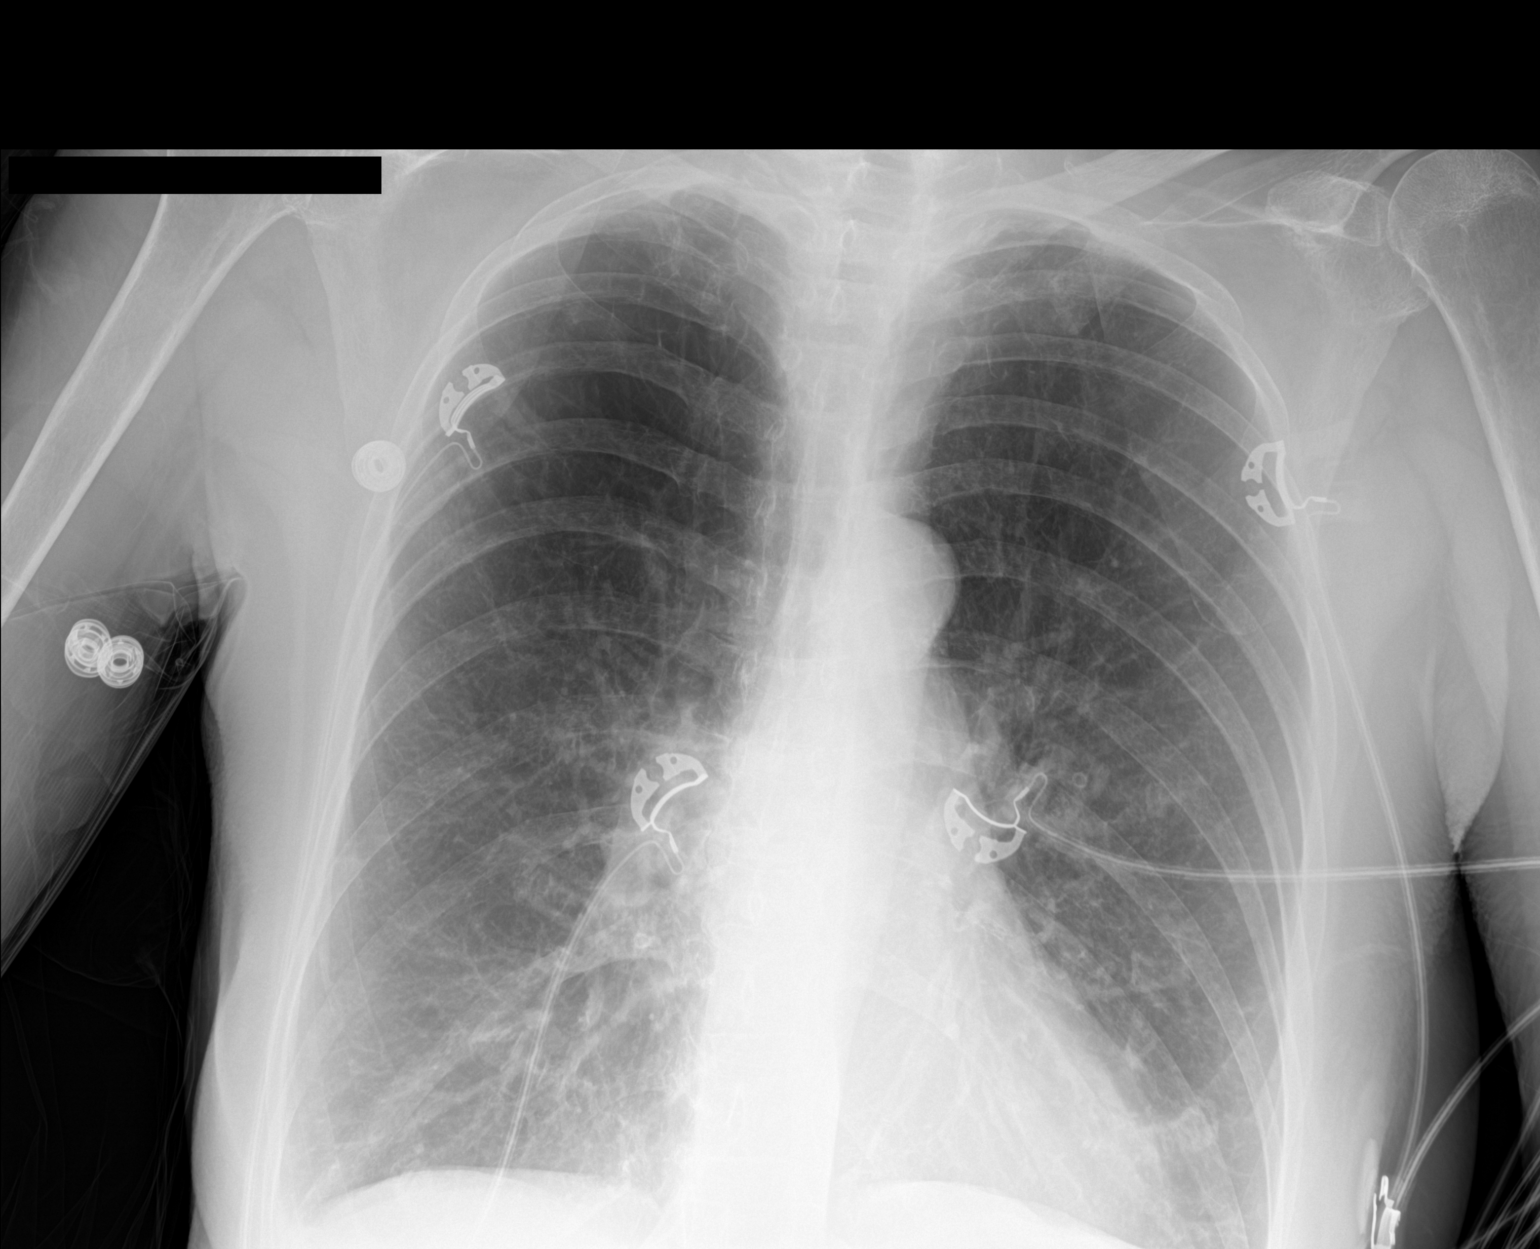

[1 of 1 positions shown; findings below may reference images not displayed]

FINDINGS: Hyperinflation with emphysematous disease. No focal consolidation or
effusion. Coarse interstitial opacities at the lung bases likely
chronic. Normal heart size. No pneumothorax.
IMPRESSION: Hyperinflation with emphysematous disease. No acute pulmonary
infiltrate
